# Patient Record
Sex: Male | Born: 1965 | Race: White | Hispanic: No | Marital: Single
Health system: Midwestern US, Community
[De-identification: ages and names within clinical notes are randomized; demographics above are authoritative.]

## PROBLEM LIST (undated history)

## (undated) DIAGNOSIS — G250 Essential tremor: Secondary | ICD-10-CM

## (undated) DIAGNOSIS — R5382 Chronic fatigue, unspecified: Secondary | ICD-10-CM

## (undated) DIAGNOSIS — C6290 Malignant neoplasm of unspecified testis, unspecified whether descended or undescended: Secondary | ICD-10-CM

## (undated) DIAGNOSIS — F419 Anxiety disorder, unspecified: Secondary | ICD-10-CM

## (undated) DIAGNOSIS — F32A Depression, unspecified: Secondary | ICD-10-CM

## (undated) DIAGNOSIS — K828 Other specified diseases of gallbladder: Secondary | ICD-10-CM

## (undated) DIAGNOSIS — F329 Major depressive disorder, single episode, unspecified: Secondary | ICD-10-CM

## (undated) DIAGNOSIS — K219 Gastro-esophageal reflux disease without esophagitis: Secondary | ICD-10-CM

## (undated) DIAGNOSIS — R42 Dizziness and giddiness: Secondary | ICD-10-CM

## (undated) DIAGNOSIS — G479 Sleep disorder, unspecified: Secondary | ICD-10-CM

## (undated) DIAGNOSIS — E038 Other specified hypothyroidism: Secondary | ICD-10-CM

## (undated) DIAGNOSIS — N186 End stage renal disease: Secondary | ICD-10-CM

## (undated) DIAGNOSIS — Z992 Dependence on renal dialysis: Secondary | ICD-10-CM

## (undated) DIAGNOSIS — J90 Pleural effusion, not elsewhere classified: Secondary | ICD-10-CM

## (undated) DIAGNOSIS — R748 Abnormal levels of other serum enzymes: Secondary | ICD-10-CM

## (undated) DIAGNOSIS — E034 Atrophy of thyroid (acquired): Secondary | ICD-10-CM

## (undated) DIAGNOSIS — G929 Unspecified toxic encephalopathy: Secondary | ICD-10-CM

## (undated) DIAGNOSIS — D631 Anemia in chronic kidney disease: Secondary | ICD-10-CM

## (undated) HISTORY — DX: Depression, unspecified: F32.A

## (undated) HISTORY — DX: Malignant neoplasm of unspecified testis, unspecified whether descended or undescended: C62.90

## (undated) HISTORY — DX: Major depressive disorder, single episode, unspecified: F32.9

## (undated) HISTORY — DX: Gastro-esophageal reflux disease without esophagitis: K21.9

## (undated) HISTORY — DX: Chronic fatigue, unspecified: R53.82

## (undated) HISTORY — DX: Anxiety disorder, unspecified: F41.9

## (undated) HISTORY — DX: Essential tremor: G25.0

---
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
## 2000-08-15 HISTORY — PX: ORCHIECTOMY: SHX2116

## 2000-09-05 ENCOUNTER — Encounter: Payer: Self-pay | Admitting: Urology

## 2000-09-07 ENCOUNTER — Ambulatory Visit (HOSPITAL_COMMUNITY): Admission: RE | Admit: 2000-09-07 | Discharge: 2000-09-07 | Payer: Self-pay | Admitting: Urology

## 2000-09-07 ENCOUNTER — Encounter (INDEPENDENT_AMBULATORY_CARE_PROVIDER_SITE_OTHER): Payer: Self-pay | Admitting: Specialist

## 2000-09-11 ENCOUNTER — Encounter: Admission: RE | Admit: 2000-09-11 | Discharge: 2000-09-11 | Payer: Self-pay | Admitting: Urology

## 2000-09-11 ENCOUNTER — Encounter: Payer: Self-pay | Admitting: Urology

## 2000-09-19 ENCOUNTER — Encounter: Payer: Self-pay | Admitting: Hematology & Oncology

## 2000-09-19 ENCOUNTER — Inpatient Hospital Stay (HOSPITAL_COMMUNITY): Admission: AD | Admit: 2000-09-19 | Discharge: 2000-09-28 | Payer: Self-pay | Admitting: Hematology & Oncology

## 2000-09-20 ENCOUNTER — Encounter: Payer: Self-pay | Admitting: Hematology & Oncology

## 2000-10-09 ENCOUNTER — Inpatient Hospital Stay (HOSPITAL_COMMUNITY): Admission: AD | Admit: 2000-10-09 | Discharge: 2000-10-14 | Payer: Self-pay | Admitting: Hematology & Oncology

## 2000-10-09 ENCOUNTER — Encounter: Payer: Self-pay | Admitting: Hematology & Oncology

## 2000-10-24 ENCOUNTER — Encounter: Payer: Self-pay | Admitting: Hematology & Oncology

## 2000-10-24 ENCOUNTER — Encounter: Admission: RE | Admit: 2000-10-24 | Discharge: 2000-10-24 | Payer: Self-pay | Admitting: Hematology & Oncology

## 2000-11-13 ENCOUNTER — Encounter: Admission: RE | Admit: 2000-11-13 | Discharge: 2000-11-13 | Payer: Self-pay | Admitting: Hematology & Oncology

## 2000-11-13 ENCOUNTER — Encounter: Payer: Self-pay | Admitting: Hematology & Oncology

## 2000-12-06 ENCOUNTER — Encounter: Admission: RE | Admit: 2000-12-06 | Discharge: 2000-12-06 | Payer: Self-pay | Admitting: Diagnostic Radiology

## 2000-12-14 ENCOUNTER — Encounter: Payer: Self-pay | Admitting: Hematology & Oncology

## 2000-12-14 ENCOUNTER — Encounter: Admission: RE | Admit: 2000-12-14 | Discharge: 2000-12-14 | Payer: Self-pay | Admitting: Hematology & Oncology

## 2001-01-17 ENCOUNTER — Ambulatory Visit (HOSPITAL_COMMUNITY): Admission: RE | Admit: 2001-01-17 | Discharge: 2001-01-17 | Payer: Self-pay | Admitting: Hematology & Oncology

## 2001-01-17 ENCOUNTER — Encounter: Payer: Self-pay | Admitting: Hematology & Oncology

## 2001-02-07 ENCOUNTER — Encounter: Admission: RE | Admit: 2001-02-07 | Discharge: 2001-02-07 | Payer: Self-pay | Admitting: Hematology & Oncology

## 2001-02-07 ENCOUNTER — Encounter: Payer: Self-pay | Admitting: Hematology & Oncology

## 2001-04-09 ENCOUNTER — Encounter: Admission: RE | Admit: 2001-04-09 | Discharge: 2001-04-09 | Payer: Self-pay | Admitting: Family Medicine

## 2001-04-09 ENCOUNTER — Encounter: Payer: Self-pay | Admitting: Hematology & Oncology

## 2001-06-19 ENCOUNTER — Encounter: Payer: Self-pay | Admitting: Hematology & Oncology

## 2001-06-19 ENCOUNTER — Encounter: Admission: RE | Admit: 2001-06-19 | Discharge: 2001-06-19 | Payer: Self-pay | Admitting: Hematology & Oncology

## 2001-09-26 ENCOUNTER — Encounter: Admission: RE | Admit: 2001-09-26 | Discharge: 2001-09-26 | Payer: Self-pay | Admitting: Hematology & Oncology

## 2001-09-26 ENCOUNTER — Encounter: Payer: Self-pay | Admitting: Hematology & Oncology

## 2001-11-14 ENCOUNTER — Encounter: Payer: Self-pay | Admitting: Hematology & Oncology

## 2001-11-14 ENCOUNTER — Encounter: Admission: RE | Admit: 2001-11-14 | Discharge: 2001-11-14 | Payer: Self-pay | Admitting: Hematology & Oncology

## 2002-02-12 ENCOUNTER — Encounter: Admission: RE | Admit: 2002-02-12 | Discharge: 2002-02-12 | Payer: Self-pay | Admitting: Hematology & Oncology

## 2002-02-12 ENCOUNTER — Encounter: Payer: Self-pay | Admitting: Hematology & Oncology

## 2002-05-13 ENCOUNTER — Encounter: Payer: Self-pay | Admitting: Hematology & Oncology

## 2002-05-13 ENCOUNTER — Encounter: Admission: RE | Admit: 2002-05-13 | Discharge: 2002-05-13 | Payer: Self-pay | Admitting: Hematology & Oncology

## 2002-09-24 ENCOUNTER — Encounter: Admission: RE | Admit: 2002-09-24 | Discharge: 2002-09-24 | Payer: Self-pay | Admitting: Hematology & Oncology

## 2002-09-24 ENCOUNTER — Encounter: Payer: Self-pay | Admitting: Hematology & Oncology

## 2003-01-21 ENCOUNTER — Encounter: Payer: Self-pay | Admitting: Hematology & Oncology

## 2003-01-21 ENCOUNTER — Encounter: Admission: RE | Admit: 2003-01-21 | Discharge: 2003-01-21 | Payer: Self-pay | Admitting: Hematology & Oncology

## 2003-07-29 ENCOUNTER — Encounter: Admission: RE | Admit: 2003-07-29 | Discharge: 2003-07-29 | Payer: Self-pay | Admitting: Hematology & Oncology

## 2003-08-09 ENCOUNTER — Emergency Department (HOSPITAL_COMMUNITY): Admission: EM | Admit: 2003-08-09 | Discharge: 2003-08-10 | Payer: Self-pay | Admitting: Emergency Medicine

## 2004-01-27 ENCOUNTER — Encounter: Admission: RE | Admit: 2004-01-27 | Discharge: 2004-01-27 | Payer: Self-pay | Admitting: Hematology & Oncology

## 2004-07-28 ENCOUNTER — Ambulatory Visit: Payer: Self-pay | Admitting: Hematology & Oncology

## 2004-07-29 ENCOUNTER — Ambulatory Visit (HOSPITAL_COMMUNITY): Admission: RE | Admit: 2004-07-29 | Discharge: 2004-07-29 | Payer: Self-pay | Admitting: Hematology & Oncology

## 2004-09-30 ENCOUNTER — Ambulatory Visit: Payer: Self-pay | Admitting: Hematology & Oncology

## 2004-10-05 ENCOUNTER — Ambulatory Visit (HOSPITAL_COMMUNITY): Admission: RE | Admit: 2004-10-05 | Discharge: 2004-10-05 | Payer: Self-pay | Admitting: Hematology & Oncology

## 2005-02-01 ENCOUNTER — Ambulatory Visit: Payer: Self-pay | Admitting: Hematology & Oncology

## 2005-02-02 ENCOUNTER — Ambulatory Visit (HOSPITAL_COMMUNITY): Admission: RE | Admit: 2005-02-02 | Discharge: 2005-02-02 | Payer: Self-pay | Admitting: Hematology & Oncology

## 2005-05-27 ENCOUNTER — Emergency Department (HOSPITAL_COMMUNITY): Admission: EM | Admit: 2005-05-27 | Discharge: 2005-05-27 | Payer: Self-pay | Admitting: Emergency Medicine

## 2005-08-02 ENCOUNTER — Ambulatory Visit: Payer: Self-pay | Admitting: Hematology & Oncology

## 2005-08-09 ENCOUNTER — Ambulatory Visit (HOSPITAL_COMMUNITY): Admission: RE | Admit: 2005-08-09 | Discharge: 2005-08-09 | Payer: Self-pay | Admitting: Hematology & Oncology

## 2005-11-15 ENCOUNTER — Ambulatory Visit: Payer: Self-pay | Admitting: Internal Medicine

## 2005-12-12 ENCOUNTER — Ambulatory Visit: Payer: Self-pay | Admitting: Internal Medicine

## 2005-12-15 ENCOUNTER — Ambulatory Visit: Payer: Self-pay | Admitting: Internal Medicine

## 2005-12-19 ENCOUNTER — Ambulatory Visit: Payer: Self-pay | Admitting: Internal Medicine

## 2005-12-22 ENCOUNTER — Ambulatory Visit: Payer: Self-pay | Admitting: Internal Medicine

## 2005-12-29 ENCOUNTER — Ambulatory Visit: Payer: Self-pay | Admitting: Internal Medicine

## 2006-02-08 ENCOUNTER — Ambulatory Visit: Payer: Self-pay | Admitting: Hematology & Oncology

## 2006-02-16 LAB — CBC WITH DIFFERENTIAL/PLATELET
BASO%: 0.6 % (ref 0.0–2.0)
EOS%: 1.2 % (ref 0.0–7.0)
HCT: 43.6 % (ref 38.7–49.9)
MCHC: 34.7 g/dL (ref 32.0–35.9)
MONO#: 0.4 10*3/uL (ref 0.1–0.9)
RBC: 4.67 10*6/uL (ref 4.20–5.71)
RDW: 14.2 % (ref 11.2–14.6)
WBC: 3.1 10*3/uL — ABNORMAL LOW (ref 4.0–10.0)
lymph#: 0.7 10*3/uL — ABNORMAL LOW (ref 0.9–3.3)

## 2006-02-18 LAB — COMPREHENSIVE METABOLIC PANEL
ALT: 24 U/L (ref 0–40)
AST: 19 U/L (ref 0–37)
CO2: 24 mEq/L (ref 19–32)
Calcium: 9.3 mg/dL (ref 8.4–10.5)
Chloride: 102 mEq/L (ref 96–112)
Potassium: 4.1 mEq/L (ref 3.5–5.3)
Sodium: 137 mEq/L (ref 135–145)
Total Protein: 7.1 g/dL (ref 6.0–8.3)

## 2006-02-18 LAB — LACTATE DEHYDROGENASE: LDH: 159 U/L (ref 94–250)

## 2006-02-20 ENCOUNTER — Ambulatory Visit (HOSPITAL_COMMUNITY): Admission: RE | Admit: 2006-02-20 | Discharge: 2006-02-20 | Payer: Self-pay | Admitting: Hematology & Oncology

## 2006-07-22 IMAGING — CT CT CHEST W/ CM
1 of 4 series · 14 of 30 positions shown, 18 images · IV contrast (omnipaque)
Comparison: 08/09/2005

CHEST CT WITH CONTRAST

CLINICAL DATA: Testicular cancer
TECHNIQUE: Multidetector CT imaging of the chest, abdomen, and pelvis was
performed following the standard protocol during bolus administration of
intravenous contrast.

Contrast:  125 cc Omnipaque 300

[Series 2: cap 5.0 b40f st · axial · 0.76mm/px · z∈[-634,-24]mm · 14 of 144 slices shown, 18 images]
[im 11/144  mediastinal]
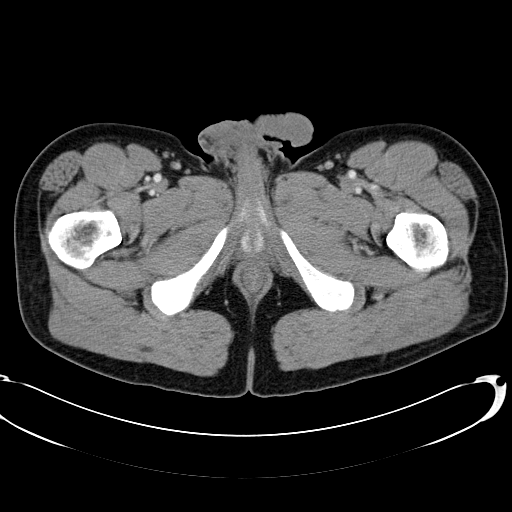
[im 11/144  lung]
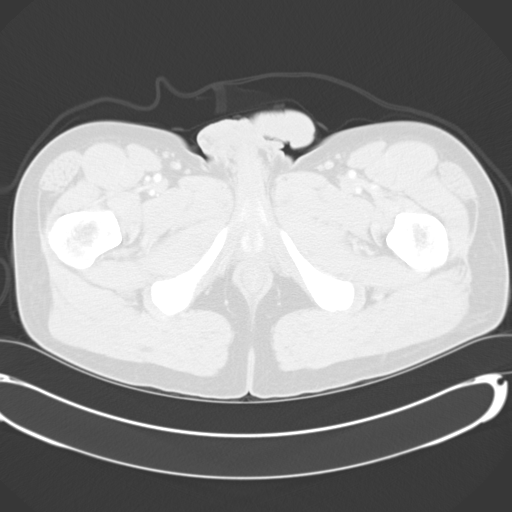
[im 21/144  lung]
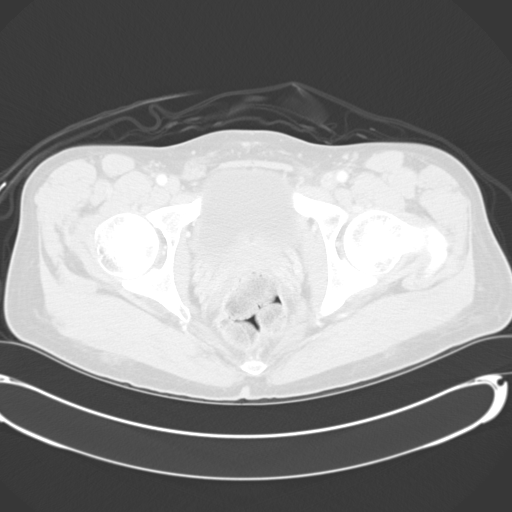
[im 31/144  lung]
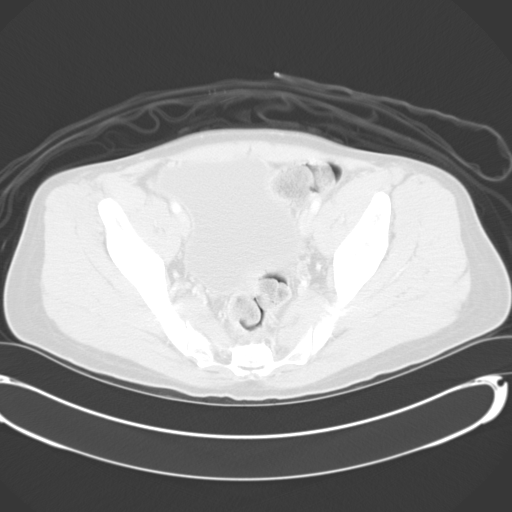
[im 41/144  lung]
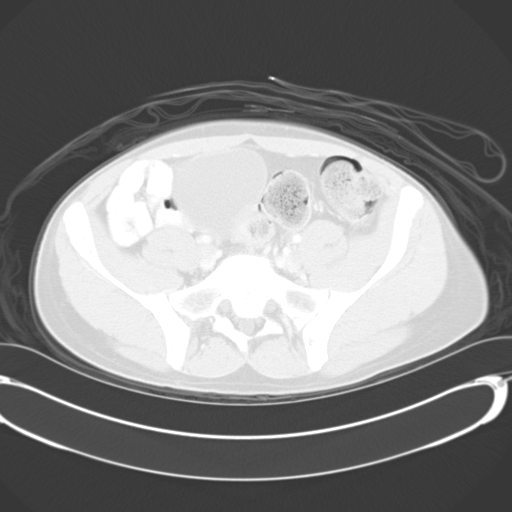
[im 52/144  mediastinal]
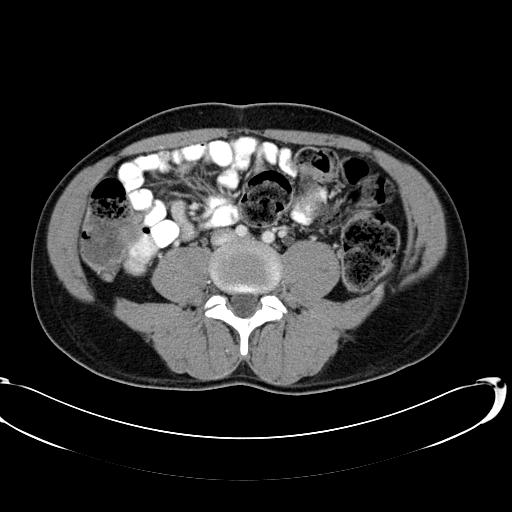
[im 52/144  lung]
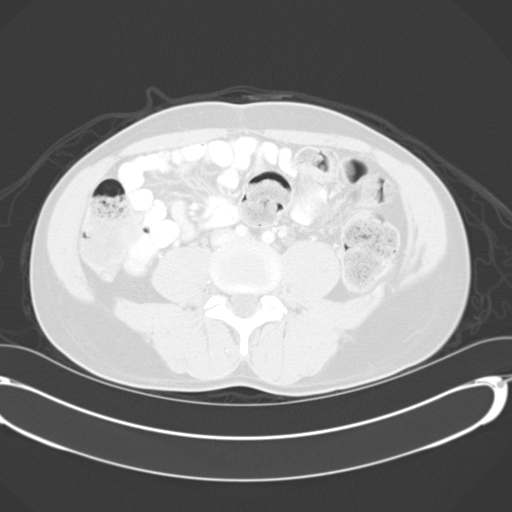
[im 62/144  lung]
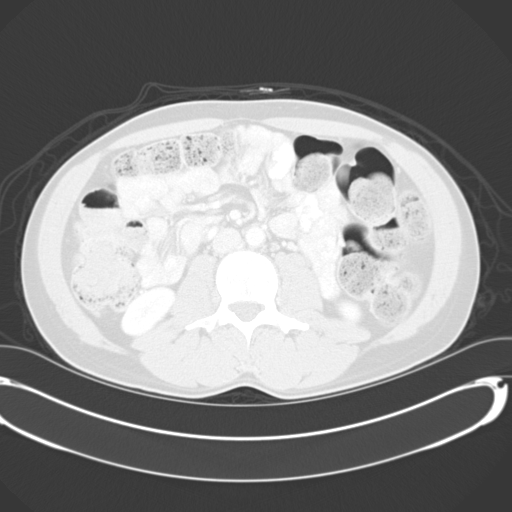
[im 71/144  lung]
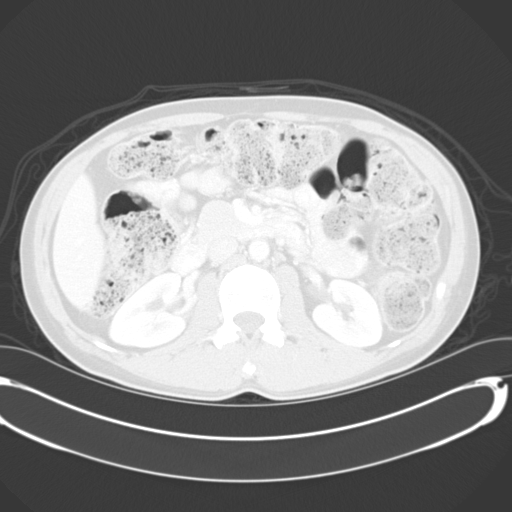
[im 72/144  lung]
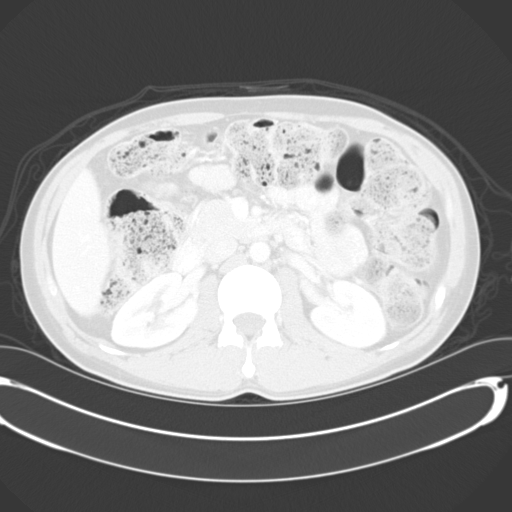
[im 82/144  mediastinal]
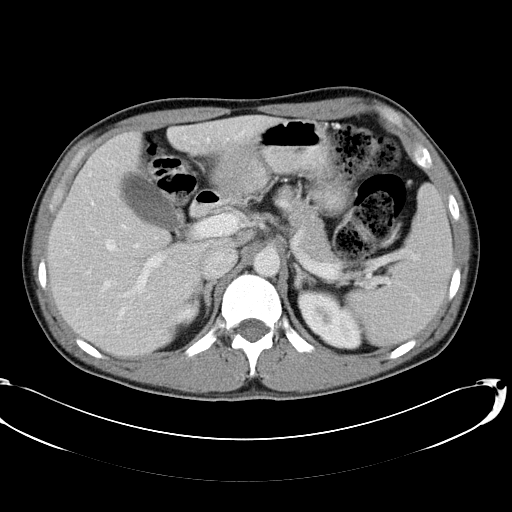
[im 82/144  lung]
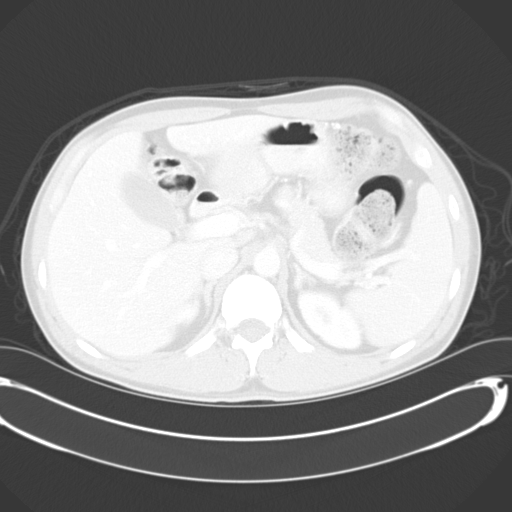
[im 92/144  lung]
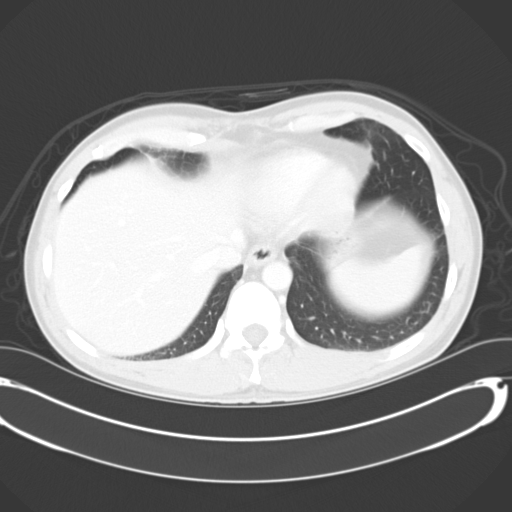
[im 103/144  lung]
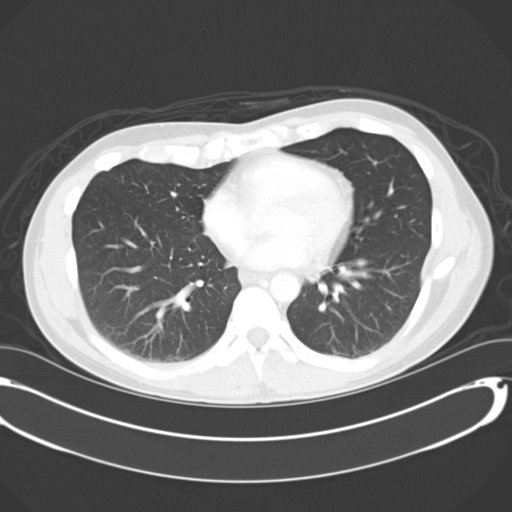
[im 113/144  lung]
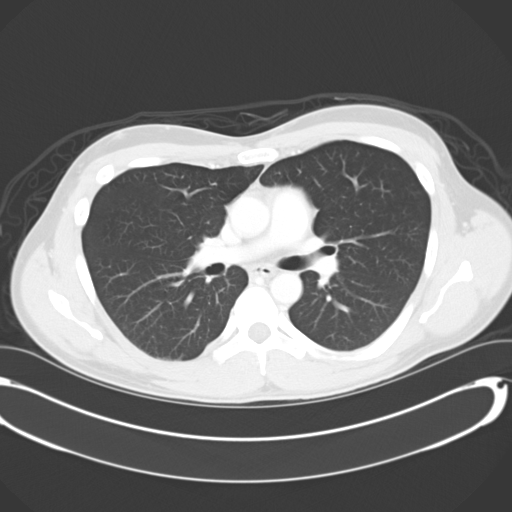
[im 123/144  mediastinal]
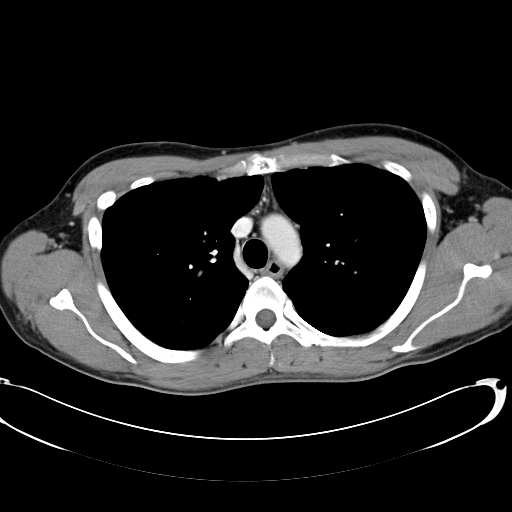
[im 123/144  lung]
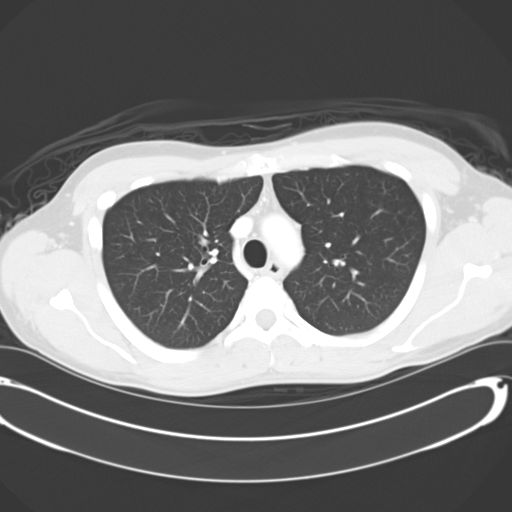
[im 133/144  lung]
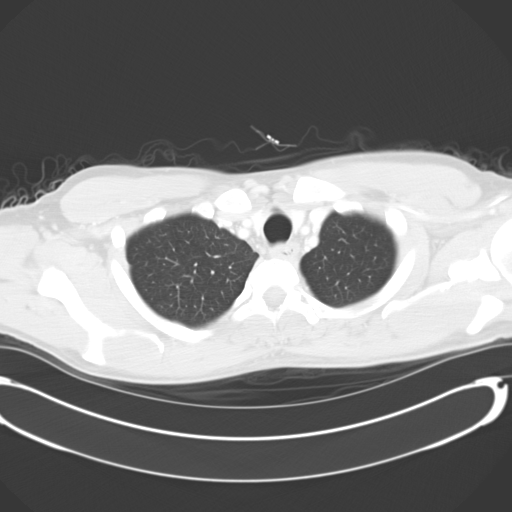

[14 of 30 positions shown; findings below may reference images not displayed]

FINDINGS: The lungs are clear. No suspicious pulmonary nodules or masses. No
effusions. No mediastinal, hilar, or axillary adenopathy. Heart and great
vessels are normal size and anatomic configuration. Visualized skeleton
unremarkable.

IMPRESSION

No acute findings. No evidence of metastatic disease.

ABDOMEN CT WITH CONTRAST
FINDINGS: Liver, spleen, pancreas, adrenals, kidneys, gallbladder are
unremarkable. Constipation noted. Bowel otherwise grossly unremarkable. No
adenopathy, free fluid, or free air. Visualized skeleton unremarkable.
Incidentally noted is a circumaortic left renal vein.

IMPRESSION

No acute findings or evidence of metastatic disease.

PELVIS CT WITH CONTRAST
FINDINGS: Constipation. Bladder mildly distended. No free fluid, free air, or
adenopathy. Bony structures unremarkable.

IMPRESSION

No acute findings or evidence of metastatic disease.

## 2006-08-21 ENCOUNTER — Ambulatory Visit: Payer: Self-pay | Admitting: Hematology & Oncology

## 2006-08-23 LAB — CBC WITH DIFFERENTIAL/PLATELET
Eosinophils Absolute: 0.1 10*3/uL (ref 0.0–0.5)
HCT: 43.1 % (ref 38.7–49.9)
LYMPH%: 27.8 % (ref 14.0–48.0)
MCHC: 34.8 g/dL (ref 32.0–35.9)
MCV: 91.9 fL (ref 81.6–98.0)
MONO#: 0.4 10*3/uL (ref 0.1–0.9)
MONO%: 11.4 % (ref 0.0–13.0)
NEUT#: 1.9 10*3/uL (ref 1.5–6.5)
NEUT%: 58 % (ref 40.0–75.0)
Platelets: 181 10*3/uL (ref 145–400)
RBC: 4.7 10*6/uL (ref 4.20–5.71)

## 2006-08-25 LAB — COMPREHENSIVE METABOLIC PANEL
ALT: 24 U/L (ref 0–53)
CO2: 26 mEq/L (ref 19–32)
Calcium: 9.4 mg/dL (ref 8.4–10.5)
Chloride: 104 mEq/L (ref 96–112)
Sodium: 141 mEq/L (ref 135–145)
Total Protein: 7 g/dL (ref 6.0–8.3)

## 2006-08-25 LAB — LACTATE DEHYDROGENASE: LDH: 164 U/L (ref 94–250)

## 2006-08-25 LAB — BETA HCG QUANT (REF LAB): Beta hCG, Tumor Marker: <0.5 m[IU]/mL (ref <5.0)

## 2006-08-25 LAB — AFP TUMOR MARKER: AFP-Tumor Marker: 6.4 ng/mL (ref 0.0–8.0)

## 2007-02-19 ENCOUNTER — Ambulatory Visit: Payer: Self-pay | Admitting: Hematology & Oncology

## 2007-02-21 LAB — CBC WITH DIFFERENTIAL/PLATELET
BASO%: 0.6 % (ref 0.0–2.0)
Basophils Absolute: 0 10*3/uL (ref 0.0–0.1)
EOS%: 2 % (ref 0.0–7.0)
MCH: 31.9 pg (ref 28.0–33.4)
MCHC: 35.1 g/dL (ref 32.0–35.9)
MCV: 90.7 fL (ref 81.6–98.0)
MONO%: 12.8 % (ref 0.0–13.0)
NEUT%: 58.2 % (ref 40.0–75.0)
RDW: 13 % (ref 11.2–14.6)
lymph#: 1 10*3/uL (ref 0.9–3.3)

## 2007-02-23 LAB — COMPREHENSIVE METABOLIC PANEL
ALT: 23 U/L (ref 0–53)
AST: 19 U/L (ref 0–37)
Alkaline Phosphatase: 90 U/L (ref 39–117)
BUN: 18 mg/dL (ref 6–23)
Calcium: 9.7 mg/dL (ref 8.4–10.5)
Chloride: 104 mEq/L (ref 96–112)
Creatinine, Ser: 1.12 mg/dL (ref 0.40–1.50)

## 2007-02-23 LAB — BETA HCG QUANT (REF LAB): Beta hCG, Tumor Marker: <0.5 m[IU]/mL (ref <5.0)

## 2007-02-26 LAB — VITAMIN D PNL(25-HYDRXY+1,25-DIHY)-BLD
Vit D, 1,25-Dihydroxy: 59 pg/mL (ref 6–62)
Vit D, 25-Hydroxy: 42 ng/mL (ref 20–57)

## 2007-06-18 ENCOUNTER — Ambulatory Visit: Payer: Self-pay | Admitting: Cardiology

## 2007-06-27 DIAGNOSIS — F429 Obsessive-compulsive disorder, unspecified: Secondary | ICD-10-CM | POA: Insufficient documentation

## 2007-06-27 DIAGNOSIS — F329 Major depressive disorder, single episode, unspecified: Secondary | ICD-10-CM | POA: Insufficient documentation

## 2007-06-27 DIAGNOSIS — R5382 Chronic fatigue, unspecified: Secondary | ICD-10-CM | POA: Insufficient documentation

## 2007-06-27 DIAGNOSIS — R519 Headache, unspecified: Secondary | ICD-10-CM | POA: Insufficient documentation

## 2007-06-27 DIAGNOSIS — R51 Headache: Secondary | ICD-10-CM | POA: Insufficient documentation

## 2007-08-21 ENCOUNTER — Ambulatory Visit: Payer: Self-pay | Admitting: Hematology & Oncology

## 2007-08-23 LAB — CBC WITH DIFFERENTIAL/PLATELET
BASO%: 0 % (ref 0.0–2.0)
Eosinophils Absolute: 0.1 10*3/uL (ref 0.0–0.5)
HCT: 43 % (ref 38.7–49.9)
LYMPH%: 35.6 % (ref 14.0–48.0)
MONO#: 0.4 10*3/uL (ref 0.1–0.9)
NEUT#: 1.6 10*3/uL (ref 1.5–6.5)
Platelets: 162 10*3/uL (ref 145–400)
RBC: 4.79 10*6/uL (ref 4.20–5.71)
WBC: 3.3 10*3/uL — ABNORMAL LOW (ref 4.0–10.0)
lymph#: 1.2 10*3/uL (ref 0.9–3.3)

## 2007-08-25 LAB — COMPREHENSIVE METABOLIC PANEL
ALT: 32 U/L (ref 0–53)
Albumin: 4.6 g/dL (ref 3.5–5.2)
CO2: 24 mEq/L (ref 19–32)
Calcium: 9.6 mg/dL (ref 8.4–10.5)
Chloride: 104 mEq/L (ref 96–112)
Glucose, Bld: 66 mg/dL — ABNORMAL LOW (ref 70–99)
Sodium: 139 mEq/L (ref 135–145)
Total Bilirubin: 0.4 mg/dL (ref 0.3–1.2)
Total Protein: 6.8 g/dL (ref 6.0–8.3)

## 2007-08-25 LAB — AFP TUMOR MARKER: AFP-Tumor Marker: 6.5 ng/mL (ref 0.0–8.0)

## 2007-08-25 LAB — LACTATE DEHYDROGENASE: LDH: 160 U/L (ref 94–250)

## 2007-08-28 LAB — VITAMIN D PNL(25-HYDRXY+1,25-DIHY)-BLD: Vit D, 25-Hydroxy: 52 ng/mL (ref 30–89)

## 2007-08-30 ENCOUNTER — Encounter: Payer: Self-pay | Admitting: Internal Medicine

## 2008-03-09 ENCOUNTER — Ambulatory Visit: Payer: Self-pay | Admitting: Hematology & Oncology

## 2008-03-13 LAB — CBC WITH DIFFERENTIAL/PLATELET
BASO%: 0.3 % (ref 0.0–2.0)
EOS%: 2.5 % (ref 0.0–7.0)
LYMPH%: 38.7 % (ref 14.0–48.0)
MCH: 31.8 pg (ref 28.0–33.4)
MCHC: 34.8 g/dL (ref 32.0–35.9)
MONO#: 0.5 10*3/uL (ref 0.1–0.9)
MONO%: 14.3 % — ABNORMAL HIGH (ref 0.0–13.0)
Platelets: 138 10*3/uL — ABNORMAL LOW (ref 145–400)
RBC: 4.44 10*6/uL (ref 4.20–5.71)
WBC: 3.6 10*3/uL — ABNORMAL LOW (ref 4.0–10.0)

## 2008-03-17 LAB — LACTATE DEHYDROGENASE: LDH: 156 U/L (ref 94–250)

## 2008-03-17 LAB — COMPREHENSIVE METABOLIC PANEL
ALT: 52 U/L (ref 0–53)
BUN: 15 mg/dL (ref 6–23)
CO2: 23 mEq/L (ref 19–32)
Calcium: 9.3 mg/dL (ref 8.4–10.5)
Chloride: 104 mEq/L (ref 96–112)
Creatinine, Ser: 1 mg/dL (ref 0.40–1.50)

## 2008-03-18 ENCOUNTER — Ambulatory Visit: Payer: Self-pay | Admitting: Hematology & Oncology

## 2008-03-20 ENCOUNTER — Encounter: Payer: Self-pay | Admitting: Internal Medicine

## 2008-09-08 ENCOUNTER — Ambulatory Visit: Payer: Self-pay | Admitting: Hematology & Oncology

## 2008-09-10 LAB — CBC WITH DIFFERENTIAL/PLATELET
BASO%: 0.4 % (ref 0.0–2.0)
Basophils Absolute: 0 10*3/uL (ref 0.0–0.1)
HCT: 41.1 % (ref 38.7–49.9)
HGB: 14.1 g/dL (ref 13.0–17.1)
MONO#: 0.4 10*3/uL (ref 0.1–0.9)
NEUT#: 1.5 10*3/uL (ref 1.5–6.5)
NEUT%: 53.6 % (ref 40.0–75.0)
WBC: 2.8 10*3/uL — ABNORMAL LOW (ref 4.0–10.0)
lymph#: 0.9 10*3/uL (ref 0.9–3.3)

## 2008-09-14 LAB — COMPREHENSIVE METABOLIC PANEL WITH GFR
ALT: 26 U/L (ref 0–53)
AST: 26 U/L (ref 0–37)
Albumin: 4.4 g/dL (ref 3.5–5.2)
Alkaline Phosphatase: 111 U/L (ref 39–117)
BUN: 18 mg/dL (ref 6–23)
CO2: 24 meq/L (ref 19–32)
Calcium: 9.4 mg/dL (ref 8.4–10.5)
Chloride: 104 meq/L (ref 96–112)
Creatinine, Ser: 1.07 mg/dL (ref 0.40–1.50)
Glucose, Bld: 70 mg/dL (ref 70–99)
Potassium: 4.2 meq/L (ref 3.5–5.3)
Sodium: 139 meq/L (ref 135–145)
Total Bilirubin: 0.3 mg/dL (ref 0.3–1.2)
Total Protein: 6.6 g/dL (ref 6.0–8.3)

## 2008-09-14 LAB — BETA HCG QUANT (REF LAB): Beta hCG, Tumor Marker: <0.5 m[IU]/mL (ref <5.0)

## 2008-09-14 LAB — AFP TUMOR MARKER: AFP-Tumor Marker: 6.4 ng/mL (ref 0.0–8.0)

## 2008-09-14 LAB — LACTATE DEHYDROGENASE: LDH: 155 U/L (ref 94–250)

## 2008-09-14 LAB — LIPID PANEL: Cholesterol: 161 mg/dL (ref 0–200)

## 2008-09-16 ENCOUNTER — Ambulatory Visit: Payer: Self-pay | Admitting: Hematology & Oncology

## 2008-09-17 ENCOUNTER — Encounter: Payer: Self-pay | Admitting: Internal Medicine

## 2009-01-23 ENCOUNTER — Ambulatory Visit: Admission: RE | Admit: 2009-01-23 | Discharge: 2009-01-23 | Payer: Self-pay | Admitting: Internal Medicine

## 2009-03-24 ENCOUNTER — Ambulatory Visit: Payer: Self-pay | Admitting: Hematology

## 2009-03-26 LAB — CBC WITH DIFFERENTIAL/PLATELET
Basophils Absolute: 0 10*3/uL (ref 0.0–0.1)
Eosinophils Absolute: 0 10*3/uL (ref 0.0–0.5)
HCT: 40.7 % (ref 38.4–49.9)
HGB: 14 g/dL (ref 13.0–17.1)
MCV: 90.8 fL (ref 79.3–98.0)
MONO%: 12.9 % (ref 0.0–14.0)
NEUT#: 1.9 10*3/uL (ref 1.5–6.5)
NEUT%: 55.1 % (ref 39.0–75.0)
RDW: 13.8 % (ref 11.0–14.6)

## 2009-03-28 LAB — PSA: PSA: 0.62 ng/mL (ref 0.10–4.00)

## 2009-03-28 LAB — COMPREHENSIVE METABOLIC PANEL
ALT: 27 U/L (ref 0–53)
BUN: 12 mg/dL (ref 6–23)
CO2: 24 mEq/L (ref 19–32)
Calcium: 8.7 mg/dL (ref 8.4–10.5)
Chloride: 104 mEq/L (ref 96–112)
Creatinine, Ser: 1.15 mg/dL (ref 0.40–1.50)
Glucose, Bld: 144 mg/dL — ABNORMAL HIGH (ref 70–99)

## 2009-03-28 LAB — AFP TUMOR MARKER: AFP-Tumor Marker: 5.9 ng/mL (ref 0.0–8.0)

## 2009-03-28 LAB — LIPID PANEL
Cholesterol: 173 mg/dL (ref 0–200)
HDL: 47 mg/dL (ref >39)
Triglycerides: 120 mg/dL (ref <150)

## 2009-04-01 ENCOUNTER — Ambulatory Visit: Payer: Self-pay | Admitting: Hematology & Oncology

## 2009-04-02 ENCOUNTER — Encounter: Payer: Self-pay | Admitting: Internal Medicine

## 2009-09-22 ENCOUNTER — Ambulatory Visit: Payer: Self-pay | Admitting: Hematology

## 2009-09-24 LAB — CBC WITH DIFFERENTIAL/PLATELET
EOS%: 1.7 % (ref 0.0–7.0)
MCH: 31.7 pg (ref 27.2–33.4)
MCV: 90.1 fL (ref 79.3–98.0)
MONO%: 12.4 % (ref 0.0–14.0)
RBC: 4.4 10*6/uL (ref 4.20–5.82)
RDW: 14 % (ref 11.0–14.6)

## 2009-09-26 LAB — COMPREHENSIVE METABOLIC PANEL
ALT: 29 U/L (ref 0–53)
BUN: 15 mg/dL (ref 6–23)
Calcium: 9.1 mg/dL (ref 8.4–10.5)
Potassium: 4.3 mEq/L (ref 3.5–5.3)
Sodium: 138 mEq/L (ref 135–145)
Total Bilirubin: 0.3 mg/dL (ref 0.3–1.2)

## 2009-09-26 LAB — LIPID PANEL
LDL Cholesterol: 99 mg/dL (ref 0–99)
Triglycerides: 90 mg/dL (ref <150)

## 2009-09-26 LAB — VITAMIN D 25 HYDROXY (VIT D DEFICIENCY, FRACTURES): Vit D, 25-Hydroxy: 27 ng/mL — ABNORMAL LOW (ref 30–89)

## 2009-09-26 LAB — BETA HCG QUANT (REF LAB): Beta hCG, Tumor Marker: <0.5 m[IU]/mL (ref <5.0)

## 2009-09-26 LAB — AFP TUMOR MARKER: AFP-Tumor Marker: 7 ng/mL (ref 0.0–8.0)

## 2009-09-26 LAB — LACTATE DEHYDROGENASE: LDH: 166 U/L (ref 94–250)

## 2009-09-29 ENCOUNTER — Ambulatory Visit: Payer: Self-pay | Admitting: Hematology & Oncology

## 2010-03-30 ENCOUNTER — Ambulatory Visit: Payer: Self-pay | Admitting: Hematology & Oncology

## 2010-04-01 LAB — CBC WITH DIFFERENTIAL/PLATELET
BASO%: 0.4 % (ref 0.0–2.0)
Basophils Absolute: 0 10*3/uL (ref 0.0–0.1)
EOS%: 2.1 % (ref 0.0–7.0)
Eosinophils Absolute: 0.1 10*3/uL (ref 0.0–0.5)
HCT: 40.8 % (ref 38.4–49.9)
HGB: 14.3 g/dL (ref 13.0–17.1)
LYMPH%: 32.1 % (ref 14.0–49.0)
MCH: 31.5 pg (ref 27.2–33.4)
MCHC: 35.1 g/dL (ref 32.0–36.0)
MCV: 89.7 fL (ref 79.3–98.0)
MONO#: 0.4 10*3/uL (ref 0.1–0.9)
MONO%: 13.7 % (ref 0.0–14.0)
NEUT#: 1.5 10*3/uL (ref 1.5–6.5)
NEUT%: 51.7 % (ref 39.0–75.0)
Platelets: 144 10*3/uL (ref 140–400)
RBC: 4.55 10*6/uL (ref 4.20–5.82)
RDW: 13.7 % (ref 11.0–14.6)
WBC: 3 10*3/uL — ABNORMAL LOW (ref 4.0–10.3)
lymph#: 1 10*3/uL (ref 0.9–3.3)

## 2010-04-04 LAB — LIPID PANEL
HDL: 50 mg/dL (ref >39)
LDL Cholesterol: 105 mg/dL — ABNORMAL HIGH (ref 0–99)
Total CHOL/HDL Ratio: 3.5 Ratio
VLDL: 18 mg/dL (ref 0–40)

## 2010-04-04 LAB — COMPREHENSIVE METABOLIC PANEL
ALT: 25 U/L (ref 0–53)
AST: 23 U/L (ref 0–37)
Chloride: 105 mEq/L (ref 96–112)
Creatinine, Ser: 1.17 mg/dL (ref 0.40–1.50)
Total Bilirubin: 0.3 mg/dL (ref 0.3–1.2)

## 2010-04-04 LAB — AFP TUMOR MARKER: AFP-Tumor Marker: 5.9 ng/mL (ref 0.0–8.0)

## 2010-04-15 ENCOUNTER — Ambulatory Visit: Payer: Self-pay | Admitting: Hematology & Oncology

## 2010-10-08 ENCOUNTER — Other Ambulatory Visit: Payer: Self-pay | Admitting: Hematology & Oncology

## 2010-10-08 ENCOUNTER — Encounter (HOSPITAL_BASED_OUTPATIENT_CLINIC_OR_DEPARTMENT_OTHER): Payer: BC Managed Care – PPO | Admitting: Oncology

## 2010-10-08 DIAGNOSIS — C6290 Malignant neoplasm of unspecified testis, unspecified whether descended or undescended: Secondary | ICD-10-CM

## 2010-10-08 LAB — CBC WITH DIFFERENTIAL/PLATELET
BASO%: 0.4 % (ref 0.0–2.0)
Basophils Absolute: 0 10*3/uL (ref 0.0–0.1)
Eosinophils Absolute: 0.1 10*3/uL (ref 0.0–0.5)
HCT: 39.9 % (ref 38.4–49.9)
HGB: 13.8 g/dL (ref 13.0–17.1)
LYMPH%: 33.9 % (ref 14.0–49.0)
MCHC: 34.5 g/dL (ref 32.0–36.0)
MONO#: 0.4 10*3/uL (ref 0.1–0.9)
NEUT%: 48.9 % (ref 39.0–75.0)
Platelets: 135 10*3/uL — ABNORMAL LOW (ref 140–400)
WBC: 2.6 10*3/uL — ABNORMAL LOW (ref 4.0–10.3)
lymph#: 0.9 10*3/uL (ref 0.9–3.3)

## 2010-10-13 LAB — AFP TUMOR MARKER: AFP-Tumor Marker: 6.7 ng/mL (ref 0.0–8.0)

## 2010-10-13 LAB — COMPREHENSIVE METABOLIC PANEL
ALT: 36 U/L (ref 0–53)
BUN: 10 mg/dL (ref 6–23)
CO2: 23 mEq/L (ref 19–32)
Calcium: 9.3 mg/dL (ref 8.4–10.5)
Chloride: 103 mEq/L (ref 96–112)
Creatinine, Ser: 1.09 mg/dL (ref 0.40–1.50)
Glucose, Bld: 69 mg/dL — ABNORMAL LOW (ref 70–99)
Total Bilirubin: 0.3 mg/dL (ref 0.3–1.2)

## 2010-10-13 LAB — BETA HCG QUANT (REF LAB): Beta hCG, Tumor Marker: <0.5 m[IU]/mL (ref <5.0)

## 2010-10-13 LAB — LACTATE DEHYDROGENASE: LDH: 172 U/L (ref 94–250)

## 2010-10-15 ENCOUNTER — Encounter (HOSPITAL_BASED_OUTPATIENT_CLINIC_OR_DEPARTMENT_OTHER): Payer: BC Managed Care – PPO | Admitting: Hematology & Oncology

## 2010-10-15 DIAGNOSIS — C6290 Malignant neoplasm of unspecified testis, unspecified whether descended or undescended: Secondary | ICD-10-CM

## 2010-11-22 LAB — BLOOD GAS, VENOUS
Acid-Base Excess: 2.4 mmol/L — ABNORMAL HIGH (ref 0.0–2.0)
Bicarbonate: 27.3 mEq/L — ABNORMAL HIGH (ref 20.0–24.0)
O2 Saturation: 39.2 %
Patient temperature: 98.6
TCO2: 24.5 mmol/L (ref 0–100)
pCO2, Ven: 45.6 mmHg (ref 45.0–50.0)
pH, Ven: 7.395 — ABNORMAL HIGH (ref 7.250–7.300)
pO2, Ven: 23.4 mmHg — CL (ref 30.0–45.0)

## 2010-12-28 NOTE — Assessment & Plan Note (Signed)
[facility]                            CARDIOLOGY OFFICE NOTE   Charles Lowe, Charles Lowe                   MRN:          [id number]  DATE:06/18/2007                            DOB:          Nov 22, 1965    PRIMARY CARE PHYSICIAN:  Dr. Oliver Barre   REASON FOR REFERRAL:  Evaluate the patient with abnormal echocardiogram.   HISTORY OF PRESENT ILLNESS:  The patient is a 45 year old gentleman with  a long history of chronic fatigue syndrome.  He dates this back to 22  years following a viral illness.  He has been followed by a physician in  [place] for years.  This particular physician follows echocardiograms.  He is in particular looking at the isovolumic relaxation.  The patient  explains this in great detail to me.  He uses this as a measure of  energy abnormalities.  The patient reports that the longer your  relaxation time, the more oxygen intolerance one has.  This physician  treats this abnormality with an ointment of bison heart, and slowly this  has improved this patient's isovolumic relaxation time.  However, he has  not had much improvement, if any, in his chronic fatigue.  He is not  sure whether there have been other abnormalities on his serial  echocardiograms which is how this problem is followed.  He wanted a  cardiology opinion on his echocardiograms to see if there were any other  abnormalities.   The patient has an interesting lifestyle.  He works 10 hour days.  He  drinks a gallon of caffeine every day to try to keep up with these days.  He then goes home and sleeps.  He sleeps all weekend long.  He is not  married and has no children.  He has no tolerance for exercise.  He says  he gets dyspneic if he tries to jog.  He can do a little walking.  He  does not report resting shortness of breath, does not have any PND or  orthopnea.  He does not have any palpitations, presyncope, or syncope.  He denies chest discomfort, neck or arm  discomfort.   PAST MEDICAL HISTORY:  1. Low blood sugar.  2. Benign essential tremor.  3. Chronic fatigue syndrome.  4. 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  5. Appendectomy.  6. Testicular torsion.  7. Pectus excavatum correction.   ALLERGIES:  None.   MEDICATIONS:  1. Anafranil 100 mg daily.  2. Klonopin 1.5 mg b.i.d.  3. Lyrica.   SOCIAL HISTORY:  The patient is an Dance movement psychotherapist.  He is single.  He has no  children.  He drinks alcohol socially.  He does not smoke cigarettes.   FAMILY HISTORY:  Noncontributory for early coronary artery disease.   REVIEW OF SYSTEMS:  As stated in the HPI and negative for other systems.   PHYSICAL EXAMINATION:  The patient is in no distress.  Blood pressure 158/88, heart rate 96 and regular.  HEENT:  Eyes unremarkable, pupils equal, round, and reactive to light,  fundi not visualized, oral mucosa unremarkable.  NECK:  No jugular venous distention at 45 degrees, carotid upstroke  brisk and symmetric, no bruits, no thyromegaly.  LYMPHATICS:  No cervical, axillary, or inguinal adenopathy.  LUNGS:  Clear to auscultation bilaterally.  BACK:  No costovertebral angle tenderness.  CHEST:  Unremarkable.  HEART:  PMI not displaced or sustained.  S1 and S2 within normal limits.  No S3, no S4.  No clicks, no rubs, no murmurs.  ABDOMEN:  Flat, positive bowel sounds normal in frequency and pitch.  No  bruits, no rebound, no guarding, no midline pulsatile mass, no  splenomegaly.  SKIN:  No rashes, no nodules.  EXTREMITIES:  2+ pulses throughout, no edema, no cyanosis, no clubbing.  NEURO:  Oriented to person, place, and time.  Cranial nerves 2-12  grossly intact.  Motor grossly intact throughout.   EKG:  Sinus rhythm, rate 96, axis within normal limits, intervals within  normal limits, no acute ST-T wave changes.   ASSESSMENT AND PLAN:  1. Abnormal echocardiogram.  The patient  has an abnormal isovolumic      relaxation time.  I explained to him this is not a parameter we      follow as an isolated abnormality.  I do not know any data      concerning this and chronic fatigue syndrome.  He otherwise has a      normal physical exam and normal EKG.  He has no other symptoms      consistent with a cardiac abnormality other than decreased exercise      tolerance.  He did bring the disks of his echocardiogram.  I will      take the liberty of reviewing these when I can get them up and      running.  2. Hypertension.  His blood pressure is elevated today, but he said      this is a very odd phenomenon.  He has not had this before.      Therefore, he will continue to watch this and let me know if it      stays elevated.  3. Followup will be based on the results of the review of his      echocardiogram.    Rollene Rotunda, MD, [facility]  Electronically Signed   JH/MedQ  DD: 06/18/2007  DT: 06/19/2007  Job #: [id number]   cc:   Corwin Levins, MD

## 2010-12-31 NOTE — Discharge Summary (Signed)
. [facility]  Patient:    Charles Lowe, Charles Lowe               MRN: [id number] Adm. Date:  [id number] Disc. Date: [id number] Attending:  Jim Desanctis                           Discharge Summary  DIAGNOSES UPON DISCHARGE: 1. 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 2. Cycle #2 of chemotherapy with cisplatin/VP-16/bleomycin.  CONDITION ON DISCHARGE:  Stable.  ACTIVITIES:  As tolerated.  DIET:  Without restrictions.  FOLLOW-UP: 1. The patient will follow up in the office on October 19, 2000, for bleomycin    and Procrit. 2. The patient will start following up in [place], [place], November 16, 2000, for Neupogen. 3. I will see the patient back in the office on October 25, 2000.  MEDICATIONS ON DISCHARGE: 1. Zofran 8 mg t.i.d. p.r.n. 2. Marinol 5 mg b.i.d. 3. Coumadin 1 mg q.d. 4. Phenergan 25 mg q.6h. p.r.n. 5. Anafranil 150 mg q.d. 6. Klonopin 0.5 mg q.a.m. and 1 mg q.p.m. 7. Phenergan 20 mg p.o. q.6h. p.r.n.  ADMISSION HISTORY AND PHYSICAL:  [name] was admitted for cycle #2 of chemotherapy.  He had his Port-A-Cath in place.  His body surface area was 1.9 sq m.  He received prehydration and post hydration.  He received antiemetics with Zofran/Phenergan/Ativan.  His chemotherapy was as follows:  Cisplatin 25 mg/sq m per day (45 mg per day on days #1-5), VP-16 (100 mg/sq m per day) 190 mg q.d. on days #1-5, and bleomycin 3 units on day #3 only.  His blood counts were adequate.  I am going to go ahead a put him on Coumadin to help with his Port-A-Cath.  His tumor markers were beta hCG less than 1 and alpha-fetoprotein of 6.  A chest x-ray done on admission did not show any evidence of metastatic disease in the chest.  There was no adenopathy.  There were no pleural effusions.  The EKG showed normal sinus rhythm.  On October 04, 2000, he did go to [facility] for a second opinion.  The second opinion basically agreed with our  approach.  This made [name] feel more comfortable.  HOSPITAL COURSE:  We did get some routine blood work on him on October 12, 2000.  His blood counts were adequate.  He had good renal function.  He had excellent renal output.  His vital signs were stable throughout the entire chemotherapy.  He completed his chemotherapy on the morning of October 14, 2000.  We started him on Neupogen at 480 mcg.  He will continue Neupogen as an outpatient in [place], [place]. DD:  10/13/00 TD:  10/14/00 Job: [id number]

## 2010-12-31 NOTE — Op Note (Signed)
[facility]  Patient:    Charles Lowe, Charles Lowe               MRN: [id number] Proc. Date: 09/07/00 Attending:  Excell Seltzer. Annabell Howells, M.D. CC:         Corwin Levins, M.D. [facility] R. Myna Hidalgo, M.D.   Operative Report  PREOPERATIVE DIAGNOSIS:  Left testicular mass.  POSTOPERATIVE DIAGNOSIS:  Left testicular mass.  OPERATION:  Left radical orchiectomy with insertion of testicular prosthesis.  SURGEON:  Excell Seltzer. Annabell Howells, M.D.  ANESTHESIA:  General.  SPECIMEN:  Left testicle and cord.  IMPLANT:  A 4.2 cm x 3 cm silicone testicular implant.  COMPLICATIONS:  None.  INDICATIONS:  Charles Lowe is a 45 year old white male who had the onset of testicular pain approximately one month ago.  He had had a history of a torsion of right appendix testis as a child requiring treatment at age 62.  His initial presentation was consistent with epididymitis, and he was given a 10-day course of Cipro.  His symptoms improved but then recurred.  He has since felt firmness of the left testicle.  On office evaluation, he had an obvious nodular testicle suspicious for testicular cancer.  Ultrasound confirmed abnormal internal echos also suspicious for tumor.  He is to undergo left radicle orchiectomy with placement of a silicone testicular prosthesis. His preoperative chest x-ray did reveal pulmonary nodules which leads to the suspicion of metastatic disease.  FINDINGS AND PROCEDURE:  The patient was given p.o. Tequin.  He was taken to the operating room where a general anesthetic was induced.  His left inguinal area was shaved.  He was scrubbed with Betadine scrub for five minutes and then prepped with Betadine solution.  He as then draped in the usual sterile fashion.  An incision was made in the left inguinal area approximately 5 cm in length along the skin lines just superolateral to the pubic tubercle.  Once the skin was incised, the subcutaneous tissue was opened with the Bovie.   The superficial vein in the lateral aspect of the incision was controlled with the Bovie.  Scarpas fascia was identified and opened.  The fat was then spread down to expose the external oblique fascia.  The external oblique fascia was then incised along its fibers from the external ring proximally.  The ilioinguinal nerve was identified and protected.  The cord was identified, dissected out, doubly encircled with a Penrose drain to provide vascular occlusion.  The testicle was then delivered from the scrotum.  There was evidence of a small varicocele.  Once the testicle was delivered and all the gubernacular attachments were taken down with blunt dissection with the Bovie, the cord was then dissected more proximally to the internal ring.  The vas was isolated and clamped with a hemostat.  The vascular pedicle was isolated and clamped with a hemostat.  The cord was then divided.  The specimen was passed off the field.  The vas was ligated with a 0 silk tie.  The vascular pedicle was double ligated with 0 silk.  The cut ends were left long to aid later identification if retroperitoneal node dissection is required.  The wound was then irrigated with antibiotic solution, and hemostasis was achieved.  A 3 cm in diameter 4.2 cm length silicone testicular prosthesis was then secured to the dependent aspect of the scrotum with a 2-0 Prolene suture and placed into the scrotum.  It appeared to have excellent position and appropriate sizing. The wound  was then irrigated with antibiotic solution.  The external oblique fascia was then closed using a running 3-0 Vicryl stitch.  Scarpas fascia was closed with interrupted 3-0 Vicryl stitches.  The wound was injected with 10 cc of 0.25% Marcaine.  The skin was then closed with a 4-0 Vicryl running intracuticular stitch.  Steri-Strips and tincture of Benzoin were applied to secure the wound.  A dressing of 4 x 4s and Hypafix was applied.  A fluff Kerlix and  scrotal support was then applied.  The patients anesthetic was reversed.  He was then moved to the recovery room in stable condition.  There were no complications during the procedure.  Of note, at the end of the procedure, I did an examination of his abdomen under anesthesia.  There did appear to be a probable retroperitoneal mass suggestive of metastatic disease to the retroperitoneum. DD:  09/07/00 TD:  09/07/00 Job: [id number]

## 2011-05-06 ENCOUNTER — Encounter (HOSPITAL_BASED_OUTPATIENT_CLINIC_OR_DEPARTMENT_OTHER): Payer: BC Managed Care – PPO | Admitting: Oncology

## 2011-05-06 ENCOUNTER — Other Ambulatory Visit: Payer: Self-pay | Admitting: Hematology & Oncology

## 2011-05-06 DIAGNOSIS — C6290 Malignant neoplasm of unspecified testis, unspecified whether descended or undescended: Secondary | ICD-10-CM

## 2011-05-06 LAB — CBC WITH DIFFERENTIAL/PLATELET
BASO%: 0.4 % (ref 0.0–2.0)
Basophils Absolute: 0 10*3/uL (ref 0.0–0.1)
EOS%: 1.6 % (ref 0.0–7.0)
HCT: 42.9 % (ref 38.4–49.9)
HGB: 15.1 g/dL (ref 13.0–17.1)
LYMPH%: 27.7 % (ref 14.0–49.0)
MCH: 32 pg (ref 27.2–33.4)
MCHC: 35.1 g/dL (ref 32.0–36.0)
MCV: 91.2 fL (ref 79.3–98.0)
NEUT%: 59.9 % (ref 39.0–75.0)
Platelets: 136 10*3/uL — ABNORMAL LOW (ref 140–400)
lymph#: 1.2 10*3/uL (ref 0.9–3.3)

## 2011-05-09 LAB — COMPREHENSIVE METABOLIC PANEL
AST: 26 U/L (ref 0–37)
Albumin: 4.5 g/dL (ref 3.5–5.2)
Alkaline Phosphatase: 114 U/L (ref 39–117)
BUN: 14 mg/dL (ref 6–23)
Calcium: 9.3 mg/dL (ref 8.4–10.5)
Chloride: 103 mEq/L (ref 96–112)
Glucose, Bld: 91 mg/dL (ref 70–99)
Potassium: 4.2 mEq/L (ref 3.5–5.3)
Sodium: 141 mEq/L (ref 135–145)
Total Protein: 6.9 g/dL (ref 6.0–8.3)

## 2011-05-09 LAB — AFP TUMOR MARKER: AFP-Tumor Marker: 7.1 ng/mL (ref 0.0–8.0)

## 2011-05-09 LAB — VITAMIN D 25 HYDROXY (VIT D DEFICIENCY, FRACTURES): Vit D, 25-Hydroxy: 40 ng/mL (ref 30–89)

## 2011-05-17 ENCOUNTER — Encounter (HOSPITAL_BASED_OUTPATIENT_CLINIC_OR_DEPARTMENT_OTHER): Payer: BC Managed Care – PPO | Admitting: Hematology & Oncology

## 2011-05-17 DIAGNOSIS — C6290 Malignant neoplasm of unspecified testis, unspecified whether descended or undescended: Secondary | ICD-10-CM

## 2011-09-19 MED ORDER — PREGABALIN 300 MG CAP
300 mg | ORAL_CAPSULE | Freq: Every day | ORAL | Status: DC
Start: 2011-09-19 — End: 2011-11-09

## 2011-09-19 MED ORDER — PROPRANOLOL 20 MG TAB
20 mg | ORAL_TABLET | Freq: Two times a day (BID) | ORAL | Status: DC
Start: 2011-09-19 — End: 2012-10-02

## 2011-09-19 MED ORDER — ANAFRANIL 50 MG CAPSULE
50 mg | ORAL_CAPSULE | Freq: Every evening | ORAL | Status: DC
Start: 2011-09-19 — End: 2011-11-15

## 2011-09-19 MED ORDER — ARMODAFINIL 150 MG TABLET
150 mg | ORAL_TABLET | Freq: Every evening | ORAL | Status: DC
Start: 2011-09-19 — End: 2012-01-02

## 2011-09-19 MED ORDER — KLONOPIN 1 MG TABLET
1 mg | ORAL_TABLET | ORAL | Status: DC
Start: 2011-09-19 — End: 2011-11-09

## 2011-09-19 NOTE — Progress Notes (Signed)
Establish care with DR. Amedeo Plenty.

## 2011-09-19 NOTE — Progress Notes (Signed)
HISTORY OF PRESENT ILLNESS  John Friedman is a 46 y.o. male.  HPI  He is a new patient and is here to establish care.  Recently moved from [place] (12/12).  Works at [employer].  Has multiple medical concerns.  He had been followed by multiple specialists previously.    Hospital Follow Up  He reports being seen in ED for chest pain about 3 wks ago.  Work up was negative.  He did see GI (Dr Margart Sickles).  He reports EGD- normal, bx- normal.  He saw cards (Dr [employer]).  He reports stress test- nl.  Only abnl was elevated TSH.  He reports having abd [place] scheduled for tomorrow.  He reports still having on/off chest pain.  CP is left sided, no radiation, at times it is pressure & other times it is sharp. Antacid & PPI are being taken regularly & are helping.  Nothing makes it worse.    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  Symptoms started: 20+ years ago.  He has been followed by Dr Carlyon Shadow previously.  He sees him yearly.  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  PCP has been writing his medications for him.        Current Outpatient Prescriptions   Medication Sig Dispense Refill   ??? cholecalciferol, vitamin D3, (VITAMIN D3) 2,000 unit Tab Take 1 Tab by mouth daily.         ??? multivitamin (ONE A DAY) tablet Take 1 Tab by mouth daily.         ??? omeprazole (PRILOSEC OTC) 20 mg tablet Take 40 mg by mouth daily.         ??? ANAFRANIL 50 mg capsule Take 3 Caps by mouth nightly.  270 Cap  0   ??? KLONOPIN 1 mg tablet 1 tab in am and 1/2 tab in evening  135 Tab  0   ??? pregabalin (LYRICA) 300 mg capsule Take 1 Cap by mouth daily.  90 Cap  0   ??? Armodafinil (NUVIGIL) 150 mg Tab Take 300 mg by mouth nightly.  180 Tab  0   ??? LEVOTHYROXINE SODIUM (LEVOTHYROXINE PO) Take 1 Tab by mouth Daily (before breakfast).         ??? propranolol (INDERAL) 20 mg tablet Take 1 Tab by mouth two (2) times a day.  180 Tab  0     No Known Allergies    Past Medical History    Diagnosis Date   ??? Sleep disturbances      acting out in his sleep   ??? Chronic fatigue January 1987   ??? Hypothyroid    ??? Essential tremor    ??? GERD (gastroesophageal reflux disease)    ??? Cancer      testicular cancer (Stage III), s/p surgery & chemo     Past Surgical History   Procedure Date   ??? Hx urological 2002     left orchiectomy   ??? Hx orthopaedic      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49     Family History   Problem Relation Age of Onset   ??? Parkinsonism Father    ??? Cancer Maternal Grandfather      prostate   ??? Cancer Paternal Grandfather      prostate     History   Substance Use Topics   ??? Smoking status: Never Smoker    ??? Smokeless  tobacco: Never Used   ??? Alcohol Use: Yes      rarely           Review of Systems   Constitutional: Negative for weight loss and malaise/fatigue.   Respiratory: Negative for cough and shortness of breath.    Cardiovascular: Positive for chest pain. Negative for palpitations.   Gastrointestinal: Positive for heartburn. Negative for nausea, vomiting and abdominal pain.   Musculoskeletal: Negative for myalgias, back pain and joint pain.   Neurological: Positive for tremors (stable, worse in hands but all over, using klonopin & Lyrica w/ good control). Negative for headaches.   Psychiatric/Behavioral: Negative for depression. The patient is not nervous/anxious and does not have insomnia.         Sleep disturbance- Rest less legs, acting out dreams, has been on meds > 10 yrs, working well, no side effects       Physical Exam   Constitutional: No distress.   HENT:   Mouth/Throat: Mucous membranes are normal.   Eyes: Conjunctivae are normal. No scleral icterus.   Neck: Neck supple.   Cardiovascular: Regular rhythm and normal heart sounds.    No murmur heard.  Pulmonary/Chest: Effort normal and breath sounds normal. He has no wheezes. He has no rales.   Abdominal: Soft. Bowel sounds are normal. He exhibits no mass. There is no hepatosplenomegaly. There is no tenderness.   Musculoskeletal: He  exhibits no edema.   Lymphadenopathy:     He has no cervical adenopathy.   Skin: No rash noted.   Psychiatric: He has a normal mood and affect. His behavior is normal.       BP 104/76   Pulse 89   Temp(Src) 98 ??F (36.7 ??C) (Oral)   Ht 5\' 11"  (1.803 m)   Wt 183 lb 6.4 oz (83.19 kg)   BMI 25.58 kg/m2   SpO2 98%      ASSESSMENT and PLAN  [name] was seen today for establish care.  Diagnoses and associated orders for this visit:    Chronic fatigue- new dx, meds refilled to mail order, signed release of records to get previous records, he reports EKG x 2 in the last month, will get this for review due to TC  - ANAFRANIL 50 mg capsule; Take 3 Caps by mouth nightly.  - Armodafinil (NUVIGIL) 150 mg Tab; Take 300 mg by mouth nightly.    Essential tremor- stable, will hold off on referral to neuro pending review of previous records, meds refilled x 3 months w/o relief.  - ANAFRANIL 50 mg capsule; Take 3 Caps by mouth nightly.  - KLONOPIN 1 mg tablet; 1 tab in am and 1/2 tab in evening  - pregabalin (LYRICA) 300 mg capsule; Take 1 Cap by mouth daily.  - propranolol (INDERAL) 20 mg tablet; Take 1 Tab by mouth two (2) times a day.    Sleep disturbances- stable per patient, meds refilled  - Armodafinil (NUVIGIL) 150 mg Tab; Take 300 mg by mouth nightly.    Chest pain- unclear, does not sound cardiac or GI related, but w/u is ongoing, will defer to specialists, will get their records for review.    Other Orders  - cholecalciferol, vitamin D3, (VITAMIN D3) 2,000 unit Tab; Take 1 Tab by mouth daily.    - multivitamin (ONE A DAY) tablet; Take 1 Tab by mouth daily.    - Discontinue: propranolol (INDERAL) 10 mg tablet; Take 20 mg by mouth two (2) times a day.    -  omeprazole (PRILOSEC OTC) 20 mg tablet; Take 40 mg by mouth daily.    - LEVOTHYROXINE SODIUM (LEVOTHYROXINE PO); Take 1 Tab by mouth Daily (before breakfast).          Follow-up Disposition:  Return in about 6 weeks (around 10/31/2011).   Advised him to call back or return to  office if symptoms worsen/change/persist.  Discussed expected course/resolution/complications of diagnosis in detail with patient.    Medication risks/benefits/costs/interactions/alternatives discussed with patient.  He was given an after visit summary which includes diagnoses, current medications, & vitals.  He expressed understanding with the diagnosis and plan.

## 2011-09-28 ENCOUNTER — Telehealth: Payer: Self-pay | Admitting: Hematology & Oncology

## 2011-09-28 NOTE — Telephone Encounter (Signed)
Faxed records to [facility] per request received by fax.

## 2011-10-12 NOTE — Telephone Encounter (Signed)
Message copied by Megan Mans on Wed Oct 12, 2011  5:32 PM  ------       Message from: Renaee Munda P       Created: Wed Oct 12, 2011  5:24 PM       Regarding: FW: cph  Rx question         Please call pt.  Notify mail order company is calling to change meds.  He may have requested brand name med, so just verify it is okay to change to generic.  I have no problem if pt is okay with this                            ----- Message -----          From: Pecola Lawless, LPN          Sent: [id number]   3:44 PM            To: Antony Haste, MD       Subject: [name]: cph  Rx question                                       Please advise       ----- Message -----          From: Ellery Plunk          Sent: 10/12/2011   3:19 PM            To: [employer] Team Two Pool       Subject: cph  Rx question                                           [facility] calling to seen if he can have generic for Klonopin which is clonazepam. It is cheaper.  [phone] ref [id number]

## 2011-10-12 NOTE — Telephone Encounter (Signed)
Patient was asked if it was okay for [facility] to substitute clonazepam for the branded Klonopin.  Patient stated "it is NOT okay" and that the generic did not work for him in the past.  I advised him we would let [employer] know he needs to have branded Klonopin.  Pt verbalized understanding.

## 2011-10-13 NOTE — Telephone Encounter (Signed)
Pt requested all brand names on medications.   Spoke with Katrina Stack at [facility], advised per pt's request. Will fill Klonopin as requested.

## 2011-10-13 NOTE — Telephone Encounter (Signed)
Message copied by Pecola Lawless on Thu Oct 13, 2011  9:21 AM  ------       Message from: Renaee Munda P       Created: Wed Oct 12, 2011  5:24 PM       Regarding: FW: cph  Rx question         Please call pt.  Notify mail order company is calling to change meds.  He may have requested brand name med, so just verify it is okay to change to generic.  I have no problem if pt is okay with this                            ----- Message -----          From: Pecola Lawless, LPN          Sent: [id number]   3:44 PM            To: Antony Haste, MD       Subject: [name]: cph  Rx question                                       Please advise       ----- Message -----          From: Ellery Plunk          Sent: 10/12/2011   3:19 PM            To: Weim Team Two Pool       Subject: cph  Rx question                                           [facility] calling to seen if he can have generic for Klonopin which is clonazepam. It is cheaper.  [phone] ref [id number]

## 2011-10-18 NOTE — Telephone Encounter (Signed)
Patient advised as per Dr. Amedeo Plenty of needing to get more information to be able to Rx his Nuvigil.  Pt verbalized understanding and stated that his psychiatrist was the person who was Rxing this medication.  He will call the psychiatrist and have them get in touch with [place] regarding coding.

## 2011-10-18 NOTE — Telephone Encounter (Signed)
Message copied by Megan Mans on Tue Oct 18, 2011  6:20 PM  ------       Message from: Antony Haste       Created: Tue Oct 18, 2011  7:16 AM         Please call pt.  Insurance is denying his Nuvigil.  He needs to contact them to see if he can get more info, convince them.  He should also call previous PCP and get records sent.  I have old records from psychiatrist, but no diagnosis, so I'm not sure how they were coding to get this medication approved.  I can't argue w/ insurance company b/c I don't have this info.  All I have is notes that say he is on it, but no diagnosis.

## 2011-10-19 ENCOUNTER — Encounter (INDEPENDENT_AMBULATORY_CARE_PROVIDER_SITE_OTHER): Payer: Self-pay | Admitting: General Surgery

## 2011-10-24 ENCOUNTER — Encounter (INDEPENDENT_AMBULATORY_CARE_PROVIDER_SITE_OTHER): Payer: Self-pay | Admitting: General Surgery

## 2011-10-24 ENCOUNTER — Ambulatory Visit (INDEPENDENT_AMBULATORY_CARE_PROVIDER_SITE_OTHER): Payer: Managed Care, Other (non HMO) | Admitting: General Surgery

## 2011-10-24 VITALS — BP 102/62 | HR 93 | Temp 98.0°F | Ht 71.0 in | Wt 177.8 lb

## 2011-10-24 DIAGNOSIS — K819 Cholecystitis, unspecified: Secondary | ICD-10-CM

## 2011-10-24 NOTE — Progress Notes (Signed)
Patient ID: Charles Lowe, male   DOB: 09-25-1965, 46 y.o.   MRN: [id number]  Chief Complaint  Patient presents with  . Pre-op Exam    eval gallbladder     HPI Charles Lowe is a 46 y.o. male.  He is referred to me by Dr. Theressa Millard for consideration of cholecystectomy.  This gentleman used to live in [place].  and used to work for [employer]. 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 He is single and lives alone. His mother lives in [place]. He has chronic fatigue syndrome and depression and obsessive  compulsive disorder. He is also been treated for stage III left testicular cancer and that is in remission at this time.  Over the past several months he has been having intermittent episodes of epigastric discomfort and mild nausea. He's had several episodes of more severe pain. He had one episode of diarrhea after eating a pizza. He was evaluated in an emergency department in [place] and was referred to a gastroenterologist there, Dr. Gretel Acre. He has had a trial of proton pump inhibitors which helps a little bit. He has had an upper endoscopy which is normal and I have that report. He has a history of a colonoscopy one year ago. Gallbladder ultrasound on September 20, 2011 looks unremarkable. Hepatobiliary scan on October 03, 2011 is abnormal showing ejection fraction of 2% and normal common bile duct patency. He was instructed that he needs to have a cholecystectomy. He chose to return to [place] where he knows the medical community and also his mother lives in [place] and can help him with this. HPI  Past Medical History  Diagnosis Date  . Anxiety   . Depression   . Chronic fatigue   . Essential tremor   . Testicular cancer   . GERD (gastroesophageal reflux disease)     Past Surgical History  Procedure Date  . Pectus excavatum correction 1968  . Orchiectomy 2002    left    Family  History  Problem Relation Age of Onset  . Cancer Maternal Grandfather     prostate  . Cancer Paternal Grandfather     prostate    Social History History  Substance Use Topics  . Smoking status: Never Smoker   . Smokeless tobacco: Not on file  . Alcohol Use: Yes     1 year    No Known Allergies  Current Outpatient Prescriptions  Medication Sig Dispense Refill  . clomiPRAMINE (ANAFRANIL) 50 MG capsule Take 150 mg by mouth at bedtime.      . clonazePAM (KLONOPIN) 0.5 MG tablet Take 0.5 mg by mouth 2 (two) times daily as needed.      . pregabalin (LYRICA) 300 MG capsule Take 300 mg by mouth 2 (two) times daily.        Review of Systems Review of Systems  Constitutional: Negative for fever, chills and unexpected weight change.  HENT: Negative for hearing loss, congestion, sore throat, trouble swallowing and voice change.   Eyes: Negative for visual disturbance.  Respiratory: Negative for cough and wheezing.   Cardiovascular: Negative for chest pain, palpitations and leg swelling.  Gastrointestinal: Positive for nausea and abdominal pain. Negative for vomiting, diarrhea, constipation, blood in stool, abdominal distention, anal bleeding and rectal pain.  Genitourinary: Negative for hematuria and difficulty urinating.  Musculoskeletal: Negative for arthralgias.  Skin: Negative for rash and wound.  Neurological: Positive for  headaches. Negative for seizures, syncope and weakness.  Hematological: Negative for adenopathy. Does not bruise/bleed easily.  Psychiatric/Behavioral: Negative for confusion. The patient is nervous/anxious.     Blood pressure 102/62, pulse 93, temperature 98 F (36.7 C), temperature source Temporal, height 5\' 11"  (1.803 m), weight 177 lb 12.8 oz (80.65 kg), SpO2 98.00%.  Physical Exam Physical Exam  Constitutional: He is oriented to person, place, and time. He appears well-developed and well-nourished. No distress.  HENT:  Head: Normocephalic.  Nose:  Nose normal.  Mouth/Throat: No oropharyngeal exudate.  Eyes: Conjunctivae and EOM are normal. Pupils are equal, round, and reactive to light. Right eye exhibits no discharge. Left eye exhibits no discharge. No scleral icterus.  Neck: Normal range of motion. Neck supple. No JVD present. No tracheal deviation present. No thyromegaly present.  Cardiovascular: Normal rate, regular rhythm, normal heart sounds and intact distal pulses.   No murmur heard. Pulmonary/Chest: Effort normal and breath sounds normal. No stridor. No respiratory distress. He has no wheezes. He has no rales. He exhibits no tenderness.       Right infraclavicular scar (old port)  Abdominal: Soft. Bowel sounds are normal. He exhibits no distension and no mass. There is no tenderness. There is no rebound and no guarding.  Genitourinary:       Left groin scar.  Musculoskeletal: Normal range of motion. He exhibits no edema and no tenderness.  Lymphadenopathy:    He has no cervical adenopathy.  Neurological: He is alert and oriented to person, place, and time. He has normal reflexes. Coordination normal.  Skin: Skin is warm and dry. No rash noted. He is not diaphoretic. No erythema. No pallor.  Psychiatric: He has a normal mood and affect. His behavior is normal. Judgment and thought content normal.       Pleasant, good insight, intelligent, anxious.    Data Reviewed I have reviewed the office notes from Dr. Gretel Acre in [place]    I have reviewed the endoscopy report, the ultrasound report, and a biliary scan report which were all done in [place]..  Assessment    Chronic acalculus cholecystitis.  Chronic fatigue syndrome.  Chronic headaches.  Obsessive compulsive disorder.  History stage III cancer left testicle, status post left orchiectomy and adjuvant chemotherapy Dr. Arlan Organ. In remission.    Plan    Scheduled for a laparoscopic cholecystectomy with cholangiogram.  I have discussed the  indications and details of surgery with him. Risks and complications have been outlined, including but not limited to bleeding, infection, conversion to open laparotomy, bile leak, injury to adjacent organs such as the intestine or main bile duct, cardiac, pulmonary and thromboembolic problems. He understands these issues. His questions were answered. He agrees with this plan.       Angelia Mould. Derrell Lolling, M.D., [facility] General and Minimally invasive Surgery Breast and Colorectal Surgery Office:   [phone] Pager:   [phone]  10/24/2011, 9:30 AM

## 2011-10-24 NOTE — Patient Instructions (Signed)
You have chronic cholecystitis. Your pain attacks will almost certainly continue until something is done. You will be scheduled for a laparoscopic cholecystectomy with cholangiogram. I recommend a low-fat diet.  Laparoscopic Cholecystectomy Laparoscopic cholecystectomy is surgery to remove the gallbladder. The gallbladder is located slightly to the right of center in the abdomen, behind the liver. It is a concentrating and storage sac for the bile produced in the liver. Bile aids in the digestion and absorption of fats. Gallbladder disease (cholecystitis) is an inflammation of your gallbladder. This condition is usually caused by a buildup of gallstones (cholelithiasis) in your gallbladder. Gallstones can block the flow of bile, resulting in inflammation and pain. In severe cases, emergency surgery may be required. When emergency surgery is not required, you will have time to prepare for the procedure. Laparoscopic surgery is an alternative to open surgery. Laparoscopic surgery usually has a shorter recovery time. Your common bile duct may also need to be examined and explored. Your caregiver will discuss this with you if he or she feels this should be done. If stones are found in the common bile duct, they may be removed. LET YOUR CAREGIVER KNOW ABOUT:  Allergies to food or medicine.   Medicines taken, including vitamins, herbs, eyedrops, over-the-counter medicines, and creams.   Use of steroids (by mouth or creams).   Previous problems with anesthetics or numbing medicines.   History of bleeding problems or blood clots.   Previous surgery.   Other health problems, including diabetes and kidney problems.   Possibility of pregnancy, if this applies.  RISKS AND COMPLICATIONS All surgery is associated with risks. Some problems that may occur following this procedure include:  Infection.   Damage to the common bile duct, nerves, arteries, veins, or other internal organs such as the stomach or  intestines.   Bleeding.   A stone may remain in the common bile duct.  BEFORE THE PROCEDURE  Do not take aspirin for 3 days prior to surgery or blood thinners for 1 week prior to surgery.   Do not eat or drink anything after midnight the night before surgery.   Let your caregiver know if you develop a cold or other infectious problem prior to surgery.   You should be present 60 minutes before the procedure or as directed.  PROCEDURE  You will be given medicine that makes you sleep (general anesthetic). When you are asleep, your surgeon will make several small cuts (incisions) in your abdomen. One of these incisions is used to insert a small, lighted scope (laparoscope) into the abdomen. The laparoscope helps the surgeon see into your abdomen. Carbon dioxide gas will be pumped into your abdomen. The gas allows more room for the surgeon to perform your surgery. Other operating instruments are inserted through the other incisions. Laparoscopic procedures may not be appropriate when:  There is major scarring from previous surgery.   The gallbladder is extremely inflamed.   There are bleeding disorders or unexpected cirrhosis of the liver.   A pregnancy is near term.   Other conditions make the laparoscopic procedure impossible.  If your surgeon feels it is not safe to continue with a laparoscopic procedure, he or she will perform an open abdominal procedure. In this case, the surgeon will make an incision to open the abdomen. This gives the surgeon a larger view and field to work within. This may allow the surgeon to perform procedures that sometimes cannot be performed with a laparoscope alone. Open surgery has a longer recovery  time. AFTER THE PROCEDURE  You will be taken to the recovery area where a nurse will watch and check your progress.   You may be allowed to go home the same day.   Do not resume physical activities until directed by your caregiver.   You may resume a normal  diet and activities as directed.  Document Released: 08/01/2005 Document Revised: 07/21/2011 Document Reviewed: 01/14/2011 [employer] Patient Information 2012 [place], [place].

## 2011-10-25 ENCOUNTER — Encounter (HOSPITAL_COMMUNITY): Payer: Self-pay | Admitting: *Deleted

## 2011-10-25 NOTE — Pre-Procedure Instructions (Signed)
Pt unable to come for pst due to lives in [place] [facility] instructions given by phone Pt instructed hibiclens shower hs and am of surgery

## 2011-10-27 ENCOUNTER — Ambulatory Visit (INDEPENDENT_AMBULATORY_CARE_PROVIDER_SITE_OTHER): Payer: Managed Care, Other (non HMO) | Admitting: General Surgery

## 2011-10-28 ENCOUNTER — Encounter (HOSPITAL_COMMUNITY): Payer: Self-pay | Admitting: Pharmacy Technician

## 2011-10-31 NOTE — Progress Notes (Signed)
Follow up for thyroid and chronic fatigue.

## 2011-10-31 NOTE — Progress Notes (Signed)
HISTORY OF PRESENT ILLNESS  John Friedman is a 46 y.o. male.  HPI  Since last visit records received from multiple specialists.    Infectious Disease Review  [name] here to talk about chronic fatigue.  Insurance denied nuvigil, he reports he has always had to fight with them for his medication.  He is not concerned at this time as he is taking it prn.  He will talk to his psychiatrist for dx codes he was using.  He is concerned about the price of anafranil.  He has always been on brand name and this is reported to be $1200 a year.  He does have generic at home but is scared to use it.  He reports this is the only medication that has helped his "brain fog".  Notes from psychiatrist reveal multiple medications that have been tried.    Endocrine Review  He is here to talk about possibly hypothyroid.  Records indicate this, but no labs.  Will get today.  He is not taking meds.      GI Review  Patient has GERD, only using meds prn, and no recent trouble.  He has been worked up by GI from his ED visit in 1/13.  Found to have low EF for GB.  He is due for surgery next week (3/26) in [place].  He reports currently no abd pain.  Surgeon- Fanny Skates, [place], [place]          Current Outpatient Prescriptions   Medication Sig Dispense Refill   ??? cholecalciferol, vitamin D3, (VITAMIN D3) 2,000 unit Tab Take 1 Tab by mouth daily.         ??? multivitamin (ONE A DAY) tablet Take 1 Tab by mouth daily.         ??? ANAFRANIL 50 mg capsule Take 3 Caps by mouth nightly.  270 Cap  0   ??? KLONOPIN 1 mg tablet 1 tab in am and 1/2 tab in evening  135 Tab  0   ??? pregabalin (LYRICA) 300 mg capsule Take 1 Cap by mouth daily.  90 Cap  0   ??? Armodafinil (NUVIGIL) 150 mg Tab Take 300 mg by mouth nightly.  180 Tab  0   ??? propranolol (INDERAL) 20 mg tablet Take 1 Tab by mouth two (2) times a day.  180 Tab  0   ??? omeprazole (PRILOSEC OTC) 20 mg tablet Take 40 mg by mouth daily.         ??? LEVOTHYROXINE SODIUM (LEVOTHYROXINE PO) Take 1 Tab by mouth  Daily (before breakfast).             No Known Allergies        Review of Systems   Constitutional: Negative for weight loss and malaise/fatigue.   Respiratory: Negative for cough and shortness of breath.    Cardiovascular: Negative for chest pain and palpitations.   Gastrointestinal: Negative for nausea, vomiting, abdominal pain, diarrhea and constipation.   Musculoskeletal: Negative for myalgias and joint pain.   Neurological: Positive for tremors.       Physical Exam   Constitutional: No distress.        thin   Cardiovascular: Regular rhythm and normal heart sounds.    No murmur heard.  Pulmonary/Chest: Effort normal and breath sounds normal. He has no wheezes. He has no rales.   Abdominal: Soft. Bowel sounds are normal. He exhibits no mass. There is no hepatosplenomegaly. There is no tenderness.   Neurological:  Tremor  notes   Psychiatric: He has a normal mood and affect. His behavior is normal.       BP 98/72   Pulse 95   Temp(Src) 98 ??F (36.7 ??C) (Oral)   Ht 5\' 11"  (1.803 m)   Wt 183 lb (83.008 kg)   BMI 25.52 kg/m2   SpO2 95%      ASSESSMENT and PLAN  John Friedman was seen today for thyroid problem.  Diagnoses and associated orders for this visit:    John Friedman (gastroesophageal reflux disease)- well controlled, stable, cont prn meds     Chronic fatigue- well controlled, continue current treatment, he will try generic Anafranil.  He will get me dx codes from psychiatrist (not listed in note)    Hypothyroid- unclear if this is a diagnosis or not, no labs available, but was listed as a problem in previous PCP notes.  Check labs today  - TSH, 3RD GENERATION    Abdominal pain- resolved, has had extensive GI w/u and only abnl was low GB EF.  We reviewed indications, pros/cons to cholecystectomy and will proceed at this time.  He has pre-op prior to surgery.  [name] low risk for cardiac complications.        Follow-up Disposition:  Return in about 3 months (around 01/31/2012).   Advised him to call back or return to  office if symptoms worsen/change/persist.  Discussed expected course/resolution/complications of diagnosis in detail with patient.    Medication risks/benefits/costs/interactions/alternatives discussed with patient.  He was given an after visit summary which includes diagnoses, current medications, & vitals.  He expressed understanding with the diagnosis and plan.

## 2011-11-01 LAB — TSH 3RD GENERATION: TSH: 5.11 u[IU]/mL — ABNORMAL HIGH (ref 0.450–4.500)

## 2011-11-01 MED ORDER — LEVOTHYROXINE 50 MCG TAB
50 mcg | ORAL_TABLET | Freq: Every day | ORAL | Status: DC
Start: 2011-11-01 — End: 2012-01-02

## 2011-11-01 NOTE — Progress Notes (Signed)
Quick Note:    Advised pt per Dr.Hayes' note. Sent Levothyroxine per Dr.Hayes' order to pt's local pharmacy.  ______

## 2011-11-01 NOTE — Progress Notes (Signed)
Quick Note:    Please call patient. TSH is slightly abnl. I think he would benefit from thyroid medication. Start w/ 66mcg daily, #90, RF 0. Will need labs in 3 months to verify correct dose. Any side effects should let me know  ______

## 2011-11-07 ENCOUNTER — Ambulatory Visit (INDEPENDENT_AMBULATORY_CARE_PROVIDER_SITE_OTHER): Payer: Managed Care, Other (non HMO) | Admitting: General Surgery

## 2011-11-09 ENCOUNTER — Encounter

## 2011-11-09 MED ORDER — KLONOPIN 1 MG TABLET
1 mg | ORAL_TABLET | ORAL | Status: DC
Start: 2011-11-09 — End: 2012-01-02

## 2011-11-09 NOTE — Telephone Encounter (Signed)
Advised pt Klonopin script ready for him to pick at the front desk.

## 2011-11-10 MED ORDER — PREGABALIN 300 MG CAP
300 mg | ORAL_CAPSULE | Freq: Every day | ORAL | Status: DC
Start: 2011-11-10 — End: 2012-03-01

## 2011-11-10 NOTE — Telephone Encounter (Signed)
Patient will pick up next week.

## 2011-11-10 NOTE — Telephone Encounter (Signed)
Call pt.  Can't send this in electronically, they need a hard script.  This is printed.

## 2011-11-15 ENCOUNTER — Encounter

## 2011-11-15 MED ORDER — CLOMIPRAMINE 50 MG CAP
50 mg | ORAL_CAPSULE | ORAL | Status: DC
Start: 2011-11-15 — End: 2011-11-18

## 2011-11-15 MED ORDER — CLOMIPRAMINE 50 MG CAP
50 mg | ORAL_CAPSULE | ORAL | Status: DC
Start: 2011-11-15 — End: 2011-11-15

## 2011-11-15 NOTE — Addendum Note (Signed)
Addended by: Antony Haste on: 11/15/2011 09:04 AM     Modules accepted: Orders

## 2011-11-18 MED ORDER — CLOMIPRAMINE 50 MG CAP
50 mg | ORAL_CAPSULE | Freq: Every day | ORAL | Status: DC
Start: 2011-11-18 — End: 2012-01-02

## 2011-11-18 NOTE — Addendum Note (Signed)
Addended by: Antony Haste on: 11/18/2011 07:20 AM     Modules accepted: Orders

## 2011-11-24 ENCOUNTER — Telehealth (INDEPENDENT_AMBULATORY_CARE_PROVIDER_SITE_OTHER): Payer: Self-pay

## 2011-11-24 NOTE — Telephone Encounter (Signed)
Pt calling in b/c having more symptoms with his gallbladder now. The pt is scheduled for surgery on 12-05-11 but the pt lives in [place]. The pt has been experincing diarrhea,RUQ pain,and chills. The pt wanted to know if he drove the 4hrs today would he be seen to have his surgery moved up earlier. I spoke to [place] and Dr Derrell Lolling who advised pt to go to his local ER to be evaluated to see it pt is acute. We can't promise if the pt drives all this way that he would have his surgery today. The pt understands.

## 2011-11-28 ENCOUNTER — Encounter (HOSPITAL_COMMUNITY): Payer: Self-pay | Admitting: *Deleted

## 2011-11-28 NOTE — Pre-Procedure Instructions (Signed)
PT INSTRUCTED BETASEPT SOAP SHOWER THE NIGHT BEFORE HIS SURGERY AND THE AM OF HIS SURGERY-CAUTIONED NOT TO USE ON HIS FACE, HEAD, PRIVATE AREAS--MAY USE HIS NORMAL SOAP THOSE AREAS. PT REQUESTED [facility] --STATES HE LIVES IN [place]--AND DOES NOT HAVE ANY TRIPS SCHEDULED TO [place] BETWEEN NOW AND DAY OF SURGERY.

## 2011-11-30 NOTE — H&P (Signed)
Suhaas Agena     MRN: [id number]   Description: 46 year old male  Provider: [name], MD  Department: [facility]     Diagnoses     Cholecystitis without calculus   - Primary    575.10       Vitals   BP Pulse Temp(Src) Ht Wt BMI    102/62  93  98 F (36.7 C) (Temporal)  5\' 11"  (1.803 m)  177 lb 12.8 oz (80.65 kg)  24.80 kg/m2                    History and Physical    [name], MD  Patient ID: Charles Lowe, male   DOB: [birth year], 46 y.o.   MRN: [id number]            HPI Charles Lowe is a 46 y.o. male.  He is referred to me by Dr. Theressa Millard for consideration of cholecystectomy.  This gentleman used to live in [place].  and used to work for [employer]. 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 His mother lives in [place]. He has chronic fatigue syndrome and depression and obsessive  compulsive disorder. He is also been treated for stage III left testicular cancer and that is in remission at this time.  Over the past several months he has been having intermittent episodes of epigastric discomfort and mild nausea. He's had several episodes of more severe pain. He had one episode of diarrhea after eating a pizza. He was evaluated in an emergency department in [place] and was referred to a gastroenterologist there, Dr. Gretel Acre. He has had a trial of proton pump inhibitors which helps a little bit. He has had an upper endoscopy which is normal and I have that report. He has a history of a colonoscopy one year ago. Gallbladder ultrasound on September 20, 2011 looks unremarkable. Hepatobiliary scan on October 03, 2011 is abnormal showing ejection fraction of 2% and normal common bile duct patency. He was instructed that he needs to have a cholecystectomy. He chose to return to [place] where he knows the medical community and also his  mother lives in [place] and can help him with this.     Past Medical History   Diagnosis  Date   .  Anxiety     .  Depression     .  Chronic fatigue     .  Essential tremor     .  Testicular cancer     .  GERD (gastroesophageal reflux disease)         Past Surgical History   Procedure  Date   .  Pectus excavatum correction  1968   .  Orchiectomy  2002       left      Family History   Problem  Relation  Age of Onset   .  Cancer  Maternal Grandfather         prostate   .  Cancer  Paternal Grandfather         prostate     Social History History   Substance Use Topics   .  Smoking status:  Never Smoker    .  Smokeless tobacco:  Not on file   .  Alcohol Use:  Yes         1 year  No Known Allergies    Current Outpatient Prescriptions   Medication  Sig  Dispense  Refill   .  clomiPRAMINE (ANAFRANIL) 50 MG capsule  Take 150 mg by mouth at bedtime.         .  clonazePAM (KLONOPIN) 0.5 MG tablet  Take 0.5 mg by mouth 2 (two) times daily as needed.         .  pregabalin (LYRICA) 300 MG capsule  Take 300 mg by mouth 2 (two) times daily.             Review of Systems   Constitutional: Negative for fever, chills and unexpected weight change.  HENT: Negative for hearing loss, congestion, sore throat, trouble swallowing and voice change.   Eyes: Negative for visual disturbance.  Respiratory: Negative for cough and wheezing.   Cardiovascular: Negative for chest pain, palpitations and leg swelling.  Gastrointestinal: Positive for nausea and abdominal pain. Negative for vomiting, diarrhea, constipation, blood in stool, abdominal distention, anal bleeding and rectal pain.  Genitourinary: Negative for hematuria and difficulty urinating.  Musculoskeletal: Negative for arthralgias.  Skin: Negative for rash and wound.  Neurological: Positive for headaches. Negative for seizures, syncope and weakness.  Hematological: Negative for adenopathy. Does not bruise/bleed easily.    Psychiatric/Behavioral: Negative for confusion. The patient is nervous/anxious.     Blood pressure 102/62, pulse 93, temperature 98 F (36.7 C), temperature source Temporal, height 5\' 11"  (1.803 m), weight 177 lb 12.8 oz (80.65 kg), SpO2 98.00%.  Physical Exam  Constitutional: He is oriented to person, place, and time. He appears well-developed and well-nourished. No distress.  HENT:   Head: Normocephalic.   Nose: Nose normal.   Mouth/Throat: No oropharyngeal exudate.  Eyes: Conjunctivae and EOM are normal. Pupils are equal, round, and reactive to light. Right eye exhibits no discharge. Left eye exhibits no discharge. No scleral icterus.  Neck: Normal range of motion. Neck supple. No JVD present. No tracheal deviation present. No thyromegaly present.  Cardiovascular: Normal rate, regular rhythm, normal heart sounds and intact distal pulses.    No murmur heard. Pulmonary/Chest: Effort normal and breath sounds normal. No stridor. No respiratory distress. He has no wheezes. He has no rales. He exhibits no tenderness.       Right infraclavicular scar (old port)  Abdominal: Soft. Bowel sounds are normal. He exhibits no distension and no mass. There is no tenderness. There is no rebound and no guarding.  Genitourinary:       Left groin scar.  Musculoskeletal: Normal range of motion. He exhibits no edema and no tenderness.  Lymphadenopathy:    He has no cervical adenopathy.  Neurological: He is alert and oriented to person, place, and time. He has normal reflexes. Coordination normal.  Skin: Skin is warm and dry. No rash noted. He is not diaphoretic. No erythema. No pallor.  Psychiatric: He has a normal mood and affect. His behavior is normal. Judgment and thought content normal.       Pleasant, good insight, intelligent, anxious.    Data Reviewed I have reviewed the office notes from Dr. Gretel Acre in [place]    I have reviewed the endoscopy report, the ultrasound report, and a  biliary scan report which were all done in [place]..   Assessment   Chronic acalculus cholecystitis.  Chronic fatigue syndrome.  Chronic headaches.  Obsessive compulsive disorder.  History stage III cancer left testicle, status post left orchiectomy and adjuvant chemotherapy Dr. Arlan Organ. In remission.   Plan  Scheduled for a laparoscopic cholecystectomy with cholangiogram.  I have discussed the indications and details of surgery with him. Risks and complications have been outlined, including but not limited to bleeding, infection, conversion to open laparotomy, bile leak, injury to adjacent organs such as the intestine or main bile duct, cardiac, pulmonary and thromboembolic problems. He understands these issues. His questions were answered. He agrees with this plan.     Angelia Mould. Derrell Lolling, M.D., [facility] General and Minimally invasive Surgery Breast and Colorectal Surgery Office:   [phone] Pager:   [phone]

## 2011-12-01 ENCOUNTER — Encounter (INDEPENDENT_AMBULATORY_CARE_PROVIDER_SITE_OTHER): Payer: Self-pay

## 2011-12-05 ENCOUNTER — Ambulatory Visit (HOSPITAL_COMMUNITY): Payer: Managed Care, Other (non HMO)

## 2011-12-05 ENCOUNTER — Encounter (HOSPITAL_COMMUNITY)
Admission: RE | Disposition: A | Discharge status: Home / Self Care | Payer: Self-pay | Source: Ambulatory Visit | Attending: General Surgery

## 2011-12-05 ENCOUNTER — Encounter (HOSPITAL_COMMUNITY): Payer: Self-pay | Admitting: Anesthesiology

## 2011-12-05 ENCOUNTER — Ambulatory Visit (HOSPITAL_COMMUNITY)
Admission: RE | Admit: 2011-12-05 | Discharge: 2011-12-05 | Disposition: A | Discharge status: Home / Self Care | Payer: Managed Care, Other (non HMO) | Source: Ambulatory Visit | Attending: General Surgery | Admitting: General Surgery

## 2011-12-05 ENCOUNTER — Ambulatory Visit (HOSPITAL_COMMUNITY): Payer: Managed Care, Other (non HMO) | Admitting: Anesthesiology

## 2011-12-05 ENCOUNTER — Encounter (HOSPITAL_COMMUNITY): Payer: Self-pay | Admitting: *Deleted

## 2011-12-05 DIAGNOSIS — Z9221 Personal history of antineoplastic chemotherapy: Secondary | ICD-10-CM | POA: Insufficient documentation

## 2011-12-05 DIAGNOSIS — G9332 Myalgic encephalomyelitis/chronic fatigue syndrome: Secondary | ICD-10-CM | POA: Insufficient documentation

## 2011-12-05 DIAGNOSIS — Z9079 Acquired absence of other genital organ(s): Secondary | ICD-10-CM | POA: Insufficient documentation

## 2011-12-05 DIAGNOSIS — C6290 Malignant neoplasm of unspecified testis, unspecified whether descended or undescended: Secondary | ICD-10-CM | POA: Insufficient documentation

## 2011-12-05 DIAGNOSIS — K811 Chronic cholecystitis: Secondary | ICD-10-CM

## 2011-12-05 DIAGNOSIS — F429 Obsessive-compulsive disorder, unspecified: Secondary | ICD-10-CM | POA: Insufficient documentation

## 2011-12-05 DIAGNOSIS — Z79899 Other long term (current) drug therapy: Secondary | ICD-10-CM | POA: Insufficient documentation

## 2011-12-05 DIAGNOSIS — F3289 Other specified depressive episodes: Secondary | ICD-10-CM | POA: Insufficient documentation

## 2011-12-05 DIAGNOSIS — R5382 Chronic fatigue, unspecified: Secondary | ICD-10-CM | POA: Insufficient documentation

## 2011-12-05 DIAGNOSIS — F329 Major depressive disorder, single episode, unspecified: Secondary | ICD-10-CM | POA: Insufficient documentation

## 2011-12-05 DIAGNOSIS — K219 Gastro-esophageal reflux disease without esophagitis: Secondary | ICD-10-CM | POA: Insufficient documentation

## 2011-12-05 DIAGNOSIS — R51 Headache: Secondary | ICD-10-CM | POA: Insufficient documentation

## 2011-12-05 HISTORY — PX: CHOLECYSTECTOMY: SHX55

## 2011-12-05 HISTORY — DX: Dizziness and giddiness: R42

## 2011-12-05 HISTORY — DX: Other specified diseases of gallbladder: K82.8

## 2011-12-05 LAB — CBC
HCT: 38.6 % — ABNORMAL LOW (ref 39.0–52.0)
Hemoglobin: 13.4 g/dL (ref 13.0–17.0)
MCHC: 34.7 g/dL (ref 30.0–36.0)
RBC: 4.32 MIL/uL (ref 4.22–5.81)
WBC: 3.4 10*3/uL — ABNORMAL LOW (ref 4.0–10.5)

## 2011-12-05 LAB — COMPREHENSIVE METABOLIC PANEL
ALT: 28 U/L (ref 0–53)
Alkaline Phosphatase: 114 U/L (ref 39–117)
CO2: 27 mEq/L (ref 19–32)
Chloride: 103 mEq/L (ref 96–112)
GFR calc Af Amer: 84 mL/min — ABNORMAL LOW (ref >90)
Glucose, Bld: 81 mg/dL (ref 70–99)
Potassium: 3.9 mEq/L (ref 3.5–5.1)
Sodium: 139 mEq/L (ref 135–145)
Total Bilirubin: 0.2 mg/dL — ABNORMAL LOW (ref 0.3–1.2)
Total Protein: 7.1 g/dL (ref 6.0–8.3)

## 2011-12-05 LAB — SURGICAL PCR SCREEN
MRSA, PCR: NEGATIVE
Staphylococcus aureus: POSITIVE — AB

## 2011-12-05 LAB — URINALYSIS, ROUTINE W REFLEX MICROSCOPIC
Bilirubin Urine: NEGATIVE
Ketones, ur: NEGATIVE mg/dL
Nitrite: NEGATIVE
Protein, ur: NEGATIVE mg/dL
pH: 7 (ref 5.0–8.0)

## 2011-12-05 SURGERY — LAPAROSCOPIC CHOLECYSTECTOMY
Anesthesia: General | Site: Abdomen | Wound class: Contaminated

## 2011-12-05 MED ORDER — CEFAZOLIN SODIUM-DEXTROSE 2-3 GM-% IV SOLR
2.0000 g | INTRAVENOUS | Status: AC
  Administered 2011-12-05: 2 g via INTRAVENOUS

## 2011-12-05 MED ORDER — SODIUM CHLORIDE 0.9 % IJ SOLN: 3.0000 mL | INTRAMUSCULAR | Status: DC | PRN

## 2011-12-05 MED ORDER — MORPHINE SULFATE 10 MG/ML IJ SOLN: 2.0000 mg | INTRAMUSCULAR | Status: DC | PRN

## 2011-12-05 MED ORDER — BUPIVACAINE-EPINEPHRINE 0.5% -1:200000 IJ SOLN
INTRAMUSCULAR | Status: DC | PRN
  Administered 2011-12-05: 20 mL

## 2011-12-05 MED ORDER — FENTANYL CITRATE 0.05 MG/ML IJ SOLN
INTRAMUSCULAR | Status: DC | PRN
  Administered 2011-12-05 (×3): 50 ug via INTRAVENOUS
  Administered 2011-12-05: 100 ug via INTRAVENOUS

## 2011-12-05 MED ORDER — ACETAMINOPHEN 325 MG PO TABS: 650.0000 mg | ORAL_TABLET | ORAL | Status: DC | PRN

## 2011-12-05 MED ORDER — SUCCINYLCHOLINE CHLORIDE 20 MG/ML IJ SOLN
INTRAMUSCULAR | Status: DC | PRN
  Administered 2011-12-05: 100 mg via INTRAVENOUS

## 2011-12-05 MED ORDER — MIDAZOLAM HCL 5 MG/5ML IJ SOLN
INTRAMUSCULAR | Status: DC | PRN
  Administered 2011-12-05: 2 mg via INTRAVENOUS

## 2011-12-05 MED ORDER — ACETAMINOPHEN 10 MG/ML IV SOLN
INTRAVENOUS | Status: AC
  Filled 2011-12-05: qty 100

## 2011-12-05 MED ORDER — EPHEDRINE SULFATE 50 MG/ML IJ SOLN
INTRAMUSCULAR | Status: DC | PRN
  Administered 2011-12-05: 10 mg via INTRAVENOUS

## 2011-12-05 MED ORDER — SODIUM CHLORIDE 0.9 % IV SOLN
INTRAVENOUS | Status: DC
  Administered 2011-12-05: 14:00:00 via INTRAVENOUS

## 2011-12-05 MED ORDER — OXYCODONE HCL 5 MG PO TABS
5.0000 mg | ORAL_TABLET | ORAL | Status: DC | PRN
  Administered 2011-12-05: 5 mg via ORAL

## 2011-12-05 MED ORDER — MUPIROCIN 2 % EX OINT
TOPICAL_OINTMENT | CUTANEOUS | Status: AC
  Filled 2011-12-05: qty 22

## 2011-12-05 MED ORDER — IOHEXOL 300 MG/ML  SOLN
INTRAMUSCULAR | Status: AC
  Filled 2011-12-05: qty 1

## 2011-12-05 MED ORDER — NEOSTIGMINE METHYLSULFATE 1 MG/ML IJ SOLN
INTRAMUSCULAR | Status: DC | PRN
  Administered 2011-12-05: 3 mg via INTRAVENOUS

## 2011-12-05 MED ORDER — ACETAMINOPHEN 650 MG RE SUPP
650.0000 mg | RECTAL | Status: DC | PRN
  Filled 2011-12-05: qty 1

## 2011-12-05 MED ORDER — HYDROMORPHONE HCL PF 1 MG/ML IJ SOLN: 0.2500 mg | INTRAMUSCULAR | Status: DC | PRN

## 2011-12-05 MED ORDER — ROCURONIUM BROMIDE 100 MG/10ML IV SOLN
INTRAVENOUS | Status: DC | PRN
  Administered 2011-12-05: 40 mg via INTRAVENOUS

## 2011-12-05 MED ORDER — LACTATED RINGERS IV SOLN
INTRAVENOUS | Status: DC
  Administered 2011-12-05: 13:00:00 via INTRAVENOUS
  Administered 2011-12-05: 1000 mL via INTRAVENOUS

## 2011-12-05 MED ORDER — GLYCOPYRROLATE 0.2 MG/ML IJ SOLN
INTRAMUSCULAR | Status: DC | PRN
  Administered 2011-12-05: .6 mg via INTRAVENOUS

## 2011-12-05 MED ORDER — PROPOFOL 10 MG/ML IV BOLUS
INTRAVENOUS | Status: DC | PRN
  Administered 2011-12-05: 300 mg via INTRAVENOUS

## 2011-12-05 MED ORDER — SODIUM CHLORIDE 0.9 % IV SOLN: 250.0000 mL | INTRAVENOUS | Status: DC | PRN

## 2011-12-05 MED ORDER — SODIUM CHLORIDE 0.9 % IJ SOLN: 3.0000 mL | Freq: Two times a day (BID) | INTRAMUSCULAR | Status: DC

## 2011-12-05 MED ORDER — CEFAZOLIN SODIUM-DEXTROSE 2-3 GM-% IV SOLR
INTRAVENOUS | Status: AC
  Filled 2011-12-05: qty 50

## 2011-12-05 MED ORDER — ACETAMINOPHEN 10 MG/ML IV SOLN
INTRAVENOUS | Status: DC | PRN
  Administered 2011-12-05: 1000 mg via INTRAVENOUS

## 2011-12-05 MED ORDER — HYDROCODONE-ACETAMINOPHEN 5-325 MG PO TABS: 1.0000 | ORAL_TABLET | ORAL | Status: AC | PRN

## 2011-12-05 MED ORDER — LIDOCAINE HCL (CARDIAC) 20 MG/ML IV SOLN
INTRAVENOUS | Status: DC | PRN
  Administered 2011-12-05: 100 mg via INTRAVENOUS

## 2011-12-05 MED ORDER — OXYCODONE HCL 5 MG PO TABS
ORAL_TABLET | ORAL | Status: AC
  Filled 2011-12-05: qty 1

## 2011-12-05 MED ORDER — ONDANSETRON HCL 4 MG/2ML IJ SOLN: 4.0000 mg | Freq: Four times a day (QID) | INTRAMUSCULAR | Status: DC | PRN

## 2011-12-05 MED ORDER — ONDANSETRON HCL 4 MG/2ML IJ SOLN
INTRAMUSCULAR | Status: DC | PRN
  Administered 2011-12-05: 4 mg via INTRAVENOUS

## 2011-12-05 MED ORDER — BUPIVACAINE-EPINEPHRINE (PF) 0.5% -1:200000 IJ SOLN
INTRAMUSCULAR | Status: AC
  Filled 2011-12-05: qty 10

## 2011-12-05 SURGICAL SUPPLY — 40 items
ADH SKN CLS APL DERMABOND .7 (GAUZE/BANDAGES/DRESSINGS) ×2
APL SKNCLS STERI-STRIP NONHPOA (GAUZE/BANDAGES/DRESSINGS) ×2
APPLIER CLIP ROT 10 11.4 M/L (STAPLE) ×3
APR CLP MED LRG 11.4X10 (STAPLE) ×2
BAG SPEC RTRVL LRG 6X4 10 (ENDOMECHANICALS) ×2
BENZOIN TINCTURE PRP APPL 2/3 (GAUZE/BANDAGES/DRESSINGS) ×3 IMPLANT
CANISTER SUCTION 2500CC (MISCELLANEOUS) ×3 IMPLANT
CLIP APPLIE ROT 10 11.4 M/L (STAPLE) ×2 IMPLANT
CLOTH BEACON ORANGE TIMEOUT ST (SAFETY) ×3 IMPLANT
COVER MAYO STAND STRL (DRAPES) ×3 IMPLANT
DECANTER SPIKE VIAL GLASS SM (MISCELLANEOUS) ×3 IMPLANT
DERMABOND ADVANCED (GAUZE/BANDAGES/DRESSINGS) ×1
DERMABOND ADVANCED .7 DNX12 (GAUZE/BANDAGES/DRESSINGS) ×2 IMPLANT
DRAPE C-ARM 42X72 X-RAY (DRAPES) ×3 IMPLANT
DRAPE LAPAROSCOPIC ABDOMINAL (DRAPES) ×3 IMPLANT
ELECT REM PT RETURN 9FT ADLT (ELECTROSURGICAL) ×3
ELECTRODE REM PT RTRN 9FT ADLT (ELECTROSURGICAL) ×2 IMPLANT
GLOVE BIOGEL PI IND STRL 7.0 (GLOVE) ×2 IMPLANT
GLOVE BIOGEL PI INDICATOR 7.0 (GLOVE) ×1
GLOVE EUDERMIC 7 POWDERFREE (GLOVE) ×3 IMPLANT
GOWN STRL NON-REIN LRG LVL3 (GOWN DISPOSABLE) ×3 IMPLANT
GOWN STRL REIN XL XLG (GOWN DISPOSABLE) ×6 IMPLANT
HEMOSTAT SNOW SURGICEL 2X4 (HEMOSTASIS) ×1 IMPLANT
IV LACTATED RINGER IRRG 3000ML (IV SOLUTION)
IV LR IRRIG 3000ML ARTHROMATIC (IV SOLUTION) ×1 IMPLANT
KIT BASIN OR (CUSTOM PROCEDURE TRAY) ×3 IMPLANT
NS IRRIG 1000ML POUR BTL (IV SOLUTION) ×3 IMPLANT
POUCH SPECIMEN RETRIEVAL 10MM (ENDOMECHANICALS) ×2 IMPLANT
SET CHOLANGIOGRAPH MIX (MISCELLANEOUS) ×3 IMPLANT
SET IRRIG TUBING LAPAROSCOPIC (IRRIGATION / IRRIGATOR) ×3 IMPLANT
SOLUTION ANTI FOG 6CC (MISCELLANEOUS) ×3 IMPLANT
STRIP CLOSURE SKIN 1/2X4 (GAUZE/BANDAGES/DRESSINGS) ×3 IMPLANT
SUT MNCRL AB 4-0 PS2 18 (SUTURE) ×3 IMPLANT
SYRINGE IRR TOOMEY STRL 70CC (SYRINGE) ×1 IMPLANT
TOWEL OR 17X26 10 PK STRL BLUE (TOWEL DISPOSABLE) ×9 IMPLANT
TRAY LAP CHOLE (CUSTOM PROCEDURE TRAY) ×3 IMPLANT
TROCAR BLADELESS OPT 5 75 (ENDOMECHANICALS) ×4 IMPLANT
TROCAR XCEL BLUNT TIP 100MML (ENDOMECHANICALS) ×3 IMPLANT
TROCAR XCEL NON-BLD 11X100MML (ENDOMECHANICALS) ×2 IMPLANT
TUBING INSUFFLATION 10FT LAP (TUBING) ×3 IMPLANT

## 2011-12-05 NOTE — Discharge Instructions (Signed)
[facility] ______[facility] °LAPAROSCOPIC SURGERY: POST OP INSTRUCTIONS °Always review your discharge instruction sheet given to you by the facility where your surgery was performed. °IF YOU HAVE DISABILITY OR FAMILY LEAVE FORMS, YOU MUST BRING THEM TO THE OFFICE FOR PROCESSING.   °DO NOT GIVE THEM TO YOUR DOCTOR. ° °1. A prescription for pain medication may be given to you upon discharge.  Take your pain medication as prescribed, if needed.  If narcotic pain medicine is not needed, then you may take acetaminophen (Tylenol) or ibuprofen (Advil) as needed. °2. Take your usually prescribed medications unless otherwise directed. °3. If you need a refill on your pain medication, please contact your pharmacy.  They will contact our office to request authorization. Prescriptions will not be filled after 5pm or on week-ends. °4. You should follow a light diet the first few days after arrival home, such as soup and crackers, etc.  Be sure to include lots of fluids daily. °5. Most patients will experience some swelling and bruising in the area of the incisions.  Ice packs will help.  Swelling and bruising can take several days to resolve.  °6. It is common to experience some constipation if taking pain medication after surgery.  Increasing fluid intake and taking a stool softener (such as Colace) will usually help or prevent this problem from occurring.  A mild laxative (Milk of Magnesia or Miralax) should be taken according to package instructions if there are no bowel movements after 48 hours. °7. Unless discharge instructions indicate otherwise, you may remove your bandages 24-48 hours after surgery, and you may shower at that time.  You may have steri-strips (small skin tapes) in place directly over the incision.  These strips should be left on the skin for 7-10 days.  If your surgeon used skin glue on the incision, you may shower in 24 hours.  The glue will flake off over the next 2-3 weeks.  Any sutures or  staples will be removed at the office during your follow-up visit. °8. ACTIVITIES:  You may resume regular (light) daily activities beginning the next day--such as daily self-care, walking, climbing stairs--gradually increasing activities as tolerated.  You may have sexual intercourse when it is comfortable.  Refrain from any heavy lifting or straining until approved by your doctor. °a. You may drive when you are no longer taking prescription pain medication, you can comfortably wear a seatbelt, and you can safely maneuver your car and apply brakes. °b. RETURN TO WORK:  __________________________________________________________ °9. You should see your doctor in the office for a follow-up appointment approximately 2-3 weeks after your surgery.  Make sure that you call for this appointment within a day or two after you arrive home to insure a convenient appointment time. °10. OTHER INSTRUCTIONS: __________________________________________________________________________________________________________________________ __________________________________________________________________________________________________________________________ °WHEN TO CALL YOUR DOCTOR: °1. Fever over 101.0 °2. Inability to urinate °3. Continued bleeding from incision. °4. Increased pain, redness, or drainage from the incision. °5. Increasing abdominal pain ° °The clinic staff is available to answer your questions during regular business hours.  Please don’t hesitate to call and ask to speak to one of the nurses for clinical concerns.  If you have a medical emergency, go to the nearest emergency room or call 911.  A surgeon from [facility] is always on call at the hospital. °[address], [address], [place], [place]  [address] ? [address] [address], [place], [place]   [address] °[phone] ? [phone] ? FAX [phone] °Web site:   [email or web address] °

## 2011-12-05 NOTE — Anesthesia Postprocedure Evaluation (Signed)
  Anesthesia Post-op Note  Patient: Charles Lowe  Procedure(s) Performed: Procedure(s) (LRB): LAPAROSCOPIC CHOLECYSTECTOMY (N/A)  Patient Location: PACU  Anesthesia Type: General  Level of Consciousness: oriented and sedated  Airway and Oxygen Therapy: Patient Spontanous Breathing  Post-op Pain: mild  Post-op Assessment: Post-op Vital signs reviewed, Patient's Cardiovascular Status Stable, Respiratory Function Stable and Patent Airway  Post-op Vital Signs: stable  Complications: No apparent anesthesia complications

## 2011-12-05 NOTE — Op Note (Signed)
Patient Name:           Charles Lowe   Date of Surgery:        12/05/2011  Pre op Diagnosis:      Acalculous cholecystitis, chronic  Post op Diagnosis:    Acalculous cholecystitis, chronic  Procedure:                 Laparoscopic cholecystectomy  Surgeon:                     Angelia Mould. Derrell Lolling, M.D., FACS  Assistant:                      Ovidio Kin, M.D., FACS  Operative Indications:   This gentleman used to live in [place]. and used to work for [employer]. 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 He is single and lives alone. His mother lives in [place]. He has chronic fatigue syndrome and depression and obsessive compulsive disorder. He is also been treated for stage III left testicular cancer and that is in remission at this time.   Over the past several months he has been having intermittent episodes of epigastric discomfort and mild nausea. He's had several episodes of more severe pain. He had one episode of diarrhea after eating a pizza. He was evaluated in an emergency department in [place] and was referred to a gastroenterologist there, Dr. Gretel Acre. He has had a trial of proton pump inhibitors which helps a little bit. He has had an upper endoscopy which is normal and I have that report. He has a history of a colonoscopy one year ago. Gallbladder ultrasound on September 20, 2011 looks unremarkable. Hepatobiliary scan on October 03, 2011 is abnormal showing ejection fraction of 2% and normal common bile duct patency. He was instructed that he needs to have a cholecystectomy. He chose to return to [place] where he knows the medical community and also his mother lives in [place] and can help him with this.I have evaluated him and counseled him as an outpatient. I feel that his history is consistent with biliary colic and his hepatobiliary scan was markedly abnormal. Cholecystectomy was offered and he  is brought to the operating room electively.   Operative Findings:       The gallbladder was chronically inflamed. The cystic duct was extremely tiny, essentially obliterated, it could not be cannulated. The anatomy of the gallbladder, cystic duct, common bile duct, and cystic artery were conventional. The liver was healthy. The stomach, duodenum, small bowel, and large bowel, and peritoneal surfaces looked normal. There was no other abnormality noted.  Procedure in Detail:          Following the induction of general endotracheal anesthesia, a surgical timeout was performed. The abdomen was prepped and draped in a sterile fashion. Intravenous antibiotics were given. 0.5% Marcaine with epinephrine was used as a local infiltration anesthetic. An infraumbilical incision was made and a Hassan cannula was inserted under direct vision  with an open technique under direct vision. Pneumoperitoneum was created. A camera was inserted. An 11 mm trocar was placed in the subxiphoid region and two  5 mm trocars in the right upper quadrant. The gallbladder fundus was identified and elevated. Some adhesions were taken down off of the infundibulum and the infundibulum retracted laterally. We dissected out the cystic duct. I created a large window behind the  cystic duct. We attempted to make an incision in the cystic duct and found that the wall of the cystic duct was somewhat thickened and the lumen was essentially obliterated. We could not cannulate the cystic duct lumen with a catheter and abandoned the cholangiogram. The cystic duct was secured with multiple clips and divided. The cystic artery was isolated separately with an anterior branch and a posterior branch. These  Branches were isolated right as they went onto the wall of the gallbladder, secured with hemoclips and divided. The gallbladder was then dissected from its bed with electrocautery placed in a specimen bag and removed. One hole was made in the gallbladder  and clear bile spilled out but no stones. After removal of the gallbladder we irrigated the operative field copiously with saline until the irrigation fluid was completely clear. At this point  there was no bleeding or bile leak anywhere. We were satisfied. The trocars were removed and red vision. There was no bleeding at the trocar sites. Pneumoperitoneum was released. The fascia at the umbilicus was closed with 0- Vicryl sutures and skin incisions were closed with subcuticular sutures of 4-0 Monocryl and Dermabond. The patient tolerated the procedure well and was taken to recovery in stable condition. There were no complications. EBL 10 cc. Counts correct.    Angelia Mould. Derrell Lolling, M.D., FACS General and Minimally Invasive Surgery Breast and Colorectal Surgery  12/05/2011 1:15 PM

## 2011-12-05 NOTE — Anesthesia Preprocedure Evaluation (Signed)
Anesthesia Evaluation  Patient identified by MRN, date of birth, ID band Patient awake    Reviewed: Allergy & Precautions, H&P , NPO status , Patient's Chart, lab work & pertinent test results, reviewed documented beta blocker date and time   Airway Mallampati: II TM Distance: >3 FB Neck ROM: Full    Dental  (+) Dental Advisory Given and Teeth Intact   Pulmonary neg pulmonary ROS,  breath sounds clear to auscultation        Cardiovascular negative cardio ROS  Rhythm:Regular Rate:Normal  Neg heart w/u Jan 2013   Neuro/Psych negative neurological ROS  negative psych ROS   GI/Hepatic Neg liver ROS, Cholecystitis   Endo/Other  negative endocrine ROS  Renal/GU negative Renal ROS  negative genitourinary   Musculoskeletal   Abdominal   Peds negative pediatric ROS (+)  Hematology negative hematology ROS (+)   Anesthesia Other Findings Lower retainer Veneers all across front  Reproductive/Obstetrics Hx testicular cancer                           Anesthesia Physical Anesthesia Plan  ASA: II  Anesthesia Plan: General   Post-op Pain Management:    Induction: Intravenous  Airway Management Planned: Oral ETT  Additional Equipment:   Intra-op Plan:   Post-operative Plan: Extubation in OR  Informed Consent: I have reviewed the patients History and Physical, chart, labs and discussed the procedure including the risks, benefits and alternatives for the proposed anesthesia with the patient or authorized representative who has indicated his/her understanding and acceptance.   Dental advisory given  Plan Discussed with: CRNA and Surgeon  Anesthesia Plan Comments:         Anesthesia Quick Evaluation

## 2011-12-05 NOTE — Transfer of Care (Signed)
Immediate Anesthesia Transfer of Care Note  Patient: Charles Lowe  Procedure(s) Performed: Procedure(s) (LRB): LAPAROSCOPIC CHOLECYSTECTOMY (N/A)  Patient Location: PACU  Anesthesia Type: General  Level of Consciousness: awake, alert  and oriented  Airway & Oxygen Therapy: Patient Spontanous Breathing and Patient connected to face mask  Post-op Assessment: Report given to PACU RN, Post -op Vital signs reviewed and stable and Patient moving all extremities  Post vital signs: Reviewed and stable  Complications: No apparent anesthesia complications

## 2011-12-05 NOTE — Interval H&P Note (Signed)
History and Physical Interval Note:  12/05/2011 11:31 AM  Charles Lowe  has presented today for surgery, with the diagnosis of gallstones  The goals of treatment and the various methods of treatment have been discussed with the patient and family. After consideration of risks, benefits and other options for treatment, the patient has consented to  Procedure(s) (LRB): LAPAROSCOPIC CHOLECYSTECTOMY WITH INTRAOPERATIVE CHOLANGIOGRAM (N/A) as a surgical intervention .  The patients' history has been reviewed and the patient examined today,  There is no change in status, stable for surgery.  I have reviewed the patients' chart and labs.  Questions were answered to the patient's satisfaction.     [name]

## 2011-12-05 NOTE — Preoperative (Signed)
Beta Blockers   Reason not to administer Beta Blockers:Not Applicable Pt took Propranolol 12-05-11 at [facility]

## 2011-12-08 ENCOUNTER — Ambulatory Visit (INDEPENDENT_AMBULATORY_CARE_PROVIDER_SITE_OTHER): Payer: Managed Care, Other (non HMO) | Admitting: General Surgery

## 2011-12-08 ENCOUNTER — Encounter (INDEPENDENT_AMBULATORY_CARE_PROVIDER_SITE_OTHER): Payer: Self-pay | Admitting: General Surgery

## 2011-12-08 VITALS — BP 120/84 | HR 88 | Temp 98.8°F | Ht 71.0 in | Wt 183.8 lb

## 2011-12-08 DIAGNOSIS — K811 Chronic cholecystitis: Secondary | ICD-10-CM

## 2011-12-08 NOTE — Patient Instructions (Signed)
You are recovering without any obvious complication following your recent cholecystectomy. You had been given a copy of the pathology report today.  You may drive a car. No sports or lifting more than 20 pounds for 2 weeks. Unrestricted activities thereafter.  Low-fat diet advised.  Return to see Dr. Derrell Lolling if any further problems arise.

## 2011-12-08 NOTE — Progress Notes (Signed)
Subjective:     Patient ID: Charles Lowe, male   DOB: 08-Jul-1966, 46 y.o.   MRN: [id number]  HPI This patient underwent laparoscopic cholecystectomy on April 22 for chronic cholecystitis. His cystic duct was essentially obliterated and so we could not cannulate it for a cholangiogram. I think this explains his symptoms and his abnormal hepatobiliary scan. Final pathology report shows chronic acalculus cholecystitis.  The patient is doing well. He has stopped taking pain medicine. He has resume normal diet and is able to have normal bowel movements. He came in early because he wants to [place] back to [place] because that's where his current jaundice.  Review of Systems     Objective:   Physical Exam Patient looks well. In no distress.  Abdomen soft and nontender. Wounds are healing normally. A little bit of bruising around the umbilicus. Benign exam.    Assessment:     Chronic acalculus cholecystitis, uneventful recovery and early postop period following laparoscopic cholecystectomy.    Plan:     Recommend high fiber low fat diet and hydration.  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 Unrestricted activities thereafter.  Return to see me if any further problems arise.Angelia Mould. Derrell Lolling, M.D., [facility] General and Minimally invasive Surgery Breast and Colorectal Surgery Office:   [phone] Pager:   [phone]

## 2011-12-12 ENCOUNTER — Encounter (HOSPITAL_COMMUNITY): Payer: Self-pay | Admitting: General Surgery

## 2012-01-02 MED ORDER — KLONOPIN 1 MG TABLET
1 mg | ORAL_TABLET | ORAL | Status: DC
Start: 2012-01-02 — End: 2012-03-01

## 2012-01-02 MED ORDER — CLOMIPRAMINE 50 MG CAP
50 mg | ORAL_CAPSULE | Freq: Every day | ORAL | Status: DC
Start: 2012-01-02 — End: 2012-06-25

## 2012-01-02 NOTE — Progress Notes (Signed)
Patient is here today for med refills.

## 2012-01-02 NOTE — Progress Notes (Signed)
HISTORY OF PRESENT ILLNESS  John Friedman is a 46 y.o. male.  HPI  Infectious Disease Review   [name] here to talk about chronic fatigue. Insurance denied nuvigil, he has been on Anafranil for 2 months.  Feels good on the change.  His energy is good.  No med side effects (he reports possibly making essential tremor slightly worse, but he is not sure).    Neurological Review  He is here today to talk about essential tremor.  Symptoms started: long standing.  Stable.  No med side effects & taking regularly.  He is only using Propranolol prn, has not used recently.      Endocrine Review  Patient is seen for followup of hypothyroidism.  He started medication.  He stopped after consultation with his chronic fatigue specialist.  No med side effects.    GI Review  Patient has had GB removed since last visit.  Surgery 12/05/11.  No problems post op.  Doing well.  No abd pain, n/v, or diarrhea.        Current Outpatient Prescriptions   Medication Sig Dispense Refill   ??? clomiPRAMINE (ANAFRANIL) 50 mg capsule Take 3 Caps by mouth daily.  180 Cap  0   ??? pregabalin (LYRICA) 300 mg capsule Take 1 Cap by mouth daily.  90 Cap  3   ??? KLONOPIN 1 mg tablet 1 tab in am and 1/2 tab in evening  135 Tab  2   ??? cholecalciferol, vitamin D3, (VITAMIN D3) 2,000 unit Tab Take 1 Tab by mouth daily.         ??? multivitamin (ONE A DAY) tablet Take 1 Tab by mouth daily.         ??? Armodafinil (NUVIGIL) 150 mg Tab Take 300 mg by mouth nightly.  180 Tab  0   ??? propranolol (INDERAL) 20 mg tablet Take 1 Tab by mouth two (2) times a day.  180 Tab  0       No Known Allergies        Review of Systems   Constitutional: Positive for malaise/fatigue.   Eyes: Negative for blurred vision.   Respiratory: Negative for cough and shortness of breath.    Cardiovascular: Negative for chest pain, palpitations and leg swelling.   Genitourinary: Negative for frequency.   Skin: Negative for rash.   Neurological: Positive for tremors. Negative for focal weakness  and headaches.   Endo/Heme/Allergies: Does not bruise/bleed easily.   Psychiatric/Behavioral: Negative for depression. The patient is not nervous/anxious and does not have insomnia.        Physical Exam   Constitutional: No distress.        thin   Cardiovascular: Regular rhythm and normal heart sounds.    No murmur heard.  Pulmonary/Chest: Effort normal and breath sounds normal. He has no wheezes. He has no rales.   Abdominal: Soft. Bowel sounds are normal. He exhibits no mass. There is no hepatosplenomegaly. There is no tenderness.        Well healed surgical scars   Neurological:        Tremor  notes   Psychiatric: He has a normal mood and affect. His behavior is normal.       BP 108/76   Pulse 72   Resp 20   Ht 5' 10.75" (1.797 m)   Wt 178 lb 3.2 oz (80.831 kg)   BMI 25.03 kg/m2      ASSESSMENT and PLAN  John Friedman was seen today for  medication refill.  Diagnoses and associated orders for this visit:    Sleep disturbances- stable on new meds, 6 months provided  - clomiPRAMINE (ANAFRANIL) 50 mg capsule; Take 3 Caps by mouth daily.    Essential tremor- stable, cont current meds & prn BB  - KLONOPIN 1 mg tablet; 1 tab in am and 1/2 tab in evening    Hypothyroid- TSH elevated, we reviewed reasons to treat and why not to.  Will not repeat labs at this time as pt is not interested in treatment at this time.  He will review this his CFS and let me know.  I reviewed with him I would normally treat unless good data to show otherwise.    Chronic fatigue- stable, cont above meds          Follow-up Disposition:  Return in about 6 months (around 07/04/2012).   Advised him to call back or return to office if symptoms worsen/change/persist.  Discussed expected course/resolution/complications of diagnosis in detail with patient.    Medication risks/benefits/costs/interactions/alternatives discussed with patient.  He was given an after visit summary which includes diagnoses, current medications, & vitals.  He expressed understanding  with the diagnosis and plan.

## 2012-01-13 ENCOUNTER — Encounter (INDEPENDENT_AMBULATORY_CARE_PROVIDER_SITE_OTHER): Payer: Managed Care, Other (non HMO) | Admitting: General Surgery

## 2012-03-01 ENCOUNTER — Encounter

## 2012-03-01 MED ORDER — PREGABALIN 300 MG CAP
300 mg | ORAL_CAPSULE | Freq: Every day | ORAL | Status: DC
Start: 2012-03-01 — End: 2012-05-17

## 2012-03-01 MED ORDER — KLONOPIN 1 MG TABLET
1 mg | ORAL_TABLET | ORAL | Status: DC
Start: 2012-03-01 — End: 2012-05-17

## 2012-03-01 NOTE — Telephone Encounter (Signed)
Call pt and let him know Dr Amedeo Plenty sent med intially as Klonopin and should have received branded and need to call mail order to find out why generic sent.

## 2012-03-01 NOTE — Telephone Encounter (Signed)
LMFPTB   Lyrica was called into patient pharmacy and talked to pharmacist, KIM

## 2012-03-01 NOTE — Telephone Encounter (Signed)
Patient states that his Klonopin was sent as Clonazepam by mail order.  Patient needs it resent to mail order to be filled as Klonopin only.  He says Clonazepam does not work for him.    Patient needs a one week script for Lyrica sent to local [facility] ([address]  He doesn't think his mail order will arrive before he runs out.

## 2012-03-01 NOTE — Telephone Encounter (Signed)
Verbal order by Dr.Hayes for refill.

## 2012-03-06 NOTE — Telephone Encounter (Signed)
LMFPTCB

## 2012-03-08 NOTE — Telephone Encounter (Signed)
Pt stated he was told by his mail order company, Dr.Hayes would need to resend rx for branded Klonopin. Explained to pt it was sent electronically as branded, and would need to be approved by Dr.Hayes to be resent. Explained to pt Dr.Hayes would be back in the office tomorrow. Once discussed with him, we would get back him. Patient verbalized understanding.

## 2012-03-08 NOTE — Telephone Encounter (Signed)
Med waiting for Dr.Hayes' approval to be resent to mail order company. Pt aware.

## 2012-03-08 NOTE — Telephone Encounter (Signed)
Message copied by Pecola Lawless on Thu Mar 08, 2012 10:24 AM  ------       Message from: Rowan Blase       Created: Thu Mar 08, 2012 10:19 AM       Regarding: cph  returning call         [phone]              Returning Dishon Kehoe's call.

## 2012-03-09 NOTE — Telephone Encounter (Signed)
Notified pt rx ready to pick up at front desk. Patient verbalized understanding.

## 2012-03-09 NOTE — Telephone Encounter (Signed)
Order signed.  Will need to monitor [place] closely prior to next refill

## 2012-04-27 NOTE — Progress Notes (Signed)
Patient seen at [facility] yesterday for low blood pressure.

## 2012-04-27 NOTE — Progress Notes (Signed)
HISTORY OF PRESENT ILLNESS  John Friedman is a 46 y.o. male.  HPI  Hospital Follow Up  John Friedman is seen for follow up from recent ED visit to [facility] yesterday.   We reviewed the lab results, vital signs.  Records have been requested.   He was seen for pre-syncope & low BP and diagnosed with orthostatic hypotension.  He reports symptoms are slightly improved.   He was sitting in a meeting and felt pre-syncopal (felt lightheaded, weak, and numbness/burning sensation in head & both arms).  He reports having been seen at [facility] and BP was 70/40.  He reports in ED received IVF and observed.  He reports they attributed everything to his orthostatic hypotension. Today he feels slightly better.  He reports strong discomfort in his left upper chest for the last 10 days, it is constant but fluctuating in intensity         Current Outpatient Prescriptions   Medication Sig Dispense Refill   ??? pregabalin (LYRICA) 300 mg capsule Take 1 Cap by mouth daily.  7 Cap  0   ??? KLONOPIN 1 mg tablet 1 tab in am and 1/2 tab in evening  135 Tab  2   ??? clomiPRAMINE (ANAFRANIL) 50 mg capsule Take 3 Caps by mouth daily.  270 Cap  1   ??? propranolol (INDERAL) 20 mg tablet Take 1 Tab by mouth two (2) times a day.  180 Tab  0       No Known Allergies        Review of Systems   Constitutional: Positive for malaise/fatigue. Negative for diaphoresis.   Eyes: Negative for blurred vision.   Respiratory: Negative for cough.    Cardiovascular: Negative for chest pain and palpitations.   Gastrointestinal: Negative for nausea, vomiting and abdominal pain.       Physical Exam   Constitutional: No distress.   Cardiovascular: Regular rhythm and normal heart sounds.    No murmur heard.  Pulmonary/Chest: Effort normal and breath sounds normal. He has no wheezes. He has no rales. He exhibits no tenderness.       BP 144/100   Pulse 60   Ht 5' 10.75" (1.797 m)   Wt 184 lb 9.6 oz (83.734 kg)   BMI 25.93 kg/m2    REPEAT BP: 120/80       ASSESSMENT and PLAN  John Friedman was seen today for blood pressure check.  Diagnoses and associated orders for this visit:    Pre-syncope- new dx, unclear etiology, tend to favor orthostatic hypotension vs arrhythmia.  He will f/u with cardiology as set up by ED.  Red flags were reviewed with the patient to RTC or notify me. Will get rest of records    Chest pain- have low suspicious for PE, so will check labs since pain has been present for longer then current sx.  If no change will need some chest imaging  - D DIMER        Follow-up Disposition:  Return if symptoms worsen or fail to improve.   Advised him to call back or return to office if symptoms worsen/change/persist.  Discussed expected course/resolution/complications of diagnosis in detail with patient.    Medication risks/benefits/costs/interactions/alternatives discussed with patient.  He was given an after visit summary which includes diagnoses, current medications, & vitals.  He expressed understanding with the diagnosis and plan.

## 2012-05-01 LAB — D DIMER: D-Dimer: <0.2 ug FEU/mL (ref 0.0–0.4)

## 2012-05-01 LAB — D-DIMER, QUANTITATIVE: D-Dimer, Quant: <0.2 ug FEU/mL (ref 0.0–0.4)

## 2012-05-01 NOTE — Progress Notes (Signed)
Quick Note:    Please call patient. D-dimer was negative. Not a PE. Likely muscular. If he has not had a CXR he should have this done but I think they did this in the ED (but only if pain continues)  ______

## 2012-05-01 NOTE — Progress Notes (Signed)
Quick Note:    Advised pt per Dr.Hayes' note, regarding D-Dimer. Patient verbalized understanding, stated he is feeling "alittle better" and will call back if pain fails to improve for CXR order.  ______

## 2012-05-02 ENCOUNTER — Telehealth: Payer: Self-pay | Admitting: Hematology & Oncology

## 2012-05-02 NOTE — Telephone Encounter (Signed)
Patient called and cx 05/04/12 appt and resch for 07/11/12

## 2012-05-04 ENCOUNTER — Ambulatory Visit: Payer: Self-pay | Admitting: Hematology & Oncology

## 2012-05-04 ENCOUNTER — Ambulatory Visit: Payer: BC Managed Care – PPO | Admitting: Hematology & Oncology

## 2012-05-04 ENCOUNTER — Other Ambulatory Visit: Payer: Managed Care, Other (non HMO) | Admitting: Lab

## 2012-05-10 NOTE — Telephone Encounter (Signed)
I would do 1.5 tabs on the weekend and only for 1-2 weeks.  This should not be a long term need and it is curious that it is only happening on the weekends.  Notify me after 1-2 wks with an update.

## 2012-05-10 NOTE — Telephone Encounter (Signed)
Message copied by Pecola Lawless on Thu May 10, 2012  8:46 AM  ------       Message from: Renaee Munda P       Created: Wed May 09, 2012  5:03 PM       Regarding: FW: cph          med increase         i was under the impress he was taking this daily.  How exactly is he taking it?  What else has changed, this medication should not just stop working or wear off?              ----- Message -----          From: Pecola Lawless, LPN          Sent: [id number]   3:31 PM            To: Antony Haste, MD       Subject: FW: cph          med increase                              Ok to increase?       ----- Message -----          From: Lacretia Nicks          Sent: 05/09/2012   3:13 PM            To: [employer] Pool       Subject: cph          med increase                                  [phone] pt says he takes lyrica to help him sleep and not sleep walk, the past two week ends he did sleep walk and fell one of those times.  He is requesting an increase in his lyrica to 300mg  a day every day including sat and sun.

## 2012-05-10 NOTE — Telephone Encounter (Signed)
Advised pt per Dr.Hayes' note, pt reported he is currently taking Lyrica 300mg  QHS. [name] requesting to increase the dose on sat and sun to 300mg  "during the day" and 300mg  QHS. Stated he would like to have a total of 600mg  on the weekends. Also will need a refill if Dr.Hayes approves.

## 2012-05-10 NOTE — Telephone Encounter (Signed)
LMTCB

## 2012-05-17 MED ORDER — PREGABALIN 75 MG CAP
75 mg | ORAL_CAPSULE | ORAL | Status: DC
Start: 2012-05-17 — End: 2012-06-25

## 2012-05-17 MED ORDER — KLONOPIN 1 MG TABLET
1 mg | ORAL_TABLET | ORAL | Status: AC
Start: 2012-05-17 — End: 2012-08-17

## 2012-05-17 NOTE — Progress Notes (Signed)
HISTORY OF PRESENT ILLNESS  John Friedman is a 46 y.o. male.  HPI  He wants to talk about sleep.  He reports sleep disturbances since the 90's.  He has had issues w/ sleep walking & "thrashing around" during his dreams.  He has been on Lyrica 300mg  daily with good control.   He reports 3 weeks ago he was sleep walking & fell, did not hit his head.  He reports increase in stress & sleep deprivation.  No fevers, h/a, cough, SOB, or changes in urinary sx.  He is concerned since he sleeps all day for the whole weekend.      Current Outpatient Prescriptions   Medication Sig Dispense Refill   ??? pregabalin (LYRICA) 300 mg capsule Take 1 Cap by mouth daily.  7 Cap  0   ??? KLONOPIN 1 mg tablet 1 tab in am and 1/2 tab in evening  135 Tab  2   ??? clomiPRAMINE (ANAFRANIL) 50 mg capsule Take 3 Caps by mouth daily.  270 Cap  1   ??? propranolol (INDERAL) 20 mg tablet Take 1 Tab by mouth two (2) times a day.  180 Tab  0       No Known Allergies        Review of Systems   Respiratory: Negative for cough.    Cardiovascular: Negative for chest pain and palpitations.   Neurological: Positive for tremors.   Psychiatric/Behavioral: Negative for depression. The patient is not nervous/anxious and does not have insomnia.        Physical Exam   Constitutional: No distress.   Cardiovascular: Regular rhythm and normal heart sounds.    No murmur heard.  Pulmonary/Chest: Effort normal and breath sounds normal. He has no wheezes. He has no rales.   Psychiatric: He has a normal mood and affect. His behavior is normal.       Pulse 108   Ht 5' 10.75" (1.797 m)   Wt 175 lb (79.379 kg)   BMI 24.58 kg/m2   SpO2 95%      ASSESSMENT and PLAN  John Friedman was seen today for medication evaluation.  Diagnoses and associated orders for this visit:    Sleep disturbances- worsening on the weekend, needs to work on stress relief, will increase lyrica to 450mg  on the weekends, samples provided, read flags reviewed, will have him see neurologist  - pregabalin  (LYRICA) 75 mg capsule; 2 tabs on Saturday & Sunday in conjunction with 300mg   - pregabalin (LYRICA) 300 mg capsule; Take 1 Cap by mouth daily.  - REFERRAL TO NEUROLOGY    Essential tremor- stable, he received generic clonazepam, script re-written for brand name, he turning in previous bottle  - KLONOPIN 1 mg tablet; 1 tab in am and 1/2 tab in evening  - REFERRAL TO NEUROLOGY    Blood pressure check- could not measure, pt is asymptomatic, he will have [place] [facility] check if any symptoms    Medication monitoring encounter- controlled substance agreement signed, UDS in ED last month was negative, so will repeat.  He recently had refills of klonopin, he exchanged these for a new prescription, his pills will be counted.  [id number]+OXYCODONE+CRT-UNBUND    Leukopenia- he reports baseline is 3,800 who ever no recent labs to compare so will check to verify stablility  - CBC WITH AUTOMATED DIFF          Follow-up Disposition: Not on File   Advised him to call back or return to  office if symptoms worsen/change/persist.  Discussed expected course/resolution/complications of diagnosis in detail with patient.    Medication risks/benefits/costs/interactions/alternatives discussed with patient.  He was given an after visit summary which includes diagnoses, current medications, & vitals.  He expressed understanding with the diagnosis and plan.

## 2012-05-17 NOTE — Progress Notes (Signed)
Patient is here to discuss medication issues.

## 2012-05-22 ENCOUNTER — Encounter

## 2012-05-22 NOTE — Telephone Encounter (Signed)
Patient is going out of town on the 20th of of this month and needs to have this by the 18th.  Please do this today.

## 2012-05-23 MED ORDER — PREGABALIN 300 MG CAP
300 mg | ORAL_CAPSULE | Freq: Every day | ORAL | Status: DC
Start: 2012-05-23 — End: 2012-05-25

## 2012-05-23 NOTE — Telephone Encounter (Signed)
Medication refilled to [place].  However, he was just given 3 month supply to local pharmacy earlier this month, so I'm not sure what the rush is

## 2012-05-24 ENCOUNTER — Encounter

## 2012-05-24 NOTE — Telephone Encounter (Signed)
Pt. Called and voiced his frustration over having to wait for his lyrica refill to be signed by Dr Amedeo Plenty. Informed Pt. This is a controlled drug and can only be refilled for a max qty 90 1 rf and he needs to be requesting the next refill when he is on the last 30d if he uses mail order because of the 14d delivery time and they will not ship early. Pt. May also choose to use a local pharmacy if his insurance allows it and he would not have to wait. Pt. Informed this refill needs to be printed and the hard copy sent wherever he fills it. I verified with [facility] he did fill it 11/10/11 and 02/28/12. Pt. States he will be here at 8:00am to pick up current refill. Pt. Also complains of a sore throat and requested an appt. For next week, scheduled 11:00am mon. With Dr Amedeo Plenty.

## 2012-05-25 MED ORDER — PREGABALIN 300 MG CAP
300 mg | ORAL_CAPSULE | Freq: Every day | ORAL | Status: DC
Start: 2012-05-25 — End: 2012-06-25

## 2012-05-25 NOTE — Addendum Note (Signed)
Addended by: Antony Haste on: 05/25/2012 07:30 AM     Modules accepted: Orders

## 2012-05-30 NOTE — Progress Notes (Signed)
Patient c/o pain with swallowing (on left side).

## 2012-05-31 LAB — AMB POC RAPID STREP A: Group A Strep Ag: NEGATIVE

## 2012-06-06 NOTE — Progress Notes (Signed)
Pt seen on 05/30/12.  The documentation for this period is being entered following the guidelines as defined in the [facility] downtime policy by Antony Haste, MD.  Dx with sore throat, refer to ENT.  See scanned document.

## 2012-06-18 NOTE — Telephone Encounter (Signed)
Gave pt Dr.Van @ [place] office contact number to schedule an appt. Requested records from [facility], [place] location.

## 2012-06-18 NOTE — Telephone Encounter (Signed)
Message copied by Pecola Lawless on Mon Jun 18, 2012  1:58 PM  ------       Message from: Renaee Munda P       Created: Mon Jun 18, 2012 12:26 PM       Regarding: FW: cph    recommendation         Ortho will likely due xrays and send him to PT unless there is something really wrong.  He has never talked to me about this.              If he wants to go to ortho that is fine.  Please get [facility] records.              [place]: Dr Lucianne Lei [phone])       Lizabeth LeydenBlase Mess [phone])               ----- Message -----          From: Pecola Lawless, LPN          Sent: [id number]   9:41 AM            To: Antony Haste, MD       Subject: [name]: cph    recommendation                                  Please advise       ----- Message -----          From: Renato Shin          Sent: 06/18/2012   8:37 AM            To: [employer] Team [employer] Pool       Subject: cph    recommendation                                      [phone]  Patient would like a recommendation of an orthopedic doctor for his back.  Says he is working very long hours right now and needs to "cut right to the chase" and not come here first, but wants to go straight to an ortho.  Went to urgent care over the weekend and was given painkillers.                ------

## 2012-06-25 MED ORDER — PREGABALIN 300 MG CAP
300 mg | ORAL_CAPSULE | Freq: Every day | ORAL | Status: DC
Start: 2012-06-25 — End: 2012-12-04

## 2012-06-25 MED ORDER — HYDROCODONE-ACETAMINOPHEN 5 MG-325 MG TAB
5-325 mg | ORAL_TABLET | Freq: Three times a day (TID) | ORAL | Status: DC | PRN
Start: 2012-06-25 — End: 2013-05-21

## 2012-06-25 MED ORDER — CLOMIPRAMINE 50 MG CAP
50 mg | ORAL_CAPSULE | Freq: Every day | ORAL | Status: DC
Start: 2012-06-25 — End: 2012-12-04

## 2012-06-25 NOTE — Patient Instructions (Signed)
Low Back Pain: Exercises  Your Care Instructions  Here are some examples of typical rehabilitation exercises for your condition. Start each exercise slowly. Ease off the exercise if you start to have pain.  Your doctor or physical therapist will tell you when you can start these exercises and which ones will work best for you.  How to do the exercises  Press-up    1. Lie on your stomach, supporting your body with your forearms.  2. Press your elbows down into the floor to raise your upper back. As you do this, relax your stomach muscles and allow your back to arch without using your back muscles. As your press up, do not let your hips or pelvis come off the floor.  3. Hold for 15 to 30 seconds, then relax.  4. Repeat 2 to 4 times.  Alternate arm and leg (bird dog) exercise    Note: Do this exercise slowly. Try to keep your body straight at all times, and do not let one hip drop lower than the other.  1. Start on the floor, on your hands and knees.  2. Tighten your belly muscles.  3. Raise one leg off the floor, and hold it straight out behind you. Be careful not to let your hip drop down, because that will twist your trunk.  4. Hold for about 6 seconds, then lower your leg and switch to the other leg.  5. Repeat 8 to 12 times on each leg.  6. Over time, work up to holding for 10 to 30 seconds each time.  7. If you feel stable and secure with your leg raised, try raising the opposite arm straight out in front of you at the same time.  Knee-to-chest exercise    1. Lie on your back with your knees bent and your feet flat on the floor.  2. Bring one knee to your chest, keeping the other foot flat on the floor (or keeping the other leg straight, whichever feels better on your lower back).  3. Keep your lower back pressed to the floor. Hold for at least 15 to 30 seconds.  4. Relax, and lower the knee to the starting position.  5. Repeat with the other leg. Repeat 2 to 4 times with each leg.  6. To get more stretch, put  your other leg flat on the floor while pulling your knee to your chest.  Curl-ups    1. Lie on the floor on your back with your knees bent at a 90-degree angle. Your feet should be flat on the floor, about 12 inches from your buttocks.  2. Cross your arms over your chest.  3. Slowly tighten your belly muscles and raise your shoulder blades off the floor.  4. Keep your head in line with your body, and do not press your chin to your chest.  5. Hold this position for 1 or 2 seconds, then slowly lower yourself back down to the floor.  6. Repeat 8 to 12 times.  Pelvic tilt exercise    1. Lie on your back with your knees bent.  2. "Brace" your stomach. This means to tighten your muscles by pulling in and imagining your belly button moving toward your spine. You should feel like your back is pressing to the floor and your hips and pelvis are rocking back.  3. Hold for about 6 seconds while you breathe smoothly.  4. Repeat 8 to 12 times.  Heel dig bridging    1.   Lie on your back with both knees bent and your ankles bent so that only your heels are digging into the floor. Your knees should be bent about 90 degrees.  2. Then push your heels into the floor, squeeze your buttocks, and lift your hips off the floor until your shoulders, hips, and knees are all in a straight line.  3. Hold for about 6 seconds as you continue to breathe normally, and then slowly lower your hips back down to the floor and rest for up to 10 seconds.  4. Do 8 to 12 repetitions.  Hamstring stretch in doorway    1. Lie on your back in a doorway, with one leg through the open door.  2. Slide your leg up the wall to straighten your knee. You should feel a gentle stretch down the back of your leg.  3. Hold the stretch for at least 15 to 30 seconds. Do not arch your back, point your toes, or bend either knee. Keep one heel touching the floor and the other heel touching the wall.  4. Repeat with your other leg.  5. Do 2 to 4 times for each leg.  Hip flexor  stretch    1. Kneel on the floor with one knee bent and one leg behind you. Place your forward knee over your foot. Keep your other knee touching the floor.  2. Slowly push your hips forward until you feel a stretch in the upper thigh of your rear leg.  3. Hold the stretch for at least 15 to 30 seconds. Repeat with your other leg.  4. Do 2 to 4 times on each side.  Wall sit    1. Stand with your back 10 to 12 inches away from a wall.  2. Lean into the wall until your back is flat against it.  3. Slowly slide down until your knees are slightly bent, pressing your lower back into the wall.  4. Hold for about 6 seconds, then slide back up the wall.  5. Repeat 8 to 12 times.  Follow-up care is a key part of your treatment and safety. Be sure to make and go to all appointments, and call your doctor if you are having problems. It's also a good idea to know your test results and keep a list of the medicines you take.   Where can you learn more?   Go to [employer].cy  Enter [phone] in the search box to learn more about "Low Back Pain: Exercises."   ?? 2006-2013 Healthwise, Incorporated. Care instructions adapted under license by [employer] (which disclaims liability or warranty for this information). This care instruction is for use with your licensed healthcare professional. If you have questions about a medical condition or this instruction, always ask your healthcare professional. [place] any warranty or liability for your use of this information.  Content Version: 9.8.193578; Last Revised: June 29, 2010

## 2012-06-25 NOTE — Progress Notes (Signed)
Follow up for back pain and medication refills.

## 2012-06-25 NOTE — Progress Notes (Signed)
HISTORY OF PRESENT ILLNESS  John Friedman is a 46 y.o. male.  HPI  Orthopedic Review  John Friedman is a 46 y.o. male who presents do to back pain. This is a new problem, symptoms started on 10/29th after lifting some heavy suitcases when traveling.  Since then symptoms have been changing, and have been present for 2 weeks.  He reports pain is the left lower back is described as "pain".  He is now having "a lot of pain" in the left groin & crease area.  He is having a hard time describing.  Symptoms are constant.  Alleviating factors identifiable by patient are medication: lortab helps slightly.  Exacerbating factors identifiable by patient are bending forward bothers the back but not the groin.  Treatments so far initiated by patient: he did go to [facility] on 11/3.  He reports they were unsure of the etiology.  Records reviewed, limited dictation.  Xray was personally reviewed w/ patient..  He has appt w/ Dr Lucianne Lei Margaretann Loveless) next week.          Current Outpatient Prescriptions   Medication Sig Dispense Refill   ??? HYDROcodone-acetaminophen (NORCO) 5-325 mg per tablet Take 1 Tab by mouth every eight (8) hours as needed for Pain.       ??? pregabalin (LYRICA) 300 mg capsule Take 1 Cap by mouth daily.  90 Cap  1   ??? KLONOPIN 1 mg tablet 1 tab in am and 1/2 tab in evening  135 Tab  0   ??? clomiPRAMINE (ANAFRANIL) 50 mg capsule Take 3 Caps by mouth daily.  270 Cap  1   ??? propranolol (INDERAL) 20 mg tablet Take 1 Tab by mouth two (2) times a day.  180 Tab  0       No Known Allergies        Review of Systems   Constitutional: Negative for fever, chills and weight loss.   Respiratory: Negative for cough and shortness of breath.    Cardiovascular: Negative for chest pain and palpitations.   Gastrointestinal: Negative for nausea, vomiting, abdominal pain, diarrhea and constipation.   Genitourinary: Negative for dysuria, urgency, frequency and hematuria.   Musculoskeletal: Positive for back pain.   Neurological: Negative for  tingling and focal weakness.       Physical Exam   Cardiovascular: Regular rhythm and normal heart sounds.    No murmur heard.  Pulmonary/Chest: Effort normal and breath sounds normal. He has no wheezes. He has no rales.   Abdominal: Soft. Bowel sounds are normal. He exhibits no mass. There is no hepatosplenomegaly. There is no tenderness. Hernia confirmed negative in the left inguinal area.   Slight swelling on left inguinal area w/ valsalva, no distinct mass/buldge    Genitourinary:   Left artificial testicle.   Musculoskeletal:        Lumbar back: He exhibits no tenderness, no bony tenderness, no deformity, no pain and no spasm.   Lymphadenopathy:        Left: No inguinal adenopathy present.   Neurological:   Strength is 5/5 in lower extremities. Intact to light touch in lower extremities.        BP 100/68   Pulse 100   Ht 5' 10.75" (1.797 m)   Wt 174 lb 9.6 oz (79.198 kg)   BMI 24.53 kg/m2      ASSESSMENT and PLAN  John Friedman was seen today for back pain.  Diagnoses and associated orders for this visit:    Back pain,  acute- new dx, in conjunction w/ groin pain is concerning for thoracic nerve impingement vs hernia vs kidney stone.  Pt's history is vague but occuring after lifting tend to favor muscular and/or radiculopathy.  However, will  get CT scan to r/o hernia or stone due to inconclusive history & exam.  meds refilled.  Will f/u with ortho if no obvious causes.  - CT ABD PELV WO CONT; Future  - HYDROcodone-acetaminophen (NORCO) 5-325 mg per tablet; Take 1 Tab by mouth every eight (8) hours as needed for Pain.    Left groin pain- new dx, in conjunction w/ back pain is is concerning for thoracic nerve impingement vs hernia vs kidney stone.  Work up as above  - CT ABD PELV WO CONT; Future    Testicular cancer- no evidence of lesions on spine, doubt related, but pt is very concerned    Sleep disturbances- meds refilled (pt reports taking Lyrica to local pharmacy & only given #30)  - pregabalin (LYRICA) 300 mg  capsule; Take 1 Cap by mouth daily.  - clomiPRAMINE (ANAFRANIL) 50 mg capsule; Take 3 Caps by mouth daily.        Follow-up Disposition:  Return if symptoms worsen or fail to improve.   Advised him to call back or return to office if symptoms worsen/change/persist.  Discussed expected course/resolution/complications of diagnosis in detail with patient.    Medication risks/benefits/costs/interactions/alternatives discussed with patient.  He was given an after visit summary which includes diagnoses, current medications, & vitals.  He expressed understanding with the diagnosis and plan.

## 2012-07-11 ENCOUNTER — Other Ambulatory Visit: Payer: Managed Care, Other (non HMO) | Admitting: Lab

## 2012-07-11 ENCOUNTER — Other Ambulatory Visit: Payer: Self-pay | Admitting: *Deleted

## 2012-07-11 ENCOUNTER — Other Ambulatory Visit (HOSPITAL_BASED_OUTPATIENT_CLINIC_OR_DEPARTMENT_OTHER): Payer: Managed Care, Other (non HMO)

## 2012-07-11 ENCOUNTER — Ambulatory Visit (HOSPITAL_BASED_OUTPATIENT_CLINIC_OR_DEPARTMENT_OTHER): Payer: Managed Care, Other (non HMO) | Admitting: Hematology & Oncology

## 2012-07-11 VITALS — BP 108/74 | HR 99 | Temp 98.6°F | Resp 18 | Ht 71.0 in | Wt 170.0 lb

## 2012-07-11 DIAGNOSIS — C6290 Malignant neoplasm of unspecified testis, unspecified whether descended or undescended: Secondary | ICD-10-CM

## 2012-07-11 DIAGNOSIS — Z8547 Personal history of malignant neoplasm of testis: Secondary | ICD-10-CM

## 2012-07-11 DIAGNOSIS — E291 Testicular hypofunction: Secondary | ICD-10-CM

## 2012-07-11 DIAGNOSIS — E785 Hyperlipidemia, unspecified: Secondary | ICD-10-CM

## 2012-07-11 LAB — CBC WITH DIFFERENTIAL (CANCER CENTER ONLY)
BASO%: 0.5 % (ref 0.0–2.0)
EOS%: 1.8 % (ref 0.0–7.0)
HCT: 38.9 % (ref 38.7–49.9)
LYMPH%: 33 % (ref 14.0–48.0)
MCHC: 34.7 g/dL (ref 32.0–35.9)
MCV: 91 fL (ref 82–98)
MONO#: 0.5 10*3/uL (ref 0.1–0.9)
MONO%: 12.4 % (ref 0.0–13.0)
NEUT%: 52.3 % (ref 40.0–80.0)
Platelets: 175 10*3/uL (ref 145–400)
RDW: 13.1 % (ref 11.1–15.7)
WBC: 4.4 10*3/uL (ref 4.0–10.0)

## 2012-07-11 NOTE — Progress Notes (Signed)
Lab orders entered after reviewing with Dr Myna Hidalgo.

## 2012-07-11 NOTE — Progress Notes (Signed)
This office note has been dictated.

## 2012-07-12 NOTE — Progress Notes (Signed)
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  CURRENT THERAPY:  Observation.  INTERVAL HISTORY:  Charles Lowe comes in for his yearly visit.  He now lives in [place].  He works for [employer].  He is enjoying this.  He did go to [facility], I think about a month so ago.  He had a good time out there.  He did have his gallbladder taken out.  This was in, I think, April.  He did not have gallstones, but he had problems with his cystic duct. Since his gallbladder has been taken out, he does feel better.  He still has tremors.  He has been looking into this.  He is thinking about Gamma Knife therapy.  I told him that [facility] has the best [facility] department in the country, so that would probably be my first recommendation for him.  Again, he is 10 years cancer free.  I do have to believe that he is cured.  His last tumor markers were normal.  Alpha fetoprotein was 7.1, beta hCG was less than 0.5.  His LDH was 158.  He has had no cough.  He has had occasional diarrhea.  This may be from having his gallbladder taken out.  PHYSICAL EXAMINATION:  General:  This is a thin, but well-nourished white gentleman in no obvious distress.  Vital signs:  98.6, pulse 80, respiratory rate 18, blood pressure 108/74.  Weight is 170.  Head and neck:  Normocephalic, atraumatic skull.  There are no ocular or oral lesions.  There are no palpable cervical or supraclavicular lymph nodes. Lungs:  Clear bilaterally.  Cardiac:  Regular rate and rhythm with a normal S1 and S2.  There are no murmurs, rubs, or bruits.  Abdomen: Soft with good bowel sounds.  There is no palpable abdominal mass. There is no fluid wave.  His laparoscopy scar is well-healed.  There is no palpable hepatosplenomegaly.  Extremities:  No clubbing, cyanosis or edema.  He has good range of motion of his joints.  Back:  No tenderness over the spine, ribs, or hips.  Neurologic:  Does  show the tremor.  LABORATORY STUDIES:  White cell count is 4.4, hemoglobin 13.5, hematocrit 38.9, platelet count 175.  IMPRESSION:  Charles Lowe is a 46 year old gentleman with a history of stage III non-seminomatous germ cell tumor of the left testicle.  He underwent orchiectomy.  He had, I think, 3 cycles of BEP.  Again, I see no evidence of recurrent disease.  I do not see that we need to do scans on him.  He is getting close to 48.  He will need his colonoscopy to be done.  We also have to make sure that his PSA will be checked.  I am just glad that he is working.  He loves what he does.  He is an Dance movement psychotherapist.  I have no clue what this is, but I know it has to do with something with figuring out how long people live.    ______________________________ Josph Macho, M.D. PRE/MEDQ  D:  07/11/2012  T:  07/12/2012  Job:  [id number]

## 2012-07-14 LAB — VITAMIN D 25 HYDROXY (VIT D DEFICIENCY, FRACTURES): Vit D, 25-Hydroxy: 14 ng/mL — ABNORMAL LOW (ref 30–89)

## 2012-07-14 LAB — AFP TUMOR MARKER: AFP-Tumor Marker: 4.7 ng/mL (ref 0.0–8.0)

## 2012-07-19 ENCOUNTER — Telehealth: Payer: Self-pay | Admitting: *Deleted

## 2012-07-19 NOTE — Telephone Encounter (Signed)
Called patient to let him know that his vitamin d level is low per dr. Myna Hidalgo.  Told patient to take 2000 mg daily.  Patient understands instructions

## 2012-07-19 NOTE — Telephone Encounter (Signed)
Message copied by Anselm Jungling on Thu Jul 19, 2012  9:50 AM ------      Message from: Josph Macho      Created: Fri Jul 13, 2012  3:41 PM       call - labs are ok but the Vit D is low!! He needs to take 2000 unit a day!!!!!  Cindee Lame

## 2012-08-10 NOTE — Progress Notes (Signed)
Patient did not schedule CT scan.  He went to ortho.  Received notes from pain specialist. CT cancelled.

## 2012-08-17 NOTE — Progress Notes (Signed)
Verified with the patient that he wants to continue using branded Klonopin, not switch to generic.  He is aware that generic is less expensive and prefers to pay out-of-pocket for the more expensive Klonopin.  (We had received a medication change authorization form from [facility] and I called the patient)

## 2012-10-02 MED ORDER — PROPRANOLOL 20 MG TAB
20 mg | ORAL_TABLET | Freq: Two times a day (BID) | ORAL | Status: DC
Start: 2012-10-02 — End: 2013-04-10

## 2012-10-02 MED ORDER — ARMODAFINIL 150 MG TABLET
150 mg | ORAL_TABLET | ORAL | Status: DC | PRN
Start: 2012-10-02 — End: 2012-12-04

## 2012-10-02 MED ORDER — KLONOPIN 1 MG TABLET
1 mg | ORAL_TABLET | ORAL | Status: DC
Start: 2012-10-02 — End: 2012-12-04

## 2012-10-02 NOTE — Progress Notes (Signed)
HISTORY OF PRESENT ILLNESS  John Friedman is a 47 y.o. male.  HPI  Infectious Disease Review   [name] here to talk about chronic fatigue.  Anafranil & Klonopin works for well, using daily (including weekend).  We did try to increase his Lyrica during the weekend but did not notice much of a change.  Feels good.  He is sleeping the majority of the weekend (stable x 18 yrs) and is sleeping 5 hrs a night during the week.     Neurological Review   He is here today to talk about essential tremor. Symptoms started: long standing. Stable. No med side effects & taking regularly. He is only using Propranolol during the week, but not so much on the weekends.  He is using more frequently, mostly once daily and occasionally BID.  He reports registering for a trial at [facility] for focused [place] to treat ET.     Endocrine Review   Patient is seen for followup of hypothyroidism. He reviewed with his CFS and decided not to take it.      Current Outpatient Prescriptions   Medication Sig Dispense Refill   ??? HYDROcodone-acetaminophen (NORCO) 5-325 mg per tablet Take 1 Tab by mouth every eight (8) hours as needed for Pain.  30 Tab  0   ??? pregabalin (LYRICA) 300 mg capsule Take 1 Cap by mouth daily.  90 Cap  1   ??? clomiPRAMINE (ANAFRANIL) 50 mg capsule Take 3 Caps by mouth daily.  270 Cap  1   ??? propranolol (INDERAL) 20 mg tablet Take 1 Tab by mouth two (2) times a day.  180 Tab  0       No Known Allergies        Review of Systems   Constitutional: Positive for malaise/fatigue. Negative for weight loss.   Respiratory: Negative for cough and shortness of breath.    Cardiovascular: Negative for chest pain and palpitations.   Gastrointestinal: Negative for nausea, vomiting, abdominal pain, diarrhea and constipation.   Musculoskeletal:        Back pain resolved after injection by Dr Laverta [place]       Physical Exam   Constitutional:   thin   Cardiovascular: Regular rhythm and normal heart sounds.    No murmur heard.  Pulmonary/Chest: Effort normal  and breath sounds normal. He has no wheezes. He has no rales.   Neurological: He displays no tremor.   Psychiatric: He has a normal mood and affect. His behavior is normal.       BP 124/80   Pulse 62   Ht 5' 10.75" (1.797 m)   Wt 175 lb 12.8 oz (79.742 kg)   BMI 24.69 kg/m2      ASSESSMENT and PLAN  [name] was seen today for fatigue.  Diagnoses and associated orders for this visit:    Chronic fatigue- stable, cont current meds, will give 30 tabs of Nuvigil to use when studying or needing extra concentration, he is aware this must last ~ 1 yr  - Armodafinil (NUVIGIL) 150 mg tab; Take 1 Tab by mouth as needed.    Essential tremor- stable, [place] schedule BB, cont benzo  - KLONOPIN 1 mg tablet; 1 tab in am and 1/2 tab in evening  - propranolol (INDERAL) 20 mg tablet; Take 1 Tab by mouth two (2) times a day.    Sleep disturbances- stable, decrease Lyrica back to regular dose    Lumbar radiculopathy- resolved  Follow-up Disposition:  Return in about 3 months (around 12/30/2012).   Advised him to call back or return to office if symptoms worsen/change/persist.  Discussed expected course/resolution/complications of diagnosis in detail with patient.    Medication risks/benefits/costs/interactions/alternatives discussed with patient.  He was given an after visit summary which includes diagnoses, current medications, & vitals.  He expressed understanding with the diagnosis and plan.

## 2012-10-02 NOTE — Progress Notes (Signed)
Follow up for chronic fatigue.

## 2012-12-04 MED ORDER — MODAFINIL 200 MG TAB
200 mg | ORAL_TABLET | Freq: Every day | ORAL | Status: DC
Start: 2012-12-04 — End: 2012-12-26

## 2012-12-04 MED ORDER — PREGABALIN 300 MG CAP
300 mg | ORAL_CAPSULE | Freq: Every day | ORAL | Status: DC
Start: 2012-12-04 — End: 2013-06-26

## 2012-12-04 MED ORDER — CLOMIPRAMINE 50 MG CAP
50 mg | ORAL_CAPSULE | Freq: Every day | ORAL | Status: DC
Start: 2012-12-04 — End: 2019-01-21

## 2012-12-04 MED ORDER — KLONOPIN 1 MG TABLET
1 mg | ORAL_TABLET | ORAL | Status: AC
Start: 2012-12-04 — End: 2013-03-06

## 2012-12-04 NOTE — Progress Notes (Signed)
HISTORY OF PRESENT ILLNESS  John Friedman is a 47 y.o. male.  HPI  Orthopedic Review  John Friedman is a 47 y.o. male who presents do to back pain. This is a acute on chronic problem, symptoms have unchanged, and have been present for 1 week.  He describes the pain as minimal.  He reports lifting a case of water out of his trunk.  He reports having some brief numbness in fingers/toes.  He denies any lumbar or cervical pain.  He has scheduled a f/u appt with ortho (Dr Lucianne Lei) next week.  Treatments so far initiated by patient: nothing.      Neurological Review  He is here today to talk about tremor, sleep walking/thrashing and CFS.  This is a chronic problem, symptoms have unchanged, and have been present for years.  Taking meds as directed & without side effects.          Prior to Admission medications    Medication Sig Start Date End Date Taking? Authorizing Provider   KLONOPIN 1 mg tablet 1 tab in am and 1/2 tab in evening 10/02/12 01/02/13 Yes Antony Haste, MD   propranolol (INDERAL) 20 mg tablet Take 1 Tab by mouth two (2) times a day. 10/02/12  Yes Antony Haste, MD   Armodafinil (NUVIGIL) 150 mg tab Take 1 Tab by mouth as needed. 10/02/12  Yes Antony Haste, MD   HYDROcodone-acetaminophen [facility]) 5-325 mg per tablet Take 1 Tab by mouth every eight (8) hours as needed for Pain. 06/25/12  Yes Antony Haste, MD   pregabalin (LYRICA) 300 mg capsule Take 1 Cap by mouth daily. 06/25/12  Yes Antony Haste, MD   clomiPRAMINE (ANAFRANIL) 50 mg capsule Take 3 Caps by mouth daily. 06/25/12  Yes Antony Haste, MD          No Known Allergies               Review of Systems   Constitutional: Positive for malaise/fatigue. Negative for weight loss.   Respiratory: Negative for cough and shortness of breath.    Cardiovascular: Negative for chest pain and palpitations.   Gastrointestinal: Negative for nausea, vomiting, abdominal pain, diarrhea and constipation.   Musculoskeletal: Negative for back  pain and joint pain.   Neurological: Positive for tingling and tremors.   Psychiatric/Behavioral: The patient does not have insomnia.        Physical Exam   Constitutional: No distress.   Cardiovascular: Regular rhythm and normal heart sounds.    No murmur heard.  Pulmonary/Chest: Effort normal and breath sounds normal. He has no wheezes. He has no rales.   Abdominal: Soft. Bowel sounds are normal. He exhibits no mass. There is no hepatosplenomegaly. There is no tenderness.   Musculoskeletal:        Lumbar back: He exhibits normal range of motion, no tenderness, no bony tenderness, no pain and no spasm.   Neurological:   Slight tremor: head, hands, feet         BP 90/62   Pulse 94   Ht 5' 10.75" (1.797 m)   Wt 177 lb 6.4 oz (80.468 kg)   BMI 24.92 kg/m2   SpO2 96%      ASSESSMENT and PLAN  John Friedman was seen today for medication refill.  Diagnoses and associated orders for this visit:    Essential tremor- stable, [place], meds ref[place]  - KLONOPIN 1 mg tablet; 1 tab in am and 1/2 tab in evening  Sleep disturbances- stable, cont current meds  - clomiPRAMINE (ANAFRANIL) 50 mg capsule; Take 3 Caps by mouth daily.  - pregabalin (LYRICA) 300 mg capsule; Take 1 Cap by mouth daily.    Chronic fatigue- stable, cont current meds, will try changing Nuvigil to Provigil to use sparingly   - KLONOPIN 1 mg tablet; 1 tab in am and 1/2 tab in evening  - modafinil (PROVIGIL) 200 mg tablet; Take 1 Tab by mouth daily.    Lumbar radiculopathy- recurrent, mild & improving, recommended heat & stretching, if improves can cancel f/u with ortho (reviewed likely muscular).  Red flags were reviewed with the patient to RTC or notify me.  Expected time course for resolution reviewed with patient.     Hypothyroid- subclinical, reviewed this w/ patient, will repeat to verify stability in labs  - TSH, 3RD GENERATION    Medication monitoring encounter  - CBC W/O DIFF  - METABOLIC PANEL, COMPREHENSIVE          Follow-up Disposition:  Return  in about 6 months (around 06/05/2013).   Advised him to call back or return to office if symptoms worsen/change/persist.  Discussed expected course/resolution/complications of diagnosis in detail with patient.    Medication risks/benefits/costs/interactions/alternatives discussed with patient.  He was given an after visit summary which includes diagnoses, current medications, & vitals.  He expressed understanding with the diagnosis and plan.

## 2012-12-04 NOTE — Patient Instructions (Signed)
Low Back Pain: Exercises  Your Care Instructions  Here are some examples of typical rehabilitation exercises for your condition. Start each exercise slowly. Ease off the exercise if you start to have pain.  Your doctor or physical therapist will tell you when you can start these exercises and which ones will work best for you.  How to do the exercises  Press-up    1. Lie on your stomach, supporting your body with your forearms.  2. Press your elbows down into the floor to raise your upper back. As you do this, relax your stomach muscles and allow your back to arch without using your back muscles. As your press up, do not let your hips or pelvis come off the floor.  3. Hold for 15 to 30 seconds, then relax.  4. Repeat 2 to 4 times.  Alternate arm and leg (bird dog) exercise    Note: Do this exercise slowly. Try to keep your body straight at all times, and do not let one hip drop lower than the other.  1. Start on the floor, on your hands and knees.  2. Tighten your belly muscles.  3. Raise one leg off the floor, and hold it straight out behind you. Be careful not to let your hip drop down, because that will twist your trunk.  4. Hold for about 6 seconds, then lower your leg and switch to the other leg.  5. Repeat 8 to 12 times on each leg.  6. Over time, work up to holding for 10 to 30 seconds each time.  7. If you feel stable and secure with your leg raised, try raising the opposite arm straight out in front of you at the same time.  Knee-to-chest exercise    1. Lie on your back with your knees bent and your feet flat on the floor.  2. Bring one knee to your chest, keeping the other foot flat on the floor (or keeping the other leg straight, whichever feels better on your lower back).  3. Keep your lower back pressed to the floor. Hold for at least 15 to 30 seconds.  4. Relax, and lower the knee to the starting position.  5. Repeat with the other leg. Repeat 2 to 4 times with each leg.  6. To get more stretch, put  your other leg flat on the floor while pulling your knee to your chest.  Curl-ups    1. Lie on the floor on your back with your knees bent at a 90-degree angle. Your feet should be flat on the floor, about 12 inches from your buttocks.  2. Cross your arms over your chest.  3. Slowly tighten your belly muscles and raise your shoulder blades off the floor.  4. Keep your head in line with your body, and do not press your chin to your chest.  5. Hold this position for 1 or 2 seconds, then slowly lower yourself back down to the floor.  6. Repeat 8 to 12 times.  Pelvic tilt exercise    1. Lie on your back with your knees bent.  2. "Brace" your stomach. This means to tighten your muscles by pulling in and imagining your belly button moving toward your spine. You should feel like your back is pressing to the floor and your hips and pelvis are rocking back.  3. Hold for about 6 seconds while you breathe smoothly.  4. Repeat 8 to 12 times.  Heel dig bridging    1.   Lie on your back with both knees bent and your ankles bent so that only your heels are digging into the floor. Your knees should be bent about 90 degrees.  2. Then push your heels into the floor, squeeze your buttocks, and lift your hips off the floor until your shoulders, hips, and knees are all in a straight line.  3. Hold for about 6 seconds as you continue to breathe normally, and then slowly lower your hips back down to the floor and rest for up to 10 seconds.  4. Do 8 to 12 repetitions.  Hamstring stretch in doorway    1. Lie on your back in a doorway, with one leg through the open door.  2. Slide your leg up the wall to straighten your knee. You should feel a gentle stretch down the back of your leg.  3. Hold the stretch for at least 15 to 30 seconds. Do not arch your back, point your toes, or bend either knee. Keep one heel touching the floor and the other heel touching the wall.  4. Repeat with your other leg.  5. Do 2 to 4 times for each leg.  Hip flexor  stretch    1. Kneel on the floor with one knee bent and one leg behind you. Place your forward knee over your foot. Keep your other knee touching the floor.  2. Slowly push your hips forward until you feel a stretch in the upper thigh of your rear leg.  3. Hold the stretch for at least 15 to 30 seconds. Repeat with your other leg.  4. Do 2 to 4 times on each side.  Wall sit    1. Stand with your back 10 to 12 inches away from a wall.  2. Lean into the wall until your back is flat against it.  3. Slowly slide down until your knees are slightly bent, pressing your lower back into the wall.  4. Hold for about 6 seconds, then slide back up the wall.  5. Repeat 8 to 12 times.  Follow-up care is a key part of your treatment and safety. Be sure to make and go to all appointments, and call your doctor if you are having problems. It's also a good idea to know your test results and keep a list of the medicines you take.   Where can you learn more?   Go to [employer]  Enter [phone] in the search box to learn more about "Low Back Pain: Exercises."   ?? 2006-2014 [employer]. Care instructions adapted under license by [employer] (which disclaims liability or warranty for this information). This care instruction is for use with your licensed healthcare professional. If you have questions about a medical condition or this instruction, always ask your healthcare professional. [place] any warranty or liability for your use of this information.  Content Version: 10.0.270728; Last Revised: June 29, 2012

## 2012-12-04 NOTE — Progress Notes (Signed)
Patient here for medication refills.

## 2012-12-26 MED ORDER — MODAFINIL 200 MG TAB
200 mg | ORAL_TABLET | Freq: Every day | ORAL | Status: DC
Start: 2012-12-26 — End: 2013-09-24

## 2012-12-26 NOTE — Telephone Encounter (Signed)
Pt aware insurance is not covering.  Will do self pay.  He reports previous script sent to [place].  Needs new script to go to local pharmacy.  Script provided. [place].

## 2012-12-27 NOTE — Telephone Encounter (Signed)
Patient notified Rx ready to be picked up at front desk.

## 2013-04-10 MED ORDER — PROPRANOLOL 20 MG TAB
20 mg | ORAL_TABLET | ORAL | Status: DC
Start: 2013-04-10 — End: 2013-05-21

## 2013-04-10 NOTE — Telephone Encounter (Signed)
Advised pt per Dr.Hayes' note regarding having labs done asap. Patient verbalized understanding.

## 2013-04-10 NOTE — Telephone Encounter (Signed)
Please call patient.  Never had labs done from 4/14.  Please get these done ASAP

## 2013-05-10 LAB — METABOLIC PANEL, COMPREHENSIVE
A-G Ratio: 2 (ref 1.1–2.5)
ALT (SGPT): 27 IU/L (ref 0–44)
AST (SGOT): 27 IU/L (ref 0–40)
Albumin: 4.7 g/dL (ref 3.5–5.5)
Alk. phosphatase: 133 IU/L — ABNORMAL HIGH (ref 39–117)
BUN/Creatinine ratio: 9 (ref 9–20)
BUN: 9 mg/dL (ref 6–24)
Bilirubin, total: 0.3 mg/dL (ref 0.0–1.2)
CO2: 20 mmol/L (ref 18–29)
Calcium: 8.8 mg/dL (ref 8.7–10.2)
Chloride: 91 mmol/L — ABNORMAL LOW (ref 97–108)
Creatinine: 0.99 mg/dL (ref 0.76–1.27)
GFR est AA: 104 mL/min/{1.73_m2} (ref >59)
GFR est non-AA: 90 mL/min/{1.73_m2} (ref >59)
GLOBULIN, TOTAL: 2.3 g/dL (ref 1.5–4.5)
Glucose: 85 mg/dL (ref 65–99)
Potassium: 4 mmol/L (ref 3.5–5.2)
Protein, total: 7 g/dL (ref 6.0–8.5)
Sodium: 129 mmol/L — ABNORMAL LOW (ref 134–144)

## 2013-05-10 LAB — CBC W/O DIFF
HCT: 41.2 % (ref 37.5–51.0)
HGB: 14.3 g/dL (ref 12.6–17.7)
MCH: 31.2 pg (ref 26.6–33.0)
MCHC: 34.7 g/dL (ref 31.5–35.7)
MCV: 90 fL (ref 79–97)
PLATELET: 208 10*3/uL (ref 150–379)
RBC: 4.59 x10E6/uL (ref 4.14–5.80)
RDW: 13.4 % (ref 12.3–15.4)
WBC: 3.8 10*3/uL (ref 3.4–10.8)

## 2013-05-10 LAB — TSH 3RD GENERATION: TSH: 7.89 u[IU]/mL — ABNORMAL HIGH (ref 0.450–4.500)

## 2013-05-10 NOTE — Progress Notes (Signed)
Quick Note:    Labs completed from 4/14 appt. Na low & alk phos is high. TSH is still high but < 10, no changes at this time  ______

## 2013-05-16 NOTE — Telephone Encounter (Signed)
Okay.  Please have them fax his note for my review prior to ordering the tests.  Also find out why he can't order these tests himself?

## 2013-05-16 NOTE — Telephone Encounter (Signed)
Dorian Pod from Dr.Ennever's office called requesting Dr.Hayes to order labs for pt. Reported pt has a hx of

## 2013-05-16 NOTE — Telephone Encounter (Signed)
Dorian Pod from Dr.Ennever's office called to report pt has a hx of testicular CA, and is concerned about swollen/painful lymph nodes in neck area. Dr.Ennever requested Dr.Hayes to order CBC w/diff, CMP,hepatic function,LDA, AFP time marker, and Beta HCG. Requested results to be faxed to them at [phone].

## 2013-05-16 NOTE — Telephone Encounter (Signed)
Message copied by Pecola Lawless on Thu May 16, 2013  3:15 PM  ------       Message from: Allegra Lai A       Created: Wed May 15, 2013  3:06 PM       Regarding: cph          labs         Dr Antonieta Pert office, cancer dr for this pt is calling and would like for you to call them [phone] ask for ellen, to discuss ordering some labs for this pt.  ------

## 2013-05-17 NOTE — Telephone Encounter (Signed)
Pt has not seen oncologist in > 1 yr.  Needs to come in for exam prior to ordering labs.  I need to justify checking labs therefore I need to examine him.

## 2013-05-17 NOTE — Telephone Encounter (Signed)
Unable to reach [facility] via phone, faxed request to her @ [phone], to receive last office note for Dr.Hayes to review prior to ordering labs.

## 2013-05-20 NOTE — Telephone Encounter (Signed)
Advised pt per Dr.Hayes' note regarding coming in to be examined prior to ordering lab work. Patient verbalized understanding, scheduled appt with Dr.Hayes for tomorrow morning 05/21/2013 @ 7:30am.

## 2013-05-21 NOTE — Progress Notes (Signed)
Faxed office note to [facility] @ [phone], Dr.Robert [facility] @ [phone], and Dr.Ennervers @ [phone], per Dr.Hayes' order.

## 2013-05-21 NOTE — Progress Notes (Signed)
Patient here to discuss lab results.

## 2013-05-21 NOTE — Progress Notes (Signed)
HISTORY OF PRESENT ILLNESS  John Friedman is a 47 y.o. male.  HPI  Since last did establish psychiatrist (Dr Delsa Bern) and neurologist (Dr Maretta Bees).  Notes reviewed.  They are taking over writing his medications.  [place]     Hematology & Oncology Review  Patient is here today to discuss soreness in the neck.  They are being followed by oncologist in [place].   Records received, last visit in '13, and he has f/u in 11/14.  He is reporting for the last 2-3 weeks soreness on both sides of the neck, it is on/off, it is worse w/ stretching/straining muscles in the neck, nothing really helps improve it.  No fevers, chills, sweats, weight loss.  He reports having some vague on/off, but rare discomfort in both arm pits & groins.  He ran this by his oncologist who recommend evaluation & labs.        Prior to Admission medications    Medication Sig Start Date End Date Taking? Authorizing Provider   clonazepam (KLONOPIN) 1 mg tablet Take 1 mg by mouth two (2) times a day. 1.5 mg in the morning; .5 mg in the evening   Yes Historical Provider   OTHER    Yes Historical Provider   modafinil (PROVIGIL) 200 mg tablet Take 1 Tab by mouth daily. 12/26/12  Yes Antony Haste, MD   clomiPRAMINE (ANAFRANIL) 50 mg capsule Take 3 Caps by mouth daily. 12/04/12  Yes Antony Haste, MD   pregabalin (LYRICA) 300 mg capsule Take 1 Cap by mouth daily. 12/04/12  Yes Antony Haste, MD          No Known Allergies          Review of Systems   Constitutional: Negative for fever, chills, weight loss and malaise/fatigue.   Respiratory: Negative for cough and shortness of breath.    Cardiovascular: Negative for chest pain and palpitations.   Gastrointestinal: Negative for nausea, vomiting, abdominal pain, diarrhea and constipation.   Neurological: Positive for tremors.   Psychiatric/Behavioral: Negative for depression. The patient has insomnia. The patient is not nervous/anxious.        Physical Exam   Cardiovascular: Regular  rhythm and normal heart sounds.    No murmur heard.  Pulmonary/Chest: Effort normal and breath sounds normal. He has no wheezes. He has no rales.   Abdominal: Soft. Bowel sounds are normal. He exhibits no mass. There is no hepatosplenomegaly. There is no tenderness.   Musculoskeletal:        Cervical back: He exhibits normal range of motion (minimal soreness w/ full ROM), no tenderness and no bony tenderness.   Lymphadenopathy:     He has no cervical adenopathy.     He has no axillary adenopathy.        Right: No inguinal adenopathy present.        Left: No inguinal adenopathy present.       BP 102/76   Pulse 102   Temp(Src) 97.7 ??F (36.5 ??C) (Oral)   Resp 20   Ht 5' 10.75" (1.797 m)   Wt 178 lb 9.6 oz (81.012 kg)   BMI 25.09 kg/m2   SpO2 99%      ASSESSMENT and PLAN  [name] was seen today for labs.  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  -  CBC WITH AUTOMATED DIFF  - METABOLIC PANEL, COMPREHENSIVE  - AFP, TUMOR MARKER  - HCG QL SERUM  - LD    Neck pain- new dx, unclear but favor muscular, reviewed in office stretching & use of heat.  We reviewed ergo dynamics at his desk    Essential tremor- stable, will defer to neurologist to write all meds from now on    Sleep disturbances- stable, will defer to psychiatrist to write all meds from now on        He refused flu shot today.  Will have him RTC in 1 yr (age 1) for FULL PHYSICAL        Follow-up Disposition:  Return in about 1 year (around 05/21/2014).   Advised him to call back or return to office if symptoms worsen/change/persist.  Discussed expected course/resolution/complications of diagnosis in detail with patient.    Medication risks/benefits/costs/interactions/alternatives discussed with patient.  He was given an after visit summary which includes diagnoses, current medications, & vitals.  He expressed  understanding with the diagnosis and plan.

## 2013-05-23 LAB — CBC WITH AUTOMATED DIFF
ABS. BASOPHILS: 0 10*3/uL (ref 0.0–0.2)
ABS. EOSINOPHILS: 0.1 10*3/uL (ref 0.0–0.4)
ABS. IMM. GRANS.: 0 10*3/uL (ref 0.0–0.1)
ABS. MONOCYTES: 0.5 10*3/uL (ref 0.1–0.9)
ABS. NEUTROPHILS: 2.2 10*3/uL (ref 1.4–7.0)
Abs Lymphocytes: 1 10*3/uL (ref 0.7–3.1)
BASOPHILS: 0 %
EOSINOPHILS: 2 %
HCT: 39.5 % (ref 37.5–51.0)
HGB: 13.7 g/dL (ref 12.6–17.7)
IMMATURE GRANULOCYTES: 0 %
Lymphocytes: 26 %
MCH: 30.5 pg (ref 26.6–33.0)
MCHC: 34.7 g/dL (ref 31.5–35.7)
MCV: 88 fL (ref 79–97)
MONOCYTES: 12 %
NEUTROPHILS: 60 %
PLATELET: 173 10*3/uL (ref 150–379)
RBC: 4.49 x10E6/uL (ref 4.14–5.80)
RDW: 13.3 % (ref 12.3–15.4)
WBC: 3.7 10*3/uL (ref 3.4–10.8)

## 2013-05-23 LAB — LD: LD: 190 IU/L (ref 0–225)

## 2013-05-23 LAB — METABOLIC PANEL, COMPREHENSIVE
A-G Ratio: 2.6 — ABNORMAL HIGH (ref 1.1–2.5)
ALT (SGPT): 25 IU/L (ref 0–44)
AST (SGOT): 25 IU/L (ref 0–40)
Albumin: 4.7 g/dL (ref 3.5–5.5)
Alk. phosphatase: 129 IU/L — ABNORMAL HIGH (ref 39–117)
BUN/Creatinine ratio: 12 (ref 9–20)
BUN: 13 mg/dL (ref 6–24)
Bilirubin, total: 0.3 mg/dL (ref 0.0–1.2)
CO2: 26 mmol/L (ref 18–29)
Calcium: 8.9 mg/dL (ref 8.7–10.2)
Chloride: 90 mmol/L — ABNORMAL LOW (ref 97–108)
Creatinine: 1.07 mg/dL (ref 0.76–1.27)
GFR est AA: 94 mL/min/{1.73_m2} (ref >59)
GFR est non-AA: 82 mL/min/{1.73_m2} (ref >59)
GLOBULIN, TOTAL: 1.8 g/dL (ref 1.5–4.5)
Glucose: 84 mg/dL (ref 65–99)
Potassium: 4.3 mmol/L (ref 3.5–5.2)
Protein, total: 6.5 g/dL (ref 6.0–8.5)
Sodium: 130 mmol/L — ABNORMAL LOW (ref 134–144)

## 2013-05-23 LAB — AFP, TUMOR MARKER
AFP, SERUM, TUMOR MARKER, 002266: 5.7 ng/mL (ref 0.0–8.3)
AFP, Serum, Tumor Marker: 5.7 ng/mL (ref 0.0–8.3)

## 2013-05-23 LAB — HCG QL SERUM: hCG,Beta Subunit,Ql.: NEGATIVE m[IU]/mL

## 2013-05-23 NOTE — Progress Notes (Signed)
Quick Note:    Please call patient. All labs are stable or at goal. Please fax to oncologist in [place].  ______

## 2013-05-24 NOTE — Progress Notes (Signed)
Quick Note:    Advised pt per Dr.Hayes' note regarding recent blood work. Patient verbalized understanding, faxed results to Dorian Pod @ [phone] per Dr.Hayes' order.  ______

## 2013-06-26 LAB — METABOLIC PANEL, COMPREHENSIVE
A-G Ratio: 1.4 (ref 1.1–2.2)
ALT (SGPT): 31 U/L (ref 12–78)
AST (SGOT): 22 U/L (ref 15–37)
Albumin: 4.3 g/dL (ref 3.5–5.0)
Alk. phosphatase: 137 U/L — ABNORMAL HIGH (ref 45–117)
Anion gap: 10 mmol/L (ref 5–15)
BUN/Creatinine ratio: 14 (ref 12–20)
BUN: 11 MG/DL (ref 6–20)
Bilirubin, total: 0.3 MG/DL (ref 0.2–1.0)
CO2: 25 mmol/L (ref 21–32)
Calcium: 9.3 MG/DL (ref 8.5–10.1)
Chloride: 94 mmol/L — ABNORMAL LOW (ref 97–108)
Creatinine: 0.8 MG/DL (ref 0.45–1.15)
GFR est AA: >60 mL/min/{1.73_m2} (ref >60)
GFR est non-AA: >60 mL/min/{1.73_m2} (ref >60)
Globulin: 3.1 g/dL (ref 2.0–4.0)
Glucose: 82 mg/dL (ref 65–100)
Potassium: 3.3 mmol/L — ABNORMAL LOW (ref 3.5–5.1)
Protein, total: 7.4 g/dL (ref 6.4–8.2)
Sodium: 129 mmol/L — ABNORMAL LOW (ref 136–145)

## 2013-06-26 LAB — EKG, 12 LEAD, INITIAL
Atrial Rate: 86 {beats}/min
Calculated P Axis: 50 degrees
Calculated R Axis: 14 degrees
Calculated T Axis: 79 degrees
Diagnosis: NORMAL
P-R Interval: 164 ms
Q-T Interval: 378 ms
QRS Duration: 88 ms
QTC Calculation (Bezet): 452 ms
Ventricular Rate: 86 {beats}/min

## 2013-06-26 LAB — CBC WITH AUTOMATED DIFF
ABS. BASOPHILS: 0 10*3/uL (ref 0.0–0.1)
ABS. EOSINOPHILS: 0.1 10*3/uL (ref 0.0–0.4)
ABS. LYMPHOCYTES: 1.2 10*3/uL (ref 0.8–3.5)
ABS. MONOCYTES: 0.5 10*3/uL (ref 0.0–1.0)
ABS. NEUTROPHILS: 1.7 10*3/uL — ABNORMAL LOW (ref 1.8–8.0)
BASOPHILS: 0 % (ref 0–1)
EOSINOPHILS: 2 % (ref 0–7)
HCT: 38.8 % (ref 36.6–50.3)
HGB: 13.5 g/dL (ref 12.1–17.0)
LYMPHOCYTES: 34 % (ref 12–49)
MCH: 30.7 PG (ref 26.0–34.0)
MCHC: 34.8 g/dL (ref 30.0–36.5)
MCV: 88.2 FL (ref 80.0–99.0)
MONOCYTES: 15 % — ABNORMAL HIGH (ref 5–13)
NEUTROPHILS: 49 % (ref 32–75)
PLATELET: 161 10*3/uL (ref 150–400)
RBC: 4.4 M/uL (ref 4.10–5.70)
RDW: 12.3 % (ref 11.5–14.5)
WBC: 3.5 10*3/uL — ABNORMAL LOW (ref 4.1–11.1)

## 2013-06-26 LAB — PROTHROMBIN TIME + INR
INR: 1 (ref 0.9–1.1)
Prothrombin time: 10.5 s (ref 9.4–11.7)

## 2013-06-26 LAB — TROPONIN I: Troponin-I, Qt.: <0.04 ng/mL (ref <0.05)

## 2013-06-26 LAB — CK W/ CKMB & INDEX
CK - MB: 2.3 NG/ML (ref 0.5–3.6)
CK-MB Index: 0.9 (ref 0–2.5)
CK: 251 U/L (ref 39–308)

## 2013-06-26 LAB — GLUCOSE, POC: Glucose (POC): 87 mg/dL (ref 75–110)

## 2013-06-26 MED ORDER — SODIUM CHLORIDE 0.9 % IJ SYRG
Freq: Once | INTRAMUSCULAR | Status: AC
Start: 2013-06-26 — End: 2013-06-26
  Administered 2013-06-26: 20:00:00 via INTRAVENOUS

## 2013-06-26 MED ORDER — LYRICA 300 MG CAPSULE
300 mg | ORAL_CAPSULE | ORAL | Status: DC
Start: 2013-06-26 — End: 2019-01-21

## 2013-06-26 MED ORDER — SODIUM CHLORIDE 0.9 % IV
INTRAVENOUS | Status: DC
Start: 2013-06-26 — End: 2013-06-26
  Administered 2013-06-26: 18:00:00 via INTRAVENOUS

## 2013-06-26 MED ORDER — SODIUM CHLORIDE 0.9 % IJ SYRG
INTRAMUSCULAR | Status: DC | PRN
Start: 2013-06-26 — End: 2013-06-26

## 2013-06-26 MED ORDER — POTASSIUM CHLORIDE SR 10 MEQ TAB
10 mEq | ORAL | Status: AC
Start: 2013-06-26 — End: 2013-06-26
  Administered 2013-06-26: 12:00:00 via ORAL

## 2013-06-26 MED ORDER — ASPIRIN 325 MG TAB
325 mg | ORAL | Status: AC
Start: 2013-06-26 — End: 2013-06-26
  Administered 2013-06-26: 09:00:00 via ORAL

## 2013-06-26 MED ORDER — GADOBUTROL 10 MMOL/10 ML (1 MMOL/ML) IV
10 mmol/ mL (1 mmol/mL) | Freq: Once | INTRAVENOUS | Status: AC
Start: 2013-06-26 — End: 2013-06-26
  Administered 2013-06-26: 20:00:00 via INTRAVENOUS

## 2013-06-26 MED ORDER — SODIUM CHLORIDE 0.9 % IJ SYRG
Freq: Three times a day (TID) | INTRAMUSCULAR | Status: DC
Start: 2013-06-26 — End: 2013-06-26
  Administered 2013-06-26 (×2): via INTRAVENOUS

## 2013-06-26 NOTE — Procedures (Signed)
[facility]  *** FINAL REPORT ***    Name: John, Friedman  MRN: [id number]  DOB: 1966/05/18  HIS Order #: [id number]  [place] Visit #: [id number]  Date: 26 Jun 2013    TYPE OF TEST: Cerebrovascular Duplex    REASON FOR TEST  Cerebrovascular accident    Right Carotid:-             Proximal               Mid                 Distal  cm/s  Systolic  Diastolic  Systolic  Diastolic  Systolic  Diastolic  CCA:     99991111      22.0                            69.0      23.0  Bulb:  ECA:     81.0      14.0  ICA:     66.0      23.0                            68.0      29.0  ICA/CCA:  1.0       1.0    ICA Stenosis:    Right Vertebral:-  Finding: Antegrade  Sys:       42.0  Dia:    Right Subclavian:    Left Carotid:-            Proximal                Mid                 Distal  cm/s  Systolic  Diastolic  Systolic  Diastolic  Systolic  Diastolic  CCA:     [id number]      24.0                            87.0      29.0  Bulb:  ECA:     84.0      14.0  ICA:     82.0      26.0                            84.0      35.0  ICA/CCA:  0.9       0.9    ICA Stenosis:    Left Vertebral:-  Finding: Antegrade  Sys:       48.0  Dia:    Left Subclavian:    INTERPRETATION/FINDINGS  PROCEDURE:  Color duplex ultrasound imaging of extracranial  cerebrovascular arteries.    FINDINGS:       Right:  Internal carotid velocity is within normal limits, there  is no observed narrowing of the flow channel on color Doppler imaging,   and no plaque is visualized on B-mode imaging.  The common and  external carotid arteries are patent and without evidence of  hemodynamically significant stenosis.       Left:  Internal carotid velocity is within normal limits, there  appears to be minimal narrowing of the flow channel on color Doppler  imaging, and mixed density plaque is visualized on B-mode imaging,  consistent with  less than 50 percent stenosis.  The common and  external carotid arteries are patent and without evidence of  hemodynamically significant  stenosis.    IMPRESSION:  Consistent with no stenosis of the right internal carotid   and minimal to no stenosis of the left internal carotid.  Vertebrals  are patent with antegrade flow.    ADDITIONAL COMMENTS    I have personally reviewed the data relevant to the interpretation of  this  study.    TECHNOLOGIST: Lilli Light, RVT  Signed: 06/26/2013 09:23 AM    PHYSICIAN: Vernard Gambles., MD  Signed: 06/27/2013 07:58 AM

## 2013-06-26 NOTE — Procedures (Signed)
[facility]  *** FINAL REPORT ***    Name: John Friedman, John Friedman  MRN: [id number]    Inpatient  DOB: March 02, 1966  HIS Order #: [id number]  [place] Visit #: [id number]  Date: 26 Jun 2013    TYPE OF TEST: Peripheral Venous Testing    REASON FOR TEST  Pain in limb    Left Leg:-  Deep venous thrombosis:           No  Superficial venous thrombosis:    No  Deep venous insufficiency:        Not examined  Superficial venous insufficiency: Not examined      INTERPRETATION/FINDINGS  PROCEDURE:  Color duplex ultrasound imaging of lower extremity veins.    FINDINGS:       Right: The common femoral vein is patent on color Doppler imaging   and phasic flow is observed.  This extremity was not otherwise  evaluated.       Left:   The common femoral, deep femoral, femoral, popliteal,  posterior tibial, peroneal, and great saphenous are visualized; each  is compressible and no narrowing of the flow channel on color Doppler  imaging is observed.  Phasic flow is observed in the common femoral  vein.    IMPRESSION:  No evidence of left lower extremity vein thrombosis.    ADDITIONAL COMMENTS    I have personally reviewed the data relevant to the interpretation of  this  study.    TECHNOLOGIST: Lilli Light, RVT  Signed: 06/26/2013 09:16 AM    PHYSICIAN: Vernard Gambles., MD  Signed: 06/27/2013 07:57 AM

## 2013-06-26 NOTE — Progress Notes (Signed)
Bedside shift change report given to Allie (Soil scientist) by Thayer Headings (offgoing nurse). Report included the following information SBAR, Kardex, Intake/Output, MAR, Recent Results and Med Rec Status.

## 2013-06-26 NOTE — Discharge Summary (Signed)
Agree with NP's discharge plan    Patient seen and examine separately    Patterson Hammersmith, MD

## 2013-06-26 NOTE — H&P (Signed)
Name:       John Friedman, John Friedman          Admitted:    06/26/2013    Account #:  [id number]                     DOB:         [birth year]  Physician:  Marigene Ehlers, MD              Age:         47                               HISTORY AND PHYSICAL      CHIEF COMPLAINT: Weakness and numbness.    HISTORY OF PRESENT ILLNESS: A 47 year old white male with past medical  history of chronic fatigue syndrome, sleep disturbance, hypothyroidism,  essential tremor, GERD, left testicular cancer, presented to the emergency  department via EMS complaining of left-sided weakness and numbness. He  noted onset of symptoms approximately 1:30 a.m. this morning. He notes that  he had sudden onset of weakness involving his left upper and lower  extremity as well as weakness. He complains of a burning "in my stomach, my  head, left arm and left leg". Symptoms notably remain constant without  specific known aggravating or alleviating factors, moderate severity.  Symptoms reportedly without specific exacerbating or alleviating factors.  He notably has a history of having an essential tremor, and also he reports  he has chronic fatigue syndrome. He does not report prior history of stroke  or other positive stated neurological disorders not mentioned. He does not  complain of any headache, visual disturbance, slurred speech, facial droop,  syncope, loss of consciousness, new-onset neck pain, chest pain, shortness  of breath, palpitations, cough, congestion, fever, chills, back pain,  abdominal pain, nausea, vomiting, diarrhea, melena, dysuria, hematuria,  bowel or bladder incontinence, calf swelling. However, he complains of  having left calf pain which has been ongoing for the past month. He  complains of having some lightheadedness. On arrival in the emergency  department, initial recorded vital signs: Blood pressure 135/88, heart rate  88, respiratory rate 18, saturation 99% on room air. He was afebrile. CT  columnar of the head  without contrast was negative for acute intracranial  abnormalities. 12-lead EKG showed normal sinus rhythm, nonspecific ST and  T-wave changes at 86 beats per minute. The patient was seen by Neurology  Telemedicine stroke specialist, who felt the patient needs admission for  workup. TPA was offered; patient declined. The following was as per the  MD's notes. On current evaluation, patient still complains of some residual  weakness and numbness of his left side. He was reportedly, given aspirin  325 mg p.o. x1 dose.    PAST MEDICAL HISTORY  1. Chronic fatigue syndrome.  2. Essential tremor.  3. GERD.  4. 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  5. Anxiety.    PAST SURGICAL HISTORY  1. Status post orchiectomy.  2. Pectus excavatum surgery at age 20.  3. EGD, reportedly normal.  4. Colonoscopy, findings hyperplastic polyp, 07/04/2010.  5. Laparoscopic cholecystectomy, 12/05/2011.  6. Left tonsil biopsy, reportedly benign, 06/2012.    MEDICATIONS AS NOTED IN MEDICAL RECORD, CONFIRMED WITH PATIENT  1. Anafranil 50 mg 3 capsules p.o. daily.  2. Temazepam 1.5 mg p.o. q.a.m. and 0.5 mg p.o.  q.p.m.  3. Provigil 200 mg 1 tablet p.o. daily.  4. Lyrica 300 mg 1 capsule p.o. daily.  5. Propranolol 20 mg p.o. daily.    ALLERGIES: NO KNOWN DRUG ALLERGIES.    SOCIAL HISTORY: Negative for smoking or illicit drugs. Positive rare  alcohol.    FAMILY HISTORY: He is adopted, but he knows his family's history.  Parkinsonism - his father, prostate cancer - his paternal and maternal  grandfathers. No reported family members with heart attacks or strokes.    REVIEW OF SYSTEMS  Twelve systems reviewed, pertinent positives HPI, otherwise negative.    PHYSICAL EXAMINATION  VITAL SIGNS: Temperature is 98.2 degrees Fahrenheit, blood pressure ,  heart rate 99, respiratory rate 17, O2 saturation 99% on room air, weight  is 180 pounds.  GENERAL: Patient in no acute respiratory distress.   PSYCHIATRIC: The patient is awake, alert x3; very anxious.  NEUROLOGIC: GCS of 15. Moves extremities x4 with essential tremor. No  slurred speech, no facial droop. No pronator drift. Sensation is grossly  decreased; mildly decreased in the left upper and lower extremities,  compared to the right.  HEENT: Normocephalic, atraumatic. PERRLA. EOMs intact. Sclerae anicteric,  conjunctivae clear. Nares are patent. Oropharynx is clear.  NECK: Supple without lymphadenopathy, no JVD or carotid bruits, no  thyromegaly.  LYMPH: Negative for cervical, supraclavicular adenopathy.  RESPIRATORY: Lungs are clear to auscultation bilaterally.  CARDIOVASCULAR: Heart is regular rate and rhythm, normal S1, S2, without  murmurs, rubs or gallops.  GI: Abdomen soft, nontender, nondistended. Normoactive bowel sounds. No  rebound, guarding or rigidity. No auscultated abdominal bruits. No  pulsatile mass.  BACK: No CVS tenderness. No step-off deformity.  MUSCULOSKELETAL: Full range of motion. He has tenderness to palpation of  the left calf, no tenderness to palpation of the right calf, full range of  motion. No acute palpable bony deformity.  VASCULAR: 2+ radial, DP pulses without cyanosis, clubbing, edema.  SKIN: Warm and dry.    LABORATORY DATA: Sodium 129, potassium 3.3, chloride 94, CO2 25, BUN of 11,  creatinine 0.80, glucose of 82, anion gap 10, calcium 9.3, GFR greater than  60. Total bilirubin 0.3, total protein 7.4, albumin is 4.3, ALT is 31, AST  22, alkaline phosphatase 137. CK total 251. CK-MB of 2.3, and troponin I  less than 0.04. WBC of 3.5, hemoglobin 13.5, hematocrit 30.8, and platelets  are 161, neutrophils are 49%. INR 1.0, PT of 10.5.  CT columnar of the head without contrast: No acute findings.  A 12-lead EKG: Results reviewed, normal sinus rhythm, nonspecific ST and  T-wave abnormalities, 86 beats a minute.    IMPRESSION AND PLAN: A 47 year old male with noted past medical history,  now admitted with left-sided  weakness and numbness - acute - rule out  transient ischemic attack versus cerebrovascular accident.    1. Acute left-sided weakness - rule out stroke/transient ischemic attack.  Admit patient to telemetry. Placed on neurovascular checks, fall  precautions. Consult with neurologist. Order MRI of the brain, carotid  Doppler, 2D echocardiogram. Continue aspirin therapy.  2. Acute left-sided numbness. Plan as noted above. Consult with physical  and occupational therapists. Order dysphagia screen.  3. Hyponatremia. Placed on 0.9% normal saline. Repeat sodium level in the  a.m. Monitor closely. Check urine sodium osmolality.  4. Hypokalemia. Replace potassium, repeat potassium levels  post-replacement.  5. Essential tremor - chronic.  6. Mild leukocytosis. Repeat CBC in a.m.  7. Anxiety. Resume patient back on his home medications.  8. Chronic fatigue syndrome - at baseline.  9. Left calf pain. Will order a duplex of the left lower extremity to rule  out any possible deep vein thrombosis, given history.  10. Venous thromboembolism prophylaxis. Will hold on SCDs pending results  of the venous duplex. If negative, then proceed with SCDs, bilateral lower  extremities.        Reviewed on 06/26/2013 7:36 AM                Marigene Ehlers, MD    cc:                       Marigene Ehlers, MD      MLP/wmx; D: 06/26/2013 05:29 A; T: 06/26/2013 07:16 A; DOC# [id number]; Job#  [id number]

## 2013-06-26 NOTE — Consults (Signed)
NEUROLOGY CONSULT NOTE    Patient ID:  John Friedman  [id number]  47 y.o.  [birth year]    Date of Consultation:  June 26, 2013    Referring Physician: Trilby Drummer L. Pegram, MD    Reason for Consultation:  Left sided weakness and numbness    History of Present Illness:     Patient Active Problem List    Diagnosis Date Noted   ??? Weakness of left upper extremity 06/26/2013   ??? Lumbar radiculopathy 08/10/2012   ??? Subclinical hypothyroidism    ??? Essential tremor    ??? GERD (gastroesophageal reflux disease)    ??? Testicular cancer    ??? Sleep disturbances    ??? Chronic fatigue 08/15/1985     Past Medical History   Diagnosis Date   ??? Sleep disturbances      acting out in his sleep   ??? Chronic fatigue January 1987   ??? Hypothyroid    ??? Essential tremor    ??? GERD (gastroesophageal reflux disease)    ??? Cancer      left testicle- stage III, nonseminomatous germ cell tumor, s/p orchiectomy & BEP x 3 cycles   ??? Normal cardiac stress test 09/05/11      Past Surgical History   Procedure Laterality Date   ??? Hx orchiectomy Left 2002   ??? Hx orthopaedic       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25   ??? Hx gi  09/09/11     EGD- normal   ??? Hx colonoscopy  07/12/10     hyperplastic polyp, repeat 10 yrs   ??? Hx cholecystectomy  12/05/11   ??? Hx heent Left 06/2012     tonsilar bx: benign tissue (Dr Buddy Duty)      Prior to Admission medications    Medication Sig Start Date End Date Taking? Authorizing Provider   clonazepam (KLONOPIN) 1 mg tablet Take 1 mg by mouth two (2) times a day. 1.5 mg in the morning; .5 mg in the evening    Historical Provider   OTHER     Historical Provider   modafinil (PROVIGIL) 200 mg tablet Take 1 Tab by mouth daily. 12/26/12   Antony Haste, MD   clomiPRAMINE (ANAFRANIL) 50 mg capsule Take 3 Caps by mouth daily. 12/04/12   Antony Haste, MD   pregabalin (LYRICA) 300 mg capsule Take 1 Cap by mouth daily. 12/04/12   Antony Haste, MD     No Known Allergies   History   Substance Use Topics   ??? Smoking status:  Never Smoker    ??? Smokeless tobacco: Never Used   ??? Alcohol Use: Yes      Comment: rarely      Family History   Problem Relation Age of Onset   ??? Parkinsonism Father    ??? Cancer Maternal Grandfather      prostate   ??? Cancer Paternal Grandfather      prostate        Subjective:      John Friedman is a 47 y.o. RHWM with history of chronic fatigue syndrome and tremors who was admitted from the ER early this morning for left sided paresthesias. Per patient he has had longstanding issues with fatigue from a viral illness years ago and has had tremors which have worsened the past several months. He saw Dr. Luetta Nutting (neurologist) about 6 weeks PTA and his Clonazepam was increased to see if it helped with his tremors. He was  then set up to see Dr. Uvaldo Rising (movement specialist) in January 2015. Per patient he has been having left hamstring and calf pain for several weeks. Then woke up early this morning with numbness and pain of the left arm. Not the whole arm but just portions of it. He did not really mention any weakness to me. But ER and hospitalist mention left sided weakness. In the ER, BP was 135/88. Labs revealed low WBC, hyponatremia (129) and hypokalemia (3.3). Head CT was negative. Observe to the ER to be walking around and was steady. Patient seen by tele-neurology and noted left sided sensory deficit. NIHSS was 1. Recommended TPA which the patient refused. Currently the sensory changes and symptoms on the left arm and leg persists.    Outside reports reviewed: ER records, radiology reports, historical medical records.    Review of Systems:    Pertinent items are noted in HPI.    Objective:     Patient Vitals for the past 8 hrs:   BP Temp Pulse Resp SpO2   06/26/13 1100 130/79 mmHg 98.4 ??F (36.9 ??C) 98 15 100 %   06/26/13 0700 112/71 mmHg 98.4 ??F (36.9 ??C) 93 19 98 %   06/26/13 0530 122/91 mmHg 98 ??F (36.7 ??C) 90 18 100 %   06/26/13 0430 100/57 mmHg 98.1 ??F (36.7 ??C) 92 18 98 %   06/26/13 0400 111/70 mmHg  - 90 20 100 %           NEUROLOGICAL EXAM:    Appearance:  The patient is well developed, well nourished, provides a coherent history and is in no acute distress.   Mental Status: Oriented to time, place and person. Fluent, no aphasia or dysarthria. Follows commands. Seems anxious and talks a lot.    Cranial Nerves:   Intact visual fields. Pupils 3 mm RTL, EOM's full, no nystagmus, no ptosis. Facial sensation is normal. Corneal reflexes are intact. Facial movement is symmetric. Hearing is normal bilaterally. Palate is midline with normal sternocleidomastoid and trapezius muscles are normal. Tongue is midline.   Motor:  5/5 strength in upper and lower proximal and distal muscles. Normal bulk and tone. No pronator drift.   Reflexes:   Deep tendon reflexes were symmetric. Toes downgoing.   Sensory:   Claims decrease cold, PP and vibration left upper and lower extremity.   Gait:  Normal gait.   Tremor:   Mainly postural and worse intentional tremors UE. No resting tremors.    Cerebellar:  No cerebellar signs present.     Imaging  CT Head: obtained and reviewed    Lab Review    Recent Results (from the past 24 hour(s))   CBC WITH AUTOMATED DIFF    Collection Time     06/26/13  2:45 AM       Result Value Range    WBC 3.5 (*) 4.1 - 11.1 K/uL    RBC 4.40  4.10 - 5.70 M/uL    HGB 13.5  12.1 - 17.0 g/dL    HCT 38.8  36.6 - 50.3 %    MCV 88.2  80.0 - 99.0 FL    MCH 30.7  26.0 - 34.0 PG    MCHC 34.8  30.0 - 36.5 g/dL    RDW 12.3  11.5 - 14.5 %    PLATELET 161  150 - 400 K/uL    NEUTROPHILS 49  32 - 75 %    LYMPHOCYTES 34  12 - 49 %    MONOCYTES  15 (*) 5 - 13 %    EOSINOPHILS 2  0 - 7 %    BASOPHILS 0  0 - 1 %    ABS. NEUTROPHILS 1.7 (*) 1.8 - 8.0 K/UL    ABS. LYMPHOCYTES 1.2  0.8 - 3.5 K/UL    ABS. MONOCYTES 0.5  0.0 - 1.0 K/UL    ABS. EOSINOPHILS 0.1  0.0 - 0.4 K/UL    ABS. BASOPHILS 0.0  0.0 - 0.1 K/UL   METABOLIC PANEL, COMPREHENSIVE    Collection Time     06/26/13  2:45 AM       Result Value Range    Sodium 129 (*) 136 -  145 mmol/L    Potassium 3.3 (*) 3.5 - 5.1 mmol/L    Chloride 94 (*) 97 - 108 mmol/L    CO2 25  21 - 32 mmol/L    Anion gap 10  5 - 15 mmol/L    Glucose 82  65 - 100 mg/dL    BUN 11  6 - 20 MG/DL    Creatinine 0.80  0.45 - 1.15 MG/DL    BUN/Creatinine ratio 14  12 - 20      GFR est AA >60  >60 ml/min/1.13m2    GFR est non-AA >60  >60 ml/min/1.57m2    Calcium 9.3  8.5 - 10.1 MG/DL    Bilirubin, total 0.3  0.2 - 1.0 MG/DL    ALT 31  12 - 78 U/L    AST 22  15 - 37 U/L    Alk. phosphatase 137 (*) 45 - 117 U/L    Protein, total 7.4  6.4 - 8.2 g/dL    Albumin 4.3  3.5 - 5.0 g/dL    Globulin 3.1  2.0 - 4.0 g/dL    A-G Ratio 1.4  1.1 - 2.2     CK W/ CKMB & INDEX    Collection Time     06/26/13  2:45 AM       Result Value Range    CK 251  39 - 308 U/L    CK - MB 2.3  0.5 - 3.6 NG/ML    CK-MB Index 0.9  0 - 2.5     TROPONIN I    Collection Time     06/26/13  2:45 AM       Result Value Range    Troponin-I, Qt. <0.04  <0.05 ng/mL   PROTHROMBIN TIME + INR    Collection Time     06/26/13  2:45 AM       Result Value Range    INR 1.0  0.9 - 1.1      Prothrombin time 10.5  9.4 - 11.7 sec   EKG, 12 LEAD, INITIAL    Collection Time     06/26/13  2:51 AM       Result Value Range    Ventricular Rate 86      Atrial Rate 86      P-R Interval 164      QRS Duration 88      Q-T Interval 378      QTC Calculation (Bezet) 452      Calculated P Axis 50      Calculated R Axis 14      Calculated T Axis 79      Diagnosis        Value: Normal sinus rhythm      Nonspecific ST and T wave abnormality  No previous ECGs available      Confirmed by Sheryle Hail, MD, Zubair [id number]) on 06/26/2013 10:32:24 AM   GLUCOSE, POC    Collection Time     06/26/13  2:54 AM       Result Value Range    Glucose (POC) 87  75 - 110 mg/dL    Performed by Francee Piccolo           Assessment:     Principal Problem:    Weakness of left upper extremity (06/26/2013)        Plan:   1. Neurological examination reveals mainly intentional>postural UE tremors and left hemisensory  deficit not loss. Possible right thalamic stroke vs peripheral nerve related. Also likely with essential tremors.  2. Agree with obtaining a brain MRI with DWI to assess for a stroke.  3. Carotid doppler was unremarkable. No DVT of LE doppler.  4. Awaiting echocardiogram.  5. Lipid panel pending.  6. Continue Aspirin for stroke prophylaxis.  7. Baseline evaluations by PT and OT.  Thank you for the consult.

## 2013-06-26 NOTE — Progress Notes (Signed)
Occupational Therapy 1505:    Attempted to see patient for OT evaluation however pt currently off the floor.  Spoke with PT and NSG who reported pt is IND with ambulation and ADLs however neurologist note states pt demonstrates intention tremors and sensory deficits.  Per report from NSG/PT pt is safe to discharge home however if pt in hospital tomorrow will follow up with screening to determine need for full OT evaluation.      Junious Silk OTR/L

## 2013-06-26 NOTE — Progress Notes (Signed)
Chart reviewed for medical necessity and discharge John Friedman

## 2013-06-26 NOTE — Progress Notes (Addendum)
Stroke Education provided to patient and the following topics were discussed    1. Patients personal risk factors for stroke are none    2. Warning signs of Stroke:        * Sudden numbness or weakness of the face, arm or leg, especially on one side of          The body            * Sudden confusion, trouble speaking or understanding        * Sudden trouble seeing in one or both eyes        * Sudden trouble walking, dizziness, loss of balance or coordination        * Sudden severe headache with no known cause      3. Importance of activation Emergency Medical Services ( 9-1-1 ) immediately if experience any warning signs of stroke.    4. Be sure and schedule a follow-up appointment with your primary care doctor or any specialists as instructed.     5. You must take medicine every day to treat your risk factors for stroke.  Be sure to take your medicines exactly as your doctor tells you: no more, no less.  Know what your medicines are for , what they do.  Anti-thrombotics /anticoagulants can help prevent strokes.  You are taking the following medicine(s)  None     6.  Smoking and second-hand smoke greatly increase your risk of stroke, cardiovascular disease and death. Smoking history never    7. Information provided was Stroke Handouts    8. Documentation of teaching completed in Patient Education Activity and on Care Plan with teaching response noted?  yes    I have reviewed discharge instructions and follow-up appointments with the patient.  The patient verbalized understanding. Pt stable, no s/s of distress. VSS. IV and teld d/c'd.

## 2013-06-26 NOTE — Telephone Encounter (Signed)
Mailed rx to pt per Dr.Hayes' order.

## 2013-06-26 NOTE — Progress Notes (Signed)
TTE Completed Results to follow

## 2013-06-26 NOTE — ED Notes (Signed)
Bedside and Verbal shift change report given to Robbie Lis (oncoming nurse) by Lucina Mellow (offgoing nurse). Report included the following information SBAR, ED Summary, Procedure Summary and MAR.

## 2013-06-26 NOTE — ED Provider Notes (Signed)
Patient is a 47 y.o. male presenting with weakness.   Extremity Weakness   The problem has not changed since onset.Pertinent negatives include no back pain.    47 year old male with essential tremor, testicular cancer in remission, chronic fatigue, gerd, hypothyroid who presents with 1 1/2 hours of left sided numbness and weakness. Denies headache or change in speech/vision.  He has had some intermittent muscle pain on the left side for about 1 month.  He has seen an neurologist for the tremors which are getting worse.  He reports he had a viral illness 20+ years ago and developed chronic fatigue from that.  Never had numbness like this before.     Past Medical History   Diagnosis Date   ??? Sleep disturbances      acting out in his sleep   ??? Chronic fatigue January 1987   ??? Hypothyroid    ??? Essential tremor    ??? GERD (gastroesophageal reflux disease)    ??? Cancer      left testicle- stage III, nonseminomatous germ cell tumor, s/p orchiectomy & BEP x 3 cycles   ??? Normal cardiac stress test 09/05/11        Past Surgical History   Procedure Laterality Date   ??? Hx orchiectomy Left 2002   ??? Hx orthopaedic       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54   ??? Hx gi  09/09/11     EGD- normal   ??? Hx colonoscopy  07/12/10     hyperplastic polyp, repeat 10 yrs   ??? Hx cholecystectomy  12/05/11   ??? Hx heent Left 06/2012     tonsilar bx: benign tissue (Dr Buddy Duty)         Family History   Problem Relation Age of Onset   ??? Parkinsonism Father    ??? Cancer Maternal Grandfather      prostate   ??? Cancer Paternal Grandfather      prostate        History     Social History   ??? Marital Status: SINGLE     Spouse Name: N/A     Number of Children: N/A   ??? Years of Education: N/A     Occupational History   ??? Not on file.     Social History Main Topics   ??? Smoking status: Never Smoker    ??? Smokeless tobacco: Never Used   ??? Alcohol Use: Yes      Comment: rarely   ??? Drug Use: No   ??? Sexually Active: No     Other Topics Concern   ??? Not on file     Social History  Narrative   ??? No narrative on file                  ALLERGIES: Review of patient's allergies indicates no known allergies.      Review of Systems   Constitutional: Negative for fever, activity change and appetite change.   HENT: Negative for congestion and rhinorrhea.    Eyes: Negative for visual disturbance.   Respiratory: Negative for cough and shortness of breath.    Cardiovascular: Negative for chest pain.   Gastrointestinal: Negative for nausea, vomiting and abdominal pain.   Endocrine: Negative for polyuria.   Genitourinary: Negative for dysuria.   Musculoskeletal: Negative for back pain.   Skin: Negative for rash.   Neurological: Positive for weakness. Negative for headaches.   Psychiatric/Behavioral: Negative for dysphoric mood.  There were no vitals filed for this visit.         Physical Exam   Constitutional: He is oriented to person, place, and time. He appears well-developed and well-nourished. No distress.   Appears anxious, talking rapidly and loudly   HENT:   Head: Normocephalic and atraumatic.   Mouth/Throat: Oropharynx is clear and moist. No oropharyngeal exudate.   Eyes: Conjunctivae and EOM are normal. Pupils are equal, round, and reactive to light. Right eye exhibits no discharge. Left eye exhibits no discharge. No scleral icterus.   Neck: Normal range of motion. Neck supple. No JVD present.   Cardiovascular: Normal rate, regular rhythm, normal heart sounds and intact distal pulses.  Exam reveals no gallop and no friction rub.    No murmur heard.  Pulmonary/Chest: Effort normal and breath sounds normal. No stridor. No respiratory distress. He has no wheezes. He has no rales. He exhibits no tenderness.   Abdominal: Soft. Bowel sounds are normal. He exhibits no distension and no mass. There is no tenderness. There is no rebound and no guarding.   Musculoskeletal: Normal range of motion. He exhibits no edema and no tenderness.   Neurological: He is alert and oriented to person, place, and time.  He has normal reflexes. No cranial nerve deficit. He exhibits normal muscle tone. Coordination normal.   Resting tremor bilaterally- worse on right     Skin: Skin is warm and dry. No rash noted. No erythema.   Psychiatric: He has a normal mood and affect. His behavior is normal. Judgment and thought content normal.        MDM    Procedures    ED EKG interpretation:  Rhythm: normal sinus rhythm; and regular . Rate (approx.): 86; Axis: normal; P wave: normal; QRS interval: normal ; ST/T wave: non-specific changes;This EKG was interpreted by Rexene Alberts, MD,ED Provider.    Patient seen by [facility] and felt patient needs admission for work up. Offered TPA and patient declined.

## 2013-06-26 NOTE — Progress Notes (Signed)
Chart reviewed and case discussed with nsg. Nsg reports no swallowing difficulties or speech concerns. No formal SLP evaluation clinically indicated. Please re-consult if further needs arise. Thanks.   Anne Shutter., SLP Student

## 2013-06-26 NOTE — ED Notes (Signed)
TeleNeurology on video

## 2013-06-26 NOTE — ED Notes (Addendum)
Patient ambulatory to restroom, steady gait noted

## 2013-06-26 NOTE — ED Notes (Signed)
CODE S called

## 2013-06-26 NOTE — Discharge Summary (Signed)
Discharge Summary       PATIENT ID: John Friedman  MRN: [id number]   DATE OF BIRTH: [birth year]    DATE OF ADMISSION: 06/26/2013  2:17 AM    DATE OF DISCHARGE: 06/26/2013   PRIMARY CARE PROVIDER: Antony Haste, MD       DISCHARGING PHYSICIAN: Woodroe Vogan A. Bobby Rumpf, NP    To contact this individual call [phone] and ask the operator to page.  If unavailable ask to be transferred the Adult Hospitalist Department.    CONSULTATIONS: IP CONSULT TO HOSPITALIST  IP CONSULT TO NEUROLOGY    PROCEDURES/SURGERIES: * No surgery found *    ADMITTING [place] COURSE:   John Friedman is a 47 year old male with history of chronic fatigue syndrome and tremors admitted for left sided numbness of left upper extremity. He has extensive history from chronic fatigue and sees Neurology, a Psychiatrist , and has an initial appointment with Dr. Uvaldo Rising (Movement Specialist) in January. He denied weakness on admission. Head CT negative. Admitted for possible CVA work up to include MRI (negative), carotid duplex (negative), and ECHO (done but not yet read). Lipid panel deferred to PCP as he would like to go home and follow up. He feels at his baseline and voiced he would like to continue his work up as an outpatient.     1. Left upper extremity numbness - Ongoing on and off for days. May be cervical radiculopathy given negative MRI of brain, negative carotid duplex. Recommend possible cervical spine MRI if indicated and continues. Of note he did lift heavy luggage ~ 2 weeks prior and felt neck and back strain at that time. ECHO done as part of initial work up but not yet read. Will call patient with result if needed.   2. Chronic hyponatremia (POA) - Na at his baseline between 129-130  3. Hypokalemia (POA) - Replaced.   4. Chronic fatigue syndrome - On Klonapin or Lyrica  5. Essential Tremors - He sees a Movement Disorder specialist in January for this and will continue home medications.   6. Left calf pain -  Doppler negative     PENDING TEST RESULTS:   At the time of discharge the following test results are still pending: None    FOLLOW UP APPOINTMENTS:    Follow-up Information    Follow up With Details Comments [facility], MD Schedule an appointment as soon as possible for a visit in 1 week           DIET: Regular Diet    ACTIVITY: Activity as tolerated    DISCHARGE MEDICATIONS:  Current Discharge Medication List      CONTINUE these medications which have NOT CHANGED    Details   LYRICA 300 mg capsule TAKE ONE (1) CAPSULE BY MOUTH ONCE A DAY  Qty: 90 capsule, Refills: 1      clonazepam (KLONOPIN) 1 mg tablet Take 1 mg by mouth two (2) times a day. 1.5 mg in the morning; .5 mg in the evening      OTHER       modafinil (PROVIGIL) 200 mg tablet Take 1 Tab by mouth daily.  Qty: 30 Tab, Refills: 0      clomiPRAMINE (ANAFRANIL) 50 mg capsule Take 3 Caps by mouth daily.  Qty: 270 Cap, Refills: 1    Associated Diagnoses: Sleep disturbances               NOTIFY YOUR PHYSICIAN  FOR ANY OF THE FOLLOWING:   Fever over 101 degrees for 24 hours.   Chest pain, shortness of breath, fever, chills, nausea, vomiting, diarrhea, change in mentation, falling, weakness, bleeding. Severe pain or pain not relieved by medications.  Or, any other signs or symptoms that you may have questions about.    DISPOSITION:    Home With:   OT  PT  HH  RN       Long term SNF/Inpatient Rehab    Independent/assisted living    Hospice    Other:       PATIENT CONDITION AT DISCHARGE:     Functional status    Poor     Deconditioned    x Independent      Cognition    x Lucid     Forgetful     Dementia      Catheters/lines (plus indication)    Foley     PICC     PEG    x None      Code status    x Full code     DNR      PHYSICAL EXAMINATION AT DISCHARGE:  Neuro: A&O x 4. Follows all commands. Speech clear. Significant essential tremor. 5/5 strength x 4 extremities. + numbness to thumb and 4th finger.  Lungs: CTAB  Heart: RRR  Abdomen: Soft,  NTND  Ext: No pedal edema. +2 distal pulses x 4      CHRONIC MEDICAL DIAGNOSES:  Problem List as of 06/26/2013 Date Reviewed: 06/26/2013        ICD-9-CM Class Noted - Resolved    Lumbar radiculopathy 724.4  08/10/2012 - Present    Overview    Signed 08/10/2012  7:48 AM by Antony Haste, MD      S/p steroid injection by Dr Laverta [place] 08/01/12          Subclinical hypothyroidism 244.8  Unknown - Present        Essential tremor 333.1  Unknown - Present        GERD (gastroesophageal reflux disease) 530.81  Unknown - Present    Overview    Signed 09/26/2011  7:16 AM by Antony Haste, MD      Normal EGD (09/09/11)          Testicular cancer 186.9  Unknown - Present    Overview    Signed 09/19/2011  8:54 AM by Antony Haste, MD      testicular cancer (Stage III)          Sleep disturbances 780.50  Unknown - Present        Chronic fatigue 780.79  08/15/1985 - Present        *RESOLVED: Weakness of left upper extremity 729.89  06/26/2013 - 06/26/2013              Greater than 35 minutes were spent with the patient on counseling and coordination of care    Signed:   Soyla Bainter A. Bobby Rumpf, NP  06/26/2013  4:46 PM

## 2013-06-26 NOTE — Progress Notes (Signed)
physical Therapy EVALUATION/DISCHARGE  Patient: John Friedman [id number] y.o. male)  Date: 06/26/2013  Primary Diagnosis: Weakness of left upper extremity        Precautions:      ASSESSMENT :  Based on the objective data described below, the patient presents with independent mobility for bed, transfers and gait.  He demonstrated safe gait pattern with no obvious deviations or tremors.  Managed step over step on ascending steps and no rail. Descended step over step with only occasional hold to rail.  Patient very focused on reported numbness in feet and arm at times.  Appears more anxious about whether or not he did the correct thing by calling 911 to come to hospital. Also anxious about having had a CT and worried about the effects of radiation to his brain.  Tried to assure patient that it was necessary to rule out neurologic events. Recommended to patient to pay attention to walking surfaces if his feet are numb and to check feet for cuts, bruising, etc.  Patient safe for discharge home from mobility aspect..    Skilled physical therapy is not indicated at this time.     PLAN :  Discharge Recommendations: None  Further Equipment Recommendations for Discharge: none     SUBJECTIVE:   Patient talking throughout the session about worries of numbness, whether he can walk with numbness,  Etc.    OBJECTIVE DATA SUMMARY:     Past Medical History   Diagnosis Date   ??? Sleep disturbances      acting out in his sleep   ??? Chronic fatigue January 1987   ??? Hypothyroid    ??? Essential tremor    ??? GERD (gastroesophageal reflux disease)    ??? Cancer      left testicle- stage III, nonseminomatous germ cell tumor, s/p orchiectomy & BEP x 3 cycles   ??? Normal cardiac stress test 09/05/11     Past Surgical History   Procedure Laterality Date   ??? Hx orchiectomy Left 2002   ??? Hx orthopaedic       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54   ??? Hx gi  09/09/11     EGD- normal   ??? Hx colonoscopy  07/12/10     hyperplastic polyp, repeat 10 yrs   ??? Hx  cholecystectomy  12/05/11   ??? Hx heent Left 06/2012     tonsilar bx: benign tissue (Dr Buddy Duty)     Prior Level of Function/Home Situation: independent   Home Situation  Home Environment: Apartment  # Steps to Enter: 15  One/Two Story Residence: One story  Living Alone: Yes   Support Systems: None  Patient Expects to be Discharged to:: Apartment  Current DME Used/Available at Home: None  Critical Behavior:  Neurologic State: Alert  Orientation Level: Oriented X4  Cognition: Follows commands     Strength:    Strength: Within functional limits            Tone & Sensation:   Tone: Normal         Sensation: Impaired (per patient numbness in feet and hands intermittantly)         Range Of Motion:  AROM: Within functional limits      PROM: Within functional limits      Coordination:  Coordination: Within functional limits    Functional Mobility:  Bed Mobility:  Rolling: Independent  Supine to Sit: Independent  Sit to Supine: Independent  Scooting: Independent  Transfers:  Sit to  Stand: Independent  Stand to Sit: Independent                       Balance:   Sitting: Intact  Standing: Intact  Ambulation/Gait Training:  Distance (ft): 350 Feet (ft)     Ambulation - Level of Assistance: Completely independent     Gait Description (WDL): Exceptions to WDL       Stairs:  Number of Stairs Trained: 15  Stairs - Level of Assistance: Completely independent   Rail Use:  (ocassional on descent)     Pain:  Pain Scale 1: Numeric (0 - 10)  Pain Intensity 1: 0     Activity Tolerance:   VSS  After treatment:   []    Patient left in no apparent distress sitting up in chair  [x]    Patient left in no apparent distress in bed  [x]    Call bell left within reach  [x]    Nursing notified  []    Caregiver present  []    Bed alarm activated    COMMUNICATION/EDUCATION:   Communication/Collaboration:  []    Fall prevention education was provided and the patient/caregiver indicated understanding.  [x]    Patient/family have participated as able and agree with  findings and recommendations.  []    Patient is unable to participate in plan of care at this time.  Findings and recommendations were discussed with: Occupational Therapist and Registered Nurse    Thank you for this referral.  Carlisha Wisler S Anaisabel Pederson, PT   Time Calculation: 25 mins

## 2013-06-26 NOTE — Other (Signed)
TRANSFER - OUT REPORT:    Verbal report given to [place] (name) on John Friedman  being transferred to [place] (unit) for routine progression of care       Report consisted of patient???s Situation, Background, Assessment and   Recommendations(SBAR).     Information from the following report(s) ED Summary was reviewed with the receiving nurse.    Opportunity for questions and clarification was provided.

## 2013-06-26 NOTE — H&P (Signed)
H&P dictated.  Job# [id number].

## 2013-06-26 NOTE — ED Notes (Signed)
Triage Note: Patient is coming in with complaints of left hand burning and weakness x 1 hour. Patient started with left leg weakness en route to the hospital. Patient sent straight to CT scan of head upon arrival to the ED. Dr. Luetta Nutting wanted to hold off on calling the Code Stroke.

## 2013-06-26 NOTE — ED Notes (Signed)
Triage Note: Pt brought in by EMS with complaints of weakness on left side. Pt stated that he started having weakness in his left arm and left leg about and hour and a half ago. Pt has a hx of chronic fatigue and tremor. Pt present very anxious and shaking.

## 2013-06-26 NOTE — Telephone Encounter (Signed)
Script printed.  Can mail to patient.  He is currently admitted to [facility]

## 2013-06-27 NOTE — Procedures (Signed)
[facility]  *** FINAL REPORT ***    Name: John Friedman, John Friedman  MRN: [id number]  DOB: Jun 30, 1966  HIS Order #: [id number]  [place] Visit #: [id number]  Date: 26 Jun 2013    TYPE OF TEST: Cerebrovascular Duplex    REASON FOR TEST  Cerebrovascular accident    Right Carotid:-             Proximal               Mid                 Distal  cm/s  Systolic  Diastolic  Systolic  Diastolic  Systolic  Diastolic  CCA:     99991111      22.0                            69.0      23.0  Bulb:  ECA:     81.0      14.0  ICA:     66.0      23.0                            68.0      29.0  ICA/CCA:  1.0       1.0    ICA Stenosis:    Right Vertebral:-  Finding: Antegrade  Sys:       42.0  Dia:    Right Subclavian:    Left Carotid:-            Proximal                Mid                 Distal  cm/s  Systolic  Diastolic  Systolic  Diastolic  Systolic  Diastolic  CCA:     [id number]      24.0                            87.0      29.0  Bulb:  ECA:     84.0      14.0  ICA:     82.0      26.0                            84.0      35.0  ICA/CCA:  0.9       0.9    ICA Stenosis:    Left Vertebral:-  Finding: Antegrade  Sys:       48.0  Dia:    Left Subclavian:    INTERPRETATION/FINDINGS  PROCEDURE:  Color duplex ultrasound imaging of extracranial  cerebrovascular arteries.    FINDINGS:       Right:  Internal carotid velocity is within normal limits, there  is no observed narrowing of the flow channel on color Doppler imaging,   and no plaque is visualized on B-mode imaging.  The common and  external carotid arteries are patent and without evidence of  hemodynamically significant stenosis.       Left:  Internal carotid velocity is within normal limits, there  appears to be minimal narrowing of the flow channel on color Doppler  imaging, and mixed density plaque is visualized on B-mode imaging,  consistent with  less than 50 percent stenosis.  The common and  external carotid arteries are patent and without evidence of  hemodynamically significant  stenosis.    IMPRESSION:  Consistent with no stenosis of the right internal carotid   and minimal to no stenosis of the left internal carotid.  Vertebrals  are patent with antegrade flow.    ADDITIONAL COMMENTS    I have personally reviewed the data relevant to the interpretation of  this  study.    TECHNOLOGIST: Lilli Light, RVT  Signed: 06/26/2013 09:23 AM    PHYSICIAN: Vernard Gambles., MD  Signed: 06/27/2013 07:58 AM

## 2013-06-27 NOTE — Progress Notes (Signed)
Hospital Follow Up for [facility] Admission on 06/26/13 for extremity numbness    Pt presented to ED c/o left hand burning and left sided weakness x 1 hour.  He also c/o burning "in my stomach, my head, left arm and left leg". Symptoms notably remain constant without specific known aggravating or alleviating factors, moderate severity.  CT Head was negative for acute intracranial abnormalities. 12-lead EKG showed normal sinus rhythm, nonspecific ST and T-wave changes at 86 beats per minute. The patient was seen by Neurology Telemedicine stroke specialist, who felt the patient needs admission for workup. TPA was offered; patient declined.  Labs show Na 129, K 3.3, otherwise negative.  Pt admitted to Hospitalist service.    Treatment Plan:  1. Acute left-sided weakness-- r/o stroke/transient ischemic attack. Neurovascular checks, fall precautions. Consult with neurologist. Order MRI of the brain (negative), carotid Doppler (negative), 2D echocardiogram (EF 65 %. There were no regional wall motion abnormalities) Continue aspirin therapy.   2. Hyponatremia-- IVF normal saline. Repeat sodium level in the a.m. Monitor closely. Check urine sodium osmolality.   4. Hypokalemia-- Replace potassium, repeat potassium levels post-replacement.   5. Essential tremor-- chronic   6. Mild leukocytosis-- Repeat CBC in a.m.   7. Anxiety-- Resume home medications.   8. Chronic fatigue syndrome--baseline.   9. Left calf pain-- Order a duplex of the left lower extremity to rule out any possible deep vein thrombosis, given history.   10. Venous thromboembolism prophylaxis.     Neuro Consult:  1. Neurological examination reveals mainly intentional>postural UE tremors and left hemisensory deficit not loss. Possible right thalamic stroke vs peripheral nerve related. Also likely with essential tremors.   2. Agree with obtaining a brain MRI with DWI to assess for a stroke.   3. Carotid doppler was unremarkable. No DVT of LE doppler.   4. Awaiting  echocardiogram.   5. Lipid panel pending.   6. Continue Aspirin for stroke prophylaxis.   7. Baseline evaluations by PT and OT.    Pt admitted around 2am and by 5pm, pt felt better and would prefer to continue treatment on an OP basis.  He was d/c home with no new prescriptions.  He is instructed to f/u with PCP.    Called pt--  Verified with 2 identifiers.  Says arms and legs continuing to bother him.  Stated a physician mentioned to him that it could be a pinched nerve.  So he would like to discuss getting further testing done.  Medications reviewed.  Scheduled a 77min f/u appt with Dr. Amedeo Plenty on 07/05/13 at 11:45am.

## 2013-06-27 NOTE — Procedures (Signed)
[facility]  *** FINAL REPORT ***    Name: CHEYENE, STUDE  MRN: [id number]    Inpatient  DOB: [birth year]  HIS Order #: [id number]  [place] Visit #: [id number]  Date: 26 Jun 2013    TYPE OF TEST: Peripheral Venous Testing    REASON FOR TEST  Pain in limb    Left Leg:-  Deep venous thrombosis:           No  Superficial venous thrombosis:    No  Deep venous insufficiency:        Not examined  Superficial venous insufficiency: Not examined      INTERPRETATION/FINDINGS  PROCEDURE:  Color duplex ultrasound imaging of lower extremity veins.    FINDINGS:       Right: The common femoral vein is patent on color Doppler imaging   and phasic flow is observed.  This extremity was not otherwise  evaluated.       Left:   The common femoral, deep femoral, femoral, popliteal,  posterior tibial, peroneal, and great saphenous are visualized; each  is compressible and no narrowing of the flow channel on color Doppler  imaging is observed.  Phasic flow is observed in the common femoral  vein.    IMPRESSION:  No evidence of left lower extremity vein thrombosis.    ADDITIONAL COMMENTS    I have personally reviewed the data relevant to the interpretation of  this  study.    TECHNOLOGIST: Lilli Light, RVT  Signed: 06/26/2013 09:16 AM    PHYSICIAN: Vernard Gambles., MD  Signed: 06/27/2013 07:57 AM

## 2013-07-05 NOTE — Progress Notes (Signed)
HISTORY OF PRESENT ILLNESS  John Friedman is a 47 y.o. male.  HPI  Hospital Follow Up  John Friedman is seen for follow up from recent admission to [facility] on 06/26/13.  We reviewed the notes from last encounter, lab results, imaging.  He presented with left arm & left leg numbness/burning.  He reports symptoms are improved, but have not resolved.  Work up in the hospital included a nl MRI brain, nl carotid artery [place], nl LE doppler, and nl TTE.  He reports he is having pain in his L thumb when he hyperextends it, but not at rest.  He is having numbness in both upper arms, but L is much worse then the R.  It is located over the deltoid (he grabs the outer aspect of his upper arm).  He reports L numbness is the whole posterior calf.    He reports no obvious trigger or obvious etiology of his symptoms.      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  Then when he travels or overdoes it causes flare of his pain.        Prior to Admission medications    Medication Sig Start Date End Date Taking? Authorizing Provider   propranolol (INDERAL) 20 mg tablet Take 20 mg by mouth as needed.   Yes Historical Provider   LYRICA 300 mg capsule TAKE ONE (1) CAPSULE BY MOUTH ONCE A DAY 06/26/13  Yes John Haste, MD   clonazepam [facility]) 1 mg tablet Take 1 mg by mouth two (2) times a day. 1.5 mg in the morning; .5 mg in the evening   Yes Historical Provider   OTHER    Yes Historical Provider   modafinil (PROVIGIL) 200 mg tablet Take 1 Tab by mouth daily. 12/26/12  Yes John Haste, MD   clomiPRAMINE (ANAFRANIL) 50 mg capsule Take 3 Caps by mouth daily. 12/04/12  Yes John Haste, MD          No Known Allergies         Review of Systems   Constitutional: Positive for malaise/fatigue. Negative for weight loss.   HENT: Negative for neck pain.    Musculoskeletal: Negative for joint pain.   Neurological: Positive for tremors, sensory change and headaches. Negative  for focal weakness.       Physical Exam   Constitutional: No distress.   Cardiovascular: Regular rhythm and normal heart sounds.    No murmur heard.  Pulmonary/Chest: Effort normal and breath sounds normal. He has no wheezes. He has no rales.   Musculoskeletal:        Left shoulder: He exhibits normal range of motion, no deformity and normal strength.        Cervical back: He exhibits normal range of motion, no tenderness and no bony tenderness.        Right lower leg: He exhibits no tenderness, no bony tenderness and no deformity.        Left lower leg: He exhibits no tenderness, no bony tenderness and no deformity.   Neurological:   John Friedman is 5/5 in upper extremity/lower extremity.  There was pain in L deltoid region w/ elbow flexion.   Tremor       BP 120/78   Pulse 74   Temp(Src) 97.7 ??F (36.5 ??C) (Oral)   Resp 20   Ht 5\' 10"  (1.778 m)   Wt 182 lb (82.555 kg)  BMI 26.11 kg/m2      ASSESSMENT and PLAN  John Friedman was seen today for hospital follow up.  Diagnoses and associated orders for this visit:    Left arm pain- new dx to me, has been chronic for the last few weeks, reviewed his numbness & pain correlate more with a muscle then with a nerve distribution.  We reviewed this.  Recommended heat, rest, and consider PT.  Reviewed I did not think an MRI of the neck would help.  Will run it by his neurologist.  Will also notify him of recent admission (unsure if he is aware)    Calf pain, left- new dx to me, has been chronic for the last month (was seen at [facility] previously- note reviewed).  Reassured no evidence of DVT and also no evidence of neuropathy.  Does sound more muscular, likely overuse.  Reviewed NSAIDs and rest at this time.        Declined flu shot again        Follow-up Disposition:  Return if symptoms worsen or fail to improve.   Advised him to call back or return to office if symptoms worsen/change/persist.  Discussed expected course/resolution/complications of diagnosis in detail with  patient.    Medication risks/benefits/costs/interactions/alternatives discussed with patient.  He was given an after visit summary which includes diagnoses, current medications, & vitals.  He expressed understanding with the diagnosis and plan.

## 2013-07-05 NOTE — Progress Notes (Signed)
Hospital follow up.

## 2013-07-08 NOTE — Telephone Encounter (Signed)
TC to patient. Advised notes have been faxed to Dr. Luetta Nutting for review.

## 2013-07-08 NOTE — Telephone Encounter (Signed)
LM for Dr Maretta Bees to return call.

## 2013-07-08 NOTE — Progress Notes (Signed)
Hospital H&P, Discharge Summary, MRI report and CT report faxed to Dr. Maretta Bees as per Dr. Amedeo Plenty.  Fax # [phone].

## 2013-07-08 NOTE — Progress Notes (Signed)
Faxed office note to [facility] @ [phone], per [facility]' order.

## 2013-07-08 NOTE — Telephone Encounter (Signed)
I would like to speak to Dr Maretta Bees please, call [facility]

## 2013-07-08 NOTE — Telephone Encounter (Signed)
Message copied by Stanford Scotland on Mon Jul 08, 2013 10:08 AM  ------       Message from: [employer] [place] A       Created: Mon Jul 08, 2013  7:50 AM       Regarding: cph          waiting to hear back regarding specialist                               Cosme, Christell Steinmiller D - Dr Hayes/telephone More Detail >>                        Dr  Jacqulyn Liner          Belva Bertin Cosme [phone])                        Sent: Fri July 05, 2013  5:21 PM          To: [place]          MRN: [id number] DOB: February 13, 1966                 Pt Home: [phone]                                                   Message          Pt advised he is waiting to hear from Dr in regards to Dr Luetta Nutting. Best contact [phone]                                               ------

## 2013-07-09 NOTE — Telephone Encounter (Signed)
Message copied by Pecola Lawless on Tue Jul 09, 2013  9:53 AM  ------       Message from: Renaee Munda P       Created: Mon Jul 08, 2013  5:43 PM         Call pt.  Notify him I spoke to Dr Maretta Bees on Monday.  Notes from [facility] & our appointment were faxed for his review.  It does not sound neurologist to him.  Numbness in the shoulder & legs sounds more muscular then anything.  Heat, rest are the treatments.  If no changes should notify me and will need to investigate further.  Dr Maretta Bees did not recommend MRI or any other testing at this time  ------

## 2013-07-09 NOTE — Telephone Encounter (Signed)
Advised pt per Dr.Hayes' note regarding numbness/pain, and discussion with Dr.Schulman. Patient verbalized understanding.

## 2013-07-10 ENCOUNTER — Ambulatory Visit (HOSPITAL_BASED_OUTPATIENT_CLINIC_OR_DEPARTMENT_OTHER): Payer: Managed Care, Other (non HMO) | Admitting: Hematology & Oncology

## 2013-07-10 ENCOUNTER — Other Ambulatory Visit (HOSPITAL_BASED_OUTPATIENT_CLINIC_OR_DEPARTMENT_OTHER): Payer: Managed Care, Other (non HMO) | Admitting: Lab

## 2013-07-10 VITALS — BP 109/69 | HR 81 | Temp 97.9°F | Resp 18 | Ht 71.0 in | Wt 176.0 lb

## 2013-07-10 DIAGNOSIS — C6290 Malignant neoplasm of unspecified testis, unspecified whether descended or undescended: Secondary | ICD-10-CM

## 2013-07-10 DIAGNOSIS — E785 Hyperlipidemia, unspecified: Secondary | ICD-10-CM

## 2013-07-10 DIAGNOSIS — D61818 Other pancytopenia: Secondary | ICD-10-CM

## 2013-07-10 DIAGNOSIS — Z8547 Personal history of malignant neoplasm of testis: Secondary | ICD-10-CM

## 2013-07-10 DIAGNOSIS — E291 Testicular hypofunction: Secondary | ICD-10-CM

## 2013-07-10 LAB — CBC WITH DIFFERENTIAL (CANCER CENTER ONLY)
BASO#: 0 10*3/uL (ref 0.0–0.2)
BASO%: 0.5 % (ref 0.0–2.0)
EOS%: 2.2 % (ref 0.0–7.0)
HCT: 37.5 % — ABNORMAL LOW (ref 38.7–49.9)
HGB: 12.6 g/dL — ABNORMAL LOW (ref 13.0–17.1)
LYMPH#: 0.9 10*3/uL (ref 0.9–3.3)
LYMPH%: 22.9 % (ref 14.0–48.0)
MCH: 31 pg (ref 28.0–33.4)
MCHC: 33.6 g/dL (ref 32.0–35.9)
MCV: 92 fL (ref 82–98)
MONO%: 14.3 % — ABNORMAL HIGH (ref 0.0–13.0)
NEUT%: 60.1 % (ref 40.0–80.0)
RDW: 12.7 % (ref 11.1–15.7)

## 2013-07-10 NOTE — Progress Notes (Signed)
This office note has been dictated.

## 2013-07-13 LAB — COMPREHENSIVE METABOLIC PANEL
ALT: 17 U/L (ref 0–53)
AST: 17 U/L (ref 0–37)
BUN: 10 mg/dL (ref 6–23)
Calcium: 8.9 mg/dL (ref 8.4–10.5)
Chloride: 98 mEq/L (ref 96–112)
Creatinine, Ser: 0.87 mg/dL (ref 0.50–1.35)
Total Bilirubin: 0.3 mg/dL (ref 0.3–1.2)

## 2013-07-13 LAB — AFP TUMOR MARKER: AFP-Tumor Marker: 5.7 ng/mL (ref 0.0–8.0)

## 2013-07-13 LAB — BETA HCG QUANT (REF LAB): Beta hCG, Tumor Marker: <2 m[IU]/mL (ref <5.0)

## 2013-07-13 LAB — LIPID PANEL
Cholesterol: 168 mg/dL (ref 0–200)
Total CHOL/HDL Ratio: 3.4 Ratio
Triglycerides: 80 mg/dL (ref <150)

## 2013-07-20 NOTE — Progress Notes (Signed)
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  CURRENT THERAPY:  Observation.  INTERIM HISTORY:  Charles Lowe comes in for followup.  We see him once a year.  His lives in [place] now.  He is doing okay in [place].  He is still doing work for [employer].  He has had no problems with nausea or vomiting.  He had his gallbladder taken out, I think, back in April of last year.  He has had no abdominal pain.  He has had no change in bowel or bladder habits.  He still has his tremors.  He is dealing with this.  There has been no change in his medications.  He has had no problems with rash.  He has had no fever.  He has had no visual issues.  When we followed up with his tumor markers, his alpha fetoprotein was 4.7 last year.  Beta hCG was 0.5.  PHYSICAL EXAMINATION:  General:  This is a fairly well-developed, well- nourished white gentleman, in no obvious distress.  Vital Signs: Temperature of 97.9, pulse 81, respiratory rate 18, blood pressure 109/69.  Weight is 176 pounds.  Head and Neck:  Normocephalic, atraumatic skull.  There are no ocular or oral lesions.  He has no palpable cervical or supraclavicular lymph nodes.  Lungs:  Clear bilaterally.  Cardiac:  Regular rate and rhythm, with normal S1 and S2. There are no murmurs, rubs, or bruits.  Abdomen:  Soft.  He has good bowel sounds.  There is no fluid wave.  There is no palpable abdominal mass.  There is no palpable hepatosplenomegaly.  He has a well-healed left inguinal orchiectomy scar.  Back:  No tenderness over the spine, ribs, or hips.  Extremities:  No clubbing, cyanosis, or edema. Neurological:  No focal neurological deficits.  LABORATORY STUDIES:  White cell count is 3.7, hemoglobin 12.6, hematocrit 37.5, platelet count 136; MCV is 92.  IMPRESSION:  Charles Lowe is a 47 year old gentleman.  He has a history of stage III non-seminomatous germ cell tumor of the left  testicle.  He underwent orchiectomy.  He had 3 cycles of BEP.  In my mind, I believe he is cured of his testicular cancer.  I have noted that his blood counts are little on the lower side.  I am not sure as to why this would be.  However, he is, I think, 11 years or so out from his treatments.  As such, there is a risk of him developing myelodysplasia from his chemotherapy.  Before I become "too aggressive," I want to repeat his labs in 1 month.  If I see that his blood counts continue to "weaken," then I think we are going to have to consider a bone-marrow test on him.  Again, he would be at risk for myelodysplasia secondary to his chemotherapy.  We will go ahead and plan to have him come back just for laboratory work in 1 month.  I will plan to get him back to see me in another 6 months, if all looks good in December.  I talked to Charles Lowe about this.  This is unexpected.  I told him that I wanted to be cautious and that I wanted to make sure that we were not overlooking any complications of his past therapy.  He understands this.    ______________________________ Josph Macho, M.D. PRE/MEDQ  D:  07/10/2013  T:  07/19/2013  Job:  [id number]

## 2013-07-24 ENCOUNTER — Telehealth: Payer: Self-pay | Admitting: Hematology & Oncology

## 2013-07-24 NOTE — Telephone Encounter (Signed)
Pt called back said what I told him about scheduling lab sooner doesn't make since. I transferred call to RN

## 2013-07-24 NOTE — Telephone Encounter (Signed)
Pt left message wanting to be seen sooner. Per MD ok for pt to get lab sooner. I left pt message to call and schedule appointment sooner if he wants.

## 2013-07-25 ENCOUNTER — Telehealth: Payer: Self-pay | Admitting: Hematology & Oncology

## 2013-07-25 NOTE — Telephone Encounter (Signed)
Pt made 12-19 lab appointment

## 2013-08-02 ENCOUNTER — Other Ambulatory Visit (HOSPITAL_BASED_OUTPATIENT_CLINIC_OR_DEPARTMENT_OTHER): Payer: Managed Care, Other (non HMO) | Admitting: Lab

## 2013-08-02 ENCOUNTER — Telehealth: Payer: Self-pay | Admitting: Hematology & Oncology

## 2013-08-02 DIAGNOSIS — D61818 Other pancytopenia: Secondary | ICD-10-CM

## 2013-08-02 DIAGNOSIS — Z8547 Personal history of malignant neoplasm of testis: Secondary | ICD-10-CM

## 2013-08-02 LAB — CBC WITH DIFFERENTIAL (CANCER CENTER ONLY)
BASO#: 0 10*3/uL (ref 0.0–0.2)
BASO%: 0.5 % (ref 0.0–2.0)
EOS%: 1.2 % (ref 0.0–7.0)
Eosinophils Absolute: 0.1 10*3/uL (ref 0.0–0.5)
HCT: 40.7 % (ref 38.7–49.9)
HGB: 13.9 g/dL (ref 13.0–17.1)
LYMPH%: 35.6 % (ref 14.0–48.0)
MCH: 31.3 pg (ref 28.0–33.4)
MCV: 92 fL (ref 82–98)
MONO#: 0.6 10*3/uL (ref 0.1–0.9)
MONO%: 14.7 % — ABNORMAL HIGH (ref 0.0–13.0)
NEUT#: 2.1 10*3/uL (ref 1.5–6.5)
NEUT%: 48 % (ref 40.0–80.0)
Platelets: 187 10*3/uL (ref 145–400)
RBC: 4.44 10*6/uL (ref 4.20–5.70)
RDW: 12.8 % (ref 11.1–15.7)
WBC: 4.3 10*3/uL (ref 4.0–10.0)

## 2013-08-02 LAB — CHCC SATELLITE - SMEAR

## 2013-08-02 NOTE — Telephone Encounter (Signed)
Patient was here for lab apt.  Patient spoke with MD.  Per Md to cx may apt and resch patient for march.  Patient scheduled apt for 11/01/13

## 2013-08-09 ENCOUNTER — Other Ambulatory Visit: Payer: Self-pay | Admitting: Nurse Practitioner

## 2013-08-09 ENCOUNTER — Other Ambulatory Visit: Payer: Managed Care, Other (non HMO) | Admitting: Lab

## 2013-08-09 DIAGNOSIS — Z8547 Personal history of malignant neoplasm of testis: Secondary | ICD-10-CM

## 2013-09-24 NOTE — Progress Notes (Signed)
Patient c/o nausea and diarrhea.
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
## 2013-10-02 NOTE — Progress Notes (Signed)
Follow up for post hospitalization episode dated 06/27/13 for weakness, numbness.  Pt presented for his f/u appt with Dr. Amedeo Plenty on 07/05/13.  This will close the episode.

## 2013-10-07 MED ORDER — FLUTICASONE 50 MCG/ACTUATION NASAL SPRAY, SUSP
50 mcg/actuation | Freq: Every day | NASAL | Status: DC
Start: 2013-10-07 — End: 2015-01-29

## 2013-10-07 NOTE — Telephone Encounter (Signed)
Please call patient.  Find out if he is getting this through me or the specialist now (I think it is the specialist)

## 2013-10-07 NOTE — Progress Notes (Signed)
Patient c/o head and chest congestion; cough.

## 2013-10-07 NOTE — Patient Instructions (Signed)
Sinusitis: After Your Visit  Your Care Instructions     Sinusitis is an infection of the lining of the sinus cavities in your head. Sinusitis often follows a cold. It causes pain and pressure in your head and face.  In most cases, sinusitis gets better on its own in 1 to 2 weeks. But some mild symptoms may last for several weeks. Sometimes antibiotics are needed.  Follow-up care is a key part of your treatment and safety. Be sure to make and go to all appointments, and call your doctor if you are having problems. It's also a good idea to know your test results and keep a list of the medicines you take.  How can you care for yourself at home?  ?? Take an over-the-counter pain medicine, such as acetaminophen (Tylenol), ibuprofen (Advil, Motrin), or naproxen (Aleve). Read and follow all instructions on the label.  ?? If the doctor prescribed antibiotics, take them as directed. Do not stop taking them just because you feel better. You need to take the full course of antibiotics.  ?? Be careful when taking over-the-counter cold or flu medicines and Tylenol at the same time. Many of these medicines have acetaminophen, which is Tylenol. Read the labels to make sure that you are not taking more than the recommended dose. Too much acetaminophen (Tylenol) can be harmful.  ?? Breathe warm, moist air from a steamy shower, a hot bath, or a sink filled with hot water. Avoid cold, dry air. Using a humidifier in your home may help. Follow the directions for cleaning the machine.  ?? Use saline (saltwater) nasal washes to help keep your nasal passages open and wash out mucus and bacteria. You can buy saline nose drops at a grocery store or drugstore. Or you can make your own at home by adding 1 teaspoon of salt and 1 teaspoon of baking soda to 2 cups of distilled water. If you make your own, fill a bulb syringe with the solution, insert the tip into your nostril, and squeeze gently. Blow your nose.  ?? Put a hot, wet towel or a warm gel  pack on your face 3 or 4 times a day for 5 to 10 minutes each time.  ?? Try a decongestant nasal spray like oxymetazoline (Afrin). Do not use it for more than 3 days in a row. Using it for more than 3 days can make your congestion worse.  When should you call for help?  Call your doctor now or seek immediate medical care if:  ?? You have new or worse swelling or redness in your face or around your eyes.  ?? You have a new or higher fever.  Watch closely for changes in your health, and be sure to contact your doctor if:  ?? You have new or worse facial pain.  ?? The mucus from your nose becomes thicker (like pus) or has new blood in it.  ?? You are not getting better as expected.   Where can you learn more?   Go to http://www.healthwise.net/[facility]  Enter I933 in the search box to learn more about "Sinusitis: After Your Visit."   ?? 2006-2014 Healthwise, Incorporated. Care instructions adapted under license by [employer] (which disclaims liability or warranty for this information). This care instruction is for use with your licensed healthcare professional. If you have questions about a medical condition or this instruction, always ask your healthcare professional. Healthwise, Incorporated disclaims any warranty or liability for your use of this information.    Content Version: 10.2.346038; Current as of: October 24, 2012        Saline Nasal Washes: After Your Visit  Your Care Instructions  Saline nasal washes help keep the nasal passages open by washing out thick or dried mucus. This simple remedy can help relieve symptoms of allergies, sinusitis, and colds. It also can make the nose feel more comfortable by keeping the mucous membranes moist. You may notice a little burning sensation in your nose the first few times you use the solution, but this usually gets better in a few days.  Follow-up care is a key part of your treatment and safety. Be sure to make and go to all appointments, and call your doctor if you are having  problems. It???s also a good idea to know your test results and keep a list of the medicines you take.  How can you care for yourself at home?  ?? You can buy premixed saline solution in a squeeze bottle or other sinus rinse products at a drugstore. Read and follow the instructions on the label.  ?? You also can make your own saline solution by adding 1 teaspoon of salt and 1 teaspoon of baking soda to 2 cups of distilled water.  ?? If you use a homemade solution, pour a small amount into a clean bowl. Using a rubber bulb syringe, squeeze the syringe and place the tip in the salt water. Pull a small amount of the salt water into the syringe by relaxing your hand.  ?? Sit down with your head tilted slightly back. Do not lie down. Put the tip of the bulb syringe or the squeeze bottle a little way into one of your nostrils. Gently squirt a small amount (about 1 teaspoon) into the nostril. Repeat with the other nostril. Some sneezing and gagging are normal at first.  ?? Gently blow your nose.  ?? Wipe the syringe or bottle tip clean after each use.  ?? Repeat this 2 or 3 times a day.  ?? Use nasal washes gently if you have nosebleeds often.  When should you call for help?  Watch closely for changes in your health, and be sure to contact your doctor if:  ?? You often get nosebleeds.  ?? You have problems doing the nasal washes.   Where can you learn more?   Go to [employer].cy  Enter B784 in the search box to learn more about "Saline Nasal Washes: After Your Visit."   ?? 2006-2014 Healthwise, Incorporated. Care instructions adapted under license by [employer] (which disclaims liability or warranty for this information). This care instruction is for use with your licensed healthcare professional. If you have questions about a medical condition or this instruction, always ask your healthcare professional. [place] any warranty or liability for your use of this information.  Content  Version: 10.2.346038; Current as of: October 24, 2012

## 2013-10-07 NOTE — Telephone Encounter (Signed)
Called pt to inquire if inderal is being rx'd by a specialist per Dr. Amedeo Plenty; Called # on file, unable to reach pt or leave message.

## 2013-10-07 NOTE — Progress Notes (Signed)
HISTORY OF PRESENT ILLNESS  John Friedman is a 48 y.o. male.  HPI  URI Review  [name] is a 48 y.o. male who is here to talk about: cough for 2 days ago, unchanged since that time.  He describes cough as: mostly dry, productive in the AM, seems to be constant throughout the day.  He also reports sore throat, bilateral ear fullness, fatigue, chest pain from coughing, chest congestion, and sinus congestion.  He denies a history of post nasal drip, fever, chills, wheezing and SOB/DOE.  Evaluation to date: none.  Treatment to date: none.  Relevant PMH: No pertinent additional PMH.  Patient reports sick contacts: no.          Prior to Admission medications    Medication Sig Start Date End Date Taking? Authorizing Provider   armodafinil (NUVIGIL) 200 mg tab Take 1 Tab by mouth daily as needed.   Yes Historical Provider   propranolol (INDERAL) 20 mg tablet Take 20 mg by mouth as needed.   Yes Historical Provider   LYRICA 300 mg capsule TAKE ONE (1) CAPSULE BY MOUTH ONCE A DAY 06/26/13  Yes Antony Haste, MD   clonazepam [facility]) 1 mg tablet Take 1 mg by mouth two (2) times a day.   Yes Historical Provider   OTHER    Yes Historical Provider   clomiPRAMINE (ANAFRANIL) 50 mg capsule Take 3 Caps by mouth daily. 12/04/12  Yes Antony Haste, MD   primidone (MYSOLINE) 50 mg tablet Take 50 mg by mouth. 08/22/13   Historical Provider          Allergies   Allergen Reactions   ??? Latex Other (comments)     redness          ROS : per HPI     Physical Exam   Constitutional: No distress.   HENT:   Right Ear: Tympanic membrane is injected. Tympanic membrane is not erythematous and not bulging. A middle ear effusion (minimal) is present.   Left Ear: Tympanic membrane is injected. Tympanic membrane is not erythematous and not bulging. A middle ear effusion (minimal) is present.   Nose: Rhinorrhea present. No mucosal edema. Right sinus exhibits no maxillary sinus tenderness and no frontal sinus tenderness. Left  sinus exhibits no maxillary sinus tenderness and no frontal sinus tenderness.   Mouth/Throat: Uvula is midline and mucous membranes are normal. No oropharyngeal exudate or posterior oropharyngeal erythema.   Eyes: Conjunctivae are normal. No scleral icterus.   Neck: Neck supple.   Cardiovascular: Regular rhythm and normal heart sounds.  Tachycardia present.    No murmur heard.  Pulmonary/Chest: Effort normal and breath sounds normal. He has no wheezes. He has no rales.   Lymphadenopathy:     He has no cervical adenopathy.       BP 108/74    Pulse 102    Temp(Src) 97.9 ??F (36.6 ??C) (Oral)    Resp 20    Ht 5\' 10"  (1.778 m)    Wt 177 lb 3.2 oz (80.377 kg)    BMI 25.43 kg/m2         ASSESSMENT and PLAN  Chael was seen today for uri.  Diagnoses and associated orders for this visit:    URI (upper respiratory infection)- discussed diagnosis & treatment options, sounds more sinus related & likely viral, reviewed the importance of avoiding unnecessary antibiotic therapy, red flags were reviewed with the patient to RTC or notify me.  Expected time course for  resolution reviewed with patient.  strat meds below & mucinex-DM OTC.    - fluticasone (FLONASE) 50 mcg/actuation nasal spray; 2 Sprays by Both Nostrils route daily.        Follow-up Disposition:  Return if symptoms worsen or fail to improve.   Advised him to call back or return to office if symptoms worsen/change/persist.  Discussed expected course/resolution/complications of diagnosis in detail with patient.    Medication risks/benefits/costs/interactions/alternatives discussed with patient.  He was given an after visit summary which includes diagnoses, current medications, & vitals.  He expressed understanding with the diagnosis and plan.

## 2013-10-11 NOTE — Telephone Encounter (Signed)
LVM for patient to call back about a medication refill for Inderal.

## 2013-10-14 NOTE — Telephone Encounter (Signed)
-----   Message from Phyllis Ginger sent at 10/14/2013  8:17 AM EST -----  Regarding: cph-  [phone]       Pt has some questions about his medication and is still not feeling well

## 2013-10-14 NOTE — Telephone Encounter (Signed)
Pt returned previous call; pt states Dr. Delsa Bern usually fill this rx and he will contact that provider to refill.

## 2013-10-14 NOTE — Telephone Encounter (Signed)
I saw him on 2/23, so he went to the ED that same day?  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  At this time it is likely viral and treatment is going to be time.  I would not do anything different until I can review his records

## 2013-10-14 NOTE — Telephone Encounter (Signed)
Called and spoke with pt who informing Dr. Amedeo Plenty that inderal is usually filled by Dr. Delsa Bern and that he will contact to have refilled by him.

## 2013-10-14 NOTE — Telephone Encounter (Signed)
Called and spoke with pt, who stated he went to [facility] in [place] the same evening of his last OV with Dr. Amedeo Plenty. The provider at [facility] then sent him to the ED. Pt advised that what he's currently exp is likely viral and no other tx recommendations but time per Dr. Amedeo Plenty. Awaiting records from urgent care and ED visit before further tx decision is made. Pt verbalized understanding.

## 2013-10-14 NOTE — Telephone Encounter (Signed)
Called and spoke with pt, who reports still exp chest congestion and productive cough with yellow phlegm that has been present for 10 days now; pt stated [facility], where he was prescribed Levaquin. Pt reports feeling better, but not completely well since taking last dose yesterday and is inquiring if he needs another round of abx or need OV for further eval.

## 2013-10-22 NOTE — Telephone Encounter (Signed)
Pt stated he is going to fax "high mold count results" to Dr.Hayes to review. Pt requested Dr.Hayes' recommendation/suggestion as to who he should see and determine the best treatment option.

## 2013-10-22 NOTE — Telephone Encounter (Signed)
Would recommend ID specialist is the most likely person to talk to.  Give him Dr Duwayne Heck information.  If not ID then ENT.

## 2013-10-22 NOTE — Telephone Encounter (Signed)
-----   Message from Elease Etienne sent at 10/22/2013 12:02 PM EDT -----  Regarding: [place]- mold  Contact: [phone]  Would like to speak to nurse about test results he just rec'd.- Also would like a recommendation for a mold expert and wants to discuss getting CBC ordered/checked.

## 2013-10-23 NOTE — Telephone Encounter (Signed)
Advised pt per Dr.Hayes' note regarding seeing ID, Dr.Gonzales or ENT. Patient verbalized understanding, gave pt Dr.Gonzales' office info to contact for appt.

## 2013-10-23 NOTE — Telephone Encounter (Signed)
-----   Message from Antony Haste, MD sent at 10/23/2013  3:13 PM EDT -----  Regarding: FW: [facility]PH - need recommendation to specialist   Since it is sinus related I would recommend ENT.  Harle Battiest or [place] [facility].    ----- Message -----     From: Megan Mans, LPN     Sent: [id number]   3:07 PM       To: Antony Haste, MD  Subject: [name]: [facility] - need recommendation to specialist      Please advise.  Thanks!   ----- Message -----     From: John Friedman     Sent: 10/23/2013   2:46 PM       To: [place] [facility] [employer] Pool  Subject: [name] - need recommendation to specialist          Dr Lucious Groves [phone]) office called to let us know that he does not handle Mold Exposure.  Suggested seeing Allergist or Dermatology which may be a better fit.  Please recommend some specialist to Pt.  Thank you.

## 2013-10-23 NOTE — Telephone Encounter (Signed)
Advised pt to contact ENT for appt, as Dr.Gonazels does not handle mold exposure. Patient verbalized understanding.

## 2013-10-29 ENCOUNTER — Telehealth: Payer: Self-pay | Admitting: Hematology & Oncology

## 2013-10-29 NOTE — Telephone Encounter (Signed)
Pt moved 3-20 to 5-21. I left RN voice mail

## 2013-11-01 ENCOUNTER — Other Ambulatory Visit: Payer: Managed Care, Other (non HMO) | Admitting: Lab

## 2013-11-01 ENCOUNTER — Ambulatory Visit: Payer: Managed Care, Other (non HMO) | Admitting: Hematology & Oncology

## 2013-12-10 NOTE — Telephone Encounter (Signed)
-----   Message from Phyllis Ginger sent at 12/10/2013 12:48 PM EDT -----  Regarding: cph-dr  [phone]      Pt would like a recommendation to a eye DR

## 2013-12-10 NOTE — Telephone Encounter (Signed)
Dr Richardean Canal is close ([address]) or [facility].

## 2013-12-10 NOTE — Telephone Encounter (Signed)
Advised pt per [facility]' recommendation for Dr.Mullens at [facility]. Patient verbalized understanding.

## 2013-12-10 NOTE — Telephone Encounter (Signed)
Pt stated he needs to new glasses and is requesting Dr.Hayes' recommendation for an eye doctor. Pt denies pain/injury, " just need an eye exam."

## 2014-01-02 ENCOUNTER — Encounter: Payer: Self-pay | Admitting: Hematology & Oncology

## 2014-01-02 ENCOUNTER — Ambulatory Visit (HOSPITAL_BASED_OUTPATIENT_CLINIC_OR_DEPARTMENT_OTHER): Payer: Managed Care, Other (non HMO) | Admitting: Hematology & Oncology

## 2014-01-02 ENCOUNTER — Other Ambulatory Visit (HOSPITAL_BASED_OUTPATIENT_CLINIC_OR_DEPARTMENT_OTHER): Payer: Managed Care, Other (non HMO) | Admitting: Lab

## 2014-01-02 VITALS — BP 94/63 | HR 82 | Temp 97.5°F | Resp 18 | Ht 69.0 in | Wt 174.0 lb

## 2014-01-02 DIAGNOSIS — Z8547 Personal history of malignant neoplasm of testis: Secondary | ICD-10-CM

## 2014-01-02 DIAGNOSIS — D61818 Other pancytopenia: Secondary | ICD-10-CM

## 2014-01-02 LAB — CBC WITH DIFFERENTIAL (CANCER CENTER ONLY)
BASO#: 0 10*3/uL (ref 0.0–0.2)
BASO%: 0.8 % (ref 0.0–2.0)
EOS ABS: 0 10*3/uL (ref 0.0–0.5)
EOS%: 1 % (ref 0.0–7.0)
HCT: 39 % (ref 38.7–49.9)
HGB: 13.5 g/dL (ref 13.0–17.1)
LYMPH#: 1.1 10*3/uL (ref 0.9–3.3)
LYMPH%: 29.7 % (ref 14.0–48.0)
MCH: 31.5 pg (ref 28.0–33.4)
MCHC: 34.6 g/dL (ref 32.0–35.9)
MCV: 91 fL (ref 82–98)
MONO#: 0.5 10*3/uL (ref 0.1–0.9)
MONO%: 13 % (ref 0.0–13.0)
NEUT%: 55.5 % (ref 40.0–80.0)
NEUTROS ABS: 2.1 10*3/uL (ref 1.5–6.5)
Platelets: 148 10*3/uL (ref 145–400)
RBC: 4.28 10*6/uL (ref 4.20–5.70)
RDW: 12.9 % (ref 11.1–15.7)
WBC: 3.8 10*3/uL — ABNORMAL LOW (ref 4.0–10.0)

## 2014-01-02 LAB — CHCC SATELLITE - SMEAR

## 2014-01-02 NOTE — Progress Notes (Signed)
Hematology and Oncology Follow Up Visit  Charles Lowe [id number] Jun 11, 1966 48 y.o. 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  Current Therapy:    Observation     Interim History:  [name] is back for followup. Last him back in November. I was a lowered his blood counts. His platelet count was going down. I thought that we might be having some issues with the past chemotherapy that he had.  Him for see no problems with mold exposure. He is being checked for this. Is having some test for this. He is being treated for this.  He still working. He works for a [employer].  His had little but of a cough. His chronic fatigue. He has chronic chills.  Medications: Current outpatient prescriptions:Amphotericin B POWD, Inhale into the lungs. PT NOT SURE OF STRENGTH, BUT  " IT IS A NASAL SPRAY ", Disp: , Rfl: ;  clomiPRAMINE (ANAFRANIL) 50 MG capsule, Take 150 mg by mouth at bedtime., Disp: , Rfl: ;  clonazePAM (KLONOPIN) 0.5 MG tablet, Take 0.5 mg by mouth 2 (two) times daily as needed. For anxiety, Disp: , Rfl: ;  fluconazole (DIFLUCAN) 200 MG tablet, Take 200 mg by mouth daily., Disp: , Rfl:  pregabalin (LYRICA) 300 MG capsule, Take 300 mg by mouth at bedtime. , Disp: , Rfl: ;  propranolol (INDERAL) 20 MG tablet, Take 20 mg by mouth as needed. For tremors, Disp: , Rfl:   Allergies:  Allergies  Allergen Reactions  . Latex     CAUSES SKIN REDNESS    Past Medical History, Surgical history, Social history, and Family History were reviewed and updated.  Review of Systems: As above  Physical Exam:  height is 5\' 9"  (1.753 m) and weight is 174 lb (78.926 kg). His oral temperature is 97.5 F (36.4 C). His blood pressure is 94/63 and his pulse is 82. His respiration is 18.   Pale white gentleman in no obvious distress. Lungs are clear. Cardiac exam regular rhythm. Abdomen soft. Has good bowel sounds. He  has well-healed left inguinal orchiectomy. Is no palpable liver or spleen. Exam no tenderness over the spine. Extremities no clubbing cyanosis or edema. Skin exam no rashes act was or petechia. Neurological exam is nonfocal.  Lab Results  Component Value Date   WBC 3.8* 01/02/2014   HGB 13.5 01/02/2014   HCT 39.0 01/02/2014   MCV 91 01/02/2014   PLT 148 01/02/2014     Chemistry      Component Value Date/Time   NA 138 01/02/2014 0843   K 4.1 01/02/2014 0843   CL 103 01/02/2014 0843   CO2 25 01/02/2014 0843   BUN 13 01/02/2014 0843   CREATININE 1.02 01/02/2014 0843      Component Value Date/Time   CALCIUM 8.8 01/02/2014 0843   ALKPHOS 123* 01/02/2014 0843   AST 20 01/02/2014 0843   ALT 21 01/02/2014 0843   BILITOT 0.4 01/02/2014 0843         Impression and Plan: Charles Lowe is 48 year old woman with a past history of stage III none seminomatous germ cell  tumor of the left testicle. He did receive systemic chemotherapy with BEP for 3 cycles. He completed this back in 2001.  Am glad that his blood gas doing better right now. His white cell count has always been a little on the low side.  We will plan to get him back in another  6 months. His mom lives in town. He probably will come to visit her in the fall around the holidays.   [name], MD 5/21/20156:46 PM

## 2014-01-07 LAB — COMPREHENSIVE METABOLIC PANEL
ALBUMIN: 4.1 g/dL (ref 3.5–5.2)
ALK PHOS: 123 U/L — AB (ref 39–117)
ALT: 21 U/L (ref 0–53)
AST: 20 U/L (ref 0–37)
BUN: 13 mg/dL (ref 6–23)
CO2: 25 mEq/L (ref 19–32)
Calcium: 8.8 mg/dL (ref 8.4–10.5)
Chloride: 103 mEq/L (ref 96–112)
Creatinine, Ser: 1.02 mg/dL (ref 0.50–1.35)
GLUCOSE: 84 mg/dL (ref 70–99)
POTASSIUM: 4.1 meq/L (ref 3.5–5.3)
SODIUM: 138 meq/L (ref 135–145)
TOTAL PROTEIN: 6.4 g/dL (ref 6.0–8.3)
Total Bilirubin: 0.4 mg/dL (ref 0.2–1.2)

## 2014-01-07 LAB — LACTATE DEHYDROGENASE: LDH: 140 U/L (ref 94–250)

## 2014-01-07 LAB — RETICULOCYTES (CHCC)
ABS RETIC: 39.5 10*3/uL (ref 19.0–186.0)
RBC.: 4.39 MIL/uL (ref 4.22–5.81)
Retic Ct Pct: 0.9 % (ref 0.4–2.3)

## 2014-01-07 LAB — AFP TUMOR MARKER: AFP-Tumor Marker: 6.6 ng/mL (ref 0.0–8.0)

## 2014-01-07 LAB — BETA HCG QUANT (REF LAB): Beta hCG, Tumor Marker: <2 m[IU]/mL (ref <5.0)

## 2014-01-09 ENCOUNTER — Telehealth: Payer: Self-pay | Admitting: *Deleted

## 2014-01-09 ENCOUNTER — Other Ambulatory Visit: Payer: Managed Care, Other (non HMO) | Admitting: Lab

## 2014-01-09 ENCOUNTER — Ambulatory Visit: Payer: Managed Care, Other (non HMO) | Admitting: Hematology & Oncology

## 2014-01-09 NOTE — Telephone Encounter (Addendum)
Message copied by Lenn Sink on Thu Jan 09, 2014  9:18 AM ------      Message from: Burney Gauze R      Created: Wed Jan 08, 2014  5:54 PM       Please call and let him know that the lab studies and tumor levels all are still normal. Thanks. Pete ------Informed pt that labs are okay and tumor levels normal.

## 2014-04-09 NOTE — Progress Notes (Signed)
John Friedman is a 48 y.o. male with hx of testicular cancer who was referred by Dr. Jake Michaelis. Patient reports daily episodes of diarrhea twice a day, is concerned about papilloma/sore throat for 3 months,and has been experiencing some nausea once or twice per week.

## 2014-04-09 NOTE — Progress Notes (Signed)
Hematology/Oncology Consult    REASON FOR CONSULT: Pancytopenia  REQUESTED BY: Dr. Buddy Duty    HISTORY OF PRESENT ILLNESS: John Friedman is a 48 y.o. male who presents for evaluation of pancytopenia.    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  Surveillance scans stopped in 2012.      When he saw his treating oncologist in [place] in November 2014 he had pancytopenia.  There was fear of MDS at that time.  Did not have a bone marrow biopsy.    He has had thrush for the past 3 months.      No fevers or chills. No night sweats. No weight loss.  No new aches or pains.  No new lumps or bumps.  No CP/SOB/DOE.  Has had diarrhea x 5 weeks.  No nausea, vomiting, or constipation.  No bleeding.    He has plans for biopsy of papilloma uvula planned by Dr. Buddy Duty    Past Medical History   Diagnosis Date   ??? Sleep disturbances      acting out in his sleep   ??? Chronic fatigue January 1987   ??? Hypothyroid    ??? Essential tremor    ??? GERD (gastroesophageal reflux disease)    ??? Cancer ([place])      left testicle- stage III, nonseminomatous germ cell tumor, s/p orchiectomy & BEP x 3 cycles   ??? Normal cardiac stress test 09/05/11       Past Surgical History   Procedure Laterality Date   ??? Hx orchiectomy Left 2002   ??? 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 Hx gi  09/09/11     EGD- normal   ??? Hx colonoscopy  07/12/10     hyperplastic polyp, repeat 10 yrs   ??? Hx cholecystectomy  12/05/11   ??? Hx heent Left 06/2012     tonsilar bx: benign tissue (Dr Buddy Duty)       Allergies   Allergen Reactions   ??? Latex Other (comments)     redness       Current Outpatient Prescriptions   Medication Sig Dispense Refill   ??? armodafinil (NUVIGIL) 200 mg tab Take 1 Tab by mouth daily as needed.     ??? propranolol (INDERAL) 20 mg tablet Take 20 mg by mouth as needed.     ??? LYRICA 300 mg capsule TAKE ONE (1) CAPSULE BY MOUTH ONCE A DAY 90 capsule 1   ??? clonazepam (KLONOPIN) 1 mg tablet Take 1 mg by mouth two (2) times a day.      ??? OTHER      ??? clomiPRAMINE (ANAFRANIL) 50 mg capsule Take 3 Caps by mouth daily. 270 Cap 1   ??? fluticasone (FLONASE) 50 mcg/actuation nasal spray 2 Sprays by Both Nostrils route daily. 1 Bottle 0   ??? primidone (MYSOLINE) 50 mg tablet Take 50 mg by mouth.         History     Social History   ??? Marital Status: SINGLE     Spouse Name: N/A     Number of Children: N/A   ??? Years of Education: N/A     Social History Main Topics   ??? Smoking status: Never Smoker    ??? Smokeless tobacco: Never Used   ??? Alcohol Use: Yes      Comment: rarely   ??? Drug Use: No   ??? Sexual Activity: No  Other Topics Concern   ??? Not on file     Social History Narrative       Family History   Problem Relation Age of Onset   ??? Parkinsonism Father    ??? Cancer Maternal Grandfather      prostate   ??? Cancer Paternal Grandfather      prostate       ROS  As per the HPI, otherwise a comprehensive ROS is negative.    Physical Examination:   BP 126/89 mmHg   Pulse 101   Temp(Src) 97.4 ??F (36.3 ??C) (Oral)   Resp 20   Ht '5\' 11"'  (1.803 m)   Wt 187 lb (84.823 kg)   BMI 26.09 kg/m2   SpO2 99%  General appearance - alert, well appearing, and in no distress  Mental status - oriented to person, place, and time  Mouth - mucous membranes moist, pharynx normal without lesions  Neck - supple, no significant adenopathy  Lymphatics - no palpable lymphadenopathy, no hepatosplenomegaly  Chest - clear to auscultation, no wheezes, rales or rhonchi, symmetric air entry  Heart - normal rate, regular rhythm, normal S1, S2, no murmurs, rubs, clicks or gallops  Abdomen - soft, nontender, nondistended, no masses or organomegaly, bowel sounds present  Back exam - full range of motion, no tenderness, palpable spasm or pain on motion  Neurological - normal speech, no focal findings or movement disorder noted  Musculoskeletal - no joint tenderness, deformity or swelling  Extremities - peripheral pulses normal, no pedal edema, no clubbing or cyanosis   Skin - normal coloration and turgor, no rashes, no suspicious skin lesions noted    LABS  Lab Results   Component Value Date/Time    WBC 3.5 06/26/2013 02:45 AM    HGB 13.5 06/26/2013 02:45 AM    HCT 38.8 06/26/2013 02:45 AM    PLATELET 161 06/26/2013 02:45 AM    MCV 88.2 06/26/2013 02:45 AM    ABS. NEUTROPHILS 1.7 06/26/2013 02:45 AM     Lab Results   Component Value Date/Time    SODIUM 129 06/26/2013 02:45 AM    POTASSIUM 3.3 06/26/2013 02:45 AM    CHLORIDE 94 06/26/2013 02:45 AM    CO2 25 06/26/2013 02:45 AM    GLUCOSE 82 06/26/2013 02:45 AM    BUN 11 06/26/2013 02:45 AM    CREATININE 0.80 06/26/2013 02:45 AM    GFR EST AA >60 06/26/2013 02:45 AM    GFR EST NON-AA >60 06/26/2013 02:45 AM    CALCIUM 9.3 06/26/2013 02:45 AM     Lab Results   Component Value Date/Time    AST 22 06/26/2013 02:45 AM    ALK. PHOSPHATASE 137 06/26/2013 02:45 AM    PROTEIN, TOTAL 7.4 06/26/2013 02:45 AM    ALBUMIN 4.3 06/26/2013 02:45 AM    GLOBULIN 3.1 06/26/2013 02:45 AM    A-G RATIO 1.4 06/26/2013 02:45 AM     Lab Results   Component Value Date/Time    LD 190 05/22/2013 02:33 PM    TSH 7.890 05/09/2013 11:10 AM    INR 1.0 06/26/2013 02:45 AM    D-DIMER <0.2 04/30/2012 07:06 AM     ASSESSMENT  John Friedman is a 48 y.o. male with history of testicular cancer who presents for evaluation of pancytopenia.      DISCUSSION/PLAN  1. Pancytopenia.  By history.  Let's recheck CBC, smear, ANA, ds-DNA, HIV test (given thrush and low counts).  If he has pancytopenia on my  eval and no clear cause, he should have bone marrow biopsy given his history of exposure to chemotherapy.     2. Recurrent infections.  Will check quantitative immunoglobulins in addition to above.    I'll see him back to review these tests in 1-2 weeks.    Wenda Low. Jacolby Risby, MD    John Friedman has a reminder for a "due or due soon" health maintenance. I have asked that he contact his primary care provider for follow-up on this health maintenance.

## 2014-04-20 LAB — PATHOLOGIST REVIEW SMEARS
Platelets: NORMAL
RBC: NORMAL

## 2014-04-20 LAB — CBC WITH AUTOMATED DIFF
ABS. BASOPHILS: 0 10*3/uL (ref 0.0–0.2)
ABS. EOSINOPHILS: 0.1 10*3/uL (ref 0.0–0.4)
ABS. IMM. GRANS.: 0 10*3/uL (ref 0.0–0.1)
ABS. MONOCYTES: 0.5 10*3/uL (ref 0.1–0.9)
ABS. NEUTROPHILS: 1.9 10*3/uL (ref 1.4–7.0)
Abs Lymphocytes: 1 10*3/uL (ref 0.7–3.1)
BASOPHILS: 1 %
EOSINOPHILS: 2 %
HCT: 41.5 % (ref 37.5–51.0)
HGB: 13.7 g/dL (ref 12.6–17.7)
IMMATURE GRANULOCYTES: 0 %
Lymphocytes: 30 %
MCH: 31.1 pg (ref 26.6–33.0)
MCHC: 33 g/dL (ref 31.5–35.7)
MCV: 94 fL (ref 79–97)
MONOCYTES: 13 %
NEUTROPHILS: 54 %
PLATELET: 183 10*3/uL (ref 150–379)
RBC: 4.4 x10E6/uL (ref 4.14–5.80)
RDW: 14 % (ref 12.3–15.4)
WBC: 3.4 10*3/uL (ref 3.4–10.8)

## 2014-04-20 LAB — METABOLIC PANEL, COMPREHENSIVE
A-G Ratio: 2.3 (ref 1.1–2.5)
ALT (SGPT): 27 IU/L (ref 0–44)
AST (SGOT): 22 IU/L (ref 0–40)
Albumin: 4.6 g/dL (ref 3.5–5.5)
Alk. phosphatase: 106 IU/L (ref 39–117)
BUN/Creatinine ratio: 6 — ABNORMAL LOW (ref 9–20)
BUN: 6 mg/dL (ref 6–24)
Bilirubin, total: 0.2 mg/dL (ref 0.0–1.2)
CO2: 21 mmol/L (ref 18–29)
Calcium: 8.9 mg/dL (ref 8.7–10.2)
Chloride: 95 mmol/L — ABNORMAL LOW (ref 97–108)
Creatinine: 0.97 mg/dL (ref 0.76–1.27)
GFR est AA: 106 mL/min/{1.73_m2} (ref >59)
GFR est non-AA: 91 mL/min/{1.73_m2} (ref >59)
GLOBULIN, TOTAL: 2 g/dL (ref 1.5–4.5)
Glucose: 85 mg/dL (ref 65–99)
Potassium: 3.9 mmol/L (ref 3.5–5.2)
Protein, total: 6.6 g/dL (ref 6.0–8.5)
Sodium: 135 mmol/L (ref 134–144)

## 2014-04-20 LAB — HIV 1/2 AB, BY IMMUNOBLOT
HIV 1/O/2 Abs, QL: NONREACTIVE
HIV 1/O/2 Abs, Qual: NONREACTIVE
HIV 1/O/2 Abs-Index Value: <1 (ref <1.00)
HIV 1/O/2 Abs: <1 (ref <1.00)

## 2014-04-20 LAB — DNA AB, DOUBLE STRANDED, IGG: Anti-DNA (DS) Ab, QT: <1 IU/mL (ref 0–9)

## 2014-04-20 LAB — ANA, DIRECT, W/REFLEX
ANA, DIRECT: NEGATIVE
Antinuclear Antibodies Direct: NEGATIVE

## 2014-04-20 LAB — IMMUNOGLOBULINS, G/A/M, QT.
Immunoglobulin A, Qt.: 95 mg/dL (ref 91–414)
Immunoglobulin G, Qt.: 665 mg/dL — ABNORMAL LOW (ref 700–1600)
Immunoglobulin M, Qt.: 76 mg/dL (ref 40–230)

## 2014-04-29 NOTE — Progress Notes (Signed)
Hematology/Oncology Progress Note    REASON FOR VISIT: Pancytopenia    HISTORY OF PRESENT ILLNESS: John Friedman is a 48 y.o. male who presents for follow-up of pancytopenia.    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  Surveillance scans stopped in 2012.      When he saw his treating oncologist in [place] in November 2014 he had pancytopenia.  There was fear of MDS at that time.  Did not have a bone marrow biopsy.    Presents today for follow-up.  No fevers or chills. No night sweats. No weight loss.    In general, though, still feels tired and has nausea and diarrhea.  No new aches or pains.  No new lumps or bumps.  No CP/SOB/DOE.  Has had diarrhea x 5 weeks.  No vomiting, or constipation.  No bleeding.    As before, he has had thrush for the past 3 months.      He has plans for biopsy of papilloma uvula planned by Dr. Buddy Duty    Past Medical History   Diagnosis Date   ??? Sleep disturbances      acting out in his sleep   ??? Chronic fatigue January 1987   ??? Hypothyroid    ??? Essential tremor    ??? GERD (gastroesophageal reflux disease)    ??? Cancer ([place])      left testicle- stage III, nonseminomatous germ cell tumor, s/p orchiectomy & BEP x 3 cycles   ??? Normal cardiac stress test 09/05/11       Past Surgical History   Procedure Laterality Date   ??? Hx orchiectomy Left 2002   ??? Hx orthopaedic       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23   ??? Hx gi  09/09/11     EGD- normal   ??? Hx colonoscopy  07/12/10     hyperplastic polyp, repeat 10 yrs   ??? Hx cholecystectomy  12/05/11   ??? Hx heent Left 06/2012     tonsilar bx: benign tissue (Dr Buddy Duty)       Allergies   Allergen Reactions   ??? Latex Other (comments)     redness       Current Outpatient Prescriptions   Medication Sig Dispense Refill   ??? armodafinil (NUVIGIL) 200 mg tab Take 1 Tab by mouth daily as needed.     ??? propranolol (INDERAL) 20 mg tablet Take 20 mg by mouth as needed.     ??? LYRICA 300 mg capsule TAKE ONE (1) CAPSULE BY MOUTH ONCE A DAY 90 capsule 1    ??? clonazepam (KLONOPIN) 1 mg tablet Take 1 mg by mouth two (2) times a day.     ??? OTHER      ??? clomiPRAMINE (ANAFRANIL) 50 mg capsule Take 3 Caps by mouth daily. 270 Cap 1   ??? fluticasone (FLONASE) 50 mcg/actuation nasal spray 2 Sprays by Both Nostrils route daily. 1 Bottle 0   ??? primidone (MYSOLINE) 50 mg tablet Take 50 mg by mouth.         History     Social History   ??? Marital Status: SINGLE     Spouse Name: N/A     Number of Children: N/A   ??? Years of Education: N/A     Social History Main Topics   ??? Smoking status: Never Smoker    ??? Smokeless tobacco: Never Used   ??? Alcohol Use: Yes      Comment:  rarely   ??? Drug Use: No   ??? Sexual Activity: No     Other Topics Concern   ??? Not on file     Social History Narrative       Family History   Problem Relation Age of Onset   ??? Parkinsonism Father    ??? Cancer Maternal Grandfather      prostate   ??? Cancer Paternal Grandfather      prostate       ROS  As per the HPI, otherwise a comprehensive ROS is negative.    Physical Examination:   BP 109/65 mmHg   Pulse 116   Temp(Src) 98 ??F (36.7 ??C) (Oral)   Resp 18   Ht '5\' 11"'  (1.803 m)   Wt 179 lb (81.194 kg)   BMI 24.98 kg/m2   SpO2 97%  General appearance - alert, well appearing, and in no distress  Mental status - oriented to person, place, and time  Mouth - mucous membranes moist, pharynx normal without lesions  Neck - supple, no significant adenopathy  Lymphatics - no palpable lymphadenopathy, no hepatosplenomegaly  Chest - clear to auscultation, no wheezes, rales or rhonchi, symmetric air entry  Heart - normal rate, regular rhythm, normal S1, S2, no murmurs, rubs, clicks or gallops  Abdomen - soft, nontender, nondistended, no masses or organomegaly, bowel sounds present  Back exam - full range of motion, no tenderness, palpable spasm or pain on motion  Neurological - normal speech, no focal findings or movement disorder noted  Musculoskeletal - no joint tenderness, deformity or swelling   Extremities - peripheral pulses normal, no pedal edema, no clubbing or cyanosis  Skin - normal coloration and turgor, no rashes, no suspicious skin lesions noted    LABS  Lab Results   Component Value Date/Time    WBC 3.4 04/14/2014 07:04 AM    HGB 13.7 04/14/2014 07:04 AM    HCT 41.5 04/14/2014 07:04 AM    PLATELET 183 04/14/2014 07:04 AM    MCV 94 04/14/2014 07:04 AM    ABS. NEUTROPHILS 1.9 04/14/2014 07:04 AM     Lab Results   Component Value Date/Time    SODIUM 135 04/14/2014 07:04 AM    POTASSIUM 3.9 04/14/2014 07:04 AM    CHLORIDE 95 04/14/2014 07:04 AM    CO2 21 04/14/2014 07:04 AM    GLUCOSE 85 04/14/2014 07:04 AM    BUN 6 04/14/2014 07:04 AM    CREATININE 0.97 04/14/2014 07:04 AM    GFR EST AA 106 04/14/2014 07:04 AM    GFR EST NON-AA 91 04/14/2014 07:04 AM    CALCIUM 8.9 04/14/2014 07:04 AM     Lab Results   Component Value Date/Time    AST 22 04/14/2014 07:04 AM    ALK. PHOSPHATASE 106 04/14/2014 07:04 AM    PROTEIN, TOTAL 6.6 04/14/2014 07:04 AM    ALBUMIN 4.6 04/14/2014 07:04 AM    GLOBULIN 3.1 06/26/2013 02:45 AM    A-G RATIO 2.3 04/14/2014 07:04 AM     Lab Results   Component Value Date/Time    LD 190 05/22/2013 02:33 PM    TSH 7.890 05/09/2013 11:10 AM    INR 1.0 06/26/2013 02:45 AM    D-DIMER <0.2 04/30/2012 07:06 AM       ASSESSMENT  John Friedman is a 48 y.o. male with history of testicular cancer who presents for evaluation of pancytopenia.      DISCUSSION/PLAN  1. Pancytopenia.  His CBC is normal  on my check.  His WBC is low-normal, but other cell lines and WBC subtypes are normal.  If counts get worse, will do bone marrow.  For now, it could be followed by her PCP.     CBC, smear, ANA, ds-DNA, HIV test all normal.      2. Recurrent infections.  Quantitative immunoglobulins with mildly low IgG, but this can be due to his acute issues.    He should have a CBC checked twice a year by his PCP.  I can see him as needed.    He is fine to go ahead with any planned procedure/biopsy under the care of  Dr. Buddy Duty.     Wenda Low. Marvalene Barrett, MD    John Friedman has a reminder for a "due or due soon" health maintenance. I have asked that he contact his primary care provider for follow-up on this health maintenance.

## 2014-04-29 NOTE — Progress Notes (Signed)
John Friedman is a 48 y.o. male presents for follow up of Leukopenia. Patient complains of nausea without vomiting, diarrhea, and fatigue.

## 2014-08-13 ENCOUNTER — Ambulatory Visit
Admit: 2014-08-13 | Discharge: 2014-08-13 | Payer: PRIVATE HEALTH INSURANCE | Attending: Internal Medicine | Primary: Internal Medicine

## 2014-08-13 DIAGNOSIS — R197 Diarrhea, unspecified: Secondary | ICD-10-CM

## 2014-08-13 NOTE — Patient Instructions (Signed)
Gluten-Free Diet: Care Instructions  Your Care Instructions  To help your symptoms, your doctor has recommended a gluten-free diet. This means not eating foods that have gluten in them. Gluten is a kind of protein. It's found in wheat, barley, and rye.  If you eat a gluten-free diet, you can help manage your symptoms and prevent long-term problems. You can also get all the nutrition you need.  Follow-up care is a key part of your treatment and safety. Be sure to make and go to all appointments, and call your doctor if you are having problems. It's also a good idea to know your test results and keep a list of the medicines you take.  How can you care for yourself at home?  ?? Don't eat any foods that have gluten in them. These include bagels, bread, crackers, and some cereals. They also include pasta and pizza.  ?? Carefully read food labels. Look for wheat or wheat products in ice cream and candy. You may also find them in salad dressing, canned and frozen soups and vegetables, and other processed foods.  ?? Avoid all beer products unless the label says they are gluten-free. Beers with and without alcohol have gluten unless the labels say they are gluten-free. This includes lagers, ales, and stouts.  ?? When you eat out, look for restaurants that serve gluten-free food. You can also ask if the chef is familiar with gluten-free cooking.  ?? Try to learn more about gluten-free options. Find grocery stores that sell gluten-free pizza and other foods. If you have access to the Internet, look online for gluten-free foods and recipes.  ?? On a gluten-free eating plan, it's okay to have:  ?? Eggs and dairy products. (But some dairy products may make your symptoms worse. Ask your doctor if you have questions about dairy products. Read ingredient labels carefully. Some processed cheeses contain gluten.)  ?? Flours and foods made with amaranth, arrowroot, beans, buckwheat, corn,  cornmeal, flax, millet, potatoes, pure uncontaminated nut and oat bran, quinoa, rice, sorghum, soybeans, tapioca, or teff.  ?? Fresh, frozen, or canned unprocessed meats. But avoid processed meats. Some examples of processed meats to avoid are hot dogs, salami, and deli meat. Read labels for additives that may contain gluten.  ?? Fresh, frozen, dried, or canned fruits and vegetables, if they do not have thickeners or other additives that contain gluten.  ?? Some alcohol drinks. These include wine, liqueurs, and ciders. They also include liquor like whiskey and brandy.  When should you call for help?  Watch closely for changes in your health, and be sure to contact your doctor if:  ?? You have unexplained weight loss.  ?? You have diarrhea that lasts longer than 1 to 2 weeks.  ?? You have unusual fatigue or mood changes, especially if these last more than a week and are not related to any other illness, such as the flu.  ?? Your symptoms come back again.  ?? Your stomach pain gets worse.   Where can you learn more?   Go to [employer]  Enter O549 in the search box to learn more about "Gluten-Free Diet: Care Instructions."   ?? 2006-2015 [employer]. Care instructions adapted under license by [employer] (which disclaims liability or warranty for this information). This care instruction is for use with your licensed healthcare professional. If you have questions about a medical condition or this instruction, always ask your healthcare professional. [place] any warranty or liability for your use  of this information.  Content Version: 10.7.482551; Current as of: October 22, 2013

## 2014-08-13 NOTE — Progress Notes (Signed)
HPI:  John Friedman is a 48 y.o. year old male who returns to clinic today for routine follow up appointment to discuss the issues below:    GI Review  Patient complains of diarrhea.  This has been present for 1 year ago, fluctuating course since that time.  He reports started 1 year ago but around May '15 became more regular.  He reports stool is foamy, malodorous, and light colored.  He reports is intermittent going 3-4 timer per day and then several days w/o a BM.  He feels it is due to mold exposure in his previous apartment.  He moved & had all his possessions cleaned.  Since last visit has seen allergist, ENT, and oncologist.  Notes & labs all reviewed.  He did also establish w/ a physician that he reports specializes in mold.  He reports having tried multiple supplements & several meds.  He is on a supplement called Restore & has since cut out wheat x 1 week and reports this has helped.   His symptoms include nausea.  He denies belching, heartburn, melena and brbpr.         Prior to Admission medications    Medication Sig Start Date End Date Taking? Authorizing Provider   cholestyramine (QUESTRAN) 4 gram packet  07/24/14  Yes Historical Provider   LYRICA 300 mg capsule TAKE ONE (1) CAPSULE BY MOUTH ONCE A DAY 06/26/13  Yes Antony Haste, MD   clonazepam (KLONOPIN) 1 mg tablet Take 1 mg by mouth two (2) times a day.   Yes Historical Provider   OTHER    Yes Historical Provider   clomiPRAMINE (ANAFRANIL) 50 mg capsule Take 3 Caps by mouth daily. 12/04/12  Yes Antony Haste, MD   fluticasone [facility]) 50 mcg/actuation nasal spray 2 Sprays by Both Nostrils route daily. 10/07/13   Antony Haste, MD   armodafinil (NUVIGIL) 200 mg tab Take 1 Tab by mouth daily as needed.    Historical Provider   propranolol (INDERAL) 20 mg tablet Take 20 mg by mouth as needed.    Historical Provider          Allergies   Allergen Reactions   ??? Latex Other (comments)     redness           Review of Systems    Constitutional: Positive for malaise/fatigue. Negative for fever and chills.   Respiratory: Negative for cough and shortness of breath.    Cardiovascular: Negative for chest pain.         Physical Exam   Constitutional: No distress.   Eyes: Conjunctivae are normal. No scleral icterus.   Cardiovascular: Regular rhythm and normal heart sounds.    No murmur heard.  Pulmonary/Chest: Effort normal and breath sounds normal. He has no wheezes. He has no rales.   Abdominal: Soft. Bowel sounds are normal. He exhibits no mass. There is no hepatosplenomegaly. There is no tenderness.   Musculoskeletal:   Tremor in the R hand   Psychiatric: He has a normal mood and affect. His behavior is normal.         BP 106/74 mmHg   Pulse 94   Temp(Src) 97.9 ??F (36.6 ??C) (Oral)   Resp 16   Ht 5\' 11"  (1.803 m)   Wt 179 lb 12.8 oz (81.557 kg)   BMI 25.09 kg/m2   SpO2 98%      Assessment & Plan:  Khareem was seen today for diarrhea.  Diagnoses and all orders for this  visit:    Diarrhea- chronic, favor diet related, he is concerned about Celiac but I would favor gluten sensitivity vs lactose intolerance.  Reassured I do not think this is infectious or medication related.  Will keep an eye on diet.  Reviewed expectations.  Orders:  -     CELIAC ANTIBODY PROFILE    Need for hepatitis C screening test- no records available, he reports having done multiple times, will turn off reminder.        Reviewed the need for the flu vaccine, he opted not to get the flu vaccine today        Follow-up Disposition:  Return if symptoms worsen or fail to improve.   Advised him to call back or return to office if symptoms worsen/change/persist.  Discussed expected course/resolution/complications of diagnosis in detail with patient.    Medication risks/benefits/costs/interactions/alternatives discussed with patient.  He was given an after visit summary which includes diagnoses, current medications, & vitals.   He expressed understanding with the diagnosis and plan.

## 2014-08-16 DIAGNOSIS — F411 Generalized anxiety disorder: Secondary | ICD-10-CM

## 2014-08-16 NOTE — ED Notes (Signed)
Patient presents to the emergency department tremmorous reporting nausea and diarrhea for a year.  Patient reports he believes his furniture is making him ill.  Patient states he feels better after leaving his apartment for days.  Patient was seen at [facility] because he felt particularly poor.  Patient was sent by [facility] due to elevated white count and low electrolytes.

## 2014-08-16 NOTE — ED Notes (Signed)
Dr. Jimmye Norman made aware of pts formed stool sample.

## 2014-08-16 NOTE — ED Notes (Signed)
Pt requesting water to drink. Pt given water.

## 2014-08-16 NOTE — ED Provider Notes (Signed)
HPI Comments: 49 y.o. male with past medical history significant for sleep disturbances, chronic fatigue, hypothyroidism, essential tumor, GERD, left testicle tumor, and normal cardiac stress test who presents to the ED with chief complaint of diarrhea. Patient reports intermittent nausea and diarrhea with onset "a couple of years ago," reports ~10 episodes of diarrhea "today." Patient also reports a loss of appetite, cough, "slight" headache, and sore throat with onset "recently." Patient denies being seen by a GI specialist for his symptoms; reports being seen by his internist and being told his symptoms may be caused by "food sensitivity." Patient denies any food sensitivity to gluten or lactose. Patient denies owning any pets, including birds. Patient denies taking any trips out of the country "recently." Patient reports "(his) old apartment had mold in it," reports he moved out of his old apartment but has the same furniture. Patient reports "(his) apartment is making (him) ill." Patient reports a history of chemotherapy for testicular cancer ~14 years ago. Patient reports being told by his oncologist "last year" that his red and white blood cell counts and platelets were "low," reports his baseline blood count is "3.8 after the chemotherapy." Patient reports a history of an "essential tremor." Patient reports a history of a colonoscopy ~4 years ago. Patient denies abdominal pain, dysuria, and wheezing. There are no other acute medical concerns at this time.    PCP: Antony Haste, MD    Note written by Rutherford Guys, Scribe, as dictated by Elvera Lennox, MD 10:45 PM     The history is provided by the patient and the nursing home.        Past Medical History:   Diagnosis Date   ??? Sleep disturbances      acting out in his sleep   ??? Chronic fatigue January 1987   ??? Hypothyroid    ??? Essential tremor    ??? GERD (gastroesophageal reflux disease)    ??? Cancer ([place])       left testicle- stage III, nonseminomatous germ cell tumor, s/p orchiectomy & BEP x 3 cycles   ??? Normal cardiac stress test 09/05/11       Past Surgical History:   Procedure Laterality Date   ??? Hx orchiectomy Left 2002   ??? Hx orthopaedic       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25   ??? Hx gi  09/09/11     EGD- normal   ??? Hx colonoscopy  07/12/10     hyperplastic polyp, repeat 10 yrs   ??? Hx cholecystectomy  12/05/11   ??? Hx heent Left 06/2012     tonsilar bx: benign tissue (Dr Buddy Duty)         Family History:   Problem Relation Age of Onset   ??? Parkinsonism Father    ??? Cancer Maternal Grandfather      prostate   ??? Cancer Paternal Grandfather      prostate       History     Social History   ??? Marital Status: SINGLE     Spouse Name: N/A     Number of Children: N/A   ??? Years of Education: N/A     Occupational History   ??? Not on file.     Social History Main Topics   ??? Smoking status: Never Smoker    ??? Smokeless tobacco: Never Used   ??? Alcohol Use: Yes      Comment: rarely   ??? Drug Use: No   ???  Sexual Activity: No     Other Topics Concern   ??? Not on file     Social History Narrative                ALLERGIES: Latex      Review of Systems   Constitutional: Positive for appetite change. Negative for fever and chills.   HENT: Positive for sore throat. Negative for congestion.    Eyes: Negative for visual disturbance.   Respiratory: Positive for cough. Negative for chest tightness, shortness of breath and wheezing.    Cardiovascular: Negative for chest pain.   Gastrointestinal: Positive for nausea and diarrhea. Negative for vomiting and abdominal pain.   Genitourinary: Negative for dysuria and frequency.   Musculoskeletal: Negative for joint swelling.   Skin: Negative for rash.   Neurological: Positive for headaches. Negative for speech difficulty.   All other systems reviewed and are negative.      Filed Vitals:    08/16/14 2021   BP: 154/99   Pulse: 104   Temp: 97.3 ??F (36.3 ??C)   Height: 5\' 11"  (1.803 m)   Weight: 81.647 kg (180 lb)    SpO2: 94%            Physical Exam   Constitutional: He appears well-developed and well-nourished. No distress.   HENT:   Head: Normocephalic and atraumatic.   Eyes: Pupils are equal, round, and reactive to light.   Neck: Normal range of motion. Neck supple. No JVD present. No tracheal deviation present. No thyromegaly present.   Cardiovascular: Normal rate, regular rhythm and normal heart sounds.    No murmur heard.  Pulmonary/Chest: Effort normal and breath sounds normal. No respiratory distress.   Abdominal: Soft. Bowel sounds are normal. He exhibits no mass. There is no tenderness.   Abdomen is benign.    Musculoskeletal: Normal range of motion. He exhibits no edema.   Neurological: He is alert.   Alert.   Skin: Skin is warm and dry.   Psychiatric:   Jittery, anxious-appearing; disturbed speech.   Nursing note and vitals reviewed.  Note written by Rutherford Guys, Scribe, as dictated by Elvera Lennox, MD 11:09 PM     MDM    Procedures    PROGRESS NOTE:  10:38 PM  Per nurse, patient gave a stool sample that was completely formed. He still insisting that he has diarrhea. He believes that his wood furniture has mold, although there is no excessive moisture in his home. He is requesting a toxicologist. I am referring him back to his primary with ativan or increased klonopin recommended for his anxiety

## 2014-08-16 NOTE — ED Notes (Signed)
Discharge instructions given to pt.  All questions answered and pt verbalized understanding.   V/S stable @ time of discharge.  Pt ambulatory out of unit.

## 2014-08-16 NOTE — ED Notes (Signed)
Per pt report of diarrhea, was going to collect sample. Upon looking at sample, the sample was formed. Sample not collected.

## 2014-08-17 ENCOUNTER — Inpatient Hospital Stay
Admit: 2014-08-17 | Discharge: 2014-08-17 | Disposition: A | Discharge status: Home / Self Care | Payer: PRIVATE HEALTH INSURANCE | Attending: Emergency Medicine

## 2014-08-17 LAB — METABOLIC PANEL, COMPREHENSIVE
A-G Ratio: 1.4 (ref 1.1–2.2)
ALT (SGPT): 41 U/L (ref 12–78)
AST (SGOT): 23 U/L (ref 15–37)
Albumin: 4.6 g/dL (ref 3.5–5.0)
Alk. phosphatase: 151 U/L — ABNORMAL HIGH (ref 45–117)
Anion gap: 9 mmol/L (ref 5–15)
BUN/Creatinine ratio: 11 — ABNORMAL LOW (ref 12–20)
BUN: 10 MG/DL (ref 6–20)
Bilirubin, total: 0.4 MG/DL (ref 0.2–1.0)
CO2: 23 mmol/L (ref 21–32)
Calcium: 8.7 MG/DL (ref 8.5–10.1)
Chloride: 95 mmol/L — ABNORMAL LOW (ref 97–108)
Creatinine: 0.91 MG/DL (ref 0.70–1.30)
GFR est AA: >60 mL/min/{1.73_m2} (ref >60)
GFR est non-AA: >60 mL/min/{1.73_m2} (ref >60)
Globulin: 3.4 g/dL (ref 2.0–4.0)
Glucose: 94 mg/dL (ref 65–100)
Potassium: 3.4 mmol/L — ABNORMAL LOW (ref 3.5–5.1)
Protein, total: 8 g/dL (ref 6.4–8.2)
Sodium: 127 mmol/L — ABNORMAL LOW (ref 136–145)

## 2014-08-17 LAB — URINALYSIS W/ REFLEX CULTURE
Bacteria: NEGATIVE /hpf
Bilirubin: NEGATIVE
Blood: NEGATIVE
Glucose: NEGATIVE mg/dL
Ketone: 15 mg/dL — AB
Leukocyte Esterase: NEGATIVE
Nitrites: NEGATIVE
Protein: NEGATIVE mg/dL
Specific gravity: 1.005 (ref 1.003–1.030)
Urobilinogen: 0.2 EU/dL (ref 0.2–1.0)
pH (UA): 7 (ref 5.0–8.0)

## 2014-08-17 LAB — CBC WITH AUTOMATED DIFF
ABS. BASOPHILS: 0 10*3/uL (ref 0.0–0.1)
ABS. EOSINOPHILS: 0 10*3/uL (ref 0.0–0.4)
ABS. LYMPHOCYTES: 0.9 10*3/uL (ref 0.8–3.5)
ABS. MONOCYTES: 0.8 10*3/uL (ref 0.0–1.0)
ABS. NEUTROPHILS: 6.5 10*3/uL (ref 1.8–8.0)
BASOPHILS: 0 % (ref 0–1)
EOSINOPHILS: 0 % (ref 0–7)
HCT: 42.9 % (ref 36.6–50.3)
HGB: 14.8 g/dL (ref 12.1–17.0)
LYMPHOCYTES: 11 % — ABNORMAL LOW (ref 12–49)
MCH: 31.4 PG (ref 26.0–34.0)
MCHC: 34.5 g/dL (ref 30.0–36.5)
MCV: 90.9 FL (ref 80.0–99.0)
MONOCYTES: 10 % (ref 5–13)
NEUTROPHILS: 79 % — ABNORMAL HIGH (ref 32–75)
PLATELET: 179 10*3/uL (ref 150–400)
RBC: 4.72 M/uL (ref 4.10–5.70)
RDW: 12.4 % (ref 11.5–14.5)
WBC: 8.3 10*3/uL (ref 4.1–11.1)

## 2014-08-17 MED ORDER — ONDANSETRON (PF) 4 MG/2 ML INJECTION
4 mg/2 mL | INTRAMUSCULAR | Status: AC
Start: 2014-08-17 — End: 2014-08-16
  Administered 2014-08-17: 04:00:00 via INTRAVENOUS

## 2014-08-17 MED ORDER — LORAZEPAM 0.5 MG TAB
0.5 mg | ORAL_TABLET | Freq: Three times a day (TID) | ORAL | Status: DC | PRN
Start: 2014-08-17 — End: 2015-01-29

## 2014-08-17 MED ORDER — LORAZEPAM 2 MG TAB
2 mg | ORAL | Status: AC
Start: 2014-08-17 — End: 2014-08-16
  Administered 2014-08-17: 05:00:00 via ORAL

## 2014-08-17 MED ORDER — SODIUM CHLORIDE 0.9% BOLUS IV
0.9 % | Freq: Once | INTRAVENOUS | Status: AC
Start: 2014-08-17 — End: 2014-08-16
  Administered 2014-08-17: 02:00:00 via INTRAVENOUS

## 2014-08-17 MED ORDER — LORAZEPAM 2 MG/ML IJ SOLN
2 mg/mL | INTRAMUSCULAR | Status: AC
Start: 2014-08-17 — End: 2014-08-16

## 2014-08-17 MED ORDER — ONDANSETRON (PF) 4 MG/2 ML INJECTION
4 mg/2 mL | INTRAMUSCULAR | Status: AC
Start: 2014-08-17 — End: 2014-08-16
  Administered 2014-08-17: 02:00:00 via INTRAVENOUS

## 2014-08-17 MED ORDER — SODIUM CHLORIDE 0.9% BOLUS IV
0.9 % | Freq: Once | INTRAVENOUS | Status: AC
Start: 2014-08-17 — End: 2014-08-16
  Administered 2014-08-17: 04:00:00 via INTRAVENOUS

## 2014-08-17 MED ORDER — ONDANSETRON HCL 8 MG TAB
8 mg | ORAL_TABLET | Freq: Three times a day (TID) | ORAL | Status: DC | PRN
Start: 2014-08-17 — End: 2015-01-29

## 2014-08-17 MED ORDER — LORAZEPAM 2 MG/ML IJ SOLN
2 mg/mL | INTRAMUSCULAR | Status: AC
Start: 2014-08-17 — End: 2014-08-16
  Administered 2014-08-17: 04:00:00 via INTRAVENOUS

## 2014-08-17 MED FILL — ONDANSETRON (PF) 4 MG/2 ML INJECTION: 4 mg/2 mL | INTRAMUSCULAR | Qty: 2

## 2014-08-17 MED FILL — SODIUM CHLORIDE 0.9 % IV: INTRAVENOUS | Qty: 1000

## 2014-08-17 MED FILL — LORAZEPAM 2 MG TAB: 2 mg | ORAL | Qty: 1

## 2014-08-17 MED FILL — LORAZEPAM 2 MG/ML IJ SOLN: 2 mg/mL | INTRAMUSCULAR | Qty: 1

## 2014-08-17 MED FILL — ONDANSETRON (PF) 4 MG/2 ML INJECTION: 4 mg/2 mL | INTRAMUSCULAR | Qty: 4

## 2014-08-18 NOTE — Telephone Encounter (Signed)
Called & spoke to pt.  Concerned about anxiety component as he reports sx all related to being in the same room as "moldy" furniture of several hrs and resolves after being away from furniture f or several hours.  Will see GI (Dr Margart Sickles).  Will cont w/ his mold specialist.  Still having formed stool.

## 2014-08-18 NOTE — Telephone Encounter (Signed)
Went to [facility]'s er on 08/16/13.

## 2014-08-18 NOTE — Telephone Encounter (Signed)
Pt seen by Dr. Amedeo Plenty on 08/13/14 for diarrhea, states was related to mold. Has since moved into new apartment and brought "moldy furniture with him that he had cleaned" and states that he started to feel "really sick" had diarrhea, nausea, increased HR and weakness. [facility] to [facility] and was sent by them to the ER. He states that he was given fluids and told there was nothing more that could be done as this was an ongoing chronic issue not an emergency. Pt wants another recommendation from Dr. Amedeo Plenty as to what else he can do seeing another specialist, etc. Please advise.

## 2014-08-18 NOTE — Telephone Encounter (Signed)
Pt says that he is not any better and needs to know what the next step to take.

## 2014-09-03 ENCOUNTER — Inpatient Hospital Stay: Admit: 2014-09-03 | Payer: PRIVATE HEALTH INSURANCE | Primary: Internal Medicine

## 2014-09-18 ENCOUNTER — Encounter

## 2014-09-29 ENCOUNTER — Ambulatory Visit: Payer: PRIVATE HEALTH INSURANCE | Primary: Internal Medicine

## 2014-10-17 ENCOUNTER — Telehealth: Payer: Self-pay

## 2014-10-17 NOTE — Telephone Encounter (Signed)
10/17/14 Disc Received unknown location not able to open to view. Filed on shelf / IH

## 2015-01-23 ENCOUNTER — Encounter: Attending: Internal Medicine | Primary: Internal Medicine

## 2015-01-29 ENCOUNTER — Ambulatory Visit
Admit: 2015-01-29 | Discharge: 2015-01-29 | Payer: PRIVATE HEALTH INSURANCE | Attending: Internal Medicine | Primary: Internal Medicine

## 2015-01-29 DIAGNOSIS — R200 Anesthesia of skin: Secondary | ICD-10-CM

## 2015-01-29 NOTE — Progress Notes (Signed)
HPI:  John Friedman is a 49 y.o. year old male who returns to clinic today for an acute visit to discuss the problem(s) below:    Neurological Review  He is here today to talk about burning in his feet.  This is a new problem problem, symptoms have remained unchanged, and have been present for 3 weeks.  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  He reports only on Monday and Tuesday, it is constant, starts mid Monday and by Wednesday morning has resolved.  He is worried about diabetes, neurology told him he has a fatty liver & he is aware that this could be due to DM.  He does sit crossed legged at work.  He does have a sedentary job but does move around a little.  He is working ~60 hr weeks recently.  Associated symptoms: nothing.  Tremor & diarrhe are stable, specialist notes reviewed.         Prior to Admission medications    Medication Sig Start Date End Date Taking? Authorizing Provider   dicyclomine (BENTYL) 10 mg capsule  01/28/15  Yes Historical Provider   colestipol (COLESTID) 1 gram tablet  01/05/15  Yes Historical Provider   cholestyramine (QUESTRAN) 4 gram packet  07/24/14  Yes Historical Provider   armodafinil (NUVIGIL) 200 mg tab Take 1 Tab by mouth daily as needed.   Yes Historical Provider   propranolol (INDERAL) 20 mg tablet Take 20 mg by mouth as needed.   Yes Historical Provider   LYRICA 300 mg capsule TAKE ONE (1) CAPSULE BY MOUTH ONCE A DAY 06/26/13  Yes Antony Haste, MD   clonazepam [facility]) 1 mg tablet Take 1 mg by mouth two (2) times a day.   Yes Historical Provider   OTHER    Yes Historical Provider   clomiPRAMINE (ANAFRANIL) 50 mg capsule Take 3 Caps by mouth daily. 12/04/12  Yes Antony Haste, MD   primidone (MYSOLINE) 50 mg tablet  11/17/14   Historical Provider          Allergies   Allergen Reactions   ??? Latex Other (comments)     redness           Review of Systems   Constitutional: Positive for malaise/fatigue. Negative for fever and chills.    Gastrointestinal: Positive for diarrhea. Negative for heartburn, nausea, vomiting and abdominal pain.   Neurological: Positive for tingling and tremors. Negative for dizziness and focal weakness.   Psychiatric/Behavioral: Negative for depression. The patient does not have insomnia.          Physical Exam   Cardiovascular: Regular rhythm and normal heart sounds.    No murmur heard.  Pulmonary/Chest: Effort normal and breath sounds normal. He has no wheezes. He has no rales.   Musculoskeletal: He exhibits no edema.   Neurological:   Monofilament intact bilaterally. Pulses R: 2+ and L: 2+. Intact to light touch in upper extremity/lower extremity.   Psychiatric: He has a normal mood and affect. His behavior is normal.         BP 122/70 mmHg   Pulse 92   Temp(Src) 97.9 ??F (36.6 ??C) (Oral)   Resp 16   Ht 5\' 11"  (1.803 m)   Wt 182 lb 6.4 oz (82.736 kg)   BMI 25.45 kg/m2   SpO2 98%      Assessment & Plan:  John Friedman was seen today for foot burn.  Diagnoses and all orders for this visit:    Numbness in feet-  new dx to me, chronic issue, reviewed w/ patient differential & reassured this does not sound like DM but will check initial labs below.  Reviewed due to only occurring 2 days out of the week I favor it is due to position he is in (crossing his legs vs leaning over).  Will try to adjust position.  Orders:  -     HEMOGLOBIN A1C WITH EAG  -     VITAMIN B12    Chronic diarrhea- unchanged, reviewed previous w/u, favor this is likely related to gall bladder surgery & not infectious.  Continue to f/u with GI.  Reviewed red flags.         Follow-up Disposition: Not on File   Advised him to call back or return to office if symptoms worsen/change/persist.  Discussed expected course/resolution/complications of diagnosis in detail with patient.    Medication risks/benefits/costs/interactions/alternatives discussed with patient.  He was given an after visit summary which includes diagnoses, current medications, & vitals.   He expressed understanding with the diagnosis and plan.

## 2015-02-03 LAB — HEMOGLOBIN A1C WITH EAG
Estimated average glucose: 117 mg/dL
Hemoglobin A1c: 5.7 % — ABNORMAL HIGH (ref 4.8–5.6)

## 2015-02-03 LAB — VITAMIN B12: Vitamin B12: 327 pg/mL (ref 211–946)

## 2015-02-03 NOTE — Progress Notes (Signed)
Quick Note:        Letter sent to patient. All labs are stable or at goal for him. A1c elevated, 1st time, FBS & random BS always normal, unlikely to be significant, will monitor. B12 normal but lower limits, recommended to start supplement due to fatigue & numbness.    ______

## 2015-02-17 NOTE — Telephone Encounter (Signed)
Pt calling for lab slip for his A1C to be redone.  Dr told him it was a little hit and needed it redone.  Advise - [phone]

## 2015-02-17 NOTE — Telephone Encounter (Signed)
A1c is a 3 month BS average.  Can not repeat until end of September or October.

## 2015-02-18 NOTE — Telephone Encounter (Signed)
Pt informed not due for A1c at this time, insurance will not cover to have it any sooner than end of September beginning of October. Pt verbalized understand.

## 2015-04-16 DIAGNOSIS — G250 Essential tremor: Secondary | ICD-10-CM | POA: Insufficient documentation

## 2015-05-12 ENCOUNTER — Ambulatory Visit
Admit: 2015-05-12 | Discharge: 2015-05-12 | Payer: PRIVATE HEALTH INSURANCE | Attending: Internal Medicine | Primary: Internal Medicine

## 2015-05-12 DIAGNOSIS — K580 Irritable bowel syndrome with diarrhea: Secondary | ICD-10-CM | POA: Insufficient documentation

## 2015-05-12 NOTE — Progress Notes (Signed)
HPI:  John Friedman is a 49 y.o. year old male who returns to clinic today for routine follow up appointment to discuss the issues below:      He is here today to talk about neuropahy.  This is a chronic problem, symptoms have remained the same.  It is still intermittent.  He is here today to f/u on blood work.  Medical treatment has included: nothing.        He had multiple questions about IBS dx by GI.  Notes reviewed.  He has not started Bentyl.  Reviewed some intermittent pain but mostly diarrhea.  He is always "cold".  He is eating a lot of ice at work.      He was in [place] in the end of August.  Asking about [place].  Currently no symptoms.  Someone at work has been diagnosed w/ active TB.  Several employees have been diagnosed w/ TBI.  He was tested negative but [facility] will be back in a few weeks to retest according to him.  He is worried about all the stuff he has been exposed to in the last few years (mold, possibly Zika, possibly TB).         Prior to Admission medications    Medication Sig Start Date End Date Taking? Authorizing Provider   colestipol (COLESTID) 1 gram tablet  01/05/15  Yes Historical Provider   cholestyramine (QUESTRAN) 4 gram packet  07/24/14  Yes Historical Provider   armodafinil (NUVIGIL) 200 mg tab Take 1 Tab by mouth daily as needed.   Yes Historical Provider   propranolol (INDERAL) 20 mg tablet Take 20 mg by mouth as needed.   Yes Historical Provider   LYRICA 300 mg capsule TAKE ONE (1) CAPSULE BY MOUTH ONCE A DAY 06/26/13  Yes Antony Haste, MD   clonazepam [facility]) 1 mg tablet Take 1 mg by mouth two (2) times a day.   Yes Historical Provider   OTHER    Yes Historical Provider   clomiPRAMINE (ANAFRANIL) 50 mg capsule Take 3 Caps by mouth daily. 12/04/12  Yes Antony Haste, MD   primidone (MYSOLINE) 50 mg tablet  11/17/14   Historical Provider   dicyclomine (BENTYL) 10 mg capsule  01/28/15   Historical Provider          Allergies   Allergen Reactions   ??? Latex Other (comments)      redness           Review of Systems   Constitutional: Negative for malaise/fatigue and weight loss.   Gastrointestinal: Positive for abdominal pain (intermittent) and diarrhea. Negative for blood in stool, constipation, heartburn, melena, nausea and vomiting.   Neurological: Positive for tingling.         Physical Exam   Constitutional: No distress.   pale   Cardiovascular: Regular rhythm and normal heart sounds.    No murmur heard.  Pulmonary/Chest: Effort normal and breath sounds normal. He has no wheezes. He has no rales.   Abdominal: Soft. Bowel sounds are normal. He exhibits no mass. There is no hepatosplenomegaly. There is no tenderness.         Visit Vitals   ??? BP 94/68 (BP 1 Location: Left arm, BP Patient Position: Sitting)   ??? Pulse 96   ??? Temp 97.9 ??F (36.6 ??C) (Oral)   ??? Resp 16   ??? Ht 5\' 11"  (1.803 m)   ??? Wt 184 lb 6.4 oz (83.6 kg)   ??? SpO2 98%   ???  BMI 25.72 kg/m2         Assessment & Plan:  [place] was seen today for irritable bowel syndrome.  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  Reviewed expectations.  Reviewed no other obvious etiology at this time.    Subclinical hypothyroidism- overdue for labs  -     TSH 3RD GENERATION    Hypotension, unspecified hypotension type- low today, has been fluctuating, due to possible pica will check labs below.  -     CBC W/O DIFF  -     IRON PROFILE  -     FERRITIN    Elevated hemoglobin A1c- due for repeat labs, do not think neuropathy is related  -     HEMOGLOBIN A1C WITH EAG       Declined flu shot.      Follow-up Disposition:  Return if symptoms worsen or fail to improve.   Advised him to call back or return to office if symptoms worsen/change/persist.  Discussed expected course/resolution/complications of diagnosis in detail with patient.    Medication risks/benefits/costs/interactions/alternatives discussed with patient.   He was given an after visit summary which includes diagnoses, current medications, & vitals.  He expressed understanding with the diagnosis and plan.

## 2015-05-12 NOTE — Progress Notes (Signed)
Pt. Is here for follow up IBS and wants labs.

## 2015-05-19 LAB — CBC W/O DIFF
HCT: 41.2 % (ref 37.5–51.0)
HGB: 14.1 g/dL (ref 12.6–17.7)
MCH: 30.9 pg (ref 26.6–33.0)
MCHC: 34.2 g/dL (ref 31.5–35.7)
MCV: 90 fL (ref 79–97)
NRBC: 0 % (ref 0–0)
PLATELET: 152 10*3/uL (ref 150–379)
RBC: 4.57 x10E6/uL (ref 4.14–5.80)
RDW: 14.2 % (ref 12.3–15.4)
WBC: 2.8 10*3/uL — ABNORMAL LOW (ref 3.4–10.8)

## 2015-05-19 LAB — TSH 3RD GENERATION: TSH: 6.67 u[IU]/mL — ABNORMAL HIGH (ref 0.450–4.500)

## 2015-05-19 LAB — HEMOGLOBIN A1C WITH EAG
Estimated average glucose: 120 mg/dL
Hemoglobin A1c: 5.8 % — ABNORMAL HIGH (ref 4.8–5.6)

## 2015-05-19 LAB — IRON PROFILE
Iron % saturation: 19 % (ref 15–55)
Iron: 76 ug/dL (ref 38–169)
TIBC: 390 ug/dL (ref 250–450)
UIBC: 314 ug/dL (ref 111–343)

## 2015-05-19 LAB — FERRITIN: Ferritin: 33 ng/mL (ref 30–400)

## 2015-05-19 NOTE — Progress Notes (Signed)
Please call patient.  Iron labs & CBC stable, no anemia or iron deficiency.  A1c stable.  FBS always normal.  Not sure if this is truly pre-DM or issues w/ RBC.  No changes, will get fructosamine at next lab draw.  TSH stable.  It is high.  He is having some symptoms, so would offer him 62mcg daily, #30, RF 2.  Needs to take separate from all other meds.

## 2015-05-20 NOTE — Progress Notes (Signed)
Call to patient. Advised of Dr. Amedeo Plenty' note regarding labs and recommendations. He states he is not having any symptoms of fatigue, dry skin/nails, feeling cold, constipation, depression, weight gain. He does not want to start any thyroid medication at this time. Reports he had bad experience of heart racing when he tried a thyroid medication in the past. Patient verbalized understanding regarding Dr. Amedeo Plenty' note.

## 2015-05-20 NOTE — Progress Notes (Signed)
Call to patient. Advised of Dr. Amedeo Plenty' note regarding labs and recommendations. He is not having any symptoms of fatigue, dry skin/nails, weight gain, feeling cold, constipation, depression. Does not want to start any thyroid medication at this time. States he has tried it in the past and had a bad experience of heart racing when he took it. Patient verbalized understanding regarding Dr. Amedeo Plenty note.

## 2015-07-24 ENCOUNTER — Ambulatory Visit
Admit: 2015-07-24 | Discharge: 2015-07-24 | Payer: PRIVATE HEALTH INSURANCE | Attending: Internal Medicine | Primary: Internal Medicine

## 2015-07-24 DIAGNOSIS — Z0000 Encounter for general adult medical examination without abnormal findings: Secondary | ICD-10-CM

## 2015-07-24 NOTE — Progress Notes (Signed)
John Friedman is a 49 y.o. male who was seen in clinic today (07/24/2015).    Assessment & Plan:   John Friedman was seen today for complete physical.  Diagnoses and all orders for this visit:    Routine general medical examination at a health care facility  -     AMB POC EKG ROUTINE W/ 12 LEADS, INTER & REP  -     METABOLIC PANEL, COMPREHENSIVE  -     PROSTATE SPECIFIC AG    Chronic fatigue- stable, continue current treatment, stable, continue current treatment     Groin discomfort, right- this is a new problem, differential dx reviewed with the patient, unclear etiology but do not think it is related to cancer at this time.  He has f/u with oncologist in 2 wks so will defer to him.  Will rest & monitor.  Red flags were reviewed with the patient to RTC or notify me.     H/O testicular cancer    Subclinical hypothyroidism- reviewed labs, declined tx, will cont to monitor.    Thumb pain, right- this is a new problem, differential dx reviewed with the patient, sounds tendonitis related.  Will treat with rest & OTC wrist brace.  Red flags were reviewed with the patient to RTC or notify me, expected time course for resolution reviewed if he avoid repeated trauma & overuse.          Follow-up Disposition:  Return in about 1 year (around 07/23/2016) for FULL PHYSICAL - 30 minutes.        ------------------------------------------------------------------------------------------    Subjective:   Health Maintenance  Immunizations:     Influenza: patient declines, reviewed pros/cons to vaccination & recommended it.  Tetanus: patient declines.  Shingles: he declined.  Pneumonia: n/a.    Cancer screening:    Prostate: reviewed guidelines, declined DRE will check PSA.  Colon: up to date.      Patient Care Team:  Antony Haste, MD as PCP - General (Internal Medicine)  Pat Patrick, MD (Gastroenterology)  Ronald Lobo, MD (Cardiology)  Danella Deis, MD (Psychiatry)  Otilio Connors, MD (Neurology)   Sherran Needs, MD (Otolaryngology)  Lorelee New, MD (Allergy)  Annie Paras, MD (Hematology and Oncology)  Burney Gauze (Hematology and Oncology)  Aggie Moats (Internal Medicine)  [place] (Infectious Diseases)       The following sections were reviewed & updated as appropriate: PMH, PSH, FH, and SH.      Patient Active Problem List   Diagnosis Code   ??? Subclinical hypothyroidism E03.9   ??? Essential tremor G25.0   ??? Chronic fatigue R53.82   ??? GERD (gastroesophageal reflux disease) K21.9   ??? H/O testicular cancer Z85.47   ??? Sleep disturbances G47.9   ??? Lumbar radiculopathy M54.16   ??? Irritable bowel syndrome with diarrhea K58.0          Prior to Admission medications    Medication Sig Start Date End Date Taking? Authorizing Provider   OTHER VIP Nasal spray twice a day, mold exposure (vasoactive intestinal peptide).   Yes Historical Provider   cholestyramine (QUESTRAN) 4 gram packet  07/24/14  Yes Historical Provider   armodafinil (NUVIGIL) 200 mg tab Take 1 Tab by mouth daily as needed.   Yes Historical Provider   propranolol (INDERAL) 20 mg tablet Take 20 mg by mouth as needed.   Yes Historical Provider   LYRICA 300 mg capsule TAKE ONE (1) CAPSULE BY MOUTH ONCE A  DAY 06/26/13  Yes Antony Haste, MD   clonazepam (KLONOPIN) 1 mg tablet Take 1 mg by mouth two (2) times a day.   Yes Historical Provider   OTHER    Yes Historical Provider   clomiPRAMINE (ANAFRANIL) 50 mg capsule Take 3 Caps by mouth daily. 12/04/12  Yes Antony Haste, MD   primidone (MYSOLINE) 50 mg tablet  11/17/14   Historical Provider          Allergies   Allergen Reactions   ??? Latex Other (comments)     redness            Review of Systems   Constitutional: Positive for malaise/fatigue. Negative for chills and fever.   Respiratory: Negative for cough and shortness of breath.    Cardiovascular: Negative for chest pain and palpitations.   Gastrointestinal: Positive for diarrhea. Negative for abdominal pain,  blood in stool, heartburn, melena, nausea and vomiting.   Genitourinary:        Reports R side groin pain, on/off, present for 3 months, no trauma.  He denies: nocturia, urinary hesitancy, urinary frequency, weak stream.   Musculoskeletal: Positive for joint pain. Negative for myalgias.        R wrist, he reports injured 3 months ago.  He reports lifting his backpack using his thumb & it was bent backwards.  He reports pain severe, needs to release his grip.  Only w/ activity.  He has not tried anything.  Wants to see orthopedic surgery.   Neurological: Positive for tingling and tremors. Negative for focal weakness and headaches.   Endo/Heme/Allergies: Does not bruise/bleed easily.   Psychiatric/Behavioral: Negative for depression. The patient is not nervous/anxious and does not have insomnia.          Objective:   Physical Exam   Constitutional: No distress.   HENT:   Right Ear: Tympanic membrane is not erythematous and not bulging. No middle ear effusion.   Left Ear: Tympanic membrane is not erythematous and not bulging.  No middle ear effusion.   Nose: No mucosal edema or rhinorrhea. Right sinus exhibits no maxillary sinus tenderness and no frontal sinus tenderness. Left sinus exhibits no maxillary sinus tenderness and no frontal sinus tenderness.   Mouth/Throat: Uvula is midline and mucous membranes are normal. No oropharyngeal exudate or posterior oropharyngeal erythema.   Eyes: Conjunctivae are normal. No scleral icterus.   Neck: Neck supple.   Cardiovascular: Regular rhythm and normal heart sounds.    No murmur heard.  Pulmonary/Chest: Effort normal and breath sounds normal. He has no wheezes. He has no rales.   Abdominal: Soft. Bowel sounds are normal. He exhibits no mass. There is no hepatosplenomegaly. There is no tenderness. Hernia confirmed negative in the right inguinal area and confirmed negative in the left inguinal area.   Genitourinary: Right testis shows no swelling and no tenderness. Left  testis shows no swelling and no tenderness. Circumcised.   Genitourinary Comments: Could not palpate R testicle.  L synthetic testicle palpated   Musculoskeletal:        Right wrist: He exhibits tenderness (lateral & ventral aspect). He exhibits normal range of motion, no bony tenderness and no deformity.   Lymphadenopathy:     He has no cervical adenopathy.        Right: No inguinal adenopathy present.        Left: No inguinal adenopathy present.   Neurological: He displays tremor.   Skin: No rash noted.  Visit Vitals   ??? BP 94/70   ??? Pulse 68   ??? Temp 97.6 ??F (36.4 ??C) (Oral)   ??? Resp 20   ??? Ht 5' 10.9" (1.801 m)   ??? Wt 182 lb 12.8 oz (82.9 kg)   ??? SpO2 95%   ??? BMI 25.57 kg/m2          Advised him to call back or return to office if symptoms worsen/change/persist.  Discussed expected course/resolution/complications of diagnosis in detail with patient.    Medication risks/benefits/costs/interactions/alternatives discussed with patient.  He was given an after visit summary which includes diagnoses, current medications, & vitals.  He expressed understanding with the diagnosis and plan.        Antony Haste, MD

## 2015-07-24 NOTE — Progress Notes (Signed)
CPE

## 2015-07-24 NOTE — Patient Instructions (Signed)
Well Visit, Men 50 to 65: Care Instructions  Your Care Instructions  Physical exams can help you stay healthy. Your doctor has checked your overall health and may have suggested ways to take good care of yourself. He or she also may have recommended tests. At home, you can help prevent illness with healthy eating, regular exercise, and other steps.  Follow-up care is a key part of your treatment and safety. Be sure to make and go to all appointments, and call your doctor if you are having problems. It's also a good idea to know your test results and keep a list of the medicines you take.  How can you care for yourself at home?  ?? Reach and stay at a healthy weight. This will lower your risk for many problems, such as obesity, diabetes, heart disease, and high blood pressure.  ?? Get at least 30 minutes of exercise on most days of the week. Walking is a good choice. You also may want to do other activities, such as running, swimming, cycling, or playing tennis or team sports.  ?? Do not smoke. Smoking can make health problems worse. If you need help quitting, talk to your doctor about stop-smoking programs and medicines. These can increase your chances of quitting for good.  ?? Always wear sunscreen on exposed skin. Make sure to use a broad-spectrum sunscreen that has a sun protection factor (SPF) of 30 or higher. Use it every day, even when it is cloudy.  ?? See a dentist one or two times a year for checkups and to have your teeth cleaned.  ?? Wear a seat belt in the car.  ?? Limit alcohol to 2 drinks a day. Too much alcohol can cause health problems.  Follow your doctor's advice about when to have certain tests. These tests can spot problems early.  ?? Cholesterol. Your doctor will tell you how often to have this done based on your overall health and other things that can increase your risk for heart attack and stroke.  ?? Blood pressure. Have your blood pressure checked during a routine doctor  visit. Your doctor will tell you how often to check your blood pressure based on your age, your blood pressure results, and other factors.  ?? Prostate exam. Talk to your doctor about whether you should have a blood test (called a PSA test) for prostate cancer. Experts disagree on whether men should have this test. Some experts recommend that you discuss the benefits and risks of the test with your doctor.  ?? Diabetes. Ask your doctor whether you should have tests for diabetes.  ?? Vision. Some experts recommend that you have yearly exams for glaucoma and other age-related eye problems starting at age 50.  ?? Hearing. Tell your doctor if you notice any change in your hearing. You can have tests to find out how well you hear.  ?? Colon cancer. You should begin tests for colon cancer at age 50. You may have one of several tests. Your doctor will tell you how often to have tests based on your age and risk. Risks include whether you already had a precancerous polyp removed from your colon or whether your parent, brother, sister, or child has had colon cancer.  ?? Heart attack and stroke risk. At least every 4 to 6 years, you should have your risk for heart attack and stroke assessed. Your doctor uses factors such as your age, blood pressure, cholesterol, and whether you smoke or have diabetes to show   what your risk for a heart attack or stroke is over the next 10 years.  ?? Abdominal aortic aneurysm. Ask your doctor whether you should have a test to check for an aneurysm. You may need a test if you ever smoked or if your parent, brother, sister, or child has had an aneurysm.  When should you call for help?  Watch closely for changes in your health, and be sure to contact your doctor if you have any problems or symptoms that concern you.  Where can you learn more?  Go to http://www.healthwise.net/[employer]  Enter K916 in the search box to learn more about "Well Visit, Men 50 to 65: Care Instructions."   ?? 2006-2016 [employer]. Care instructions adapted under license by [employer] (which disclaims liability or warranty for this information). This care instruction is for use with your licensed healthcare professional. If you have questions about a medical condition or this instruction, always ask your healthcare professional. [employer] Inc[employer] disclaims any warranty or liability for your use of this information.  Content Version: 11.0.578772; Current as of: December 10, 2014

## 2015-08-05 ENCOUNTER — Other Ambulatory Visit: Payer: Self-pay

## 2015-08-05 DIAGNOSIS — Z8547 Personal history of malignant neoplasm of testis: Secondary | ICD-10-CM

## 2015-08-06 ENCOUNTER — Ambulatory Visit (HOSPITAL_BASED_OUTPATIENT_CLINIC_OR_DEPARTMENT_OTHER): Payer: Managed Care, Other (non HMO) | Admitting: Hematology & Oncology

## 2015-08-06 ENCOUNTER — Other Ambulatory Visit (HOSPITAL_BASED_OUTPATIENT_CLINIC_OR_DEPARTMENT_OTHER): Payer: Managed Care, Other (non HMO)

## 2015-08-06 VITALS — BP 105/73 | HR 89 | Temp 97.3°F | Resp 18 | Wt 178.0 lb

## 2015-08-06 DIAGNOSIS — Z8547 Personal history of malignant neoplasm of testis: Secondary | ICD-10-CM

## 2015-08-06 LAB — CMP (CANCER CENTER ONLY)
ALBUMIN: 3.9 g/dL (ref 3.3–5.5)
ALK PHOS: 89 U/L — AB (ref 26–84)
ALT(SGPT): 35 U/L (ref 10–47)
AST: 37 U/L (ref 11–38)
BUN, Bld: 11 mg/dL (ref 7–22)
CALCIUM: 9.1 mg/dL (ref 8.0–10.3)
CO2: 25 meq/L (ref 18–33)
Chloride: 103 mEq/L (ref 98–108)
Creat: 1.1 mg/dl (ref 0.6–1.2)
Glucose, Bld: 81 mg/dL (ref 73–118)
POTASSIUM: 4.1 meq/L (ref 3.3–4.7)
Sodium: 141 mEq/L (ref 128–145)
Total Bilirubin: 0.7 mg/dl (ref 0.20–1.60)
Total Protein: 6.9 g/dL (ref 6.4–8.1)

## 2015-08-06 LAB — CBC WITH DIFFERENTIAL (CANCER CENTER ONLY)
BASO#: 0 10*3/uL (ref 0.0–0.2)
BASO%: 0.3 % (ref 0.0–2.0)
EOS ABS: 0 10*3/uL (ref 0.0–0.5)
EOS%: 1 % (ref 0.0–7.0)
HEMATOCRIT: 43.1 % (ref 38.7–49.9)
HEMOGLOBIN: 14.5 g/dL (ref 13.0–17.1)
LYMPH#: 1 10*3/uL (ref 0.9–3.3)
LYMPH%: 34 % (ref 14.0–48.0)
MCH: 30.3 pg (ref 28.0–33.4)
MCHC: 33.6 g/dL (ref 32.0–35.9)
MCV: 90 fL (ref 82–98)
MONO#: 0.5 10*3/uL (ref 0.1–0.9)
MONO%: 17 % — ABNORMAL HIGH (ref 0.0–13.0)
NEUT%: 47.7 % (ref 40.0–80.0)
NEUTROS ABS: 1.4 10*3/uL — AB (ref 1.5–6.5)
Platelets: 154 10*3/uL (ref 145–400)
RBC: 4.79 10*6/uL (ref 4.20–5.70)
RDW: 13.2 % (ref 11.1–15.7)
WBC: 2.9 10*3/uL — AB (ref 4.0–10.0)

## 2015-08-06 NOTE — Progress Notes (Signed)
Hematology and Oncology Follow Up Visit  Charles Lowe [id number] 03/25/1966 49 y.o. 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  Current Therapy:    Observation     Interim History:  [name] is back for followup. We last saw him back in May. He has had a good summer. 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   He is still bothered by the chronic fatigue syndrome. He is seen multiple specialists. He thinks part of the issue might be mold exposure. He is on medication to try to help this.  He has not had any problems with respect to his testicular cancer coming back. It now has been over 12 years that he's been treated.  His last tumor markers neck in May showed an alpha-fetoprotein of 6.6 and a beta hCG of less than 2. His LDH was normal.  He's had no problems with weight loss or weight gain. His appetite has been good. He has had no cough or shortness of breath.  There has been no rashes. He's had no fever. He's had no joint issues.  Overall, his performance status is ECOG 1.  Medications:  Current outpatient prescriptions:  .  clomiPRAMINE (ANAFRANIL) 50 MG capsule, Take 150 mg by mouth at bedtime., Disp: , Rfl:  .  clonazePAM (KLONOPIN) 0.5 MG tablet, Take 0.5 mg by mouth 2 (two) times daily as needed. For anxiety, Disp: , Rfl:  .  pregabalin (LYRICA) 300 MG capsule, Take 300 mg by mouth at bedtime. , Disp: , Rfl:  .  propranolol (INDERAL) 20 MG tablet, Take 20 mg by mouth as needed. For tremors, Disp: , Rfl:   Allergies:  Allergies  Allergen Reactions  . Latex     CAUSES SKIN REDNESS    Past Medical History, Surgical history, Social history, and Family History were reviewed and updated.  Review of Systems: As above  Physical Exam:  weight is 178 lb (80.74 kg). His oral temperature is 97.3 F  (36.3 C). His blood pressure is 105/73 and his pulse is 89. His respiration is 18.   Pale white gentleman in no obvious distress. Head and neck exam shows no ocular or oral lesions. He has no scleral icterus. He has no adenopathy in his neck. Thyroid is nonpalpable. Lungs are clear bilaterally. Cardiac exam shows a regular rate and rhythm with no murmurs, rubs or bruits. Abdomen is soft. He has good bowel sounds. He has well-healed left inguinal orchiectomy scar. There is no palpable liver or spleen. Back exam shows no tenderness over the spine ribs or hips. Extremities shows no clubbing cyanosis or edema. Skin exam shows no rashes ecchymoses or petechia. Neurological exam is nonfocal.  Lab Results  Component Value Date   WBC 2.9* 08/06/2015   HGB 14.5 08/06/2015   HCT 43.1 08/06/2015   MCV 90 08/06/2015   PLT 154 08/06/2015     Chemistry      Component Value Date/Time   NA 141 08/06/2015 1503   NA 138 01/02/2014 0843   K 4.1 08/06/2015 1503   K 4.1 01/02/2014 0843   CL 103 08/06/2015 1503   CL 103 01/02/2014 0843   CO2 25 08/06/2015 1503   CO2 25 01/02/2014 0843   BUN 11 08/06/2015 1503   BUN 13 01/02/2014 0843   CREATININE 1.1  08/06/2015 1503   CREATININE 1.02 01/02/2014 0843      Component Value Date/Time   CALCIUM 9.1 08/06/2015 1503   CALCIUM 8.8 01/02/2014 0843   ALKPHOS 89* 08/06/2015 1503   ALKPHOS 123* 01/02/2014 0843   AST 37 08/06/2015 1503   AST 20 01/02/2014 0843   ALT 35 08/06/2015 1503   ALT 21 01/02/2014 0843   BILITOT 0.70 08/06/2015 1503   BILITOT 0.4 01/02/2014 0843         Impression and Plan: Charles Lowe is 49 year old male with a past history of stage III non- seminomatous germ cell  tumor of the left testicle. He did receive systemic chemotherapy with BEP for 3 cycles. He completed this back in 2001.  I do not see any evidence of recurrence.  His biggest problem is the chronic fatigue which he is dealing with.  We will plan to get him back  in 6 more months. He lives out of town in [place]. He is down here for the holidays.  It sounds like he'll be working quite a bit as [employer] he works for will be overtaken by another firm.   [name], MD 12/22/20165:19 PM

## 2015-08-07 LAB — LACTATE DEHYDROGENASE: LDH: 185 U/L (ref 125–245)

## 2015-08-12 LAB — BETA HCG QUANT (REF LAB)

## 2015-08-12 LAB — AFP TUMOR MARKER: AFP-Tumor Marker: 6 ng/mL (ref <6.1)

## 2015-08-18 ENCOUNTER — Ambulatory Visit: Admit: 2015-08-18 | Payer: PRIVATE HEALTH INSURANCE | Attending: Internal Medicine | Primary: Internal Medicine

## 2015-08-18 DIAGNOSIS — S90424A Blister (nonthermal), right lesser toe(s), initial encounter: Secondary | ICD-10-CM

## 2015-08-18 NOTE — Progress Notes (Signed)
John Friedman is a 50 y.o. male who was seen in clinic today (08/18/2015).    Assessment & Plan:  John Friedman was seen today for other.  Diagnoses and all orders for this visit:    Blister of toe, right, initial encounter- new dx, reviewed differential, does not look infectious, likely from rubbing.  Will cont to monitor.  Will keep covered w/ bandage.  Red flags and expectations were reviewed & discussed with the him.  He verbalized understanding.           Follow-up Disposition:  Return if symptoms worsen or fail to improve.       ----------------------------------------------------------------------    Subjective:  Dermatology Review  He is here to talk about cyst.  He noticed it this morning, with no changes since that time.  Location: R 3rd toe, base of the nail.  Symptoms include blister.  He denies any pain or trauma.  Treatment to date has included nothing.       Prior to Admission medications    Medication Sig Start Date End Date Taking? Authorizing Provider   OTHER VIP Nasal spray twice a day, mold exposure (vasoactive intestinal peptide).   Yes Historical Provider   cholestyramine (QUESTRAN) 4 gram packet  07/24/14  Yes Historical Provider   armodafinil (NUVIGIL) 200 mg tab Take 1 Tab by mouth daily as needed.   Yes Historical Provider   propranolol (INDERAL) 20 mg tablet Take 20 mg by mouth as needed.   Yes Historical Provider   LYRICA 300 mg capsule TAKE ONE (1) CAPSULE BY MOUTH ONCE A DAY 06/26/13  Yes Antony Haste, MD   clonazepam [facility]) 1 mg tablet Take 1 mg by mouth two (2) times a day.   Yes Historical Provider   OTHER    Yes Historical Provider   clomiPRAMINE (ANAFRANIL) 50 mg capsule Take 3 Caps by mouth daily. 12/04/12  Yes Antony Haste, MD   primidone (MYSOLINE) 50 mg tablet  11/17/14   Historical Provider          Allergies   Allergen Reactions   ??? Latex Other (comments)     redness           ROS- no fevers or chills      Objective:   Physical Exam   Constitutional: No distress.    Skin: No rash noted. No erythema.   R 3rd toe, base of the nail there is a 49mm bullae, no surrounding erythema or other lesions.           Visit Vitals   ??? BP 116/84   ??? Pulse 88   ??? Temp 97.7 ??F (36.5 ??C) (Oral)   ??? Resp 12   ??? Ht 5' 10.9" (1.801 m)   ??? Wt 183 lb (83 kg)   ??? SpO2 96%   ??? BMI 25.6 kg/m2         Disclaimer:  Advised him to call back or return to office if symptoms worsen/change/persist.  Discussed expected course/resolution/complications of diagnosis in detail with patient.    Medication risks/benefits/costs/interactions/alternatives discussed with patient.  He was given an after visit summary which includes diagnoses, current medications, & vitals.  He expressed understanding with the diagnosis and plan.        Antony Haste, MD

## 2015-08-18 NOTE — Patient Instructions (Signed)
Bullae - blister - vesicle

## 2015-08-18 NOTE — Progress Notes (Signed)
Noticed a growth on his right, middle toe this morning. Recently seen by oncologist in [place], Dr. Burney Gauze.

## 2015-08-31 ENCOUNTER — Telehealth

## 2015-08-31 NOTE — Telephone Encounter (Signed)
Would recommend podiatrist.  Dr Raliegh Ip or Renne Crigler.

## 2015-08-31 NOTE — Telephone Encounter (Signed)
Called pt and gave name of podiatrist. Pt verbalized understanding.

## 2015-08-31 NOTE — Telephone Encounter (Signed)
Patient still has issues with rt foot blister, has not changed at all, doesn't hurt

## 2015-09-01 ENCOUNTER — Emergency Department: Admit: 2015-09-01 | Payer: PRIVATE HEALTH INSURANCE | Primary: Internal Medicine

## 2015-09-01 ENCOUNTER — Inpatient Hospital Stay
Admit: 2015-09-01 | Discharge: 2015-09-01 | Disposition: A | Discharge status: Home / Self Care | Payer: PRIVATE HEALTH INSURANCE | Attending: Student in an Organized Health Care Education/Training Program

## 2015-09-01 DIAGNOSIS — R079 Chest pain, unspecified: Secondary | ICD-10-CM

## 2015-09-01 LAB — CBC WITH AUTOMATED DIFF
ABS. BASOPHILS: 0 10*3/uL (ref 0.0–0.1)
ABS. EOSINOPHILS: 0.1 10*3/uL (ref 0.0–0.4)
ABS. LYMPHOCYTES: 1.6 10*3/uL (ref 0.8–3.5)
ABS. MONOCYTES: 0.4 10*3/uL (ref 0.0–1.0)
ABS. NEUTROPHILS: 2.7 10*3/uL (ref 1.8–8.0)
BASOPHILS: 0 % (ref 0–1)
EOSINOPHILS: 2 % (ref 0–7)
HCT: 42.3 % (ref 36.6–50.3)
HGB: 14.9 g/dL (ref 12.1–17.0)
LYMPHOCYTES: 33 % (ref 12–49)
MCH: 31.1 PG (ref 26.0–34.0)
MCHC: 35.2 g/dL (ref 30.0–36.5)
MCV: 88.3 FL (ref 80.0–99.0)
MONOCYTES: 9 % (ref 5–13)
NEUTROPHILS: 56 % (ref 32–75)
PLATELET: 155 10*3/uL (ref 150–400)
RBC: 4.79 M/uL (ref 4.10–5.70)
RDW: 13 % (ref 11.5–14.5)
WBC: 4.9 10*3/uL (ref 4.1–11.1)

## 2015-09-01 LAB — METABOLIC PANEL, COMPREHENSIVE
A-G Ratio: 2 (ref 1.1–2.5)
A-G Ratio: 1.4 (ref 1.1–2.2)
ALT (SGPT): 51 IU/L — ABNORMAL HIGH (ref 0–44)
ALT (SGPT): 53 U/L (ref 12–78)
AST (SGOT): 38 IU/L (ref 0–40)
AST (SGOT): 33 U/L (ref 15–37)
Albumin: 4.7 g/dL (ref 3.5–5.5)
Albumin: 4.5 g/dL (ref 3.5–5.0)
Alk. phosphatase: 134 IU/L — ABNORMAL HIGH (ref 39–117)
Alk. phosphatase: 144 U/L — ABNORMAL HIGH (ref 45–117)
Anion gap: 10 mmol/L (ref 5–15)
BUN/Creatinine ratio: 12 (ref 9–20)
BUN/Creatinine ratio: 18 (ref 12–20)
BUN: 12 mg/dL (ref 6–24)
BUN: 15 MG/DL (ref 6–20)
Bilirubin, total: 0.4 mg/dL (ref 0.0–1.2)
Bilirubin, total: 0.4 MG/DL (ref 0.2–1.0)
CO2: 24 mmol/L (ref 18–29)
CO2: 25 mmol/L (ref 21–32)
Calcium: 9.5 mg/dL (ref 8.7–10.2)
Calcium: 9.5 MG/DL (ref 8.5–10.1)
Chloride: 99 mmol/L (ref 96–106)
Chloride: 100 mmol/L (ref 97–108)
Creatinine: 0.97 mg/dL (ref 0.76–1.27)
Creatinine: 0.85 MG/DL (ref 0.70–1.30)
GFR est AA: 104 mL/min/{1.73_m2} (ref >59)
GFR est AA: >60 mL/min/{1.73_m2} (ref >60)
GFR est non-AA: 90 mL/min/{1.73_m2} (ref >59)
GFR est non-AA: >60 mL/min/{1.73_m2} (ref >60)
GLOBULIN, TOTAL: 2.3 g/dL (ref 1.5–4.5)
Globulin: 3.2 g/dL (ref 2.0–4.0)
Glucose: 88 mg/dL (ref 65–99)
Glucose: 81 mg/dL (ref 65–100)
Potassium: 4.6 mmol/L (ref 3.5–5.2)
Potassium: 4 mmol/L (ref 3.5–5.1)
Protein, total: 7 g/dL (ref 6.0–8.5)
Protein, total: 7.7 g/dL (ref 6.4–8.2)
Sodium: 140 mmol/L (ref 134–144)
Sodium: 135 mmol/L — ABNORMAL LOW (ref 136–145)

## 2015-09-01 LAB — TROPONIN I: Troponin-I, Qt.: <0.04 ng/mL (ref <0.05)

## 2015-09-01 LAB — CK W/ REFLX CKMB: CK: 183 U/L (ref 39–308)

## 2015-09-01 LAB — PSA, DIAGNOSTIC (PROSTATE SPECIFIC AG): Prostate Specific Ag: 0.7 ng/mL (ref 0.0–4.0)

## 2015-09-01 NOTE — ED Triage Notes (Addendum)
Pt stated he has had intermittent left sided chest pain radiating into left arm, last for hours, denies sob, feels weak, denies fever, denies n/v, denies diaphoresis, pt rambling in triage................Marland Kitchenpt talking about mold exposure, chronic inflammatory responses syndrome, pt now talking about his family history

## 2015-09-01 NOTE — Telephone Encounter (Signed)
Please call patient.  All labs are stable or at goal except for elevated alk phos & ALT (liver tests).  Likely med induced.  Would recommend getting abd [place] (limited to RUQ) to verify nothing concerning (dx: elevated LFTs).  PSA normal

## 2015-09-01 NOTE — ED Provider Notes (Signed)
HPI Comments: 50 y.o. male with past medical history significant for sleep disturbances, chronic fatigue, hypothyroid, essential tremor, GERD, IBS, testicular CA, and is s/p orchiectomy and cholecystectomy who presents from home via private vehicle with chief complaint of chest pain. Pt reports intermittent CP for the last 4 days that he describes as a "pressure". First onset was 4 days ago when he was resting and was attributed to sleeping position, mild in severity. Yesterday, pt had recurrence of the CP when resting after he had been cleaning his apartment that was on both sides of his chest and radiated into his left arm. This pain lasted a few hours and was present when he went to sleep but was gone when he woke up this morning. Today, the pt had onset of CP ~6 hours ago when sitting at his desk at work, lasting 2 - 3 hours before resolving on its own, 3/10 at the worst - pt is currently pain free. Pain was not pleuritic. Pt reports associated weakness, fatigue, and light headedness (which he also notes is somewhat chronic). This fatigue is different and more severe than his normal h/o chronic fatigue. Pt has no h/o DM, HTN, hypercholesterolemia, and is not a smoker. No family history of heart disease. Pt has no h/o DVT/PE and has not had any recent surgery or long seated travel. Pt notes that he had a normal stress test 4 years ago but does have a h/o diastolic dysfunction. He had blood work done by oncologist a few years ago (testicular CA 15 years ago) and was told that all of his blood levels were low. Pt additionally notes a "bad mold exposure" 2 years ago.  Pt denies all of the following: N/V/D, LOC, SOB, fever, cough, hemoptysis, abdominal pain, GERD sx, leg swelling.  There are no other acute medical concerns at this time.    Social hx:  Never smoker. Rare alcohol use.     PCP: Antony Haste, MD    Note written by Laqueta Jean, Scribe, as dictated by Bonney Leitz, PA 5:15 PM     The history is provided by the patient and medical records.        Past Medical History:   Diagnosis Date   ??? Cancer ([place])      left testicle- stage III, nonseminomatous germ cell tumor, s/p orchiectomy & BEP x 3 cycles   ??? Chronic fatigue January 1987   ??? Essential tremor    ??? GERD (gastroesophageal reflux disease)    ??? Hypothyroid    ??? 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 Normal cardiac stress test 09/05/11   ??? Sleep disturbances      acting out in his sleep       Past Surgical History:   Procedure Laterality Date   ??? Hx orchiectomy Left 2002   ??? Hx orthopaedic       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56   ??? Hx gi  09/09/11     EGD- normal   ??? Hx colonoscopy  07/12/10     hyperplastic polyp, repeat 10 yrs   ??? Hx cholecystectomy  12/05/11   ??? Hx heent Left 06/2012     tonsilar bx: benign tissue (Dr Buddy Duty)   ??? Hx colonoscopy  02/23/15     normal         Family History:   Problem Relation Age of Onset   ??? No Known Problems Mother    ??? Parkinsonism Father    ???  Arthritis-osteo Father      scoliosis   ??? No Known Problems Brother    ??? No Known Problems Brother    ??? No Known Problems Sister    ??? Cancer Maternal Grandfather      prostate   ??? Cancer Paternal Grandfather      prostate       Social History     Social History   ??? Marital status: SINGLE     Spouse name: N/A   ??? Number of children: N/A   ??? Years of education: N/A     Occupational History   ??? Not on file.     Social History Main Topics   ??? Smoking status: Never Smoker   ??? Smokeless tobacco: Never Used   ??? Alcohol use Yes      Comment: rarely   ??? Drug use: No   ??? Sexual activity: No     Other Topics Concern   ??? Not on file     Social History Narrative         ALLERGIES: Latex    Review of Systems   Constitutional: Positive for fatigue. Negative for fever.   HENT: Negative for congestion.    Eyes: Negative for visual disturbance.   Respiratory: Negative for cough and shortness of breath.    Cardiovascular: Positive for chest pain. Negative for leg swelling.    Gastrointestinal: Negative for abdominal pain, diarrhea, nausea and vomiting.   Musculoskeletal: Negative for neck stiffness.   Skin: Negative for wound.   Neurological: Positive for weakness and light-headedness. Negative for syncope.   All other systems reviewed and are negative.      Vitals:    09/01/15 1445   BP: 131/86   Pulse: 94   Resp: 16   Temp: 97.9 ??F (36.6 ??C)   SpO2: 100%   Weight: 85.5 kg (188 lb 8 oz)   Height: 5\' 11"  (1.803 m)            Physical Exam   Constitutional: He is oriented to person, place, and time. He appears well-developed and well-nourished. No distress.   Pleasant WM, talkative   HENT:   Head: Normocephalic and atraumatic.   Right Ear: External ear normal.   Left Ear: External ear normal.   Eyes: Conjunctivae are normal. No scleral icterus.   Neck: Neck supple. No tracheal deviation present.   Cardiovascular: Normal rate, regular rhythm and normal heart sounds.  Exam reveals no gallop and no friction rub.    No murmur heard.  Pulmonary/Chest: Effort normal and breath sounds normal. No stridor. No respiratory distress. He has no wheezes.   Abdominal: Soft. He exhibits no distension. There is no tenderness. There is no rebound.   Musculoskeletal: Normal range of motion. He exhibits no edema.   No calf tenderness   Neurological: He is alert and oriented to person, place, and time.   Skin: Skin is warm and dry.   Psychiatric: He has a normal mood and affect. His behavior is normal.   Nursing note and vitals reviewed.       MDM  Number of Diagnoses or Management Options  Atypical chest pain:   Prolonged QT interval:   Diagnosis management comments: 50 year old male presenting to the ED for a couple of days of intermittent, non-exertional CP.  No SOB.  Low risk.  Chronic fatigue and lightheadedness.  No VTE risk factors.  Nonischemic EKG with prolonged QT.  Negative troponin.  Pt had negative  stress test 4 years ago, encouraged f/u with PCP and cardiology, also discussed  avoidance of medications that could potentially further lengthen the QT interval.       Amount and/or Complexity of Data Reviewed  Clinical lab tests: ordered and reviewed  Tests in the radiology section of CPT??: ordered and reviewed  Discuss the patient with other providers: yes (Dr. Ouida Sills, ED attending)  Independent visualization of images, tracings, or specimens: yes (EKG)      ED Course       Procedures

## 2015-09-02 ENCOUNTER — Telehealth

## 2015-09-02 ENCOUNTER — Encounter: Attending: Internal Medicine | Primary: Internal Medicine

## 2015-09-02 LAB — EKG, 12 LEAD, INITIAL
Atrial Rate: 89 {beats}/min
Calculated P Axis: 62 degrees
Calculated R Axis: 39 degrees
Calculated T Axis: 71 degrees
Diagnosis: NORMAL
P-R Interval: 158 ms
Q-T Interval: 608 ms
QRS Duration: 84 ms
QTC Calculation (Bezet): 739 ms
Ventricular Rate: 89 {beats}/min

## 2015-09-02 NOTE — Telephone Encounter (Signed)
Spoke with patient regarding lab results ,patient has appointment in the morning will have slip waiting for him regarding [place] due to elevated lft.

## 2015-09-03 ENCOUNTER — Ambulatory Visit
Admit: 2015-09-03 | Discharge: 2015-09-03 | Payer: PRIVATE HEALTH INSURANCE | Attending: Internal Medicine | Primary: Internal Medicine

## 2015-09-03 DIAGNOSIS — R0789 Other chest pain: Secondary | ICD-10-CM

## 2015-09-03 NOTE — Progress Notes (Signed)
ED follow up.

## 2015-09-03 NOTE — Progress Notes (Signed)
John Friedman is a 50 y.o. male who was seen in clinic today (09/03/2015).    Assessment & Plan:  John Friedman was seen today for Friedman follow up.  Diagnoses and all orders for this visit:    Chest pain, atypical- differential dx reviewed with the patient, sounds muscular or possibly inflammation related.  Do not think pulm or cardiac or GI.  reviewed previous cardiac w/u in the past.  Will treat with rest & NSAIDs.  Red flags were reviewed with the patient to RTC or notify me, expected time course for resolution reviewed.     Abnormal EKG- previously elevated QRS interval has resolved, reviewed meds to monitor for  -     AMB POC EKG ROUTINE W/ 12 LEADS, INTER & REP    Elevated alkaline phosphatase level- chronic, has had US done by GI in the last year, will get this for review, will check labs  -     ALK PHOS ISOENZYMES    Overweight (BMI 25.0-29.9)         Follow-up Disposition:  Return if symptoms worsen or fail to improve.       ----------------------------------------------------------------------    Subjective:  John Friedman is seen for follow up from recent ED visit to John Friedman. John Friedman's on 08/31/14.  We reviewed the the records.  He presented with chest pain.  He reports on retrospect he did clean his apartment on the weekend and there is a some question about inflammation from mold.  He did d/w his mold specialist and reports this is plausible. He reports symptoms are improved, but have not resolved.  Still having 1-2 episodes, but less frequent & less intense.         Prior to Admission medications    Medication Sig Start Date End Date Taking? Authorizing Provider   OTHER VIP Nasal spray twice a day, mold exposure (vasoactive intestinal peptide).   Yes Historical Provider   cholestyramine (QUESTRAN) 4 gram packet  07/24/14  Yes Historical Provider   armodafinil (NUVIGIL) 200 mg tab Take 1 Tab by mouth daily as needed.   Yes Historical Provider    propranolol (INDERAL) 20 mg tablet Take 20 mg by mouth as needed.   Yes Historical Provider   LYRICA 300 mg capsule TAKE ONE (1) CAPSULE BY MOUTH ONCE A DAY 06/26/13  Yes John Haste, MD   clonazepam [facility]) 1 mg tablet Take 1 mg by mouth two (2) times a day.   Yes Historical Provider   OTHER    Yes Historical Provider   clomiPRAMINE (ANAFRANIL) 50 mg capsule Take 3 Caps by mouth daily. 12/04/12  Yes John Haste, MD   primidone (MYSOLINE) 50 mg tablet  11/17/14   Historical Provider          Allergies   Allergen Reactions   ??? Latex Other (comments)     redness           Review of Systems   Constitutional: Negative for chills, fever and weight loss.   Respiratory: Negative for cough and shortness of breath.    Cardiovascular: Positive for chest pain. Negative for palpitations.   Gastrointestinal: Negative for abdominal pain, constipation, diarrhea, nausea and vomiting.         Objective:   Physical Exam   Cardiovascular: Regular rhythm and normal heart sounds.    No murmur heard.  Pulmonary/Chest: Effort normal and breath sounds normal. He has no wheezes. He has no  rales. He exhibits no tenderness.   Abdominal: Soft. Bowel sounds are normal. He exhibits no mass. There is no hepatosplenomegaly. There is no tenderness.         Visit Vitals   ??? BP 98/72   ??? Pulse 92   ??? Temp 97.7 ??F (36.5 ??C) (Oral)   ??? Resp 16   ??? Ht '5\' 11"'  (1.803 m)   ??? Wt 183 lb (83 kg)   ??? SpO2 99%   ??? BMI 25.52 kg/m2         Disclaimer:  Advised him to call back or return to office if symptoms worsen/change/persist.  Discussed expected course/resolution/complications of diagnosis in detail with patient.    Medication risks/benefits/costs/interactions/alternatives discussed with patient.  He was given an after visit summary which includes diagnoses, current medications, & vitals.  He expressed understanding with the diagnosis and plan.        John Haste, MD

## 2015-12-31 NOTE — Telephone Encounter (Signed)
Patient is having surgery and they need most recent office notes and labs.  Would like to have them tomorrow if possible.  Patient's surgery is next Wednesday.  Fax # [phone]

## 2015-12-31 NOTE — Telephone Encounter (Signed)
Pt was called and states that he has a visit with Dr Amedeo Plenty tomorrow am and he will discuss this with him at that time.

## 2016-01-01 ENCOUNTER — Ambulatory Visit
Admit: 2016-01-01 | Discharge: 2016-01-01 | Payer: PRIVATE HEALTH INSURANCE | Attending: Internal Medicine | Primary: Internal Medicine

## 2016-01-01 DIAGNOSIS — M6740 Ganglion, unspecified site: Secondary | ICD-10-CM

## 2016-01-01 NOTE — Progress Notes (Signed)
Preoperative clearance.  Procedure  Excision of ganglioncyst right 3rd toe.  DOS:  01/06/16.  Surgeon:  Dr. Cory Munch.  [phone].

## 2016-01-01 NOTE — Progress Notes (Signed)
HPI:  John Friedman is 50 y.o. male ([birth year]) who presents for preoperative evaluation.      Procedure/Surgery: R 3rd toe ganglion cyst removal   Date of Procedure/Surgery: 01/06/16   Surgeon: Dr Cory Munch  Hospital/Surgical Facility: [facility]  Primary Physician: Antony Haste, MD       Reason for surgery: removal of skin lesion      Latex Allergy: YES  No recent use of aspirin (ASA), NSAIDS or steroids  Tetanus up to date: last tetanus booster 7-8 years ago (per pt)  Anesthesia Complications: None  History of abnormal bleeding : None  History of Blood Transfusions: no  Health Care Directive or Living Will: no      EKG: EKG FINDINGS - 09/03/15- normal EKG, normal sinus rhythm, unchanged from previous tracings  CXR: n/a  Labs: reviewed labs from 09/01/15      After reviewing the patient's history & exam he is low risk for cardiac complications.  No contraindications to planned surgery       ---------------------------------------     Prior to Admission medications    Medication Sig Start Date End Date Taking? Authorizing Provider   OTHER VIP Nasal spray twice a day, mold exposure (vasoactive intestinal peptide).   Yes Historical Provider   cholestyramine (QUESTRAN) 4 gram packet Take 1 Packet by mouth three (3) times daily (with meals). 07/24/14  Yes Historical Provider   armodafinil (NUVIGIL) 200 mg tab Take 1 Tab by mouth daily as needed.   Yes Historical Provider   propranolol (INDERAL) 20 mg tablet Take 20 mg by mouth as needed.   Yes Historical Provider   LYRICA 300 mg capsule TAKE ONE (1) CAPSULE BY MOUTH ONCE A DAY 06/26/13  Yes Antony Haste, MD   clonazepam [facility]) 1 mg tablet Take 1 mg by mouth two (2) times a day.   Yes Historical Provider   OTHER    Yes Historical Provider   clomiPRAMINE (ANAFRANIL) 50 mg capsule Take 3 Caps by mouth daily. 12/04/12  Yes Antony Haste, MD        Allergies   Allergen Reactions   ??? Latex Other (comments)     redness           Patient Active Problem List    Diagnosis Date Noted   ??? 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 Subclinical hypothyroidism    ??? Essential tremor    ??? GERD (gastroesophageal reflux disease)    ??? H/O testicular cancer    ??? Sleep disturbances    ??? Chronic fatigue 08/15/1985       Past Medical History:   Diagnosis Date   ??? Cancer ([place])     left testicle- stage III, nonseminomatous germ cell tumor, s/p orchiectomy & BEP x 3 cycles   ??? Chronic fatigue January 1987   ??? Essential tremor    ??? GERD (gastroesophageal reflux disease)    ??? Hypothyroid    ??? 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 Lumbar radiculopathy 08/10/2012    S/p steroid injection by Dr Laverta Carmi 08/01/12    ??? Normal cardiac stress test 09/05/11   ??? Sleep disturbances     acting out in his sleep       Past Surgical History:   Procedure Laterality Date   ??? HX CHOLECYSTECTOMY  12/05/2011    abnormal HIDA scan (EF < 2%)   ??? HX COLONOSCOPY  07/12/10    hyperplastic polyp,  repeat 10 yrs   ??? HX COLONOSCOPY  02/23/15    normal   ??? HX GI  09/09/11    EGD- normal   ??? HX HEENT Left 06/2012    tonsilar bx: benign tissue (Dr Buddy Duty)   ??? HX ORCHIECTOMY Left 2002   ??? HX ORTHOPAEDIC      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63       Social History   Substance Use Topics   ??? Smoking status: Never Smoker   ??? Smokeless tobacco: Never Used   ??? Alcohol use Yes      Comment: rarely         --------------------------------------     Review of Systems   Constitutional: Negative for malaise/fatigue and weight loss.   Respiratory: Negative for cough and shortness of breath.    Cardiovascular: Negative for chest pain (previously seen for CP in 1/17, attributed to mold exposure, has not returned), palpitations and leg swelling.   Gastrointestinal: Negative for abdominal pain, constipation, diarrhea, heartburn, nausea and vomiting.   Genitourinary: Negative for frequency.   Musculoskeletal: Negative for joint pain and myalgias.   Skin: Negative for rash.    Neurological: Positive for tingling (chronic, on/off in hands & feet, no changes) and tremors. Negative for sensory change, focal weakness and headaches.   Psychiatric/Behavioral: Negative for depression. The patient is not nervous/anxious and does not have insomnia.          Physical Exam   Constitutional: No distress.   HENT:   Mouth/Throat: Mucous membranes are normal.   Eyes: Conjunctivae are normal. No scleral icterus.   Neck: Neck supple.   Cardiovascular: Regular rhythm and normal heart sounds.    No murmur heard.  Pulmonary/Chest: Effort normal and breath sounds normal. He has no wheezes. He has no rales.   Abdominal: Soft. Bowel sounds are normal. He exhibits no mass. There is no hepatosplenomegaly. There is no tenderness.   Musculoskeletal: He exhibits no edema.   R 3rd toe, adjacent to nail there is a 1cm erythematous raised area w/ a central scab, no fluctuance    Lymphadenopathy:     He has no cervical adenopathy.   Neurological: He has normal strength. No cranial nerve deficit or sensory deficit.   Tremor, variable, bilateral UE   Skin: No rash noted.   Psychiatric: He has a normal mood and affect. His behavior is normal.          Visit Vitals   ??? BP 96/68   ??? Pulse 88   ??? Temp 98.2 ??F (36.8 ??C) (Oral)   ??? Resp 18   ??? Ht 5' 10.8" (1.798 m)   ??? Wt 181 lb 3.2 oz (82.2 kg)   ??? SpO2 99%   ??? BMI 25.42 kg/m2          Assessment & Plan:  [name] was seen today for pre-op exam.  Diagnoses and all orders for this visit:    Ganglion cyst    Pre-operative exam    Chronic fatigue- stable, defer to specialist    Essential tremor- stable, defer to specialist    Elevated alkaline phosphatase- stable, never had labs repeated, lab slip reprinted         Follow-up Disposition:  Return if symptoms worsen or fail to improve.         Advised him to call back or return to office if symptoms worsen/change/persist.  Discussed expected course/resolution/complications of diagnosis in detail with patient.     Medication  risks/benefits/costs/interactions/alternatives discussed with patient.  He was given an after visit summary which includes diagnoses, current medications, & vitals.  He expressed understanding with the diagnosis and plan.        Antony Haste, MD

## 2016-01-01 NOTE — Progress Notes (Signed)
Preoperative note faxed, confirmation received.

## 2016-01-04 NOTE — Telephone Encounter (Signed)
Faxed preop note and EKG to Dr Foster Simpson to fax # [phone]. Confirmation received.

## 2016-01-04 NOTE — Telephone Encounter (Signed)
Please fax notes EKG to [phone]  Dr.[email or web address]

## 2016-01-25 NOTE — Telephone Encounter (Signed)
No abx for a broken toe.  Most broken toes do not need any treatment (surgery).  Buddy taping is the treatment and anti-inflammatory medications.  I will keep an eye out for his records.

## 2016-01-25 NOTE — Telephone Encounter (Signed)
[phone] pt says that he broke his little toe last week and went to ortho on call, wants to know if there is anything else he needs to do.

## 2016-01-25 NOTE — Telephone Encounter (Signed)
Called pt and informed him ortho on call is correct no abx for broken toe or surgery usually, only buddy taping and anti inflammatory. Pt states he has records from his visit and will bring them to office.

## 2016-01-25 NOTE — Telephone Encounter (Signed)
Pt states he broke his little toe and went to orhto on call and they told him to wrap his little toe to the next toe and leave for 4 to 6 weeks. Should he do anything else for his broken toe like take an abx. He stated they did take xrays and did not tell him he needed an abx.

## 2016-02-02 LAB — ALK PHOS ISOENZYMES
Alk. phosphatase: 116 IU/L (ref 39–117)
Bone fraction: 49 % (ref 12–68)
Intestinal fraction: 8 % (ref 0–18)
Liver fraction: 43 % (ref 13–88)

## 2016-02-02 NOTE — Progress Notes (Signed)
Letter sent to patient.  Alk Phos & ratio normal, previous abnl resolved

## 2016-02-04 ENCOUNTER — Ambulatory Visit: Payer: Managed Care, Other (non HMO) | Admitting: Hematology & Oncology

## 2016-02-04 ENCOUNTER — Other Ambulatory Visit: Payer: Managed Care, Other (non HMO)

## 2016-07-28 ENCOUNTER — Ambulatory Visit
Admit: 2016-07-28 | Discharge: 2016-07-28 | Payer: PRIVATE HEALTH INSURANCE | Attending: Internal Medicine | Primary: Internal Medicine

## 2016-07-28 ENCOUNTER — Encounter: Attending: Internal Medicine | Primary: Internal Medicine

## 2016-07-28 DIAGNOSIS — Z0000 Encounter for general adult medical examination without abnormal findings: Secondary | ICD-10-CM

## 2016-07-28 MED ORDER — ELUXADOLINE 100 MG TABLET
100 mg | ORAL_TABLET | Freq: Two times a day (BID) | ORAL | 0 refills | Status: DC
Start: 2016-07-28 — End: 2016-10-31

## 2016-07-28 NOTE — Patient Instructions (Signed)
Well Visit, Men 50 to 65: Care Instructions  Your Care Instructions    Physical exams can help you stay healthy. Your doctor has checked your overall health and may have suggested ways to take good care of yourself. He or she also may have recommended tests. At home, you can help prevent illness with healthy eating, regular exercise, and other steps.  Follow-up care is a key part of your treatment and safety. Be sure to make and go to all appointments, and call your doctor if you are having problems. It's also a good idea to know your test results and keep a list of the medicines you take.  How can you care for yourself at home?  ?? Reach and stay at a healthy weight. This will lower your risk for many problems, such as obesity, diabetes, heart disease, and high blood pressure.  ?? Get at least 30 minutes of exercise on most days of the week. Walking is a good choice. You also may want to do other activities, such as running, swimming, cycling, or playing tennis or team sports.  ?? Do not smoke. Smoking can make health problems worse. If you need help quitting, talk to your doctor about stop-smoking programs and medicines. These can increase your chances of quitting for good.  ?? Protect your skin from too much sun. When you're outdoors from 10 a.m. to 4 p.m., stay in the shade or cover up with clothing and a hat with a wide brim. Wear sunglasses that block UV rays. Even when it's cloudy, put broad-spectrum sunscreen (SPF 30 or higher) on any exposed skin.  ?? See a dentist one or two times a year for checkups and to have your teeth cleaned.  ?? Wear a seat belt in the car.  ?? Limit alcohol to 2 drinks a day. Too much alcohol can cause health problems.  Follow your doctor's advice about when to have certain tests. These tests can spot problems early.  ?? Cholesterol. Your doctor will tell you how often to have this done based on your overall health and other things that can increase your risk for  heart attack and stroke.  ?? Blood pressure. Have your blood pressure checked during a routine doctor visit. Your doctor will tell you how often to check your blood pressure based on your age, your blood pressure results, and other factors.  ?? Prostate exam. Talk to your doctor about whether you should have a blood test (called a PSA test) for prostate cancer. Experts disagree on whether men should have this test. Some experts recommend that you discuss the benefits and risks of the test with your doctor.  ?? Diabetes. Ask your doctor whether you should have tests for diabetes.  ?? Vision. Some experts recommend that you have yearly exams for glaucoma and other age-related eye problems starting at age 52.  ?? Hearing. Tell your doctor if you notice any change in your hearing. You can have tests to find out how well you hear.  ?? Colon cancer. You should begin tests for colon cancer at age 52. You may have one of several tests. Your doctor will tell you how often to have tests based on your age and risk. Risks include whether you already had a precancerous polyp removed from your colon or whether your parent, brother, sister, or child has had colon cancer.  ?? Heart attack and stroke risk. At least every 4 to 6 years, you should have your risk for heart attack and   stroke assessed. Your doctor uses factors such as your age, blood pressure, cholesterol, and whether you smoke or have diabetes to show what your risk for a heart attack or stroke is over the next 10 years.  ?? Abdominal aortic aneurysm. Ask your doctor whether you should have a test to check for an aneurysm. You may need a test if you ever smoked or if your parent, brother, sister, or child has had an aneurysm.  When should you call for help?  Watch closely for changes in your health, and be sure to contact your doctor if you have any problems or symptoms that concern you.  Where can you learn more?  Go to http://www.healthwise.net/[employer].   Enter K916 in the search box to learn more about "Well Visit, Men 50 to 65: Care Instructions."  Current as of: Dec 25, 2015  Content Version: 11.4  ?? 2006-2017 [employer]wise[employer]. Care instructions adapted under license by [employer] (which disclaims liability or warranty for this information). If you have questions about a medical condition or this instruction, always ask your healthcare professional. Healthwise, Incorporated disclaims any warranty or liability for your use of this information.

## 2016-07-28 NOTE — Progress Notes (Signed)
John Friedman is a 50 y.o. male who was seen in clinic today (07/28/2016) for a full physical.      Assessment & Plan:   Diagnoses and all orders for this visit:    1. Routine physical examination  -     VZV AB, IGG  -     METABOLIC PANEL, COMPREHENSIVE  -     CBC W/O DIFF  -     LIPID PANEL  -     PROSTATE SPECIFIC AG  -     TSH 3RD GENERATION    2. H/O testicular cancer- defer to specialist in [place], no records available.    3. Subclinical hypothyroidism- asymptomatic, will repeat labs     4. Chronic fatigue- stable, continue current treatment and defer to specialist     5. Essential tremor- well controlled, continue current treatment and defer to specialist     6. Sleep disturbances- chronic, unchanged, he reports acting out his dreams at times, asking to talk to specialist about options  -     SLEEP MEDICINE REFERRAL    7. Irritable bowel syndrome with diarrhea- uncontrolled but stable, reviewed treatment options, will try meds below x 1 month  -     eluxadoline (VIBERZI) 100 mg tablet; Take 1 Tab by mouth two (2) times daily (with meals). Max Daily Amount: 200 mg.         Follow-up Disposition:  Return in about 1 year (around 07/28/2017) for FULL PHYSICAL - 30 minutes.        ------------------------------------------------------------------------------------------    Subjective:   John Friedman is here today for a full physical.      Health Maintenance  Immunizations:     Influenza: declined.  Tetanus: patient declines.  Shingles: not up to date - reviewed Shingrix, he does not recall ever having had chicken pox, interested in getting IgG tested.  Pneumonia: declined.    Cancer screening:     Prostate: reviewed guidelines, will do today.  Declined DRE  Colon: guidelines reviewed, UTD.      Patient Care Team:  Antony Haste, MD as PCP - General (Internal Medicine)  Pat Patrick, MD (Gastroenterology)  Ronald Lobo, MD (Cardiology)  Danella Deis, MD (Psychiatry)  Otilio Connors, MD (Neurology)   Sherran Needs, MD (Otolaryngology)  Lorelee New, MD (Allergy)  Annie Paras, MD (Hematology and Oncology)  Burney Gauze (Hematology and Oncology)  Aggie Moats (Internal Medicine)  Helix (Infectious Diseases)  Micheline Rough, DPM (Podiatry)       The following sections were reviewed & updated as appropriate: PMH, PSH, FH, and SH.      Patient Active Problem List   Diagnosis Code   ??? Subclinical hypothyroidism E03.9   ??? Essential tremor G25.0   ??? Chronic fatigue R53.82   ??? GERD (gastroesophageal reflux disease) K21.9   ??? H/O testicular cancer Z85.47   ??? Sleep disturbances G47.9   ??? Irritable bowel syndrome with diarrhea K58.0          Prior to Admission medications    Medication Sig Start Date End Date Taking? Authorizing Provider   OTHER VIP Nasal spray twice a day, mold exposure (vasoactive intestinal peptide).   Yes Historical Provider   cholestyramine (QUESTRAN) 4 gram packet Take 1 Packet by mouth three (3) times daily (with meals). As needed 07/24/14  Yes Historical Provider   armodafinil (NUVIGIL) 200 mg tab Take 1 Tab by mouth daily as needed.   Yes  Historical Provider   propranolol (INDERAL) 20 mg tablet Take 20 mg by mouth daily.   Yes Historical Provider   LYRICA 300 mg capsule TAKE ONE (1) CAPSULE BY MOUTH ONCE A DAY 06/26/13  Yes Antony Haste, MD   clonazepam [facility]) 1 mg tablet Take 1 mg by mouth two (2) times a day.   Yes Historical Provider   OTHER    Yes Historical Provider   clomiPRAMINE (ANAFRANIL) 50 mg capsule Take 3 Caps by mouth daily. 12/04/12  Yes Antony Haste, MD          Allergies   Allergen Reactions   ??? Latex Other (comments)     redness             Review of Systems   Constitutional: Negative for chills and fever.   Respiratory: Negative for cough and shortness of breath.    Cardiovascular: Negative for chest pain and palpitations.   Gastrointestinal: Positive for diarrhea. Negative for abdominal pain,  blood in stool, constipation, heartburn, nausea and vomiting.   Genitourinary:        He reports: nocturia x 0-1.  He denies: urinary hesitancy, urinary frequency, weak stream.   Musculoskeletal: Negative for joint pain and myalgias.   Neurological: Positive for tremors. Negative for tingling, focal weakness and headaches.   Endo/Heme/Allergies: Does not bruise/bleed easily.   Psychiatric/Behavioral: Negative for depression. The patient is not nervous/anxious and does not have insomnia.         He reports issue w/ sleep.  This is a long term issue. He reports at time acting out dreams.  Over Thanksgiving he reports bumping his head on a wall.         Objective:   Physical Exam   Constitutional: He appears well-developed. No distress.   HENT:   Right Ear: Tympanic membrane, external ear and ear canal normal.   Left Ear: Tympanic membrane, external ear and ear canal normal.   Nose: Nose normal.   Mouth/Throat: Uvula is midline, oropharynx is clear and moist and mucous membranes are normal. No posterior oropharyngeal erythema.   Eyes: Conjunctivae and lids are normal. No scleral icterus.   Neck: Neck supple. Carotid bruit is not present. No thyromegaly present.   Cardiovascular: Regular rhythm and normal heart sounds.  Tachycardia present.    No murmur heard.  Pulses:       Dorsalis pedis pulses are 2+ on the right side, and 2+ on the left side.        Posterior tibial pulses are 2+ on the right side, and 2+ on the left side.   Pulmonary/Chest: Effort normal and breath sounds normal. He has no wheezes. He has no rales.   Abdominal: Soft. Bowel sounds are normal. He exhibits no mass. There is no hepatosplenomegaly. There is no tenderness.   Musculoskeletal: He exhibits no edema.        Cervical back: Normal.        Thoracic back: He exhibits no bony tenderness.        Lumbar back: Normal.   Lymphadenopathy:     He has no cervical adenopathy.   Neurological: He has normal strength. No sensory deficit.    Skin: No rash noted.   Psychiatric: He has a normal mood and affect. His behavior is normal.          Visit Vitals   ??? BP 90/64   ??? Pulse (!) 108   ??? Temp 97.7 ??F (36.5 ??C) (Oral)   ???  Resp 18   ??? Ht 5' 10.65" (1.795 m)   ??? Wt 178 lb (80.7 kg)   ??? SpO2 99%   ??? BMI 25.07 kg/m2          Advised him to call back or return to office if symptoms worsen/change/persist.  Discussed expected course/resolution/complications of diagnosis in detail with patient.    Medication risks/benefits/costs/interactions/alternatives discussed with patient.  He was given an after visit summary which includes diagnoses, current medications, & vitals.  He expressed understanding with the diagnosis and plan.      Aspects of this note may have been generated using voice recognition software. Despite editing, there may be some syntax errors.       Antony Haste, MD

## 2016-07-28 NOTE — Progress Notes (Signed)
CPE

## 2016-08-02 LAB — METABOLIC PANEL, COMPREHENSIVE
A-G Ratio: 2 (ref 1.2–2.2)
ALT (SGPT): 34 IU/L (ref 0–44)
AST (SGOT): 28 IU/L (ref 0–40)
Albumin: 4.5 g/dL (ref 3.5–5.5)
Alk. phosphatase: 122 IU/L — ABNORMAL HIGH (ref 39–117)
BUN/Creatinine ratio: 15 (ref 9–20)
BUN: 15 mg/dL (ref 6–24)
Bilirubin, total: 0.2 mg/dL (ref 0.0–1.2)
CO2: 24 mmol/L (ref 18–29)
Calcium: 9.4 mg/dL (ref 8.7–10.2)
Chloride: 95 mmol/L — ABNORMAL LOW (ref 96–106)
Creatinine: 0.99 mg/dL (ref 0.76–1.27)
GFR est AA: 101 mL/min/{1.73_m2} (ref >59)
GFR est non-AA: 87 mL/min/{1.73_m2} (ref >59)
GLOBULIN, TOTAL: 2.3 g/dL (ref 1.5–4.5)
Glucose: 94 mg/dL (ref 65–99)
Potassium: 4.3 mmol/L (ref 3.5–5.2)
Protein, total: 6.8 g/dL (ref 6.0–8.5)
Sodium: 134 mmol/L (ref 134–144)

## 2016-08-02 LAB — CBC W/O DIFF
HCT: 44 % (ref 37.5–51.0)
HGB: 15.3 g/dL (ref 13.0–17.7)
MCH: 31.1 pg (ref 26.6–33.0)
MCHC: 34.8 g/dL (ref 31.5–35.7)
MCV: 89 fL (ref 79–97)
PLATELET: 174 10*3/uL (ref 150–379)
RBC: 4.92 x10E6/uL (ref 4.14–5.80)
RDW: 14 % (ref 12.3–15.4)
WBC: 4.2 10*3/uL (ref 3.4–10.8)

## 2016-08-02 LAB — LIPID PANEL
Cholesterol, total: 162 mg/dL (ref 100–199)
HDL Cholesterol: 45 mg/dL (ref >39)
LDL, calculated: 96 mg/dL (ref 0–99)
Triglyceride: 104 mg/dL (ref 0–149)
VLDL, calculated: 21 mg/dL (ref 5–40)

## 2016-08-02 LAB — PSA, DIAGNOSTIC (PROSTATE SPECIFIC AG): Prostate Specific Ag: 0.7 ng/mL (ref 0.0–4.0)

## 2016-08-02 LAB — TSH 3RD GENERATION: TSH: 8.49 u[IU]/mL — ABNORMAL HIGH (ref 0.450–4.500)

## 2016-08-02 LAB — VZV AB, IGG: VARICELLA ZOSTER IGG: <135 index — ABNORMAL LOW (ref >165)

## 2016-08-02 NOTE — Progress Notes (Signed)
Letter sent to patient.  All labs are stable or at goal except for no abs to Varicella and stable elevated TSH.  Would recommend trial of T4 replacement due to fatigue.  Will recommend shingles vaccine.  2yr CVD risk is 1.9%

## 2016-08-05 ENCOUNTER — Ambulatory Visit (HOSPITAL_BASED_OUTPATIENT_CLINIC_OR_DEPARTMENT_OTHER): Payer: Managed Care, Other (non HMO) | Admitting: Hematology & Oncology

## 2016-08-05 ENCOUNTER — Other Ambulatory Visit (HOSPITAL_BASED_OUTPATIENT_CLINIC_OR_DEPARTMENT_OTHER): Payer: Managed Care, Other (non HMO)

## 2016-08-05 VITALS — BP 96/74 | HR 95 | Temp 97.5°F | Resp 16 | Wt 177.0 lb

## 2016-08-05 DIAGNOSIS — G479 Sleep disorder, unspecified: Secondary | ICD-10-CM | POA: Insufficient documentation

## 2016-08-05 DIAGNOSIS — Z8547 Personal history of malignant neoplasm of testis: Secondary | ICD-10-CM

## 2016-08-05 DIAGNOSIS — Z9221 Personal history of antineoplastic chemotherapy: Secondary | ICD-10-CM

## 2016-08-05 DIAGNOSIS — E039 Hypothyroidism, unspecified: Secondary | ICD-10-CM | POA: Insufficient documentation

## 2016-08-05 DIAGNOSIS — E038 Other specified hypothyroidism: Secondary | ICD-10-CM | POA: Insufficient documentation

## 2016-08-05 DIAGNOSIS — K219 Gastro-esophageal reflux disease without esophagitis: Secondary | ICD-10-CM | POA: Insufficient documentation

## 2016-08-05 LAB — CBC WITH DIFFERENTIAL (CANCER CENTER ONLY)
BASO#: 0 10*3/uL (ref 0.0–0.2)
BASO%: 0.4 % (ref 0.0–2.0)
EOS%: 2.2 % (ref 0.0–7.0)
Eosinophils Absolute: 0.1 10*3/uL (ref 0.0–0.5)
HCT: 43.2 % (ref 38.7–49.9)
HEMOGLOBIN: 15.2 g/dL (ref 13.0–17.1)
LYMPH#: 1 10*3/uL (ref 0.9–3.3)
LYMPH%: 37.3 % (ref 14.0–48.0)
MCH: 31.2 pg (ref 28.0–33.4)
MCHC: 35.2 g/dL (ref 32.0–35.9)
MCV: 89 fL (ref 82–98)
MONO#: 0.5 10*3/uL (ref 0.1–0.9)
MONO%: 17.5 % — AB (ref 0.0–13.0)
NEUT%: 42.6 % (ref 40.0–80.0)
NEUTROS ABS: 1.1 10*3/uL — AB (ref 1.5–6.5)
Platelets: 146 10*3/uL (ref 145–400)
RBC: 4.87 10*6/uL (ref 4.20–5.70)
RDW: 12.9 % (ref 11.1–15.7)
WBC: 2.7 10*3/uL — AB (ref 4.0–10.0)

## 2016-08-05 LAB — COMPREHENSIVE METABOLIC PANEL
ALT: 31 U/L (ref 0–55)
AST: 26 U/L (ref 5–34)
Albumin: 4.3 g/dL (ref 3.5–5.0)
Alkaline Phosphatase: 115 U/L (ref 40–150)
Anion Gap: 7 mEq/L (ref 3–11)
BILIRUBIN TOTAL: 0.33 mg/dL (ref 0.20–1.20)
BUN: 13.3 mg/dL (ref 7.0–26.0)
CO2: 22 meq/L (ref 22–29)
Calcium: 9.3 mg/dL (ref 8.4–10.4)
Chloride: 106 mEq/L (ref 98–109)
Creatinine: 0.9 mg/dL (ref 0.7–1.3)
GLUCOSE: 98 mg/dL (ref 70–140)
Potassium: 4.1 mEq/L (ref 3.5–5.1)
SODIUM: 135 meq/L — AB (ref 136–145)
TOTAL PROTEIN: 7.1 g/dL (ref 6.4–8.3)

## 2016-08-05 LAB — LACTATE DEHYDROGENASE: LDH: 171 U/L (ref 125–245)

## 2016-08-05 NOTE — Progress Notes (Signed)
Hematology and Oncology Follow Up Visit  Charles Lowe [id number] 07-09-1966 50 y.o. 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  Current Therapy:    Observation     Interim History:  Charles Lowe is back for followup. We last saw him a year ago.Marland Kitchen He still lives in [place]. He is still working for an IT consultant.  He went to [place] this summer for a conference. He had a good time.  He does have chronic fatigue syndrome. He is being followed for this.  He has had no problem with infections. He's had no fever. He's had no rashes. He's had no nausea or vomiting.  He will be spending Christmas with his mother. She is doing well. I take care of her for breast cancer.  He has had no issues with weight loss or weight gain.  He is trying to exercise.   Overall, his performance status is ECOG 1.  Medications:  Current Outpatient Prescriptions:  .  Armodafinil 200 MG TABS, Take 200 tablets by mouth daily as needed., Disp: , Rfl:  .  cholestyramine (QUESTRAN) 4 g packet, Take 1 packet by mouth daily as needed., Disp: , Rfl:  .  clomiPRAMINE (ANAFRANIL) 50 MG capsule, Take 150 mg by mouth at bedtime., Disp: , Rfl:  .  clonazePAM (KLONOPIN) 0.5 MG tablet, Take 0.5 mg by mouth 2 (two) times daily as needed. For anxiety, Disp: , Rfl:  .  Eluxadoline 100 MG TABS, Take 100 mg by mouth., Disp: , Rfl:  .  pregabalin (LYRICA) 300 MG capsule, Take 300 mg by mouth at bedtime. , Disp: , Rfl:  .  propranolol (INDERAL) 20 MG tablet, Take 20 mg by mouth as needed. For tremors, Disp: , Rfl:  .  propranolol (INDERAL) 20 MG tablet, Take 20 mg by mouth once., Disp: , Rfl:   Allergies:  Allergies  Allergen Reactions  . Latex Dermatitis    redness CAUSES SKIN REDNESS    Past Medical History, Surgical history, Social history, and Family History were reviewed and updated.  Review of Systems: As above  Physical Exam:  weight is 177 lb (80.3 kg). His oral temperature is 97.5 F (36.4 C). His blood pressure is 96/74 and his pulse is 95. His respiration is 16.   Pale white gentleman in no obvious distress. Head and neck exam shows no ocular or oral lesions. He has no scleral icterus. He has no adenopathy in his neck. Thyroid is nonpalpable. Lungs are clear bilaterally. Cardiac exam shows a regular rate and rhythm with no murmurs, rubs or bruits. Abdomen is soft. He has good bowel sounds. He has well-healed left inguinal orchiectomy scar. There is no palpable liver or spleen. Back exam shows no tenderness over the spine ribs or hips. Extremities shows no clubbing cyanosis or edema. Skin exam shows no rashes ecchymoses or petechia. Neurological exam is nonfocal.  Lab Results  Component Value Date   WBC 2.7 (L) 08/05/2016   HGB 15.2 08/05/2016   HCT 43.2 08/05/2016   MCV 89 08/05/2016   PLT 146 08/05/2016     Chemistry      Component Value Date/Time   NA 135 (L) 08/05/2016 0903   K 4.1 08/05/2016 0903   CL 103 08/06/2015 1503   CO2 22 08/05/2016 0903   BUN 13.3 08/05/2016 0903   CREATININE 0.9 08/05/2016 0903      Component Value Date/Time   CALCIUM 9.3 08/05/2016  [id number] 08/05/2016 0903   AST 26 08/05/2016 0903   ALT 31 08/05/2016 0903   BILITOT 0.33 08/05/2016 0903         Impression and Plan: Charles Lowe is 50 year old male with a past history of stage III non- seminomatous germ cell  tumor of the left testicle. He did receive systemic chemotherapy with BEP for 3 cycles. He completed this back in 2001.  I do not see any evidence of recurrence.  I am worried about his white cell count going down. 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  I looked at his blood on the microscope. I do not see anything that looked like leukemia. He had maybe an immature myeloid cell. I saw no nucleated red  blood cells.   Since he lives in [place], I gave him a prescription to have a blood work done in 3 or 4 months.  I will see him back in 6 months. If his white cell count is no better, then I think we will half to consider doing a bone marrow test on him. I spoke to him about this. I talked to him for about 30 minutes. He understands the situation. He agrees with our recommendations.  [name], MD 12/22/201711:21 AM

## 2016-08-06 LAB — AFP TUMOR MARKER: AFP, SERUM, TUMOR MARKER: 7.3 ng/mL (ref 0.0–8.3)

## 2016-08-06 LAB — ALPHA FETO PROTEIN (PARALLEL TESTING): AFP-Tumor Marker: 7.2 ng/mL — ABNORMAL HIGH (ref <6.1)

## 2016-08-06 LAB — BETA HCG QUANT (REF LAB)

## 2016-09-26 NOTE — Telephone Encounter (Signed)
Would you please assist in having this encounter closed out.    Thank you

## 2016-10-31 ENCOUNTER — Ambulatory Visit
Admit: 2016-10-31 | Discharge: 2016-10-31 | Payer: PRIVATE HEALTH INSURANCE | Attending: Internal Medicine | Primary: Internal Medicine

## 2016-10-31 DIAGNOSIS — R079 Chest pain, unspecified: Secondary | ICD-10-CM

## 2016-10-31 NOTE — Progress Notes (Signed)
Patient walked in. Under a lot of stress at work, thinks he may have had a mild heart attack last Thursday. Experienced some chest pressure, no SOB, arm pain, jaw pain.  Was not evaluated anywhere.  No current symptoms. Scheduled for 10 am with Dr. Amedeo Plenty. Offered to have him wait and would get him back sooner if had an opening or with one of the partners. He declined and will go to work and return at 6.

## 2016-10-31 NOTE — Progress Notes (Signed)
John Friedman is a 51 y.o. male who was seen in clinic today (10/31/2016).    Patient was seen with Dr Anderson Malta Simpliciano (R2 at [facility]).       Assessment & Plan:   Diagnoses and all orders for this visit:    1. Chest pain, unspecified type- this is a new problem, symptoms are: unchanged x 4 days, differential dx reviewed with the patient and is not entirely sure.  Reassured this does not sound cardiac, but he wants to talk to a specialist, referral placed.  Do not think this is muscular, GI, or PE related.  Possibly stress related.  Will work on stress relief.  No medication changes.  See AVS, Red flags were reviewed with the patient to RTC or notify me, expected time course for resolution reviewed.   -     AMB POC EKG ROUTINE W/ 12 LEADS, INTER & REP  -     Giusti Card [place]    2. Stress- does not seem well controlled, multiple social/personal stressors, reviewed treatment options with him, reviewed life style changes to help improve mood, reviewed daily vs prn meds but will defer at this time.            Follow-up Disposition:  Return if symptoms worsen or fail to improve.      Subjective:   John Friedman was seen today for Chest Pain    Chest Pain  Patient complains of chest pain. Onset was last week,Thursday, 4 days ago.      The patient admits to chest discomfort that is constant, located in upper chest bilaterally, with radiation to no where, and is in intensity that is pressure/tightness in nature.  Associated symptoms are none. Aggravating factors are none.  Alleviating factors are: nothing. Patient's cardiac risk factors are nothing.  Patient's risk factors for DVT/PE: none. Previous cardiac testing includes: stress echo done in Jan '13 - normal.  TTE in Nov '14 showed nl EF and grade I diastolic dysfunction.    Stress is higher currently.  Increase stress at work.  Increase stress at home (not able to get good night sleep the last 2 weekends due to neighbor's dog)        Prior to Admission medications     Medication Sig Start Date End Date Taking? Authorizing Provider   OTHER VIP Nasal spray twice a day, mold exposure (vasoactive intestinal peptide).   Yes Historical Provider   armodafinil (NUVIGIL) 200 mg tab Take 1 Tab by mouth daily as needed.   Yes Historical Provider   propranolol (INDERAL) 20 mg tablet Take 20 mg by mouth daily.   Yes Historical Provider   LYRICA 300 mg capsule TAKE ONE (1) CAPSULE BY MOUTH ONCE A DAY 06/26/13  Yes Antony Haste, MD   clonazepam [facility]) 1 mg tablet Take 1 mg by mouth two (2) times a day.   Yes Historical Provider   OTHER    Yes Historical Provider   clomiPRAMINE (ANAFRANIL) 50 mg capsule Take 3 Caps by mouth daily. 12/04/12  Yes Antony Haste, MD   eluxadoline (VIBERZI) 100 mg tablet Take 1 Tab by mouth two (2) times daily (with meals). Max Daily Amount: 200 mg. 07/28/16   Antony Haste, MD   cholestyramine Lucrezia Starch) 4 gram packet Take 1 Packet by mouth three (3) times daily (with meals). As needed 07/24/14   Historical Provider          Allergies   Allergen Reactions   ???  Latex Other (comments)     redness           Review of Systems   Constitutional: Positive for malaise/fatigue (chronic). Negative for weight loss.   Respiratory: Negative for cough and shortness of breath.    Cardiovascular: Positive for chest pain. Negative for palpitations and leg swelling.   Gastrointestinal: Negative for abdominal pain, constipation, diarrhea, heartburn, nausea and vomiting.   Genitourinary: Negative for frequency.   Musculoskeletal: Negative for joint pain and myalgias.   Skin: Negative for rash.   Neurological: Negative for tingling, sensory change, focal weakness and headaches.   Psychiatric/Behavioral: Negative for depression. The patient is not nervous/anxious and does not have insomnia.             Objective:   Physical Exam   Eyes: Conjunctivae are normal. No scleral icterus.   Neck: Carotid bruit is not present.    Cardiovascular: Regular rhythm and normal heart sounds.    No murmur heard.  Pulmonary/Chest: Effort normal and breath sounds normal. He has no wheezes. He has no rales. He exhibits no tenderness.   Abdominal: Soft. Bowel sounds are normal. He exhibits no mass. There is no hepatosplenomegaly. There is no tenderness.   Musculoskeletal: He exhibits no edema.         Visit Vitals   ??? BP 112/86   ??? Pulse 76   ??? Temp 97.7 ??F (36.5 ??C) (Oral)   ??? Resp 20   ??? Ht 5' 10.65" (1.795 m)   ??? Wt 186 lb (84.4 kg)   ??? SpO2 99%   ??? BMI 26.2 kg/m2         Disclaimer:  Advised him to call back or return to office if symptoms worsen/change/persist.  Discussed expected course/resolution/complications of diagnosis in detail with patient.    Medication risks/benefits/costs/interactions/alternatives discussed with patient.  He was given an after visit summary which includes diagnoses, current medications, & vitals.  He expressed understanding with the diagnosis and plan.      Aspects of this note may have been generated using voice recognition software. Despite editing, there may be some syntax errors.       Antony Haste, MD

## 2016-10-31 NOTE — Patient Instructions (Signed)
Chest Pain: Care Instructions  Your Care Instructions    There are many things that can cause chest pain. Some are not serious and will get better on their own in a few days. But some kinds of chest pain need more testing and treatment. Your doctor may have recommended a follow-up visit in the next 8 to 12 hours. If you are not getting better, you may need more tests or treatment.  Even though your doctor has released you, you still need to watch for any problems. The doctor carefully checked you, but sometimes problems can develop later. If you have new symptoms or if your symptoms do not get better, get medical care right away.  If you have worse or different chest pain or pressure that lasts more than 5 minutes or you passed out (lost consciousness), call 911 or seek other emergency help right away.   A medical visit is only one step in your treatment. Even if you feel better, you still need to do what your doctor recommends, such as going to all suggested follow-up appointments and taking medicines exactly as directed. This will help you recover and help prevent future problems.  How can you care for yourself at home?  ?? Rest until you feel better.  ?? Take your medicine exactly as prescribed. Call your doctor if you think you are having a problem with your medicine.  ?? Do not drive after taking a prescription pain medicine.  When should you call for help?  Call 911 if:  ? ?? You passed out (lost consciousness).   ? ?? You have severe difficulty breathing.   ? ?? You have symptoms of a heart attack. These may include:  ?? Chest pain or pressure, or a strange feeling in your chest.  ?? Sweating.  ?? Shortness of breath.  ?? Nausea or vomiting.  ?? Pain, pressure, or a strange feeling in your back, neck, jaw, or upper belly or in one or both shoulders or arms.  ?? Lightheadedness or sudden weakness.  ?? A fast or irregular heartbeat.  After you call 911, the operator may tell you to chew 1 adult-strength or  2 to 4 low-dose aspirin. Wait for an ambulance. Do not try to drive yourself.   ?Call your doctor today if:  ? ?? You have any trouble breathing.   ? ?? Your chest pain gets worse.   ? ?? You are dizzy or lightheaded, or you feel like you may faint.   ? ?? You are not getting better as expected.   ? ?? You are having new or different chest pain.   Where can you learn more?  Go to http://www.healthwise.net/[employer].  Enter A120 in the search box to learn more about "Chest Pain: Care Instructions."  Current as of: November 02, 2015  Content Version: 11.4  ?? 2006-2017 [employer]. Care instructions adapted under license by [employer] (which disclaims liability or warranty for this information). If you have questions about a medical condition or this instruction, always ask your healthcare professional. Healthwise, Inc[employer] disclaims any warranty or liability for your use of this information.

## 2016-10-31 NOTE — Progress Notes (Signed)
Awoke on Thursday with chest pain and Friday, lots of stress at work.  No evaluated.  Some chest tightness today.

## 2016-11-23 ENCOUNTER — Ambulatory Visit
Admit: 2016-11-23 | Discharge: 2016-11-23 | Payer: PRIVATE HEALTH INSURANCE | Attending: Specialist | Primary: Internal Medicine

## 2016-11-23 DIAGNOSIS — R079 Chest pain, unspecified: Secondary | ICD-10-CM

## 2016-11-23 NOTE — Patient Instructions (Signed)
Stress Echo and see  Dr.Giusti on same day

## 2016-11-23 NOTE — Addendum Note (Signed)
Addended by: Herbie Drape on: 11/23/2016 09:53 AM      Modules accepted: Orders

## 2016-11-23 NOTE — Progress Notes (Signed)
HISTORY OF PRESENT ILLNESS  John Friedman is a 51 y.o. male. Patient with h/o chronic fatigue syndrome, essential tremors, gastroesophageal reflux disease, testicular cancer status post orchiectomy, and irritable bowel syndrome who presents today for evaluation of chest discomfort.  He also tells me that he has diastolic dysfunction which has been diagnosed by his doctor who takes care of his chronic fatigue syndrome in [place] apparently has echocardiogram every year.  Past Medical History:   Diagnosis Date   ??? Cancer ([place])     left testicle- stage III, nonseminomatous germ cell tumor, s/p orchiectomy & BEP x 3 cycles   ??? Chronic fatigue January 1987   ??? Essential tremor    ??? GERD (gastroesophageal reflux disease)    ??? Hypothyroid    ??? 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 Lumbar radiculopathy 08/10/2012    S/p steroid injection by Dr Laverta Macon 08/01/12    ??? Normal cardiac stress test 09/05/11   ??? Sleep disturbances     acting out in his sleep     Past Surgical History:   Procedure Laterality Date   ??? HX CHOLECYSTECTOMY  12/05/2011    abnormal HIDA scan (EF < 2%)   ??? HX COLONOSCOPY  07/12/10    hyperplastic polyp, repeat 10 yrs   ??? HX COLONOSCOPY  02/23/15    normal   ??? HX CYST REMOVAL Right 01/06/2016    3rd toe ganglion cyst removal - podiatry (Dr Raliegh Ip)   ??? HX GI  09/09/11    EGD- normal   ??? HX HEENT Left 06/2012    tonsilar bx: benign tissue (Dr Buddy Duty)   ??? HX ORCHIECTOMY Left 2002   ??? HX ORTHOPAEDIC      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19     Social History   Substance Use Topics   ??? Smoking status: Never Smoker   ??? Smokeless tobacco: Never Used   ??? Alcohol use Yes      Comment: rarely     Family History   Problem Relation Age of Onset   ??? No Known Problems Mother    ??? Parkinsonism Father    ??? Arthritis-osteo Father      scoliosis   ??? No Known Problems Brother    ??? No Known Problems Brother    ??? No Known Problems Sister    ??? Cancer Maternal Grandfather      prostate    ??? Cancer Paternal Grandfather      prostate       HPI  He tells me he developed chest discomfort while driving one day to work after having had a stressful day today before the chest discomfort was widespread to his chest.  It lasted a day and a half he tells me.  He tells me that he is having some minor chest pains off and on the last month or so.  No palpitation or shortness of breath reported.  Review of Systems   Cardiovascular: Positive for chest pain.       Physical Exam  Physical Exam   Blood pressure 102/80, pulse (!) 102, height 5\' 11"  (1.803 m), weight 81.2 kg (179 lb).  Constitutional: He is oriented to person, place, and time. He appears well-developed and well-nourished. No distress. HENT: Head: Normocephalic. Eyes: No scleral icterus. Neck: Normal range of motion. Neck supple. No JVD present. No tracheal deviation present.   Cardiovascular: Normal rate, regular rhythm, normal heart sounds and intact distal pulses.  Exam  reveals no gallop and no friction rub.  No murmur heard.  Pulmonary/Chest: Effort normal and breath sounds normal. No stridor. No respiratory distress, wheezes or  rales.   Abdominal: He exhibits no distension.   Musculoskeletal: He exhibits no edema.   Neurological: He is alert and oriented to person, place, and time. Coordination normal.   Skin: Skin is warm. No rash noted. Not diaphoretic. No erythema.   Psychiatric:  Normal mood and affect.  Behavior is normal.   Current Outpatient Prescriptions on File Prior to Visit   Medication Sig Dispense Refill   ??? OTHER VIP Nasal spray twice a day, mold exposure (vasoactive intestinal peptide).     ??? armodafinil (NUVIGIL) 200 mg tab Take 1 Tab by mouth daily as needed.     ??? propranolol (INDERAL) 20 mg tablet Take 20 mg by mouth daily.     ??? LYRICA 300 mg capsule TAKE ONE (1) CAPSULE BY MOUTH ONCE A DAY 90 capsule 1   ??? clonazepam (KLONOPIN) 1 mg tablet Take 1 mg by mouth two (2) times a day.     ??? OTHER Supplement drink      ??? clomiPRAMINE (ANAFRANIL) 50 mg capsule Take 3 Caps by mouth daily. 270 Cap 1     No current facility-administered medications on file prior to visit.      Lab Results   Component Value Date/Time    Cholesterol, total 162 08/01/2016 07:41 AM    HDL Cholesterol 45 08/01/2016 07:41 AM    LDL, calculated 96 08/01/2016 07:41 AM    VLDL, calculated 21 08/01/2016 07:41 AM    Triglyceride 104 08/01/2016 07:41 AM     Lab Results   Component Value Date/Time    WBC 4.2 08/01/2016 07:41 AM    HGB 15.3 08/01/2016 07:41 AM    HCT 44.0 08/01/2016 07:41 AM    PLATELET 174 08/01/2016 07:41 AM    MCV 89 08/01/2016 07:41 AM     Lab Results   Component Value Date/Time    Sodium 134 08/01/2016 07:41 AM    Potassium 4.3 08/01/2016 07:41 AM    Chloride 95 (L) 08/01/2016 07:41 AM    CO2 24 08/01/2016 07:41 AM    Anion gap 10 09/01/2015 04:37 PM    Glucose 94 08/01/2016 07:41 AM    BUN 15 08/01/2016 07:41 AM    Creatinine 0.99 08/01/2016 07:41 AM    BUN/Creatinine ratio 15 08/01/2016 07:41 AM    GFR est AA 101 08/01/2016 07:41 AM    GFR est non-AA 87 08/01/2016 07:41 AM    Calcium 9.4 08/01/2016 07:41 AM     Lab Results   Component Value Date/Time    WBC 4.2 08/01/2016 07:41 AM    HGB 15.3 08/01/2016 07:41 AM    HCT 44.0 08/01/2016 07:41 AM    PLATELET 174 08/01/2016 07:41 AM    MCV 89 08/01/2016 07:41 AM       ASSESSMENT and PLAN  Chest pain: This is a somewhat atypical.  His electrocardiogram on October 31, 2016 revealed normal sinus rhythm without significant ST-T wave abnormalities or Q waves.  Discussed options.  Proceed with a stress echocardiogram.    Chronic fatigue syndrome: Significant but high functioning.  He works 60 hours a week.    Significant hypertension hyperlipidemia.    See him back after the results of the stress echo.

## 2016-11-23 NOTE — Progress Notes (Signed)
Verified patient with two types of identifiers.   Verified medications with the patient.    Verified pharmacy with patient.

## 2016-12-21 ENCOUNTER — Institutional Professional Consult (permissible substitution): Primary: Internal Medicine

## 2016-12-21 ENCOUNTER — Ambulatory Visit
Admit: 2016-12-21 | Discharge: 2016-12-21 | Payer: PRIVATE HEALTH INSURANCE | Attending: Specialist | Primary: Internal Medicine

## 2016-12-21 ENCOUNTER — Institutional Professional Consult (permissible substitution): Admit: 2016-12-21 | Discharge: 2016-12-21 | Payer: PRIVATE HEALTH INSURANCE | Primary: Internal Medicine

## 2016-12-21 ENCOUNTER — Encounter: Admit: 2016-12-21 | Discharge: 2016-12-21 | Payer: PRIVATE HEALTH INSURANCE | Primary: Internal Medicine

## 2016-12-21 DIAGNOSIS — R079 Chest pain, unspecified: Secondary | ICD-10-CM

## 2016-12-21 NOTE — Progress Notes (Signed)
HISTORY OF PRESENT ILLNESS  John Friedman is a 51 y.o. male.Patient with h/o chronic fatigue syndrome, essential tremors, gastroesophageal reflux disease, testicular cancer status post orchiectomy, and irritable bowel syndrome who presents today for evaluation of chest discomfort.  He also tells me that he has diastolic dysfunction which has been diagnosed by his doctor who takes care of his chronic fatigue syndrome in [place] apparently has echocardiogram every year.  STRESS ECHO on 5/18 no ischemia or mi and ef 60% mild mr trace ai       Past Medical History:   Diagnosis Date   ??? Cancer ([place]) ??   ?? left testicle- stage III, nonseminomatous germ cell tumor, s/p orchiectomy & BEP x 3 cycles   ??? Chronic fatigue January 1987   ??? Essential tremor ??   ??? GERD (gastroesophageal reflux disease) ??   ??? Hypothyroid ??   ??? 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 Lumbar radiculopathy 08/10/2012   ?? S/p steroid injection by Dr Laverta Henrietta 08/01/12    ??? Normal cardiac stress test 09/05/11   ??? Sleep disturbances ??   ?? acting out in his sleep   ??        Past Surgical History:   Procedure Laterality Date   ??? HX CHOLECYSTECTOMY ?? 12/05/2011   ?? abnormal HIDA scan (EF < 2%)   ??? HX COLONOSCOPY ?? 07/12/10   ?? hyperplastic polyp, repeat 10 yrs   ??? HX COLONOSCOPY ?? 02/23/15   ?? normal   ??? HX CYST REMOVAL Right 01/06/2016   ?? 3rd toe ganglion cyst removal - podiatry (Dr Raliegh Ip)   ??? HX GI ?? 09/09/11   ?? EGD- normal   ??? HX HEENT Left 06/2012   ?? tonsilar bx: benign tissue (Dr Buddy Duty)   ??? HX ORCHIECTOMY Left 2002   ??? HX ORTHOPAEDIC ?? ??   ?? 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67   ??          Social History    Substance Use Topics    ??? Smoking status: Never Smoker    ??? Smokeless tobacco: Never Used    ??? Alcohol use Yes   ?? ?? ?? Comment: rarely    ??         Family History   Problem Relation Age of Onset   ??? No Known Problems Mother ??   ??? Parkinsonism Father ??   ??? Arthritis-osteo Father ??   ?? ?? scoliosis   ??? No Known Problems Brother ??    ??? No Known Problems Brother ??   ??? No Known Problems Sister ??   ??? Cancer Maternal Grandfather ??   ?? ?? prostate   ??? Cancer Paternal Grandfather ??   ?? ?? prostate       HPI  He has not had cp in 10 days  Review of Systems   Respiratory: Negative.    Cardiovascular: Negative.        Physical Exam  Visit Vitals   ??? BP 100/74 (BP 1 Location: Left arm, BP Patient Position: Sitting)   ??? Pulse 82   ??? Resp 16   ??? Ht 5\' 11"  (1.803 m)   ??? Wt 81.5 kg (179 lb 9.6 oz)   ??? SpO2 98%   ??? BMI 25.05 kg/m2       ASSESSMENT and PLAN  Chest pain: He has had no recurrence of chest pain the last 10 days.  His stress echocardiogram today failed  to reveal any particular ischemia and ejection fraction was normal 60%.  We have discussed the presence of mild mitral regurgitation but I do not feel there is any particular need to follow-up at this point may be repeat an echocardiogram in 2-3 years now.  I have asked him to follow-up with his primary care physician to see if GI evaluation will be a consideration given the fact that the chest pain does not appear to be cardiac related at this time.    ??  Chronic fatigue syndrome: Significant but high functioning.  He works 60 hours a week.  ??   hyperlipidemia: Closely followed by his primary care physician.    His blood pressure is well controlled we have discussed the need for routine exercise.  ??  Otherwise I will see him back on a as needed basis.

## 2017-01-03 ENCOUNTER — Ambulatory Visit: Admit: 2017-01-03 | Payer: PRIVATE HEALTH INSURANCE | Attending: Internal Medicine | Primary: Internal Medicine

## 2017-01-03 DIAGNOSIS — L989 Disorder of the skin and subcutaneous tissue, unspecified: Secondary | ICD-10-CM

## 2017-01-03 NOTE — Progress Notes (Signed)
Mole on right knee.

## 2017-01-03 NOTE — Progress Notes (Signed)
John Friedman is a 51 y.o. male who was seen in clinic today (01/03/2017).  Patient was seen with Dr Dorene Sorrow (R2 at [facility]).        Assessment & Plan:   Diagnoses and all orders for this visit:    1. Skin lesion of right leg- this is a chronic problem, symptoms are: changing per him, differential dx reviewed with the patient, looks like a scab that has been itched however due to concerns and pt's desire will have him see dermatology.  Reviewed to keep covered, stopping picking, and if no changes should f/u with specialist.   Red flags were reviewed with the patient to RTC or notify me.   -     REFERRAL TO DERMATOLOGY       Reviewed Shing[facility], he will talk to specialist.      Follow-up Disposition:  Return if symptoms worsen or fail to improve.      Subjective:   [name] was seen today for Mole    Dermatology Review  He is here to talk about mole.  He noticed it years ago, with slight since that time.  Location: right knee.  Symptoms include redness.  He was picked at it and is not sure if that is what caused the changes.  He denies any itching or pain.  Treatment to date has included none.  No fevers or chills.          Prior to Admission medications    Medication Sig Start Date End Date Taking? Authorizing Provider   OTHER VIP Nasal spray twice a day, mold exposure (vasoactive intestinal peptide).   Yes Historical Provider   armodafinil (NUVIGIL) 200 mg tab Take 1 Tab by mouth daily as needed.   Yes Historical Provider   propranolol (INDERAL) 20 mg tablet Take 20 mg by mouth daily.   Yes Historical Provider   LYRICA 300 mg capsule TAKE ONE (1) CAPSULE BY MOUTH ONCE A DAY 06/26/13  Yes Antony Haste, MD   clonazepam [facility]) 1 mg tablet Take 1 mg by mouth two (2) times a day.   Yes Historical Provider   OTHER Supplement drink   Yes Historical Provider   clomiPRAMINE (ANAFRANIL) 50 mg capsule Take 3 Caps by mouth daily. 12/04/12  Yes Antony Haste, MD          Allergies   Allergen Reactions    ??? Latex Other (comments)     redness           ROS - per HPI        Objective:   Physical Exam   Skin:   Outer right leg there is a 34mm, irregular shaped papular with raised aspect, appears to be a scab w/ unroofed section, no bleeding, no pustule, no bullae         Visit Vitals   ??? BP 100/68   ??? Pulse 90   ??? Temp 97.7 ??F (36.5 ??C) (Oral)   ??? Resp 16   ??? Ht 5\' 11"  (1.803 m)   ??? Wt 183 lb (83 kg)   ??? SpO2 99%   ??? BMI 25.52 kg/m2         Disclaimer:  Advised him to call back or return to office if symptoms worsen/change/persist.  Discussed expected course/resolution/complications of diagnosis in detail with patient.    Medication risks/benefits/costs/interactions/alternatives discussed with patient.  He was given an after visit summary which includes diagnoses, current medications, & vitals.  He expressed understanding  with the diagnosis and plan.      Aspects of this note may have been generated using voice recognition software. Despite editing, there may be some syntax errors.       Antony Haste, MD

## 2017-02-10 ENCOUNTER — Other Ambulatory Visit (HOSPITAL_BASED_OUTPATIENT_CLINIC_OR_DEPARTMENT_OTHER): Payer: Managed Care, Other (non HMO)

## 2017-02-10 ENCOUNTER — Ambulatory Visit (HOSPITAL_BASED_OUTPATIENT_CLINIC_OR_DEPARTMENT_OTHER): Payer: Managed Care, Other (non HMO) | Admitting: Hematology & Oncology

## 2017-02-10 VITALS — BP 93/69 | HR 101 | Temp 97.8°F | Resp 19 | Wt 175.0 lb

## 2017-02-10 DIAGNOSIS — R5382 Chronic fatigue, unspecified: Secondary | ICD-10-CM | POA: Diagnosis not present

## 2017-02-10 DIAGNOSIS — D701 Agranulocytosis secondary to cancer chemotherapy: Secondary | ICD-10-CM

## 2017-02-10 DIAGNOSIS — D72818 Other decreased white blood cell count: Secondary | ICD-10-CM

## 2017-02-10 DIAGNOSIS — Z8547 Personal history of malignant neoplasm of testis: Secondary | ICD-10-CM | POA: Diagnosis not present

## 2017-02-10 DIAGNOSIS — T451X5A Adverse effect of antineoplastic and immunosuppressive drugs, initial encounter: Secondary | ICD-10-CM

## 2017-02-10 LAB — CBC WITH DIFFERENTIAL (CANCER CENTER ONLY)
BASO#: 0 10*3/uL (ref 0.0–0.2)
BASO%: 0.7 % (ref 0.0–2.0)
EOS ABS: 0 10*3/uL (ref 0.0–0.5)
EOS%: 1.5 % (ref 0.0–7.0)
HCT: 43.4 % (ref 38.7–49.9)
HEMOGLOBIN: 15 g/dL (ref 13.0–17.1)
LYMPH#: 1 10*3/uL (ref 0.9–3.3)
LYMPH%: 36.1 % (ref 14.0–48.0)
MCH: 31.9 pg (ref 28.0–33.4)
MCHC: 34.6 g/dL (ref 32.0–35.9)
MCV: 92 fL (ref 82–98)
MONO#: 0.5 10*3/uL (ref 0.1–0.9)
MONO%: 17.2 % — ABNORMAL HIGH (ref 0.0–13.0)
NEUT%: 44.5 % (ref 40.0–80.0)
NEUTROS ABS: 1.2 10*3/uL — AB (ref 1.5–6.5)
Platelets: 153 10*3/uL (ref 145–400)
RBC: 4.7 10*6/uL (ref 4.20–5.70)
RDW: 13.2 % (ref 11.1–15.7)
WBC: 2.7 10*3/uL — ABNORMAL LOW (ref 4.0–10.0)

## 2017-02-10 LAB — CMP (CANCER CENTER ONLY)
ALBUMIN: 3.9 g/dL (ref 3.3–5.5)
ALT(SGPT): 31 U/L (ref 10–47)
AST: 26 U/L (ref 11–38)
Alkaline Phosphatase: 100 U/L — ABNORMAL HIGH (ref 26–84)
BUN, Bld: 10 mg/dL (ref 7–22)
CHLORIDE: 103 meq/L (ref 98–108)
CO2: 25 meq/L (ref 18–33)
CREATININE: 1 mg/dL (ref 0.6–1.2)
Calcium: 9.4 mg/dL (ref 8.0–10.3)
Glucose, Bld: 105 mg/dL (ref 73–118)
POTASSIUM: 4.2 meq/L (ref 3.3–4.7)
Sodium: 137 mEq/L (ref 128–145)
TOTAL PROTEIN: 6.7 g/dL (ref 6.4–8.1)
Total Bilirubin: 0.7 mg/dl (ref 0.20–1.60)

## 2017-02-10 NOTE — Progress Notes (Signed)
Hematology and Oncology Follow Up Visit  Charles Lowe [id number] 04/08/1966 51 y.o. 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 2. Leukopenia  Current Therapy:    Observation     Interim History:  [name] is back for followup. We last saw him a year ago.Marland Kitchen Charles Lowe still lives in [place]. 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  Charles Lowe is still dealing with chronic fatigue syndrome. Charles Lowe has had this ever since I have known him.  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 I spent this probably is from his chemotherapy that Charles Lowe took for his testicular cancer. That probably was 17 years ago. I suppose that Charles Lowe is at the time frame in which Charles Lowe could be developing the potential for myelo dysplasia.  Charles Lowe's had no infections. Charles Lowe's had no rashes. Charles Lowe's had no change in bowel or bladder habits. Charles Lowe's had no cough.  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 Charles Lowe does have tremors. Charles Lowe is thinking about going to an academic center for a ultrasound type therapy to his deep brain.   I take care of his mom who had breast cancer. Charles Lowe is down visiting with her. She is doing okay.   His appetite is doing all right.   Overall, his performance status is ECOG 1.  Medications:  Current Outpatient Prescriptions:  .  Armodafinil 200 MG TABS, Take 200 tablets by mouth daily as needed., Disp: , Rfl:  .  clomiPRAMINE (ANAFRANIL) 50 MG capsule, Take 150 mg by mouth at bedtime., Disp: , Rfl:  .  clonazePAM (KLONOPIN) 0.5 MG tablet, Take 0.5 mg by mouth 2 (two) times daily as needed. For anxiety, Disp: , Rfl:  .  Eluxadoline 100 MG TABS, Take 100 mg by mouth., Disp: , Rfl:  .  propranolol (INDERAL) 20 MG tablet, Take 20 mg by mouth as needed. For tremors, Disp: , Rfl:  .  propranolol (INDERAL) 20 MG tablet, Take 20 mg by mouth once., Disp: , Rfl:   Allergies:  Allergies  Allergen Reactions  . Latex Dermatitis    redness CAUSES SKIN REDNESS    Past  Medical History, Surgical history, Social history, and Family History were reviewed and updated.  Review of Systems: As above  Physical Exam:  weight is 175 lb (79.4 kg). His oral temperature is 97.8 F (36.6 C). His blood pressure is 93/69 and his pulse is 101 (abnormal). His respiration is 19 and oxygen saturation is 100%.   Pale white gentleman in no obvious distress. Head and neck exam shows no ocular or oral lesions. Charles Lowe has no scleral icterus. Charles Lowe has no adenopathy in his neck. Thyroid is nonpalpable. Lungs are clear bilaterally. Cardiac exam shows a regular rate and rhythm with no murmurs, rubs or bruits. Abdomen is soft. Charles Lowe has good bowel sounds. Charles Lowe has well-healed left inguinal orchiectomy scar. There is no palpable liver or spleen. Back exam shows no tenderness over the spine ribs or hips. Extremities shows no clubbing cyanosis or edema. Skin exam shows no rashes ecchymoses or petechia. Neurological exam is nonfocal.  Lab Results  Component Value Date   WBC 2.7 (L) 02/10/2017   HGB 15.0 02/10/2017   HCT 43.4 02/10/2017   MCV 92 02/10/2017   PLT 153 02/10/2017     Chemistry      Component Value Date/Time   NA 137 02/10/2017 1019   NA 135 (L) 08/05/2016 0903   K 4.2 02/10/2017 1019  K 4.1 08/05/2016 0903   CL 103 02/10/2017 1019   CO2 25 02/10/2017 1019   CO2 22 08/05/2016 0903   BUN 10 02/10/2017 1019   BUN 13.3 08/05/2016 0903   CREATININE 1.0 02/10/2017 1019   CREATININE 0.9 08/05/2016 0903      Component Value Date/Time   CALCIUM 9.4 02/10/2017 1019   CALCIUM 9.3 08/05/2016 0903   ALKPHOS 100 (H) 02/10/2017 1019   ALKPHOS 115 08/05/2016 0903   AST 26 02/10/2017 1019   AST 26 08/05/2016 0903   ALT 31 02/10/2017 1019   ALT 31 08/05/2016 0903   BILITOT 0.70 02/10/2017 1019   BILITOT 0.33 08/05/2016 0903         Impression and Plan: Charles Lowe is 51 year old male with a past history of stage III non- seminomatous germ cell  tumor of the left testicle. Charles Lowe  did receive systemic chemotherapy with BEP for 3 cycles. Charles Lowe completed this back in 2001.  I do not see any evidence of recurrence.  I still have to worry about his white cell count. 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 I looked at his blood on the microscope. I do not see anything that looked suspicious.  I do have to worry about the possibility of myelo dysplasia.  For right now, Charles Lowe will either be back to see [place] in September or November. Charles Lowe may be going to [place] for a conference in August.  As always, it is a lot of fun to talk with him.  [name], MD 6/29/20184:36 PM

## 2017-05-09 NOTE — Telephone Encounter (Signed)
Faxed.

## 2017-05-09 NOTE — Telephone Encounter (Signed)
Patient saw Dr. Garnette Czech and will be having some testing done.  Asked that his labs from Dec 2018 be faxed to him at [facility].

## 2017-05-10 LAB — AMB EXT PSA: PSA, External: 0.7

## 2017-05-24 ENCOUNTER — Ambulatory Visit
Admit: 2017-05-24 | Discharge: 2017-05-24 | Payer: PRIVATE HEALTH INSURANCE | Attending: Internal Medicine | Primary: Internal Medicine

## 2017-05-24 DIAGNOSIS — E038 Other specified hypothyroidism: Secondary | ICD-10-CM

## 2017-05-24 MED ORDER — LEVOTHYROXINE 25 MCG TAB
25 mcg | ORAL_TABLET | Freq: Every day | ORAL | 1 refills | Status: DC
Start: 2017-05-24 — End: 2018-04-03

## 2017-05-24 NOTE — Patient Instructions (Signed)
Flank Pain: Care Instructions  Your Care Instructions  Flank pain is pain on the side of the back just below the rib cage and above the waist. It can be on one or both sides. Flank pain has many possible causes, including a kidney stone, a urinary tract infection, or back strain.  Flank pain may get better on its own. But don't ignore new symptoms, such as fever, nausea and vomiting, urination problems, pain that gets worse, and dizziness. These may be signs of a more serious problem. You may have to have tests or other treatment.  Follow-up care is a key part of your treatment and safety. Be sure to make and go to all appointments, and call your doctor if you are having problems. It's also a good idea to know your test results and keep a list of the medicines you take.  How can you care for yourself at home?  ?? Rest until you feel better.  ?? Take pain medicines exactly as directed.  ?? If the doctor gave you a prescription medicine for pain, take it as prescribed.  ?? If you are not taking a prescription pain medicine, ask your doctor if you can take an over-the-counter pain medicine, such as acetaminophen (Tylenol), ibuprofen (Advil, Motrin), or naproxen (Aleve). Read and follow all instructions on the label.  ?? If your doctor prescribed antibiotics, take them as directed. Do not stop taking them just because you feel better. You need to take the full course of antibiotics.  ?? To apply heat, put a warm water bottle, a heating pad set on low, or a warm cloth on the painful area. Do not go to sleep with a heating pad on your skin.  ?? To prevent dehydration, drink plenty of fluids, enough so that your urine is light yellow or clear like water. Choose water and other caffeine-free clear liquids until you feel better. If you have kidney, heart, or liver disease and have to limit fluids, talk with your doctor before you increase the amount of fluids you drink.  When should you call for help?   Call your doctor now or seek immediate medical care if:  ?? ?? Your flank pain gets worse.   ?? ?? You have new symptoms, such as fever, nausea, or vomiting.   ?? ?? You have symptoms of a urinary problem. For example:  ?? You have blood or pus in your urine.  ?? You have chills or body aches.  ?? It hurts to urinate.  ?? You have groin or belly pain.   ??Watch closely for changes in your health, and be sure to contact your doctor if you do not get better as expected.  Where can you learn more?  Go to [email or web address].  Enter S191 in the search box to learn more about "Flank Pain: Care Instructions."  Current as of: July 04, 2016  Content Version: 11.8  ?? 2006-2018 [employer]. Care instructions adapted under license by [employer] (which disclaims liability or warranty for this information). If you have questions about a medical condition or this instruction, always ask your healthcare professional. [place] any warranty or liability for your use of this information.

## 2017-05-24 NOTE — Progress Notes (Signed)
John Friedman is a 51 y.o. male who was seen in clinic today (05/24/2017).        Assessment & Plan:   Diagnoses and all orders for this visit:    1. Subclinical hypothyroidism- chronic issue, not sure how much this is impacting his fatigue, he reports not tolerating brand name meds but also not sure what dose he was tried on. Will start on low dose med below.  Reviewed expectations.  He will update me in a few weeks and will plan on repeating labs prior to refills.  -     levothyroxine (SYNTHROID) 25 mcg tablet; Take 1 Tab by mouth Daily (before breakfast).    2. Right flank pain- this is a new problem, symptoms are: intermittent & stable, differential dx reviewed with the patient, favor muscular.  Will check LFTs if not done by urology.  Reviewed tylenol and rest and to look for triggers.  Red flags were reviewed with the patient to RTC or notify me.     -     HEPATIC FUNCTION PANEL        Will defer to Urology regarding T-repletion. Did review the pros/cons and have requested labs.         Follow-up Disposition:  Return if symptoms worsen or fail to improve.      Subjective:   [name] was seen today for Labs and Fatigue    Since last visit did go to see urology about concerns about recurrent testicular cancer.  Work up was negative.  He has f/u with specialist tomorrow.      Patient is seen for followup of subclinical hypothyroidism.  Since last visit: no changes.  He reports in the past having tried Armour thyroid and he reports he could not tolerate it- felt awful (heart pounding).  Lab review: labs reviewed and discussed with patient.  He is asking about additional testing or medication options.  He has read a book about preventing Alzheimer and is worried about optimizing his hormone levels.      [name] is here to talk about abdominal pain.  This is a new problem and symptoms began 1 month ago and have intermittent.  Pain is on  the whole right flank to the groin in location and described as aching (feels like he has been punched).  He also reports the following symptoms: nothing.  He symptoms are aggravated by none.  He has tried nothing.   Previous work up includes: previous lab reviewed.  Abd [place] from '16 reviewed- s/p cholecystectomy and fatty liver.          Brief Labs:     Lab Results   Component Value Date/Time    Sodium 134 08/01/2016 07:41 AM    Potassium 4.3 08/01/2016 07:41 AM    Creatinine 0.99 08/01/2016 07:41 AM    Cholesterol, total 162 08/01/2016 07:41 AM    HDL Cholesterol 45 08/01/2016 07:41 AM    LDL, calculated 96 08/01/2016 07:41 AM    Triglyceride 104 08/01/2016 07:41 AM    TSH 8.490 08/01/2016 07:41 AM          Prior to Admission medications    Medication Sig Start Date End Date Taking? Authorizing Provider   OTHER VIP Nasal spray twice a day, mold exposure (vasoactive intestinal peptide).   Yes Historical Provider   propranolol (INDERAL) 20 mg tablet Take 20 mg by mouth daily.   Yes Historical Provider   LYRICA 300 mg capsule TAKE ONE (1)  CAPSULE BY MOUTH ONCE A DAY 06/26/13  Yes Antony Haste, MD   clonazepam [facility]) 1 mg tablet Take 1 mg by mouth two (2) times a day.   Yes Historical Provider   OTHER Supplement drink   Yes Historical Provider   clomiPRAMINE (ANAFRANIL) 50 mg capsule Take 3 Caps by mouth daily. 12/04/12  Yes Antony Haste, MD   armodafinil (NUVIGIL) 200 mg tab Take 1 Tab by mouth daily as needed.    Historical Provider          Allergies   Allergen Reactions   ??? Latex Other (comments)     redness           Review of Systems   Respiratory: Negative for cough and shortness of breath.    Cardiovascular: Negative for chest pain and palpitations.   Gastrointestinal: Positive for abdominal pain. Negative for blood in stool, constipation, diarrhea, melena, nausea and vomiting.           Objective:   Physical Exam   Constitutional: No distress.    Eyes: Conjunctivae are normal. No scleral icterus.   Neck: No thyromegaly present.   Cardiovascular: Regular rhythm and normal heart sounds.    No murmur heard.  Pulmonary/Chest: Effort normal and breath sounds normal. He has no wheezes. He has no rales.   Abdominal: Soft. Bowel sounds are normal. He exhibits no mass. There is no hepatosplenomegaly. There is generalized tenderness (mild). There is no rigidity and no guarding.   Lymphadenopathy:     He has no cervical adenopathy.         Visit Vitals   ??? BP (!) 86/60   ??? Pulse 90   ??? Temp 98.1 ??F (36.7 ??C) (Oral)   ??? Resp 16   ??? Ht 5\' 11"  (1.803 m)   ??? Wt 176 lb (79.8 kg)   ??? SpO2 99%   ??? BMI 24.55 kg/m2         Disclaimer:  Advised him to call back or return to office if symptoms worsen/change/persist.  Discussed expected course/resolution/complications of diagnosis in detail with patient.    Medication risks/benefits/costs/interactions/alternatives discussed with patient.  He was given an after visit summary which includes diagnoses, current medications, & vitals.  He expressed understanding with the diagnosis and plan.      Aspects of this note may have been generated using voice recognition software. Despite editing, there may be some syntax errors.       Antony Haste, MD

## 2017-05-24 NOTE — Progress Notes (Signed)
Wants to discuss thyroid. Having some soreness right side.

## 2017-07-05 NOTE — Telephone Encounter (Signed)
This encounter has been pending for over 1 year, please have it closed out.

## 2017-07-06 NOTE — Telephone Encounter (Signed)
Note closed for administrative purposes

## 2017-11-14 LAB — AMB EXT CREATININE: Creatinine, External: 0.87

## 2018-04-03 ENCOUNTER — Ambulatory Visit: Attending: Internal Medicine | Primary: Internal Medicine

## 2018-04-03 ENCOUNTER — Ambulatory Visit
Admit: 2018-04-03 | Discharge: 2018-04-03 | Payer: PRIVATE HEALTH INSURANCE | Attending: Internal Medicine | Primary: Internal Medicine

## 2018-04-03 DIAGNOSIS — Z0000 Encounter for general adult medical examination without abnormal findings: Secondary | ICD-10-CM

## 2018-04-03 NOTE — Progress Notes (Signed)
Letter sent to patient.  All labs are stable or at goal for him.  10 yr CVD risk is 2.3%.  TSH is high but stable, will defer medication

## 2018-04-03 NOTE — Progress Notes (Signed)
CPE.  No AMD.

## 2018-04-03 NOTE — ACP (Advance Care Planning) (Signed)
Advance Care Planning (ACP) Provider Note - Comprehensive     Date of ACP Conversation: 04/03/18  Persons included in Conversation:  patient  Length of ACP Conversation in minutes: <16 minutes (Non-Billable)    Authorized Media planner (if patient is incapable of making informed decisions):   Other Conservation officer, nature (e.g. Next of Kin)      He has NO advanced directive  - add't info provided.  Reviewed DNR/DNI and patient is not interested.             General ACP for ALL Patients with Decision Making Capacity:  Importance of advance care planning, including choosing a healthcare agent to communicate patient's healthcare decisions if patient lost the ability to make decisions, such as after a sudden illness or accident  Understanding of the healthcare agent role was assessed and information provided  Opportunity offered to explore how cultural, religious, spiritual, or personal beliefs would affect decisions for future care  Exploration of values, goals, and preferences if recovery is not expected, even with continued medical treatment in the event of: Imminent death or severe, permanent brain injury    For Serious or Chronic Illness:  Understanding of CPR, goals and expected outcomes, benefits and burdens discussed.  Understanding of medical condition  Information on CPR success rates provided (e.g. for CPR in hospital, survival to d/c at two weeks is 22%, for chronically ill or elderly/frail survival is less than 3%)    Interventions Provided:  Recommended communicating the plan and making copies for the healthcare agent, personal physician, and others as appropriate (e.g., health system)  Recommended review of completed ACP document annually or upon change in health status       ====Honoring Choices Invitation====    Patient was invited to [facility] on this date and given the information folder for review.    Recommended appointment with Honoring Choices facilitator for ACP  conversation regarding advance directives.    []  Yes  [x]  No  Referral sent to [facility] Choices team member or Coordinator for follow-up    []  Yes  [x]  No  Patient scheduled an appointment.        Site of Referral (type it here):  [facility]    ==============================

## 2018-04-03 NOTE — Progress Notes (Signed)
John Friedman is a 52 y.o. male who was seen in clinic today (04/03/2018) for a full physical.          Assessment & Plan:   Diagnoses and all orders for this visit:    1. Routine physical examination  -     METABOLIC PANEL, COMPREHENSIVE  -     CBC W/O DIFF  -     LIPID PANEL  -     PSA, DIAGNOSTIC (PROSTATE SPECIFIC AG)  -     TSH 3RD GENERATION    2. Advanced care planning/counseling discussion  -     FULL CODE    3. 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 H/O testicular cancer- stable, defer to specialist    5. Chronic fatigue- stable, defer to specialist    6. Essential tremor- stable, defer to specialist    7. Sleep disturbances- stable, no changes    8. 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       Follow-up and Dispositions    ?? Return in about 1 year (around 04/04/2019) for FULL PHYSICAL - 30 minutes.            ------------------------------------------------------------------------------------------    Subjective:   John Friedman is here today for a full physical.      End of Life Planning  This was discussed with him today and he has NO advanced directive  - add't info provided.  Reviewed DNR/DNI and patient is not interested.    Health Maintenance  Immunizations:    Influenza: declined   Tetanus: declined   Shingles: declined   Pneumonia: n/a   Cancer screening:     Prostate: guidelines reviewed, UTD- monitored by urologist   Colon: guidelines reviewed, up to date        Patient Care Team:  Antony Haste, MD as PCP - General (Internal Medicine)  Pat Patrick, MD (Gastroenterology)  Ronald Lobo, MD (Cardiology)  Danella Deis, MD (Psychiatry)  Otilio Connors, MD (Neurology)  Sherran Needs,  MD (Otolaryngology)  Lorelee New, MD (Allergy)  Burney Gauze (Hematology and Oncology)  Aggie Moats (Internal Medicine)  Sona Rapport (Infectious Diseases)  Micheline Rough, DPM (Podiatry)  Dina Rich, MD as Physician (Urology)       The following sections were reviewed & updated as appropriate: PMH, PSH, FH, and SH.      Patient Active Problem List   Diagnosis Code   ??? Subclinical hypothyroidism E03.9   ??? Essential tremor G25.0   ??? Chronic fatigue R53.82   ??? GERD (gastroesophageal reflux disease) K21.9   ??? H/O testicular cancer Z85.47   ??? Sleep disturbances G47.9   ??? Irritable bowel syndrome with diarrhea K58.0          Prior to Admission medications    Medication Sig Start Date End Date Taking? Authorizing Provider   propranolol (INDERAL) 20 mg tablet Take 20 mg by mouth daily.   Yes Provider, Historical   LYRICA 300 mg capsule TAKE ONE (1) CAPSULE BY MOUTH ONCE A DAY 06/26/13  Yes Antony Haste, MD   clonazepam (KLONOPIN) 1 mg tablet Take 1 mg by mouth two (2) times a day.   Yes Provider, Historical  OTHER Supplement drink   Yes Provider, Historical   clomiPRAMINE (ANAFRANIL) 50 mg capsule Take 3 Caps by mouth daily. 12/04/12  Yes Antony Haste, MD   OTHER VIP Nasal spray twice a day, mold exposure (vasoactive intestinal peptide).    Provider, Historical   armodafinil (NUVIGIL) 200 mg tab Take 1 Tab by mouth daily as needed.    Provider, Historical          Allergies   Allergen Reactions   ??? Latex Other (comments)     redness             Review of Systems   Constitutional: Positive for malaise/fatigue. Negative for chills and fever.   Respiratory: Negative for cough and shortness of breath.    Cardiovascular: Negative for chest pain and palpitations.   Gastrointestinal: Positive for diarrhea. Negative for abdominal pain, blood in stool, constipation, heartburn, nausea and vomiting.   Genitourinary:        He reports: nocturia x 1-2.  He denies: urinary hesitancy, urinary frequency,  weak stream.       Denies trouble getting or maintaining an erection.  Denies trouble with AM erections.   Musculoskeletal: Negative for joint pain and myalgias.   Neurological: Positive for tremors. Negative for tingling, focal weakness and headaches.   Endo/Heme/Allergies: Does not bruise/bleed easily.   Psychiatric/Behavioral: Negative for depression. The patient is not nervous/anxious and does not have insomnia.          Objective:   Physical Exam   Constitutional: He appears well-developed. No distress.   HENT:   Right Ear: Tympanic membrane, external ear and ear canal normal.   Left Ear: Tympanic membrane, external ear and ear canal normal.   Nose: Nose normal.   Mouth/Throat: Uvula is midline, oropharynx is clear and moist and mucous membranes are normal. No posterior oropharyngeal erythema.   Eyes: Conjunctivae and lids are normal. No scleral icterus.   Neck: Neck supple. No thyromegaly present.   Cardiovascular: Regular rhythm and normal heart sounds.   No murmur heard.  Pulses:       Dorsalis pedis pulses are 2+ on the right side, and 2+ on the left side.        Posterior tibial pulses are 2+ on the right side, and 2+ on the left side.   Pulmonary/Chest: Effort normal and breath sounds normal. He has no wheezes. He has no rales.   Abdominal: Soft. Bowel sounds are normal. He exhibits no mass. There is no hepatosplenomegaly. There is no tenderness.   Musculoskeletal: He exhibits no edema.        Cervical back: Normal.        Thoracic back: He exhibits no bony tenderness.        Lumbar back: Normal.   Lymphadenopathy:     He has no cervical adenopathy.   Neurological: He has normal strength. No sensory deficit.   Skin: No rash noted.   Psychiatric: He has a normal mood and affect. His behavior is normal.          Visit Vitals  BP 100/76   Pulse 86   Temp 97.4 ??F (36.3 ??C) (Oral)   Resp 18   Ht 5' 10.25" (1.784 m)   Wt 180 lb 6.4 oz (81.8 kg)   SpO2 99%   BMI 25.70 kg/m??          Advised him to call back or  return to office if symptoms worsen/change/persist.  Discussed expected course/resolution/complications of diagnosis  in detail with patient.    Medication risks/benefits/costs/interactions/alternatives discussed with patient.  He was given an after visit summary which includes diagnoses, current medications, & vitals.  He expressed understanding with the diagnosis and plan.      Aspects of this note may have been generated using voice recognition software. Despite editing, there may be some syntax errors.       Antony Haste, MD

## 2018-04-03 NOTE — Patient Instructions (Signed)
Well Visit, Men 50 to 65: Care Instructions  Your Care Instructions    Physical exams can help you stay healthy. Your doctor has checked your overall health and may have suggested ways to take good care of yourself. He or she also may have recommended tests. At home, you can help prevent illness with healthy eating, regular exercise, and other steps.  Follow-up care is a key part of your treatment and safety. Be sure to make and go to all appointments, and call your doctor if you are having problems. It's also a good idea to know your test results and keep a list of the medicines you take.  How can you care for yourself at home?  ?? Reach and stay at a healthy weight. This will lower your risk for many problems, such as obesity, diabetes, heart disease, and high blood pressure.  ?? Get at least 30 minutes of exercise on most days of the week. Walking is a good choice. You also may want to do other activities, such as running, swimming, cycling, or playing tennis or team sports.  ?? Do not smoke. Smoking can make health problems worse. If you need help quitting, talk to your doctor about stop-smoking programs and medicines. These can increase your chances of quitting for good.  ?? Protect your skin from too much sun. When you're outdoors from 10 a.m. to 4 p.m., stay in the shade or cover up with clothing and a hat with a wide brim. Wear sunglasses that block UV rays. Even when it's cloudy, put broad-spectrum sunscreen (SPF 30 or higher) on any exposed skin.  ?? See a dentist one or two times a year for checkups and to have your teeth cleaned.  ?? Wear a seat belt in the car.  Follow your doctor's advice about when to have certain tests. These tests can spot problems early.  ?? Cholesterol. Your doctor will tell you how often to have this done based on your overall health and other things that can increase your risk for heart attack and stroke.  ?? Blood pressure. Have your blood pressure checked during a routine doctor  visit. Your doctor will tell you how often to check your blood pressure based on your age, your blood pressure results, and other factors.  ?? Prostate exam. Talk to your doctor about whether you should have a blood test (called a PSA test) for prostate cancer. Experts recommend that you discuss the benefits and risks of the test with your doctor before you decide whether to have this test.  ?? Diabetes. Ask your doctor whether you should have tests for diabetes.  ?? Vision. Some experts recommend that you have yearly exams for glaucoma and other age-related eye problems starting at age 50.  ?? Hearing. Tell your doctor if you notice any change in your hearing. You can have tests to find out how well you hear.  ?? Colorectal cancer. Your risk for colorectal cancer gets higher as you get older. Some experts say that adults should start regular screening at age 50 and stop at age 75. Others say to start before age 50 or continue after age 75. Talk with your doctor about your risk and when to start and stop screening.  ?? Heart attack and stroke risk. At least every 4 to 6 years, you should have your risk for heart attack and stroke assessed. Your doctor uses factors such as your age, blood pressure, cholesterol, and whether you smoke or have diabetes to show   what your risk for a heart attack or stroke is over the next 10 years.  ?? Abdominal aortic aneurysm. Ask your doctor whether you should have a test to check for an aneurysm. You may need a test if you ever smoked or if your parent, brother, sister, or child has had an aneurysm.  When should you call for help?  Watch closely for changes in your health, and be sure to contact your doctor if you have any problems or symptoms that concern you.  Where can you learn more?  Go to http://www.healthwise.net/[employer].  Enter K916 in the search box to learn more about "Well Visit, Men 50 to 65: Care Instructions."  Current as of: July 27, 2017   Content Version: 12.1  ?? 2006-2019 [employer]. Care instructions adapted under license by [employer] (which disclaims liability or warranty for this information). If you have questions about a medical condition or this instruction, always ask your healthcare professional. Healthwise, Inc[employer] disclaims any warranty or liability for your use of this information.

## 2018-04-03 NOTE — Progress Notes (Signed)
John Friedman is a 52 y.o. male who was seen in clinic today (04/03/2018) for a full physical.          Assessment & Plan:   Diagnoses and all orders for this visit:    1. Routine physical examination  -     METABOLIC PANEL, COMPREHENSIVE  -     CBC W/O DIFF  -     LIPID PANEL  -     PSA, DIAGNOSTIC (PROSTATE SPECIFIC AG)  -     TSH 3RD GENERATION    2. Advanced care planning/counseling discussion  -     FULL CODE    3. 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 H/O testicular cancer- stable, defer to specialist    5. Chronic fatigue- stable, defer to specialist    6. Essential tremor- stable, defer to specialist    7. Sleep disturbances- stable, no changes    8. 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       Follow-up and Dispositions    ?? Return in about 1 year (around 04/04/2019) for FULL PHYSICAL - 30 minutes.            ------------------------------------------------------------------------------------------    Subjective:   John Friedman is here today for a full physical.      End of Life Planning  This was discussed with him today and he has NO advanced directive  - add't info provided.  Reviewed DNR/DNI and patient is not interested.    Health Maintenance  Immunizations:    Influenza: declined   Tetanus: declined   Shingles: declined   Pneumonia: n/a   Cancer screening:     Prostate: guidelines reviewed, UTD- monitored by urologist   Colon: guidelines reviewed, up to date        Patient Care Team:  Antony Haste, MD as PCP - General (Internal Medicine)  Pat Patrick, MD (Gastroenterology)  Ronald Lobo, MD (Cardiology)  Danella Deis, MD (Psychiatry)  Otilio Connors, MD (Neurology)   Sherran Needs, MD (Otolaryngology)  Lorelee New, MD (Allergy)  Burney Gauze (Hematology and Oncology)  Aggie Moats (Internal Medicine)  Sona Rapport (Infectious Diseases)  Micheline Rough, DPM (Podiatry)  Dina Rich, MD as Physician (Urology)       The following sections were reviewed & updated as appropriate: PMH, PSH, FH, and SH.      Patient Active Problem List   Diagnosis Code   ??? Subclinical hypothyroidism E03.9   ??? Essential tremor G25.0   ??? Chronic fatigue R53.82   ??? GERD (gastroesophageal reflux disease) K21.9   ??? H/O testicular cancer Z85.47   ??? Sleep disturbances G47.9   ??? Irritable bowel syndrome with diarrhea K58.0          Prior to Admission medications    Medication Sig Start Date End Date Taking? Authorizing Provider   propranolol (INDERAL) 20 mg tablet Take 20 mg by mouth daily.   Yes Provider, Historical   LYRICA 300 mg capsule TAKE ONE (1) CAPSULE BY MOUTH ONCE A DAY 06/26/13  Yes Antony Haste, MD   clonazepam (KLONOPIN) 1 mg tablet Take 1 mg by mouth two (2) times a day.   Yes Provider, Historical  OTHER Supplement drink   Yes Provider, Historical   clomiPRAMINE (ANAFRANIL) 50 mg capsule Take 3 Caps by mouth daily. 12/04/12  Yes Antony Haste, MD   OTHER VIP Nasal spray twice a day, mold exposure (vasoactive intestinal peptide).    Provider, Historical   armodafinil (NUVIGIL) 200 mg tab Take 1 Tab by mouth daily as needed.    Provider, Historical          Allergies   Allergen Reactions   ??? Latex Other (comments)     redness             Review of Systems   Constitutional: Positive for malaise/fatigue. Negative for chills and fever.   Respiratory: Negative for cough and shortness of breath.    Cardiovascular: Negative for chest pain and palpitations.   Gastrointestinal: Positive for diarrhea. Negative for abdominal pain, blood in stool, constipation, heartburn, nausea and vomiting.   Genitourinary:         He reports: nocturia x 1-2.  He denies: urinary hesitancy, urinary frequency, weak stream.       Denies trouble getting or maintaining an erection.  Denies trouble with AM erections.   Musculoskeletal: Negative for joint pain and myalgias.   Neurological: Positive for tremors. Negative for tingling, focal weakness and headaches.   Endo/Heme/Allergies: Does not bruise/bleed easily.   Psychiatric/Behavioral: Negative for depression. The patient is not nervous/anxious and does not have insomnia.          Objective:   Physical Exam   Constitutional: He appears well-developed. No distress.   HENT:   Right Ear: Tympanic membrane, external ear and ear canal normal.   Left Ear: Tympanic membrane, external ear and ear canal normal.   Nose: Nose normal.   Mouth/Throat: Uvula is midline, oropharynx is clear and moist and mucous membranes are normal. No posterior oropharyngeal erythema.   Eyes: Conjunctivae and lids are normal. No scleral icterus.   Neck: Neck supple. No thyromegaly present.   Cardiovascular: Regular rhythm and normal heart sounds.   No murmur heard.  Pulses:       Dorsalis pedis pulses are 2+ on the right side, and 2+ on the left side.        Posterior tibial pulses are 2+ on the right side, and 2+ on the left side.   Pulmonary/Chest: Effort normal and breath sounds normal. He has no wheezes. He has no rales.   Abdominal: Soft. Bowel sounds are normal. He exhibits no mass. There is no hepatosplenomegaly. There is no tenderness.   Musculoskeletal: He exhibits no edema.        Cervical back: Normal.        Thoracic back: He exhibits no bony tenderness.        Lumbar back: Normal.   Lymphadenopathy:     He has no cervical adenopathy.   Neurological: He has normal strength. No sensory deficit.   Skin: No rash noted.   Psychiatric: He has a normal mood and affect. His behavior is normal.          Visit Vitals  BP 100/76   Pulse 86   Temp 97.4 ??F (36.3 ??C) (Oral)   Resp 18   Ht 5' 10.25" (1.784 m)    Wt 180 lb 6.4 oz (81.8 kg)   SpO2 99%   BMI 25.70 kg/m??          Advised him to call back or return to office if symptoms worsen/change/persist.  Discussed expected course/resolution/complications of diagnosis  in detail with patient.    Medication risks/benefits/costs/interactions/alternatives discussed with patient.  He was given an after visit summary which includes diagnoses, current medications, & vitals.  He expressed understanding with the diagnosis and plan.      Aspects of this note may have been generated using voice recognition software. Despite editing, there may be some syntax errors.       Antony Haste, MD

## 2018-04-03 NOTE — ACP (Advance Care Planning) (Signed)
Advance Care Planning (ACP) Provider Note - Comprehensive     Date of ACP Conversation: 04/03/18  Persons included in Conversation:  patient  Length of ACP Conversation in minutes: <16 minutes (Non-Billable)    Authorized Media planner (if patient is incapable of making informed decisions):   Other Conservation officer, nature (e.g. Next of Kin)      He has NO advanced directive  - add't info provided.  Reviewed DNR/DNI and patient is not interested.             General ACP for ALL Patients with Decision Making Capacity:  Importance of advance care planning, including choosing a healthcare agent to communicate patient's healthcare decisions if patient lost the ability to make decisions, such as after a sudden illness or accident  Understanding of the healthcare agent role was assessed and information provided  Opportunity offered to explore how cultural, religious, spiritual, or personal beliefs would affect decisions for future care  Exploration of values, goals, and preferences if recovery is not expected, even with continued medical treatment in the event of: Imminent death or severe, permanent brain injury    For Serious or Chronic Illness:  Understanding of CPR, goals and expected outcomes, benefits and burdens discussed.  Understanding of medical condition  Information on CPR success rates provided (e.g. for CPR in hospital, survival to d/c at two weeks is 22%, for chronically ill or elderly/frail survival is less than 3%)    Interventions Provided:  Recommended communicating the plan and making copies for the healthcare agent, personal physician, and others as appropriate (e.g., health system)  Recommended review of completed ACP document annually or upon change in health status       ====Honoring Choices Invitation====    Patient was invited to [facility] on this date and given the information folder for review.    Recommended appointment with Honoring Choices facilitator for ACP  conversation regarding advance directives.    []  Yes  [x]  No  Referral sent to [facility] Choices team member or Coordinator for follow-up    []  Yes  [x]  No  Patient scheduled an appointment.        Site of Referral (type it here):  [facility]    ==============================

## 2018-04-10 LAB — CBC W/O DIFF
HCT: 41.7 % (ref 37.5–51.0)
HGB: 14.2 g/dL (ref 13.0–17.7)
MCH: 31 pg (ref 26.6–33.0)
MCHC: 34.1 g/dL (ref 31.5–35.7)
MCV: 91 fL (ref 79–97)
PLATELET: 168 10*3/uL (ref 150–450)
RBC: 4.58 x10E6/uL (ref 4.14–5.80)
RDW: 13 % (ref 12.3–15.4)
WBC: 4 10*3/uL (ref 3.4–10.8)

## 2018-04-10 LAB — METABOLIC PANEL, COMPREHENSIVE
A-G Ratio: 2 (ref 1.2–2.2)
ALT (SGPT): 33 IU/L (ref 0–44)
AST (SGOT): 26 IU/L (ref 0–40)
Albumin: 4.3 g/dL (ref 3.5–5.5)
Alk. phosphatase: 109 IU/L (ref 39–117)
BUN/Creatinine ratio: 13 (ref 9–20)
BUN: 13 mg/dL (ref 6–24)
Bilirubin, total: 0.3 mg/dL (ref 0.0–1.2)
CO2: 23 mmol/L (ref 20–29)
Calcium: 9.3 mg/dL (ref 8.7–10.2)
Chloride: 103 mmol/L (ref 96–106)
Creatinine: 1.04 mg/dL (ref 0.76–1.27)
GFR est AA: 94 mL/min/{1.73_m2} (ref >59)
GFR est non-AA: 82 mL/min/{1.73_m2} (ref >59)
GLOBULIN, TOTAL: 2.1 g/dL (ref 1.5–4.5)
Glucose: 97 mg/dL (ref 65–99)
Potassium: 4.6 mmol/L (ref 3.5–5.2)
Protein, total: 6.4 g/dL (ref 6.0–8.5)
Sodium: 140 mmol/L (ref 134–144)

## 2018-04-10 LAB — TSH 3RD GENERATION
TSH: 6.79 u[IU]/mL — ABNORMAL HIGH (ref 0.450–4.500)
TSH: 6.79 u[IU]/mL — ABNORMAL HIGH (ref 0.450–4.500)

## 2018-04-10 LAB — LIPID PANEL
Cholesterol, Total: 184 mg/dL (ref 100–199)
Cholesterol, total: 184 mg/dL (ref 100–199)
HDL Cholesterol: 52 mg/dL (ref >39)
HDL: 52 mg/dL (ref >39)
LDL Calculated: 120 mg/dL — ABNORMAL HIGH (ref 0–99)
LDL, calculated: 120 mg/dL — ABNORMAL HIGH (ref 0–99)
Triglyceride: 59 mg/dL (ref 0–149)
Triglycerides: 59 mg/dL (ref 0–149)
VLDL Cholesterol Calculated: 12 mg/dL (ref 5–40)
VLDL, calculated: 12 mg/dL (ref 5–40)

## 2018-04-10 LAB — PSA, DIAGNOSTIC (PROSTATE SPECIFIC AG): Prostate Specific Ag: 0.7 ng/mL (ref 0.0–4.0)

## 2018-04-10 LAB — COMPREHENSIVE METABOLIC PANEL
ALT: 33 IU/L (ref 0–44)
AST: 26 IU/L (ref 0–40)
Albumin/Globulin Ratio: 2 NA (ref 1.2–2.2)
Albumin: 4.3 g/dL (ref 3.5–5.5)
Alkaline Phosphatase: 109 IU/L (ref 39–117)
BUN: 13 mg/dL (ref 6–24)
Bun/Cre Ratio: 13 NA (ref 9–20)
CO2: 23 mmol/L (ref 20–29)
Calcium: 9.3 mg/dL (ref 8.7–10.2)
Chloride: 103 mmol/L (ref 96–106)
Creatinine: 1.04 mg/dL (ref 0.76–1.27)
EGFR IF NonAfrican American: 82 mL/min/{1.73_m2} (ref >59)
GFR African American: 94 mL/min/{1.73_m2} (ref >59)
Globulin, Total: 2.1 g/dL (ref 1.5–4.5)
Glucose: 97 mg/dL (ref 65–99)
Potassium: 4.6 mmol/L (ref 3.5–5.2)
Sodium: 140 mmol/L (ref 134–144)
Total Bilirubin: 0.3 mg/dL (ref 0.0–1.2)
Total Protein: 6.4 g/dL (ref 6.0–8.5)

## 2018-04-10 LAB — CBC
Hematocrit: 41.7 % (ref 37.5–51.0)
Hemoglobin: 14.2 g/dL (ref 13.0–17.7)
MCH: 31 pg (ref 26.6–33.0)
MCHC: 34.1 g/dL (ref 31.5–35.7)
MCV: 91 fL (ref 79–97)
Platelets: 168 10*3/uL (ref 150–450)
RBC: 4.58 x10E6/uL (ref 4.14–5.80)
RDW: 13 % (ref 12.3–15.4)
WBC: 4 10*3/uL (ref 3.4–10.8)

## 2018-04-10 LAB — PSA PROSTATIC SPECIFIC ANTIGEN: PSA: 0.7 ng/mL (ref 0.0–4.0)

## 2018-08-18 ENCOUNTER — Telehealth: Payer: Self-pay | Admitting: Hematology & Oncology

## 2018-08-18 NOTE — Telephone Encounter (Signed)
MEDICAL RECORDS REQUEST   REQUEST FAXED TO:  [facility] [address], [place] [address]  PHONE # [phone] FAX # [phone]  REQUEST INDEXED

## 2018-10-09 ENCOUNTER — Ambulatory Visit: Attending: Family | Primary: Internal Medicine

## 2018-10-09 ENCOUNTER — Ambulatory Visit
Admit: 2018-10-09 | Discharge: 2018-10-09 | Payer: PRIVATE HEALTH INSURANCE | Attending: Family | Primary: Internal Medicine

## 2018-10-09 DIAGNOSIS — R42 Dizziness and giddiness: Secondary | ICD-10-CM

## 2018-10-09 NOTE — Progress Notes (Signed)
Chief Complaint   Patient presents with   . Head Pain     hit head 3 x burning sensation in head, noise sensitivity, and slight headache w/ nausea.  Hx of essential tremor and balance issues per pt - leaned down to pick up something and hit head on table - washing machine lid fell on head, and dropped something on floor and hit head on table again.

## 2018-10-09 NOTE — Progress Notes (Signed)
HISTORY OF PRESENT ILLNESS  John Friedman is a 53 y.o. male. Patient presents for ongoing fatigue, tingling in top of his head and brief intermittent episodes of feeling dizzy and off-balance since hitting his head 3 times about 3 weeks ago. He leaned forward and hit his forehead on a desk twice and the washing machine lid fell into his forehead once. All injuries were in the frontal region. No bruising or swelling. No LOC. No vomiting. No headache, but states it feels like "tingling". He has not taken anything for symptoms. Patient also reports mild congestion over the past week. Dizzy spells seem to be with position changes.  Visit Vitals  BP 113/82   Pulse (!) 57   Temp 97.8 ??F (36.6 ??C) (Oral)   Resp 16   Ht 5' 10.25" (1.784 m)   Wt 183 lb (83 kg)   SpO2 99%   BMI 26.07 kg/m??       HPI    Review of Systems   Neurological: Positive for dizziness and tingling (in head).       Physical Exam  Constitutional:       Appearance: Normal appearance.   HENT:      Head: Normocephalic.      Right Ear: Ear canal and external ear normal.      Left Ear: Ear canal and external ear normal.      Ears:      Comments: Mild clear fluid noted behind both TMs     Nose:      Comments: Pale and boggy turbinates with mild clear congestion     Mouth/Throat:      Mouth: Mucous membranes are moist.      Pharynx: Oropharynx is clear.   Eyes:      Extraocular Movements: Extraocular movements intact.      Pupils: Pupils are equal, round, and reactive to light.   Skin:     General: Skin is warm and dry.   Neurological:      General: No focal deficit present.      Mental Status: He is alert and oriented to person, place, and time.      Cranial Nerves: No cranial nerve deficit.      Motor: No weakness.      Comments: Mild tremors noted-baseline for patient   Psychiatric:         Mood and Affect: Mood normal.         Behavior: Behavior normal.       No bruising or swelling of forehead  ASSESSMENT and PLAN    ICD-10-CM ICD-9-CM    1. Dizziness  due to old head trauma R42 780.4     S09.90XS 908.9      No orders of the defined types were placed in this encounter.  follow up if no improvement over the next few weeks  Consider neuro referral if no improvement  rest

## 2018-10-09 NOTE — Progress Notes (Signed)
HISTORY OF PRESENT ILLNESS  John Friedman is a 53 y.o. male. Patient presents for ongoing fatigue, tingling in top of his head and brief intermittent episodes of feeling dizzy and off-balance since hitting his head 3 times about 3 weeks ago. He leaned forward and hit his forehead on a desk twice and the washing machine lid fell into his forehead once. All injuries were in the frontal region. No bruising or swelling. No LOC. No vomiting. No headache, but states it feels like "tingling". He has not taken anything for symptoms. Patient also reports mild congestion over the past week. Dizzy spells seem to be with position changes.  Visit Vitals  BP 113/82   Pulse (!) 57   Temp 97.8 ??F (36.6 ??C) (Oral)   Resp 16   Ht 5' 10.25" (1.784 m)   Wt 183 lb (83 kg)   SpO2 99%   BMI 26.07 kg/m??       HPI    Review of Systems   Neurological: Positive for dizziness and tingling (in head).       Physical Exam  Constitutional:       Appearance: Normal appearance.   HENT:      Head: Normocephalic.      Right Ear: Ear canal and external ear normal.      Left Ear: Ear canal and external ear normal.      Ears:      Comments: Mild clear fluid noted behind both TMs     Nose:      Comments: Pale and boggy turbinates with mild clear congestion     Mouth/Throat:      Mouth: Mucous membranes are moist.      Pharynx: Oropharynx is clear.   Eyes:      Extraocular Movements: Extraocular movements intact.      Pupils: Pupils are equal, round, and reactive to light.   Skin:     General: Skin is warm and dry.   Neurological:      General: No focal deficit present.      Mental Status: He is alert and oriented to person, place, and time.      Cranial Nerves: No cranial nerve deficit.      Motor: No weakness.      Comments: Mild tremors noted-baseline for patient   Psychiatric:         Mood and Affect: Mood normal.         Behavior: Behavior normal.       No bruising or swelling of forehead  ASSESSMENT and PLAN    ICD-10-CM ICD-9-CM     1. Dizziness due to old head trauma R42 780.4     S09.90XS 908.9      No orders of the defined types were placed in this encounter.  follow up if no improvement over the next few weeks  Consider neuro referral if no improvement  rest

## 2018-10-09 NOTE — Progress Notes (Signed)
Chief Complaint   Patient presents with   ??? Head Pain     hit head 3 x burning sensation in head, noise sensitivity, and slight headache w/ nausea.  Hx of essential tremor and balance issues per pt - leaned down to pick up something and hit head on table - washing machine lid fell on head, and dropped something on floor and hit head on table again.

## 2019-01-10 NOTE — Telephone Encounter (Signed)
Patient called and said that he has been feeling dizzy and nauseous the whole week. It has gotten worse today. He doesn't know if he has a fever but says that he feels warm. I told him that we can do a TM visit today but if he has a fever he may need to go to a urgent care facility. Patient states that he will rest this morning and go this afternoon if he is still feeling bad.

## 2019-01-12 LAB — AMB EXT URINE MICROALBUMIN: Urine Microalbumin, External: 79

## 2019-01-12 LAB — AMB EXT URINE ALBUMIN: MICROALBUMIN, URINE, EXTERNAL: 79 NA

## 2019-01-15 LAB — AMB EXT PSA
PSA, External: 0.5
PSA, External: 0.5 NA

## 2019-01-20 NOTE — Telephone Encounter (Signed)
ON CALL NOTE:  Pt reports discharged from HD 8 days ago for ARF due to urinary retention.  Is on HD.  Started w/ dry cough, minimal, the other day.  At night, when laying in certain positions willl have some SOB.  No PND or LE edema.  No SOB/DOE during the day.  Getting HD on M/W/F.  He is concerned about COVID, reports not being tested in ER.    Instructed to monitor.  Reassess after HD tomorrow.  If still having issues reviewed being evaluated at [facility].      He will call to schedule TCM in 1-2 wks.  Will get D/C summary for review.  He was asking about a 2nd opinion regarding nephrologist.    Red flags were reviewed & discussed with the patient to notify me or go to the ED.  He verbalized understanding.

## 2019-01-21 ENCOUNTER — Emergency Department: Admit: 2019-01-21 | Payer: PRIVATE HEALTH INSURANCE | Primary: Internal Medicine

## 2019-01-21 ENCOUNTER — Inpatient Hospital Stay
Admit: 2019-01-21 | Discharge: 2019-01-22 | Disposition: A | Discharge status: Home / Self Care | Payer: PRIVATE HEALTH INSURANCE | Attending: Internal Medicine | Admitting: Internal Medicine

## 2019-01-21 DIAGNOSIS — J90 Pleural effusion, not elsewhere classified: Principal | ICD-10-CM

## 2019-01-21 LAB — METABOLIC PANEL, COMPREHENSIVE
A-G Ratio: 1.2 (ref 1.1–2.2)
ALT (SGPT): 25 U/L (ref 12–78)
AST (SGOT): 20 U/L (ref 15–37)
Albumin: 3.3 g/dL — ABNORMAL LOW (ref 3.5–5.0)
Alk. phosphatase: 86 U/L (ref 45–117)
Anion gap: 13 mmol/L (ref 5–15)
BUN/Creatinine ratio: 5 — ABNORMAL LOW (ref 12–20)
BUN: 48 MG/DL — ABNORMAL HIGH (ref 6–20)
Bilirubin, total: 0.4 MG/DL (ref 0.2–1.0)
CO2: 25 mmol/L (ref 21–32)
Calcium: 7.5 MG/DL — ABNORMAL LOW (ref 8.5–10.1)
Chloride: 93 mmol/L — ABNORMAL LOW (ref 97–108)
Creatinine: 9.46 MG/DL — ABNORMAL HIGH (ref 0.70–1.30)
GFR est AA: 7 mL/min/{1.73_m2} — ABNORMAL LOW (ref >60)
GFR est non-AA: 6 mL/min/{1.73_m2} — ABNORMAL LOW (ref >60)
Globulin: 2.8 g/dL (ref 2.0–4.0)
Glucose: 84 mg/dL (ref 65–100)
Potassium: 3.9 mmol/L (ref 3.5–5.1)
Protein, total: 6.1 g/dL — ABNORMAL LOW (ref 6.4–8.2)
Sodium: 131 mmol/L — ABNORMAL LOW (ref 136–145)

## 2019-01-21 LAB — LACTIC ACID
Lactic Acid: 0.6 MMOL/L (ref 0.4–2.0)
Lactic acid: 0.6 MMOL/L (ref 0.4–2.0)

## 2019-01-21 LAB — EKG, 12 LEAD, INITIAL
Atrial Rate: 79 {beats}/min
Calculated P Axis: 47 degrees
Calculated R Axis: 33 degrees
Calculated T Axis: 55 degrees
Diagnosis: NORMAL
P-R Interval: 176 ms
Q-T Interval: 436 ms
QRS Duration: 86 ms
QTC Calculation (Bezet): 499 ms
Ventricular Rate: 79 {beats}/min

## 2019-01-21 LAB — CBC WITH AUTOMATED DIFF
ABS. BASOPHILS: 0 10*3/uL (ref 0.0–0.1)
ABS. EOSINOPHILS: 0.2 10*3/uL (ref 0.0–0.4)
ABS. IMM. GRANS.: 0 10*3/uL (ref 0.00–0.04)
ABS. LYMPHOCYTES: 0.5 10*3/uL — ABNORMAL LOW (ref 0.8–3.5)
ABS. MONOCYTES: 0.5 10*3/uL (ref 0.0–1.0)
ABS. NEUTROPHILS: 3.6 10*3/uL (ref 1.8–8.0)
ABSOLUTE NRBC: 0 10*3/uL (ref 0.00–0.01)
BASOPHILS: 1 % (ref 0–1)
EOSINOPHILS: 4 % (ref 0–7)
HCT: 23.6 % — ABNORMAL LOW (ref 36.6–50.3)
HGB: 7.6 g/dL — ABNORMAL LOW (ref 12.1–17.0)
IMMATURE GRANULOCYTES: 0 % (ref 0.0–0.5)
LYMPHOCYTES: 10 % — ABNORMAL LOW (ref 12–49)
MCH: 30.2 PG (ref 26.0–34.0)
MCHC: 32.2 g/dL (ref 30.0–36.5)
MCV: 93.7 FL (ref 80.0–99.0)
MONOCYTES: 11 % (ref 5–13)
MPV: 11.1 FL (ref 8.9–12.9)
NEUTROPHILS: 74 % (ref 32–75)
NRBC: 0 PER 100 WBC
PLATELET: 129 10*3/uL — ABNORMAL LOW (ref 150–400)
RBC: 2.52 M/uL — ABNORMAL LOW (ref 4.10–5.70)
RDW: 12.4 % (ref 11.5–14.5)
WBC: 4.8 10*3/uL (ref 4.1–11.1)

## 2019-01-21 LAB — D DIMER: D-dimer: 2.25 mg/L FEU — ABNORMAL HIGH (ref 0.00–0.65)

## 2019-01-21 LAB — PTT: aPTT: 31.8 s (ref 22.1–32.0)

## 2019-01-21 LAB — FIBRINOGEN
Fibrinogen: 394 mg/dL (ref 200–475)
Fibrinogen: 394 mg/dL (ref 200–475)

## 2019-01-21 LAB — PROCALCITONIN
Procalcitonin: 0.07 ng/mL
Procalcitonin: 0.07 ng/mL

## 2019-01-21 LAB — PROTHROMBIN TIME + INR
INR: 1.1 (ref 0.9–1.1)
INR: 1.1 (ref 0.9–1.1)
Prothrombin time: 10.9 s (ref 9.0–11.1)
Prothrombin time: 10.9 s (ref 9.0–11.1)

## 2019-01-21 LAB — TROPONIN I: Troponin-I, Qt.: <0.05 ng/mL (ref <0.05)

## 2019-01-21 LAB — TSH 3RD GENERATION
TSH: 20.7 u[IU]/mL — ABNORMAL HIGH (ref 0.36–3.74)
TSH: 20.7 u[IU]/mL — ABNORMAL HIGH (ref 0.36–3.74)

## 2019-01-21 LAB — T4, FREE
T4 Free: 0.9 NG/DL (ref 0.8–1.5)
T4, Free: 0.9 NG/DL (ref 0.8–1.5)

## 2019-01-21 LAB — FERRITIN
Ferritin: 215 NG/ML (ref 26–388)
Ferritin: 215 NG/ML (ref 26–388)

## 2019-01-21 LAB — LD: LD: 303 U/L — ABNORMAL HIGH (ref 85–241)

## 2019-01-21 LAB — SAMPLES BEING HELD: SAMPLES BEING HELD: 1

## 2019-01-21 LAB — COMPREHENSIVE METABOLIC PANEL
ALT: 25 U/L (ref 12–78)
AST: 20 U/L (ref 15–37)
Albumin/Globulin Ratio: 1.2 (ref 1.1–2.2)
Albumin: 3.3 g/dL — ABNORMAL LOW (ref 3.5–5.0)
Alkaline Phosphatase: 86 U/L (ref 45–117)
Anion Gap: 13 mmol/L (ref 5–15)
BUN: 48 MG/DL — ABNORMAL HIGH (ref 6–20)
Bun/Cre Ratio: 5 — ABNORMAL LOW (ref 12–20)
CO2: 25 mmol/L (ref 21–32)
Calcium: 7.5 MG/DL — ABNORMAL LOW (ref 8.5–10.1)
Chloride: 93 mmol/L — ABNORMAL LOW (ref 97–108)
Creatinine: 9.46 MG/DL — ABNORMAL HIGH (ref 0.70–1.30)
EGFR IF NonAfrican American: 6 mL/min/{1.73_m2} — ABNORMAL LOW (ref >60)
GFR African American: 7 mL/min/{1.73_m2} — ABNORMAL LOW (ref >60)
Globulin: 2.8 g/dL (ref 2.0–4.0)
Glucose: 84 mg/dL (ref 65–100)
Potassium: 3.9 mmol/L (ref 3.5–5.1)
Sodium: 131 mmol/L — ABNORMAL LOW (ref 136–145)
Total Bilirubin: 0.4 MG/DL (ref 0.2–1.0)
Total Protein: 6.1 g/dL — ABNORMAL LOW (ref 6.4–8.2)

## 2019-01-21 LAB — EKG 12-LEAD
Atrial Rate: 79 {beats}/min
Diagnosis: NORMAL
P Axis: 47 degrees
P-R Interval: 176 ms
Q-T Interval: 436 ms
QRS Duration: 86 ms
QTc Calculation (Bazett): 499 ms
R Axis: 33 degrees
T Axis: 55 degrees
Ventricular Rate: 79 {beats}/min

## 2019-01-21 LAB — CBC WITH AUTO DIFFERENTIAL
Basophils %: 1 % (ref 0–1)
Basophils Absolute: 0 10*3/uL (ref 0.0–0.1)
Eosinophils %: 4 % (ref 0–7)
Eosinophils Absolute: 0.2 10*3/uL (ref 0.0–0.4)
Granulocyte Absolute Count: 0 10*3/uL (ref 0.00–0.04)
Hematocrit: 23.6 % — ABNORMAL LOW (ref 36.6–50.3)
Hemoglobin: 7.6 g/dL — ABNORMAL LOW (ref 12.1–17.0)
Immature Granulocytes: 0 % (ref 0.0–0.5)
Lymphocytes %: 10 % — ABNORMAL LOW (ref 12–49)
Lymphocytes Absolute: 0.5 10*3/uL — ABNORMAL LOW (ref 0.8–3.5)
MCH: 30.2 PG (ref 26.0–34.0)
MCHC: 32.2 g/dL (ref 30.0–36.5)
MCV: 93.7 FL (ref 80.0–99.0)
MPV: 11.1 FL (ref 8.9–12.9)
Monocytes %: 11 % (ref 5–13)
Monocytes Absolute: 0.5 10*3/uL (ref 0.0–1.0)
NRBC Absolute: 0 10*3/uL (ref 0.00–0.01)
Neutrophils %: 74 % (ref 32–75)
Neutrophils Absolute: 3.6 10*3/uL (ref 1.8–8.0)
Nucleated RBCs: 0 PER 100 WBC
Platelets: 129 10*3/uL — ABNORMAL LOW (ref 150–400)
RBC: 2.52 M/uL — ABNORMAL LOW (ref 4.10–5.70)
RDW: 12.4 % (ref 11.5–14.5)
WBC: 4.8 10*3/uL (ref 4.1–11.1)

## 2019-01-21 LAB — PROTIME-INR
INR: 1.1 (ref 0.9–1.1)
INR: 1.1 (ref 0.9–1.1)
Protime: 10.9 s (ref 9.0–11.1)
Protime: 10.9 s (ref 9.0–11.1)

## 2019-01-21 LAB — TROPONIN: Troponin I: <0.05 ng/mL (ref <0.05)

## 2019-01-21 LAB — LACTATE DEHYDROGENASE: LD: 303 U/L — ABNORMAL HIGH (ref 85–241)

## 2019-01-21 LAB — APTT: aPTT: 31.8 s (ref 22.1–32.0)

## 2019-01-21 LAB — D-DIMER, QUANTITATIVE: D-Dimer, Quant: 2.25 mg/L FEU — ABNORMAL HIGH (ref 0.00–0.65)

## 2019-01-21 MED ORDER — SODIUM CHLORIDE 0.9 % IV
500 mg | INTRAVENOUS | Status: DC
Start: 2019-01-21 — End: 2019-01-21

## 2019-01-21 MED ORDER — HEPARIN (PORCINE) 1,000 UNIT/ML IJ SOLN
1000 unit/mL | INTRAMUSCULAR | Status: DC | PRN
Start: 2019-01-21 — End: 2019-01-22
  Administered 2019-01-22: 02:00:00 via ARTERIOVENOUS_FISTULA

## 2019-01-21 MED ORDER — SODIUM CHLORIDE 0.9 % IJ SYRG
Freq: Once | INTRAMUSCULAR | Status: AC
Start: 2019-01-21 — End: 2019-01-21
  Administered 2019-01-21: 14:00:00 via INTRAVENOUS

## 2019-01-21 MED ORDER — ADV ADDAPTOR
500 mg | Status: DC
Start: 2019-01-21 — End: 2019-01-22
  Administered 2019-01-21 – 2019-01-22 (×2): via INTRAVENOUS

## 2019-01-21 MED ORDER — ACETAMINOPHEN 325 MG TABLET
325 mg | ORAL | Status: DC | PRN
Start: 2019-01-21 — End: 2019-01-22

## 2019-01-21 MED ORDER — IOPAMIDOL 76 % IV SOLN
370 mg iodine /mL (76 %) | Freq: Once | INTRAVENOUS | Status: AC
Start: 2019-01-21 — End: 2019-01-21
  Administered 2019-01-21: 14:00:00 via INTRAVENOUS

## 2019-01-21 MED ORDER — SODIUM CHLORIDE 0.9 % IJ SYRG
Freq: Three times a day (TID) | INTRAMUSCULAR | Status: DC
Start: 2019-01-21 — End: 2019-01-22
  Administered 2019-01-21 – 2019-01-22 (×4): via INTRAVENOUS

## 2019-01-21 MED ORDER — ADV ADDAPTOR
1 gram | Status: DC
Start: 2019-01-21 — End: 2019-01-22
  Administered 2019-01-21: 19:00:00 via INTRAVENOUS

## 2019-01-21 MED ORDER — LEVOFLOXACIN IN D5W 750 MG/150 ML IV PIGGY BACK
750 mg/150 mL | INTRAVENOUS | Status: AC
Start: 2019-01-21 — End: 2019-01-21
  Administered 2019-01-21: 16:00:00 via INTRAVENOUS

## 2019-01-21 MED ORDER — SODIUM CHLORIDE 0.9 % IJ SYRG
INTRAMUSCULAR | Status: DC | PRN
Start: 2019-01-21 — End: 2019-01-22

## 2019-01-21 MED ORDER — LEVOFLOXACIN 500 MG TAB
500 mg | ORAL_TABLET | Freq: Every day | ORAL | 0 refills | Status: DC
Start: 2019-01-21 — End: 2019-01-21

## 2019-01-21 MED ORDER — EPOETIN ALFA-EPBX 10,000 UNIT/ML INJECTION SOLUTION
10000 unit/mL | INTRAMUSCULAR | Status: DC
Start: 2019-01-21 — End: 2019-01-22
  Administered 2019-01-22: 02:00:00 via SUBCUTANEOUS

## 2019-01-21 MED ORDER — SODIUM CHLORIDE 0.9% BOLUS IV
0.9 % | Freq: Once | INTRAVENOUS | Status: AC
Start: 2019-01-21 — End: 2019-01-21
  Administered 2019-01-21: 14:00:00 via INTRAVENOUS

## 2019-01-21 MED FILL — NORMAL SALINE FLUSH 0.9 % INJECTION SYRINGE: INTRAMUSCULAR | Qty: 10

## 2019-01-21 MED FILL — AZITHROMYCIN 500 MG IV SOLUTION: 500 mg | INTRAVENOUS | Qty: 5

## 2019-01-21 MED FILL — MONOJECT PREFILL ADVANCED 0.9 % SODIUM CHLORIDE INJECTION SYRINGE: INTRAMUSCULAR | Qty: 40

## 2019-01-21 MED FILL — CEFTRIAXONE 1 GRAM SOLUTION FOR INJECTION: 1 gram | INTRAMUSCULAR | Qty: 1

## 2019-01-21 MED FILL — LEVOFLOXACIN IN D5W 750 MG/150 ML IV PIGGY BACK: 750 mg/150 mL | INTRAVENOUS | Qty: 150

## 2019-01-21 MED FILL — NORMAL SALINE FLUSH 0.9 % INJECTION SYRINGE: INTRAMUSCULAR | Qty: 30

## 2019-01-21 MED FILL — ISOVUE-370  76 % INTRAVENOUS SOLUTION: 370 mg iodine /mL (76 %) | INTRAVENOUS | Qty: 100

## 2019-01-21 MED FILL — RETACRIT 10,000 UNIT/ML INJECTION SOLUTION: 10000 unit/mL | INTRAMUSCULAR | Qty: 2

## 2019-01-21 NOTE — Progress Notes (Signed)
Orders received, chart reviewed and patient is currently under investigation for COVID-19.  In attempts to have only essential personnel enter the room and conserve PPE, we will confer with nursing and/or the referring provider to determine the most appropriate timing of our therapy intervention.  Until this time, we will follow the patient peripherally to support nursing staff on an appropriate care plan.     Thank you for your assistance.

## 2019-01-21 NOTE — Progress Notes (Signed)
Problem: Activity Intolerance  Goal: *Oxygen saturation during activity within specified parameters  Outcome: Progressing Towards Goal     Pt desat to 86% on RA. Pt now on 3 L NC. Dyspnea at exertion.

## 2019-01-21 NOTE — ED Notes (Signed)
Pt to xray.

## 2019-01-21 NOTE — ED Provider Notes (Addendum)
ED Provider Notes by Lisette Grinder, MD at 01/21/19 5852                Author: Lisette Grinder, MD  Service: EMERGENCY  Author Type: Physician       Filed: 01/23/19 1150  Date of Service: 01/21/19 0827  Status: Signed          Editor: Lisette Grinder, MD (Physician)               HPI       [name] a 53 y.o. M with PMH of GERD, tremor, CA, IBS, and AKI here with c/o shortness of breath for 4 days.  He says he was sent to ED today to obtain a CXR and r/o PNA and PE.  Pt was recently admitted to [facility] 2/2 to AKI that was due to enlarged prostate  causing post renal failure.  He now has a foley in place until he can see urology and has dialysis M, W, F.   He is scheduled for dialysis today around 3:30pm.  In addition to dyspnea he c/o orthopnea.  No other complaints at this time.  He denies fever.       Nephrologist: Dr. Leontine Locket           Past Medical History:        Diagnosis  Date         ?  Cancer ([place])            left testicle- stage III, nonseminomatous germ cell tumor, s/p orchiectomy & BEP x 3 cycles         ?  Chronic fatigue  January 1987     ?  Essential tremor       ?  GERD (gastroesophageal reflux disease)       ?  Hypothyroid       ?  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  Lumbar radiculopathy  08/10/2012          S/p steroid injection by Dr Laverta Aubrey 08/01/12          ?  Normal cardiac stress test  09/05/11     ?  Sleep disturbances            acting out in his sleep             Past Surgical History:         Procedure  Laterality  Date          ?  HX CHOLECYSTECTOMY    12/05/2011          abnormal HIDA scan (EF < 2%)          ?  HX COLONOSCOPY    07/12/10          hyperplastic polyp, repeat 10 yrs          ?  HX COLONOSCOPY    02/23/15          normal          ?  HX CYST REMOVAL  Right  01/06/2016          3rd toe ganglion cyst removal - podiatry (Dr Raliegh Ip)          ?  HX GI    09/09/11          EGD- normal          ?  HX HEENT  Left  06/2012  tonsilar bx: benign tissue (Dr Buddy Duty)           ?  HX ORCHIECTOMY  Left  2002     ?  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  HX SHOULDER ARTHROSCOPY  Right  08/13/2018               Family History:         Problem  Relation  Age of Onset          ?  No Known Problems  Mother       ?  Parkinsonism  Father       ?  Arthritis-osteo  Father                scoliosis          ?  No Known Problems  Brother       ?  No Known Problems  Brother       ?  No Known Problems  Sister       ?  Cancer  Maternal Grandfather                prostate          ?  Cancer  Paternal Grandfather                prostate             Social History          Socioeconomic History         ?  Marital status:  SINGLE              Spouse name:  Not on file         ?  Number of children:  Not on file     ?  Years of education:  Not on file     ?  Highest education level:  Not on file       Occupational History        ?  Not on file       Social Needs         ?  Financial resource strain:  Not on file        ?  Food insecurity              Worry:  Not on file         Inability:  Not on file        ?  Transportation needs              Medical:  Not on file         Non-medical:  Not on file       Tobacco Use         ?  Smoking status:  Never Smoker     ?  Smokeless tobacco:  Never Used       Substance and Sexual Activity         ?  Alcohol use:  Yes             Comment: rarely         ?  Drug use:  No     ?  Sexual activity:  Never              Partners:  Female  Lifestyle        ?  Physical activity              Days per week:  Not on file         Minutes per session:  Not on file         ?  Stress:  Not on file       Relationships        ?  Social Health visitor on phone:  Not on file         Gets together:  Not on file         Attends religious service:  Not on file         Active member of club or organization:  Not on file         Attends meetings of clubs or organizations:  Not on file         Relationship status:  Not on file        ?   Intimate partner violence              Fear of current or ex partner:  Not on file         Emotionally abused:  Not on file         Physically abused:  Not on file         Forced sexual activity:  Not on file        Other Topics  Concern        ?  Not on file       Social History Narrative        ?  Not on file              ALLERGIES: Latex      Review of Systems    Constitutional: Negative for chills, diaphoresis and fever.    HENT: Negative for congestion and trouble swallowing.     Eyes: Negative for photophobia and visual disturbance.    Respiratory: Positive for shortness of breath. Negative for cough and chest tightness.     Cardiovascular: Negative for chest pain, palpitations and leg swelling.    Gastrointestinal: Negative for abdominal pain, diarrhea, nausea and vomiting.    Genitourinary: Negative for difficulty urinating, dysuria, flank pain and frequency.    Musculoskeletal: Negative for back pain and myalgias.    Skin: Negative for rash and wound.    Neurological: Negative for dizziness, weakness, light-headedness and headaches.    Hematological: Negative for adenopathy. Does not bruise/bleed easily.    Psychiatric/Behavioral: Negative for agitation and confusion.    All other systems reviewed and are negative.           Vitals:          01/21/19 0823        BP:  127/77     Pulse:  80     Resp:  22     Temp:  98 ??F (36.7 ??C)        SpO2:  95%                Physical Exam   Vitals signs and nursing note reviewed.   Constitutional:        General: He is not in acute distress.     Appearance: He is well-developed. He is not diaphoretic.    HENT:       Head:  Normocephalic.   Eyes :       Conjunctiva/sclera: Conjunctivae normal.      Pupils: Pupils are equal, round, and reactive to light.    Neck:       Musculoskeletal: Normal range of motion and neck supple.      Vascular: No JVD.    Cardiovascular:       Rate and Rhythm: Normal rate and regular rhythm.      Heart sounds: Normal heart sounds.    Pulmonary:        Effort: Pulmonary effort is normal.      Breath sounds: Normal breath sounds.   Chest:          Abdominal :      General: Bowel sounds are normal. There is no distension.      Palpations: Abdomen is soft.      Tenderness: There is no abdominal tenderness.     Musculoskeletal: Normal range of motion.          General: No tenderness or deformity.     Lymphadenopathy:       Cervical: No cervical adenopathy.   Skin :      General: Skin is warm and dry.      Capillary Refill: Capillary refill takes less than 2 seconds.      Findings: No erythema or rash.   Neurological:       Mental Status: He is alert and oriented to person, place, and time.      Cranial Nerves: No cranial nerve deficit.      Sensory: No sensory deficit.             MDM          Procedures      ED EKG interpretation:   Rhythm: normal sinus rhythm; and regular . Rate (approx.): 79; Axis: normal; P wave: normal; QRS interval: normal ; ST/T wave: normal;  EKG documented by Lisette Grinder, MD,  as interpreted by Lisette Grinder, MD , ED MD.      9:08 AM   Pt says he is here for r/o PNA and PE.  However, he is refusing IV at this time.  I have advised pt I cannot rule out PE with out imaging and he will need IV for this.  He says he will consider it.  He is also stating he does not want to be in ED long  and does not think he wants to be admitted.  He wants to make his dialysis at 3:30 pm.  However, he will allow a work up for now.      CONSULT NOTE:   10:50 AM Lisette Grinder, MD spoke with Dr. Roanna Banning, Consult for Nephrology.  Discussed available diagnostic tests and clinical findings.  Dr. Roanna Banning  recommends arranging dialysis.  He agrees with admission.       10:50 AM   Pt does not want to be admitted.  He will accept antibiotics in ED.  I have advised him, he may have a hospital associated pneumonia since he was recently hospitalized and should obtain IV antibiotics until dialysis and repeat imaging can be obtained  to see if true PNA present.  However,  pt still does not want to stay.        12:18 PM   Pt hypoxic with ambulation and agrees to stay now.      Hospitalist Perfect Serve for Admission   12:18 PM      ED Room Number: ER13/13  Patient Name and age:  John Friedman 53 y.o.   male   Working Diagnosis:       1.  SOB (shortness of breath)      2.  Pleural effusion         3.  Hypervolemia, unspecified hypervolemia type            COVID-19 Suspicion:  no      Code Status:  Full Code   Readmission: no   Isolation Requirements:  no   Recommended Level of Care:  telemetry   Department:[facility] ED - [phone]   Other:  Pt with effusions and interstitial edema with recent dx of ESRD on dialysis after AKI from prostate causing obstruction.  He has foley in place with good output.  He had dialysis  this afternoon but came to day bc increase shortness of breath since Friday and orthopnea.  CTA no PE but cannot r/o PNA as well.  Antibiotics given.  Dr. Roanna Banning with nephrology to arrange dialysis.  Pt initially wanted to leave AMA and refused IV but  now has decided to stay.  He is hypoxic with ambulation also.       Lisette Grinder, MD

## 2019-01-21 NOTE — Progress Notes (Signed)
 Admission Medication Reconciliation:    Information obtained from:  Patient via telephone interview,  RX query    Comments/Recommendations: Reviewed PTA medications and patient's allergies.  Patient is an excellent historian. Expresses concern with propranolol being given too close to HD due to risk of lowering blood pressure.     Medication changes (since last review):  Added  - Cytomel - patient has not yet started taking this new medication because he wants to wait until he feels better  -Flomax    Adjusted  - clomipramine 150mg  to 50mg     Removed  - Nasal spray       [employer] pharmacy benefit data reflects medications filled and processed through the patient's insurance, however   this data does NOT capture whether the medication was picked up or is currently being taken by the patient.    Allergies:  Latex    Significant PMH/Disease States:   Past Medical History:   Diagnosis Date    Anemia 01/21/2019    ARF (acute renal failure) ([facility])     on HD    BPH (benign prostatic hyperplasia)     Cancer ([facility])     left testicle- stage III, nonseminomatous germ cell tumor, s/p orchiectomy & BEP x 3 cycles    Chronic fatigue January 1987    Essential tremor     GERD (gastroesophageal reflux disease)     H/O prolonged Q-T interval on ECG 01/21/2019    Chronic    Hyponatremia 01/21/2019    Hypothyroid     Irritable bowel syndrome with diarrhea 05/12/2015    Lumbar radiculopathy 08/10/2012    S/p steroid injection by Dr Darra 08/01/12     Normal cardiac stress test 09/05/11    Pleural effusion, bilateral 01/21/2019    Pulmonary edema 01/21/2019    Sleep disturbances     acting out in his sleep    Thrombocytopenia ([facility]) 01/21/2019     Chief Complaint for this Admission:    Chief Complaint   Patient presents with    Shortness of Breath     Prior to Admission Medications:   Prior to Admission Medications   Prescriptions Last Dose Informant Patient Reported? Taking?   OTHER 01/21/2019  Yes Yes   Sig: Supplemental drink - phosphatidylcholine with  omega 3 and 6 and dextrose - to help with tremors.   armodafinil (NUVIGIL) 200 mg tab   Yes Yes   Sig: Take 1 Tab by mouth daily as needed.   clomiPRAMINE (ANAFRANIL) 50 mg capsule 01/20/2019 at Unknown time  Yes Yes   Sig: Take 50 mg by mouth nightly.   clonazepam (KLONOPIN) 1 mg tablet 01/21/2019  Yes Yes   Sig: Take 1 mg by mouth two (2) times a day.   liothyronine (CYTOMEL) 5 mcg tablet Not Taking at Unknown time  Yes No   Sig: Take 5 mcg by mouth daily.   pregabalin (Lyrica) 300 mg capsule 01/20/2019 at Unknown time  Yes Yes   Sig: Take 300 mg by mouth nightly. For sleep disturbances - pt has had injuries from falling out of bed during sleep   propranolol (INDERAL) 20 mg tablet 01/21/2019  Yes Yes   Sig: Take 20 mg by mouth daily. For tremors   tamsulosin (Flomax) 0.4 mg capsule 01/21/2019  Yes Yes   Sig: Take 0.4 mg by mouth daily.      Facility-Administered Medications: None

## 2019-01-21 NOTE — H&P (Signed)
H&P by Orlinda Blalock, NP at 01/21/19 1337                Author: Orlinda Blalock, NP  Service: Hospitalist  Author Type: Nurse Practitioner       Filed: 01/21/19 1441  Date of Service: 01/21/19 1337  Status: Signed           Editor: Orlinda Blalock, NP (Nurse Practitioner)  Cosigner: Toy Baker, MD at 02/01/19 [facility]   History and Physical      Primary Care Provider: Antony Haste, MD   Date of Service:  01/21/2019        Subjective:        John Friedman is a 53 y.o.  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  Of note, patient reports that he was hospitalized at [facility] for approximately 8  days and got out some point last week.  He was hospitalized due to his prostate enlargement causing occlusion and subsequent acute renal failure requiring him to now be on hemodialysis.  He has a chronic Foley catheter with leg bag since that time.      Work-up in ED was remarkable for anemia with hemoglobin of 7.6, thrombocytopenia with platelets of 129, lymphocytopenia with lymphocytes of 10, hyponatremia with sodium of 131, acute renal failure with creatinine of 9.46, lactic acid was normal, CT scan  showed no evidence of PE but moderately large bilateral pleural effusions.  Also moderately severe pulmonary edema pattern and bilateral nodular airspace disease and bilateral consolidation that may indicate superimposed pneumonia.  Patient reports that  he does feel as though he has pneumonia and he has had a cough but is not "bringing anything up."  He denies fever, myalgias, dizziness or chest pain, abdominal pain, nausea vomiting or diarrhea, paresthesias, loss of  taste or sense of smell.  He was  due to have dialysis today around 3 PM.      Patient was initially given 1 dose of 750 mg of Levaquin IV in the ED and patient was going to leave [place].  He tells me that he is just frustrated because he spent 8 days in the hospital and he feels that is the reason that he feels  somewhat weak.  He does not use a walker or anything else to walk.  He is frustrated because he is on hemodialysis and does not know if his kidneys will recover.  Of note, the patient is an Marine scientist by trade Counselling psychologist) and relies heavily on details  and facts.  He was also adopted and his adopted father was an OB/GYN and he tells me that he was used to "the personal touch" and having things explained to him.  I spent a great deal of time discussing his significant comorbidities and and new need for  HD but also his frustration and desire to "just be home and eat what I want."  See ACP.  Discussed consulting palliative care and their role in decision making and goals of care.  He reports that his mother is 52 years old and she is only  family he children.   He reports that he can tell us his philosophy on what he would like to have done as far as CODE STATUS, he states he wants to "fight like hell."  He is agreeable to palliative care consult.      Discussed inpatient admission with the patient who wanted to know if he could leave after dialysis.  Explained to him in great detail that he has quite a bit of fluid around his lungs as well as pneumonia in both his lungs and pleural effusions which  are pressing on his lungs and causing him to be hypoxic.  Explained need for adequate oxygenation to all organs especially kidneys, given that he is receiving dialysis and wishes for his kidneys to return to normal function.  Explained that if he did  decide to leave [place], he would be at great risk for sudden death related to his fluid overload status as well as his pneumonia  causing hypoxia and the stress of this on his heart as well.  He reported understanding and states that this  point he will stay.  Discussed with Dr. Roanna Banning who will see the patient for nephrology. Discussed also with Dr. Army Melia.      Review of Systems:      A comprehensive review of systems was negative except for that written in the History of Present Illness.         Past Medical History:        Diagnosis  Date         ?  Anemia  01/21/2019     ?  ARF (acute renal failure) (HCC)            on HD         ?  BPH (benign prostatic hyperplasia)       ?  Cancer ([place])            left testicle- stage III, nonseminomatous germ cell tumor, s/p orchiectomy & BEP x 3 cycles         ?  Chronic fatigue  January 1987     ?  Essential tremor       ?  GERD (gastroesophageal reflux disease)       ?  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  Hyponatremia  01/21/2019     ?  Hypothyroid       ?  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  Lumbar radiculopathy  08/10/2012          S/p steroid injection by Dr Laverta Blue 08/01/12          ?  Normal cardiac stress test  09/05/11     ?  Pleural effusion, bilateral  01/21/2019     ?  Pulmonary edema  01/21/2019     ?  Sleep disturbances            acting out in his sleep         ?  Thrombocytopenia ([place])  01/21/2019           Past Surgical History:         Procedure  Laterality  Date          ?  HX CHOLECYSTECTOMY    12/05/2011          abnormal HIDA scan (  EF < 2%)          ?  HX COLONOSCOPY    07/12/10          hyperplastic polyp, repeat 10 yrs          ?  HX COLONOSCOPY    02/23/15          normal          ?  HX CYST REMOVAL  Right  01/06/2016          3rd toe ganglion cyst removal - podiatry (Dr Raliegh Ip)          ?  HX GI    09/09/11          EGD- normal          ?  HX HEENT  Left  06/2012          tonsilar bx: benign tissue (Dr Buddy Duty)          ?  HX ORCHIECTOMY  Left  2002     ?  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  HX SHOULDER  ARTHROSCOPY  Right  08/13/2018          Prior to Admission medications             Medication  Sig  Start Date  End Date  Taking?  Authorizing Provider            OTHER  VIP Nasal spray twice a day, mold exposure (vasoactive intestinal peptide).        Provider, Historical            armodafinil (NUVIGIL) 200 mg tab  Take 1 Tab by mouth daily as needed.        Provider, Historical     propranolol (INDERAL) 20 mg tablet  Take 20 mg by mouth daily.        Provider, Historical     LYRICA 300 mg capsule  TAKE ONE (1) CAPSULE BY MOUTH ONCE A DAY  06/26/13      Antony Haste, MD     clonazepam (KLONOPIN) 1 mg tablet  Take 1 mg by mouth two (2) times a day.        Provider, Historical     OTHER  Supplement drink        Provider, Historical            clomiPRAMINE (ANAFRANIL) 50 mg capsule  Take 3 Caps by mouth daily.  12/04/12      Antony Haste, MD          Allergies        Allergen  Reactions         ?  Latex  Other (comments)             redness           Family History         Problem  Relation  Age of Onset          ?  No Known Problems  Mother       ?  Parkinsonism  Father       ?  Arthritis-osteo  Father                scoliosis          ?  No  Known Problems  Brother       ?  No Known Problems  Brother       ?  No Known Problems  Sister       ?  Cancer  Maternal Grandfather                prostate          ?  Cancer  Paternal Grandfather                prostate            SOCIAL HISTORY:   Patient resides at home alone   Patient ambulates with walking   Smoking history: Never   Alcohol history: Denies           Objective:           Physical Exam:    Visit Vitals      BP  153/88     Pulse  75     Temp  97.8 ??F (36.6 ??C)     Resp  18        SpO2  94%           General:   Alert, cooperative, no distress, appears stated age     Head:   Normocephalic, without obvious abnormality, atraumatic, wears glasses     Eyes:   Conjunctivae/corneas clear. Pupils equal, round, reactive to light. Extraocular movements  intact.        Lungs:    Diminished throughout, L>R, some scattered rhonchi; no wheezing        Heart:   Regular rate and rhythm, S1, S2 normal, no murmur, click, rub, or gallop.        Abdomen:    Soft, non-tender. Bowel sounds normal. No masses. No organomegaly; chronic foley w/ leg bag     Extremities:  Extremities normal, atraumatic, 1+ pitting edema to bilateral ankles     Pulses:  2+ and symmetric all extremities.     Skin:  Skin color, texture, turgor normal. No rashes or lesions.     Neurologic:  CNII-XII intact. Normal strength throughout. A&Ox4.           ECG: Normal sinus rhythm   Prolonged QT   When compared with ECG of 01-Sep-2015 15:20,   QT has shortened       Data Review:      Recent Results (from the past 24 hour(s))     EKG, 12 LEAD, INITIAL          Collection Time: 01/21/19  8:31 AM         Result  Value  Ref Range            Ventricular Rate  79  BPM       Atrial Rate  79  BPM       P-R Interval  176  ms       QRS Duration  86  ms       Q-T Interval  436  ms       QTC Calculation (Bezet)  499  ms       Calculated P Axis  47  degrees       Calculated R Axis  33  degrees       Calculated T Axis  55  degrees       Diagnosis                 Normal sinus rhythm  Prolonged QT   When compared with ECG of 01-Sep-2015 15:20,   QT has shortened          CBC WITH AUTOMATED DIFF          Collection Time: 01/21/19  8:34 AM         Result  Value  Ref Range            WBC  4.8  4.1 - 11.1 K/uL       RBC  2.52 (L)  4.10 - 5.70 M/uL       HGB  7.6 (L)  12.1 - 17.0 g/dL       HCT  23.6 (L)  36.6 - 50.3 %       MCV  93.7  80.0 - 99.0 FL       MCH  30.2  26.0 - 34.0 PG       MCHC  32.2  30.0 - 36.5 g/dL       RDW  12.4  11.5 - 14.5 %       PLATELET  129 (L)  150 - 400 K/uL       MPV  11.1  8.9 - 12.9 FL       NRBC  0.0  0 PER 100 WBC       ABSOLUTE NRBC  0.00  0.00 - 0.01 K/uL       NEUTROPHILS  74  32 - 75 %       LYMPHOCYTES  10 (L)  12 - 49 %       MONOCYTES  11  5 - 13 %       EOSINOPHILS  4  0 - 7 %        BASOPHILS  1  0 - 1 %       IMMATURE GRANULOCYTES  0  0.0 - 0.5 %       ABS. NEUTROPHILS  3.6  1.8 - 8.0 K/UL       ABS. LYMPHOCYTES  0.5 (L)  0.8 - 3.5 K/UL       ABS. MONOCYTES  0.5  0.0 - 1.0 K/UL       ABS. EOSINOPHILS  0.2  0.0 - 0.4 K/UL       ABS. BASOPHILS  0.0  0.0 - 0.1 K/UL       ABS. IMM. GRANS.  0.0  0.00 - 0.04 K/UL       DF  SMEAR SCANNED          RBC COMMENTS  NORMOCYTIC, NORMOCHROMIC          METABOLIC PANEL, COMPREHENSIVE          Collection Time: 01/21/19  8:34 AM         Result  Value  Ref Range            Sodium  131 (L)  136 - 145 mmol/L       Potassium  3.9  3.5 - 5.1 mmol/L       Chloride  93 (L)  97 - 108 mmol/L       CO2  25  21 - 32 mmol/L       Anion gap  13  5 - 15 mmol/L       Glucose  84  65 - 100 mg/dL       BUN  48 (H)  6 - 20 MG/DL       Creatinine  9.46 (H)  0.70 -  1.30 MG/DL       BUN/Creatinine ratio  5 (L)  12 - 20         GFR est AA  7 (L)  >60 ml/min/1.77m       GFR est non-AA  6 (L)  >60 ml/min/1.770m      Calcium  7.5 (L)  8.5 - 10.1 MG/DL       Bilirubin, total  0.4  0.2 - 1.0 MG/DL       ALT (SGPT)  25  12 - 78 U/L       AST (SGOT)  20  15 - 37 U/L       Alk. phosphatase  86  45 - 117 U/L       Protein, total  6.1 (L)  6.4 - 8.2 g/dL       Albumin  3.3 (L)  3.5 - 5.0 g/dL       Globulin  2.8  2.0 - 4.0 g/dL       A-G Ratio  1.2  1.1 - 2.2         SAMPLES BEING HELD          Collection Time: 01/21/19  8:34 AM         Result  Value  Ref Range            SAMPLES BEING HELD   1 RED, 1 BLUE         COMMENT                  Add-on orders for these samples will be processed based on acceptable specimen integrity and analyte stability, which may vary by analyte.       LACTIC ACID          Collection Time: 01/21/19 10:53 AM         Result  Value  Ref Range            Lactic acid  0.6  0.4 - 2.0 MMOL/L              Chest x-ray:      CXR Results   (Last 48 hours)                                    01/21/19 0908    XR CHEST PA LAT  Final result            Impression:    IMPRESSION:  Extensive pulmonary vascular congestive changes, as described above.                              Narrative:    Indication:  SOB. Orthopnea.             Exam: PA and lateral views of the chest.             Direct comparison is made to prior CXR dated January 2017.             Findings: Cardiomediastinal silhouette is partially obscured by patchy bibasilar      airspace disease and small bilateral pleural effusions. There is also pulmonary      vascular prominence and bilateral interstitial edema. Central venous catheter      extends to the right atrium. There is no pneumothorax.  CT Results   (Last 48 hours)                                    01/21/19 1004    CTA CHEST W OR W WO CONT  Final result            Impression:    IMPRESSION:      No evidence of acute pulmonary embolus. Moderately large bilateral pleural      effusions. Moderately severe pulmonary edema pattern. Bilateral nodular airspace      disease and bilateral consolidation may indicate superimposed pneumonia;      radiographic follow-up is recommended to assure resolution.                       Narrative:    INDICATION: Shortness of breath and orthopnea. Recent hospitalization. End-stage      renal disease.             COMPARISON:PA and lateral chest radiograph from earlier today             TECHNIQUE:        Routine noncontrast imaging the chest was performed for localization purposes.      Then, following the uneventful intravenous administration of 75 cc [id number],      thin helical axial images were obtained through the chest. 3D image      postprocessing was performed. CT dose reduction was achieved through use of a      standardized protocol tailored for this examination and automatic exposure      control for dose modulation. Adaptive statistical iterative reconstruction      (ASIR) was utilized.             FINDINGS:             CHEST WALL: No chest wall mass or axillary lymphadenopathy.      THYROID: No nodule.       MEDIASTINUM: No mass or lymphadenopathy. Right internal jugular tunneled      dialysis catheter terminates at the cavoatrial junction.      HILA: No mass or lymphadenopathy.      THORACIC AORTA: No dissection or aneurysm.      PULMONARY ARTERIES: Main pulmonary artery is normal in caliber. No evidence of      acute pulmonary emboli.      TRACHEA/BRONCHI: Patent.      ESOPHAGUS: No wall thickening or dilatation.      HEART: Normal in size.      PLEURA: Moderately large bilateral pleural effusions.      LUNGS: Moderately severe pulmonary interstitial edema pattern. Consolidation in      the right middle lobe and bilateral lower lobes. Nodular airspace disease in the      upper lobes bilaterally.      INCIDENTALLY IMAGED UPPER ABDOMEN: No focal abnormality.      BONES: No destructive bone lesion.      ADDITIONAL COMMENTS: N/A                                       Assessment:        Principal Problem:     Hospital acquired PNA (01/21/2019)      Active Problems:     Subclinical hypothyroidism ()  Chronic fatigue (08/15/1985)        GERD (gastroesophageal reflux disease) ()       Overview: Normal EGD (09/09/11)        H/O testicular cancer ()       Overview: testicular cancer (Stage III)        Acute renal failure ([place]) (01/20/2019)       Overview: May '20 - due to urinary retention, seen at [facility].  HD on M/W/F.        Hyponatremia (01/21/2019)        Thrombocytopenia ([place]) (01/21/2019)        Anemia (01/21/2019)        Pleural effusion, bilateral (01/21/2019)        Pulmonary edema (01/21/2019)        History of diastolic dysfunction (12/16/6642)       Overview: Chronic              Plan:        SOB   -multifactoral: pulm edema, bilateral effusions, PNA   -O2 to keep sats greater than 92%   -HD today; Dr. Roanna Banning consulted   -COVID workup      PNA-bilateral   -recent hospitalization   -O2 to keep sats greater than 92%   -rocephin, azithromax   -sputum culture   -lactic neg; afebrile and no leukocytosis   -COVID workup       Pulmonary Edema/Pleural effusions   -unclear source   -for HD today   -Echo (prev echo in 2014 w/ grade 1 diastolic dysfunction)   -Further mgt as above      ARF on HD: d/t obstructing prostate   -due for HD today   -renal diet   -avoid nephrotoxins   -appreciate nephro input      Anemia   -likely d/t above   -epoetin w/ HD per nephro   -Monitor labs      Hyponatremia   -Suspect d/t ARF   -Monitor labs   -Appreciate nephro input      Thrombocytopenia   -Unclear source   -No s/sx bleeding   -Monitor labs      Hx subclinical hypothyroidism   -Several elevated TSHs but no hx of T3 total or T4 free   -Thyroid studies   -Hold off on starting levothyroxine for now      Chronic fatigue syndrome   -Restart home med once med rec clarified by pharm      Hx GERD   -TUMS prn for now      DVTppx: SCDs   Gippx: TUMS prn   Code Status: Full code   Diet: Renal   Activity: OOB to chair TID and PRN/ PT consult   Discharge: TBD         Signed By:  Orlinda Blalock, NP           January 21, 2019

## 2019-01-21 NOTE — ED Notes (Signed)
Report given to Michelle RN.

## 2019-01-21 NOTE — ACP (Advance Care Planning) (Signed)
ACP (Advance Care Planning) by Orlinda Blalock, NP at 01/21/19 1400                Author: Orlinda Blalock, NP  Service: Hospitalist  Author Type: Nurse Practitioner       Filed: 01/21/19 1445  Date of Service: 01/21/19 1400  Status: Signed          Editor: Orlinda Blalock, NP (Nurse Practitioner)                                                                              Advance Care Planning Note      Name: John Friedman   Date of birth: [birth year]   MRN: [id number]   Admission Date: 01/21/2019  8:27 AM      Date of discussion: 01/21/2019      Active Diagnoses:         Hospital Problems   Date Reviewed:  01/21/2019                         Codes  Class  Noted  POA              * (Principal) Hospital acquired PNA  ICD-10-CM: J18.9, Y95   ICD-9-CM: 322    01/21/2019  Yes                        Hyponatremia  ICD-10-CM: E87.1   ICD-9-CM: 276.1    01/21/2019  Yes                        Thrombocytopenia ([place])  ICD-10-CM: D69.6   ICD-9-CM: 287.5    01/21/2019  Yes                        Anemia  ICD-10-CM: D64.9   ICD-9-CM: 285.9    01/21/2019  Yes                        Pleural effusion, bilateral  ICD-10-CM: J90   ICD-9-CM: 511.9    01/21/2019  Yes                        Pulmonary edema  ICD-10-CM: J81.1   ICD-9-CM: 025    01/21/2019  Yes                        History of diastolic dysfunction  KYH-06-CB: Z86.79   ICD-9-CM: V12.59    01/21/2019  Yes          Overview Signed 01/21/2019  1:54 PM by Orlinda Blalock, NP            Chronic                                    Acute renal failure ([place])  ICD-10-CM: N17.9   ICD-9-CM: 584.9    01/20/2019  Yes          Overview  Signed 01/20/2019  8:16 PM by Antony Haste, MD            May '20 - due to urinary retention, seen at [facility].  HD on M/W/F.                                    Subclinical hypothyroidism  ICD-10-CM: E03.9   ICD-9-CM: 244.8    Unknown  Yes                        GERD (gastroesophageal reflux disease)  ICD-10-CM: K21.9   ICD-9-CM: 530.81    Unknown  Yes           Overview Signed 09/26/2011  7:16 AM by Antony Haste, MD            Normal EGD (09/09/11)                                    H/O testicular cancer  ICD-10-CM: Z85.47   ICD-9-CM: V10.47    Unknown  Yes          Overview Signed 09/19/2011  8:54 AM by Antony Haste, MD            testicular cancer (Stage III)                                    Chronic fatigue  ICD-10-CM: R53.82   ICD-9-CM: 780.79    08/15/1985  Yes                           These active diagnoses are of sufficient risk that focused discussion on advance care planning is indicated in order to allow the patient to thoughtfully consider personal goals of care, and if situations arise that prevent the ability to personally  give input, to ensure appropriate representation of their personal desires for different levels and aggressiveness of care.       Discussion:       Persons present and participating in discussion: John Friedman, Orlinda Blalock, NP      Discussion: Patient with significant medical history including testicular cancer status post chemo, subclinical hypothyroidism, chronic fatigue syndrome, grade 1 diastolic heart dysfunction and most recently acute renal failure requiring HD Monday Wednesday  and Fridays due to obstructing prostate.  Patient reports being in the hospital recently for 8 days and was discharged this past week.  He almost left AMA in the ED due to not wanting to be admitted to the hospital again.  When discussing CODE STATUS  and quality of life he stated his philosophy is "fight like hell".  At the same time he states that he is sick of a renal diet and being in the hospital and wants to eat "water I want like pizza" and be at home.  His wishes are in conflict with each other.   He reports that he has no family was never married did not have kids and that his mother is 25 and lives in [place].  When asked who he would want to make his decisions he stated "I guess you have to get a hold by mom."   Discussed advanced medical  directive and offered to fill this out, patient stated that he can do that later.  Discussed his desire to be at home and eat what he wants and how that conflicts with his desire to "fight like hell."  Patient acknowledges that these do not make sense  and that he is very impatient for results.  Discussed bringing in palliative care to help him with goals of care decisions in completing an AMD and he is agreeable to that. Discussed code status including full vs partial vs DNR in detail. Also discussed  resuscitation efforts including CPR, defibrillation, intubation and emergency drugs. All patient questions answered. At this time, the patient wishes to remain a full code.      Time Spent:       Total time spent face-to-face in education and discussion: 21 minutes.       Orlinda Blalock, NP   01/21/2019   2:42 PM

## 2019-01-21 NOTE — ED Notes (Signed)
MD at bedside.

## 2019-01-21 NOTE — ED Notes (Signed)
CT notified of IV access 

## 2019-01-21 NOTE — Consults (Signed)
Consults  by Willette Cluster, MD at 01/21/19 [place]                Author: Willette Cluster, MD  Service: Nephrology  Author Type: Physician       Filed: 01/21/19 1202  Date of Service: 01/21/19 1156  Status: Signed          Editor: Willette Cluster, MD (Physician)            Consult Orders        1. IP CONSULT TO NEPHROLOGY [[id number]] ordered by Lisette Grinder, MD at 01/21/19 [place] NOTE            Patient: John Friedman  MRN: [id number]   PCP: Antony Haste, MD         DOB:     February 10, 1966   Age:   53 y.o.   Sex:  male         Referring physician: Lisette Grinder, MD   Reason for consultation: 53 y.o. male with No admission diagnoses are documented for this encounter.  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 *             Subjective:     HPI:  John Friedman is a 53 y.o. Caucasian male who has been admitted to the hospital for  SOB.  He was recently at [facility] for AKI and was started on HD.  Neg serologic w/u.  Enlarged prostate w/ obstruction, now with foley in place.  He is at [facility] MWF.  Presented w/ SOB and cough for the past several days.  CXR showed pulm edema.   CTA neg for PE.  He is resting on NC O2.  No cp, n/v/d, fevers or chills reported.      Past Medical Hx:      Past Medical History:        Diagnosis  Date         ?  Cancer ([place])            left testicle- stage III, nonseminomatous germ cell tumor, s/p orchiectomy & BEP x 3  cycles         ?  Chronic fatigue  January 1987     ?  Essential tremor       ?  GERD (  gastroesophageal reflux disease)       ?  Hypothyroid       ?  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  Lumbar radiculopathy  08/10/2012          S/p steroid injection by Dr Laverta [place] 08/01/12          ?  Normal cardiac stress test  09/05/11     ?  Sleep disturbances            acting out in his sleep            Past Surgical Hx:         Past Surgical History:         Procedure  Laterality  Date          ?  HX CHOLECYSTECTOMY    12/05/2011          abnormal HIDA scan (EF < 2%)          ?  HX COLONOSCOPY    07/12/10          hyperplastic polyp, repeat 10 yrs          ?  HX COLONOSCOPY    02/23/15          normal          ?  HX CYST REMOVAL  Right  01/06/2016          3rd toe ganglion cyst removal - podiatry (Dr Raliegh Ip)          ?  HX GI    09/09/11          EGD- normal          ?  HX HEENT  Left  06/2012          tonsilar bx: benign tissue (Dr Buddy Duty)          ?  HX ORCHIECTOMY  Left  2002     ?  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  HX SHOULDER ARTHROSCOPY  Right  08/13/2018           Medications:     Prior to Admission medications             Medication  Sig  Start Date  End Date  Taking?  Authorizing Provider            levoFLOXacin (LEVAQUIN) 500 mg tablet  Take 1 Tab by mouth daily.  01/21/19    Yes  Lisette Grinder, MD     OTHER  VIP Nasal spray twice a day, mold exposure (vasoactive intestinal peptide).        Provider, Historical     armodafinil (NUVIGIL) 200 mg tab  Take 1 Tab by mouth daily as needed.        Provider, Historical     propranolol (INDERAL) 20 mg tablet  Take 20 mg by mouth daily.        Provider, Historical     LYRICA 300 mg capsule  TAKE ONE (1) CAPSULE BY MOUTH ONCE A DAY  06/26/13      Antony Haste, MD     clonazepam (KLONOPIN) 1 mg tablet  Take 1 mg by mouth two (2) times a day.        Provider, Historical     OTHER  Supplement drink        Provider, Historical             clomiPRAMINE (ANAFRANIL) 50 mg capsule  Take 3 Caps by mouth daily.  12/04/12      Antony Haste, MD             Allergies        Allergen  Reactions         ?  Latex  Other (comments)             redness           Social Hx:  reports that he has never smoked. He has never used smokeless tobacco. He reports current alcohol use. He reports that he does not use drugs.         Family History         Problem  Relation  Age of Onset          ?  No Known Problems  Mother       ?  Parkinsonism  Father       ?  Arthritis-osteo  Father                scoliosis          ?  No Known Problems  Brother       ?  No Known Problems  Brother       ?  No Known Problems  Sister       ?  Cancer  Maternal Grandfather                prostate          ?  Cancer  Paternal Grandfather                prostate           Review of Systems:   A twelve point review of system was performed today. Pertinent positives and negatives are  mentioned in the HPI. The reminder of the ROS is negative and noncontributory.         Objective:      Vitals:       Vitals:           01/21/19 0823  01/21/19 0945         BP:  127/77  141/88     Pulse:  80  78     Resp:  22  19     Temp:  98 ??F (36.7 ??C)           SpO2:  95%          I&O's:  No intake/output data recorded.   Visit Vitals      BP  141/88     Pulse  78     Temp  98 ??F (36.7 ??C)     Resp  19        SpO2  95%           Physical Exam:   General:Alert, No distress,    HEENT: Eyes are PERRL.  Conjunctiva without pallor ,erythema.  The sclerae without icterus. .    Neck:Supple,no mass palpable   Lungs : Clears to auscultation Bilaterally, Normal respiratory effort   CVS: RRR, S1 S2 normal, No rub,  no LE edema   Abdomen: Soft, Non tender, No hepatosplenomegaly, bowel sounds present   Extremities: No cyanosis, No clubbing   Skin: No rash or lesions.  Lymph nodes: No palpable nodes   MS: No joint swelling, erythema, warmth   Neurologic: non focal, AAO x 3   GU: foley in place   Access: R IJ  PC in place   Laboratory Results:        Lab Results         Component  Value  Date            BUN  48 (H)  01/21/2019       NA  131 (L)  01/21/2019       K  3.9  01/21/2019       CL  93 (L)  01/21/2019            CO2  25  01/21/2019             Lab Results         Component  Value  Date            BUN  48 (H)  01/21/2019       BUN  13  04/09/2018       BUN  15  08/01/2016       BUN  15  09/01/2015       BUN  12  08/31/2015       K  3.9  01/21/2019       K  4.6  04/09/2018       K  4.3  08/01/2016       K  4.0  09/01/2015            K  4.6  08/31/2015             Lab Results         Component  Value  Date            WBC  4.8  01/21/2019       RBC  2.52 (L)  01/21/2019       HGB  7.6 (L)  01/21/2019       HCT  23.6 (L)  01/21/2019       MCV  93.7  01/21/2019       MCH  30.2  01/21/2019       RDW  12.4  01/21/2019            PLT  129 (L)  01/21/2019           No results found for: PTH, PHOS      Urine dipstick:      Lab Results         Component  Value  Date/Time            Color  YELLOW/STRAW  08/16/2014 08:53 PM       Appearance  CLEAR  08/16/2014 08:53 PM       Specific gravity  1.005  08/16/2014 08:53 PM       pH (UA)  7.0  08/16/2014 08:53 PM       Protein  NEGATIVE   08/16/2014 08:53 PM       Glucose  NEGATIVE   08/16/2014 08:53 PM       Ketone  15 (A)  08/16/2014 08:53 PM       Bilirubin  NEGATIVE   08/16/2014 08:53 PM       Urobilinogen  0.2  08/16/2014 08:53 PM       Nitrites  NEGATIVE   08/16/2014 08:53 PM       Leukocyte Esterase  NEGATIVE   08/16/2014 08:53 PM       Epithelial cells  FEW  08/16/2014 08:53 PM       Bacteria  NEGATIVE   08/16/2014 08:53 PM       WBC  0-4  08/16/2014 08:53 PM            RBC  0-5  08/16/2014 08:53 PM           I have reviewed the following:     All pertinent labs, microbiology data, radiology imaging for my assessment                   Thank you for allowing [place] to participate in the care of this patient.    We will follow patient. Please dont hesitate to call with any  questions      Willette Cluster, MD   01/21/2019            [facility]     [address] [place] [address]   Phone - [phone]    Fax - [phone]   [email or web address]

## 2019-01-21 NOTE — ED Notes (Signed)
Pt calling out of room. This RN went in to assist patient with emptying catheter bag. Pt sats dropped to 87% on room air with activity.    Pt states he is willing to be admitted now    Dr. Trey Paula advised

## 2019-01-21 NOTE — Progress Notes (Signed)
Care Management - Patient is COVID-19 rule out. Attempted to call patient while in ED. He had just left to go to the unit. Called patient in his room just now. Patient stated he was having more difficulty breathing than when he was in the emergency room. Patient's nurse arrived to patient's room to assess patient.     Collier Flowers, MSW

## 2019-01-21 NOTE — Progress Notes (Signed)
Admission Medication Reconciliation:    Information obtained from:  Patient via telephone interview,  RX query??    Comments/Recommendations: Reviewed PTA medications and patient's allergies.  Patient is an excellent historian. Expresses concern with propranolol being given too close to HD due to risk of lowering blood pressure.     Medication changes (since last review):  Added  - Cytomel - patient has not yet started taking this new medication because he wants to wait until he feels better  -Flomax    Adjusted  - clomipramine 150mg  to 50mg     Removed  - Nasal spray       ??RxQuery pharmacy benefit data reflects medications filled and processed through the patient's insurance, however   this data does NOT capture whether the medication was picked up or is currently being taken by the patient.    Allergies:  Latex    Significant PMH/Disease States:   Past Medical History:   Diagnosis Date    Anemia 01/21/2019    ARF (acute renal failure) (HCC)     on HD    BPH (benign prostatic hyperplasia)     Cancer (HCC)     left testicle- stage III, nonseminomatous germ cell tumor, s/p orchiectomy & BEP x 3 cycles    Chronic fatigue January 1987    Essential tremor     GERD (gastroesophageal reflux disease)     H/O prolonged Q-T interval on ECG 01/21/2019    Chronic    Hyponatremia 01/21/2019    Hypothyroid     Irritable bowel syndrome with diarrhea 05/12/2015    Lumbar radiculopathy 08/10/2012    S/p steroid injection by Dr Laverta [place] 08/01/12     Normal cardiac stress test 09/05/11    Pleural effusion, bilateral 01/21/2019    Pulmonary edema 01/21/2019    Sleep disturbances     acting out in his sleep    Thrombocytopenia ([place]) 01/21/2019     Chief Complaint for this Admission:    Chief Complaint   Patient presents with    Shortness of Breath     Prior to Admission Medications:   Prior to Admission Medications   Prescriptions Last Dose Informant Patient Reported? Taking?   OTHER 01/21/2019  Yes Yes    Sig: Supplemental drink - phosphatidylcholine with omega 3 and 6 and dextrose - to help with tremors.   armodafinil (NUVIGIL) 200 mg tab   Yes Yes   Sig: Take 1 Tab by mouth daily as needed.   clomiPRAMINE (ANAFRANIL) 50 mg capsule 01/20/2019 at Unknown time  Yes Yes   Sig: Take 50 mg by mouth nightly.   clonazepam (KLONOPIN) 1 mg tablet 01/21/2019  Yes Yes   Sig: Take 1 mg by mouth two (2) times a day.   liothyronine (CYTOMEL) 5 mcg tablet Not Taking at Unknown time  Yes No   Sig: Take 5 mcg by mouth daily.   pregabalin (Lyrica) 300 mg capsule 01/20/2019 at Unknown time  Yes Yes   Sig: Take 300 mg by mouth nightly. For "sleep disturbances" - pt has had injuries from falling out of bed during sleep   propranolol (INDERAL) 20 mg tablet 01/21/2019  Yes Yes   Sig: Take 20 mg by mouth daily. For tremors   tamsulosin (Flomax) 0.4 mg capsule 01/21/2019  Yes Yes   Sig: Take 0.4 mg by mouth daily.      Facility-Administered Medications: None

## 2019-01-21 NOTE — ED Provider Notes (Signed)
HPI     [name] a 53 y.o. M with PMH of GERD, tremor, CA, IBS, and AKI here with c/o shortness of breath for 4 days.  He says he was sent to ED today to obtain a CXR and r/o PNA and PE.  Pt was recently admitted to [facility] 2/2 to AKI that was due to enlarged prostate causing post renal failure.  He now has a foley in place until he can see urology and has dialysis M, W, F.   He is scheduled for dialysis today around 3:30pm.  In addition to dyspnea he c/o orthopnea.  No other complaints at this time.  He denies fever.     Nephrologist: Dr. Leontine Locket      Past Medical History:   Diagnosis Date   ??? Cancer ([place])     left testicle- stage III, nonseminomatous germ cell tumor, s/p orchiectomy & BEP x 3 cycles   ??? Chronic fatigue January 1987   ??? Essential tremor    ??? GERD (gastroesophageal reflux disease)    ??? Hypothyroid    ??? 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 Lumbar radiculopathy 08/10/2012    S/p steroid injection by Dr Laverta [place] 08/01/12    ??? Normal cardiac stress test 09/05/11   ??? Sleep disturbances     acting out in his sleep       Past Surgical History:   Procedure Laterality Date   ??? HX CHOLECYSTECTOMY  12/05/2011    abnormal HIDA scan (EF < 2%)   ??? HX COLONOSCOPY  07/12/10    hyperplastic polyp, repeat 10 yrs   ??? HX COLONOSCOPY  02/23/15    normal   ??? HX CYST REMOVAL Right 01/06/2016    3rd toe ganglion cyst removal - podiatry (Dr Raliegh Ip)   ??? HX GI  09/09/11    EGD- normal   ??? HX HEENT Left 06/2012    tonsilar bx: benign tissue (Dr Buddy Duty)   ??? HX ORCHIECTOMY Left 2002   ??? HX ORTHOPAEDIC      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67   ??? HX SHOULDER ARTHROSCOPY Right 08/13/2018         Family History:   Problem Relation Age of Onset   ??? No Known Problems Mother    ??? Parkinsonism Father    ??? Arthritis-osteo Father         scoliosis   ??? No Known Problems Brother    ??? No Known Problems Brother    ??? No Known Problems Sister    ??? Cancer Maternal Grandfather         prostate   ??? Cancer Paternal Grandfather         prostate        Social History     Socioeconomic History   ??? Marital status: SINGLE     Spouse name: Not on file   ??? Number of children: Not on file   ??? Years of education: Not on file   ??? Highest education level: Not on file   Occupational History   ??? Not on file   Social Needs   ??? Financial resource strain: Not on file   ??? Food insecurity     Worry: Not on file     Inability: Not on file   ??? Transportation needs     Medical: Not on file     Non-medical: Not on file   Tobacco Use   ??? Smoking status: Never Smoker   ???  Smokeless tobacco: Never Used   Substance and Sexual Activity   ??? Alcohol use: Yes     Comment: rarely   ??? Drug use: No   ??? Sexual activity: Never     Partners: Female   Lifestyle   ??? Physical activity     Days per week: Not on file     Minutes per session: Not on file   ??? Stress: Not on file   Relationships   ??? Social Product manager on phone: Not on file     Gets together: Not on file     Attends religious service: Not on file     Active member of club or organization: Not on file     Attends meetings of clubs or organizations: Not on file     Relationship status: Not on file   ??? Intimate partner violence     Fear of current or ex partner: Not on file     Emotionally abused: Not on file     Physically abused: Not on file     Forced sexual activity: Not on file   Other Topics Concern   ??? Not on file   Social History Narrative   ??? Not on file         ALLERGIES: Latex    Review of Systems   Constitutional: Negative for chills, diaphoresis and fever.   HENT: Negative for congestion and trouble swallowing.    Eyes: Negative for photophobia and visual disturbance.   Respiratory: Positive for shortness of breath. Negative for cough and chest tightness.    Cardiovascular: Negative for chest pain, palpitations and leg swelling.   Gastrointestinal: Negative for abdominal pain, diarrhea, nausea and vomiting.   Genitourinary: Negative for difficulty urinating, dysuria, flank pain and frequency.    Musculoskeletal: Negative for back pain and myalgias.   Skin: Negative for rash and wound.   Neurological: Negative for dizziness, weakness, light-headedness and headaches.   Hematological: Negative for adenopathy. Does not bruise/bleed easily.   Psychiatric/Behavioral: Negative for agitation and confusion.   All other systems reviewed and are negative.      Vitals:    01/21/19 0823   BP: 127/77   Pulse: 80   Resp: 22   Temp: 98 ??F (36.7 ??C)   SpO2: 95%            Physical Exam  Vitals signs and nursing note reviewed.   Constitutional:       General: He is not in acute distress.     Appearance: He is well-developed. He is not diaphoretic.   HENT:      Head: Normocephalic.   Eyes:      Conjunctiva/sclera: Conjunctivae normal.      Pupils: Pupils are equal, round, and reactive to light.   Neck:      Musculoskeletal: Normal range of motion and neck supple.      Vascular: No JVD.   Cardiovascular:      Rate and Rhythm: Normal rate and regular rhythm.      Heart sounds: Normal heart sounds.   Pulmonary:      Effort: Pulmonary effort is normal.      Breath sounds: Normal breath sounds.   Chest:       Abdominal:      General: Bowel sounds are normal. There is no distension.      Palpations: Abdomen is soft.      Tenderness: There is no abdominal tenderness.  Musculoskeletal: Normal range of motion.         General: No tenderness or deformity.   Lymphadenopathy:      Cervical: No cervical adenopathy.   Skin:     General: Skin is warm and dry.      Capillary Refill: Capillary refill takes less than 2 seconds.      Findings: No erythema or rash.   Neurological:      Mental Status: He is alert and oriented to person, place, and time.      Cranial Nerves: No cranial nerve deficit.      Sensory: No sensory deficit.          MDM       Procedures    ED EKG interpretation:  Rhythm: normal sinus rhythm; and regular . Rate (approx.): 79; Axis: normal; P wave: normal; QRS interval: normal ; ST/T wave: normal; EKG  documented by Lisette Grinder, MD,  as interpreted by Lisette Grinder, MD, ED MD.    9:08 AM  Pt says he is here for r/o PNA and PE.  However, he is refusing IV at this time.  I have advised pt I cannot rule out PE with out imaging and he will need IV for this.  He says he will consider it.  He is also stating he does not want to be in ED long and does not think he wants to be admitted.  He wants to make his dialysis at 3:30 pm.  However, he will allow a work up for now.    CONSULT NOTE:  10:50 AM Lisette Grinder, MD spoke with Dr. Roanna Banning, Consult for Nephrology.  Discussed available diagnostic tests and clinical findings.  Dr. Roanna Banning recommends arranging dialysis.  He agrees with admission.     10:50 AM  Pt does not want to be admitted.  He will accept antibiotics in ED.  I have advised him, he may have a hospital associated pneumonia since he was recently hospitalized and should obtain IV antibiotics until dialysis and repeat imaging can be obtained to see if true PNA present.  However, pt still does not want to stay.      12:18 PM  Pt hypoxic with ambulation and agrees to stay now.    Hospitalist Perfect Serve for Admission  12:18 PM    ED Room Number: ER13/13  Patient Name and age:  John Friedman 53 y.o.  male  Working Diagnosis:   1. SOB (shortness of breath)    2. Pleural effusion    3. Hypervolemia, unspecified hypervolemia type        COVID-19 Suspicion:  no    Code Status:  Full Code  Readmission: no  Isolation Requirements:  no  Recommended Level of Care:  telemetry  Department:[facility] Adult ED - [phone]  Other:  Pt with effusions and interstitial edema with recent dx of ESRD on dialysis after AKI from prostate causing obstruction.  He has foley in place with good output.  He had dialysis this afternoon but came to day bc increase shortness of breath since Friday and orthopnea.  CTA no PE but cannot r/o PNA as well.  Antibiotics given.  Dr. Roanna Banning with nephrology to  arrange dialysis.  Pt initially wanted to leave AMA and refused IV but now has decided to stay.  He is hypoxic with ambulation also.     Lisette Grinder, MD

## 2019-01-21 NOTE — Telephone Encounter (Signed)
Received call from scheduler at [place] office.     patient states he was in the hospital for 8 days for acute kidney failure, just got out and is having short of breath.  He feels he can't lay down, has to sit upright to breath.  He is speaking in strong complete sentences.    He denies fever.  Denies know covid-19 exposure, but states, "I was just in the hospital for 8 days".  He states he feels his breathing is labored.   He is scheduled for dialysis tonight.   With the long hospitalization,  Multiple procedures and bedrest while in hospital, it's recommend he proceed to ER now for evaluation.    Patient agrees and states he will proceed to [facility] ER.  Please do not respond to the triage nurse through this encounter.  Any subsequent communication should be directly with the patient.          Reason for Disposition  ??? Recent illness requiring prolonged bedrest (i.e., immobilization)    Protocols used: BREATHING DIFFICULTY-ADULT-OH

## 2019-01-21 NOTE — ED Triage Notes (Signed)
Referred here by PCP for chest xray to r/o pna.  Pt recently admitted to [facility] for AKI and is due for dialysis today at 330.  +orthopnea

## 2019-01-21 NOTE — Telephone Encounter (Signed)
Admitted to to [facility], pulmonary edema.

## 2019-01-21 NOTE — Consults (Signed)
NEPHROLOGY CONSULT NOTE     Patient: John Friedman MRN: [id number]  PCP: Antony Haste, MD   DOB:     06-01-1966  Age:   53 y.o.  Sex:  male      Referring physician: Lisette Grinder, MD  Reason for consultation: 53 y.o. male with No admission diagnoses are documented for this encounter. complicated by AKI   Admission Date: 01/21/2019  8:27 AM  LOS: 0 days      ASSESSMENT and PLAN :   AKI:  - unclear cause, progressive CKD vs obstructive uropathy  - HD MWF at [facility]  - dialysis today    Volume overload:  - HD today    PNA:  - started levaquin    Anemia of CKD:  - Iron studies in AM  - ESA w/ HD while here    Obstructive uropathy:  - foley in place  - f/u w/ urology as an outpt       Active Problems / Assessment AAActive  :   Active Problems:    * No active hospital problems. *       Subjective:   HPI: John Friedman is a 53 y.o. Caucasian male who has been admitted to the hospital for SOB.  He was recently at [facility] for AKI and was started on HD.  Neg serologic w/u.  Enlarged prostate w/ obstruction, now with foley in place.  He is at [facility] MWF.  Presented w/ SOB and cough for the past several days.  CXR showed pulm edema.  CTA neg for PE.  He is resting on NC O2.  No cp, n/v/d, fevers or chills reported.    Past Medical Hx:   Past Medical History:   Diagnosis Date   ??? Cancer ([place])     left testicle- stage III, nonseminomatous germ cell tumor, s/p orchiectomy & BEP x 3 cycles   ??? Chronic fatigue January 1987   ??? Essential tremor    ??? GERD (gastroesophageal reflux disease)    ??? Hypothyroid    ??? 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 Lumbar radiculopathy 08/10/2012    S/p steroid injection by Dr Laverta  08/01/12    ??? Normal cardiac stress test 09/05/11   ??? Sleep disturbances     acting out in his sleep        Past Surgical Hx:     Past Surgical History:   Procedure Laterality Date   ??? HX CHOLECYSTECTOMY  12/05/2011    abnormal HIDA scan (EF < 2%)   ??? HX COLONOSCOPY  07/12/10     hyperplastic polyp, repeat 10 yrs   ??? HX COLONOSCOPY  02/23/15    normal   ??? HX CYST REMOVAL Right 01/06/2016    3rd toe ganglion cyst removal - podiatry (Dr Raliegh Ip)   ??? HX GI  09/09/11    EGD- normal   ??? HX HEENT Left 06/2012    tonsilar bx: benign tissue (Dr Buddy Duty)   ??? HX ORCHIECTOMY Left 2002   ??? 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 HX SHOULDER ARTHROSCOPY Right 08/13/2018       Medications:  Prior to Admission medications    Medication Sig Start Date End Date Taking? Authorizing Provider   levoFLOXacin (LEVAQUIN) 500 mg tablet Take 1 Tab by mouth daily. 01/21/19  Yes Lisette Grinder, MD   OTHER VIP Nasal spray twice a day,  mold exposure (vasoactive intestinal peptide).    Provider, Historical   armodafinil (NUVIGIL) 200 mg tab Take 1 Tab by mouth daily as needed.    Provider, Historical   propranolol (INDERAL) 20 mg tablet Take 20 mg by mouth daily.    Provider, Historical   LYRICA 300 mg capsule TAKE ONE (1) CAPSULE BY MOUTH ONCE A DAY 06/26/13   Antony Haste, MD   clonazepam (KLONOPIN) 1 mg tablet Take 1 mg by mouth two (2) times a day.    Provider, Historical   OTHER Supplement drink    Provider, Historical   clomiPRAMINE (ANAFRANIL) 50 mg capsule Take 3 Caps by mouth daily. 12/04/12   Antony Haste, MD       Allergies   Allergen Reactions   ??? Latex Other (comments)     redness       Social Hx:  reports that he has never smoked. He has never used smokeless tobacco. He reports current alcohol use. He reports that he does not use drugs.     Family History   Problem Relation Age of Onset   ??? No Known Problems Mother    ??? Parkinsonism Father    ??? Arthritis-osteo Father         scoliosis   ??? No Known Problems Brother    ??? No Known Problems Brother    ??? No Known Problems Sister    ??? Cancer Maternal Grandfather         prostate   ??? Cancer Paternal Grandfather         prostate       Review of Systems:  A twelve point review of system was performed today. Pertinent positives  and negatives are mentioned in the HPI. The reminder of the ROS is negative and noncontributory.     Objective:    Vitals:    Vitals:    01/21/19 0823 01/21/19 0945   BP: 127/77 141/88   Pulse: 80 78   Resp: 22 19   Temp: 98 ??F (36.7 ??C)    SpO2: 95%      I&O's:  No intake/output data recorded.  Visit Vitals  BP 141/88   Pulse 78   Temp 98 ??F (36.7 ??C)   Resp 19   SpO2 95%       Physical Exam:  General:Alert, No distress,   HEENT: Eyes are PERRL.  Conjunctiva without pallor ,erythema.  The sclerae without icterus. .   Neck:Supple,no mass palpable  Lungs : Clears to auscultation Bilaterally, Normal respiratory effort  CVS: RRR, S1 S2 normal, No rub,  no LE edema  Abdomen: Soft, Non tender, No hepatosplenomegaly, bowel sounds present  Extremities: No cyanosis, No clubbing  Skin: No rash or lesions.  Lymph nodes: No palpable nodes  MS: No joint swelling, erythema, warmth  Neurologic: non focal, AAO x 3  GU: foley in place  Access: R IJ PC in place  Laboratory Results:    Lab Results   Component Value Date    BUN 48 (H) 01/21/2019    NA 131 (L) 01/21/2019    K 3.9 01/21/2019    CL 93 (L) 01/21/2019    CO2 25 01/21/2019       Lab Results   Component Value Date    BUN 48 (H) 01/21/2019    BUN 13 04/09/2018    BUN 15 08/01/2016    BUN 15 09/01/2015    BUN 12 08/31/2015    K 3.9 01/21/2019  K 4.6 04/09/2018    K 4.3 08/01/2016    K 4.0 09/01/2015    K 4.6 08/31/2015       Lab Results   Component Value Date    WBC 4.8 01/21/2019    RBC 2.52 (L) 01/21/2019    HGB 7.6 (L) 01/21/2019    HCT 23.6 (L) 01/21/2019    MCV 93.7 01/21/2019    MCH 30.2 01/21/2019    RDW 12.4 01/21/2019    PLT 129 (L) 01/21/2019       No results found for: PTH, PHOS    Urine dipstick:   Lab Results   Component Value Date/Time    Color YELLOW/STRAW 08/16/2014 08:53 PM    Appearance CLEAR 08/16/2014 08:53 PM    Specific gravity 1.005 08/16/2014 08:53 PM    pH (UA) 7.0 08/16/2014 08:53 PM    Protein NEGATIVE  08/16/2014 08:53 PM     Glucose NEGATIVE  08/16/2014 08:53 PM    Ketone 15 (A) 08/16/2014 08:53 PM    Bilirubin NEGATIVE  08/16/2014 08:53 PM    Urobilinogen 0.2 08/16/2014 08:53 PM    Nitrites NEGATIVE  08/16/2014 08:53 PM    Leukocyte Esterase NEGATIVE  08/16/2014 08:53 PM    Epithelial cells FEW 08/16/2014 08:53 PM    Bacteria NEGATIVE  08/16/2014 08:53 PM    WBC 0-4 08/16/2014 08:53 PM    RBC 0-5 08/16/2014 08:53 PM       I have reviewed the following:   All pertinent labs, microbiology data, radiology imaging for my assessment           Thank you for allowing [place] to participate in the care of this patient.   We will follow patient. Please don???t hesitate to call with any questions    Willette Cluster, MD  01/21/2019        [facility]   [address], [place] [address]  Phone - [phone]   Fax - [phone]  [email or web address]

## 2019-01-21 NOTE — H&P (Signed)
[facility]  History and Physical    Primary Care Provider: Antony Haste, MD  Date of Service:  01/21/2019    Subjective:     John Friedman is a 53 y.o. male with past medical history of left testicular cancer status post orchiectomy and BEP x3 cycles, chronic fatigue syndrome, essential tremor, GERD, subclinical hypothyroidism, irritable bowel syndrome with diarrhea, lumbar radiculopathy, BPH who presented to ED today with complaint of shortness of breath.  Of note, patient reports that he was hospitalized at [facility] for approximately 8 days and got out some point last week.  He was hospitalized due to his prostate enlargement causing occlusion and subsequent acute renal failure requiring him to now be on hemodialysis.  He has a chronic Foley catheter with leg bag since that time.    Work-up in ED was remarkable for anemia with hemoglobin of 7.6, thrombocytopenia with platelets of 129, lymphocytopenia with lymphocytes of 10, hyponatremia with sodium of 131, acute renal failure with creatinine of 9.46, lactic acid was normal, CT scan showed no evidence of PE but moderately large bilateral pleural effusions.  Also moderately severe pulmonary edema pattern and bilateral nodular airspace disease and bilateral consolidation that may indicate superimposed pneumonia.  Patient reports that he does feel as though he has pneumonia and he has had a cough but is not "bringing anything up."  He denies fever, myalgias, dizziness or chest pain, abdominal pain, nausea vomiting or diarrhea, paresthesias, loss of taste or sense of smell.  He was due to have dialysis today around 3 PM.    Patient was initially given 1 dose of 750 mg of Levaquin IV in the ED and patient was going to leave [name].  He tells me that he is just frustrated because he spent 8 days in the hospital and he feels that is the reason that he feels somewhat weak.  He does not use a walker or  anything else to walk.  He is frustrated because he is on hemodialysis and does not know if his kidneys will recover.  Of note, the patient is an Marine scientist by trade Counselling psychologist) and relies heavily on details and facts.  He was also adopted and his adopted father was an OB/GYN and he tells me that he was used to "the personal touch" and having things explained to him.  I spent a great deal of time discussing his significant comorbidities and and new need for HD but also his frustration and desire to "just be home and eat what I want."  See ACP.  Discussed consulting palliative care and their role in decision making and goals of care.  He reports that his mother is 93 years old and she is only family he children.  He reports that he can tell us his philosophy on what he would like to have done as far as CODE STATUS, he states he wants to "fight like hell."  He is agreeable to palliative care consult.    Discussed inpatient admission with the patient who wanted to know if he could leave after dialysis.  Explained to him in great detail that he has quite a bit of fluid around his lungs as well as pneumonia in both his lungs and pleural effusions which are pressing on his lungs and causing him to be hypoxic.  Explained need for adequate oxygenation to all organs especially kidneys, given that he is receiving dialysis and wishes for his kidneys  to return to normal function.  Explained that if he did decide to leave [name], he would be at great risk for sudden death related to his fluid overload status as well as his pneumonia causing hypoxia and the stress of this on his heart as well.  He reported understanding and states that this point he will stay.  Discussed with Dr. Roanna Banning who will see the patient for nephrology. Discussed also with Dr. Army Melia.    Review of Systems:    A comprehensive review of systems was negative except for that written in the History of Present Illness.      Past Medical History:   Diagnosis Date   ??? Anemia 01/21/2019   ??? ARF (acute renal failure) (HCC)     on HD   ??? BPH (benign prostatic hyperplasia)    ??? Cancer ([place])     left testicle- stage III, nonseminomatous germ cell tumor, s/p orchiectomy & BEP x 3 cycles   ??? Chronic fatigue January 1987   ??? Essential tremor    ??? GERD (gastroesophageal reflux disease)    ??? 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 Hyponatremia 01/21/2019   ??? Hypothyroid    ??? 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 Lumbar radiculopathy 08/10/2012    S/p steroid injection by Dr Laverta Millard 08/01/12    ??? Normal cardiac stress test 09/05/11   ??? Pleural effusion, bilateral 01/21/2019   ??? Pulmonary edema 01/21/2019   ??? Sleep disturbances     acting out in his sleep   ??? Thrombocytopenia ([place]) 01/21/2019      Past Surgical History:   Procedure Laterality Date   ??? HX CHOLECYSTECTOMY  12/05/2011    abnormal HIDA scan (EF < 2%)   ??? HX COLONOSCOPY  07/12/10    hyperplastic polyp, repeat 10 yrs   ??? HX COLONOSCOPY  02/23/15    normal   ??? HX CYST REMOVAL Right 01/06/2016    3rd toe ganglion cyst removal - podiatry (Dr Raliegh Ip)   ??? HX GI  09/09/11    EGD- normal   ??? HX HEENT Left 06/2012    tonsilar bx: benign tissue (Dr Buddy Duty)   ??? HX ORCHIECTOMY Left 2002   ??? HX ORTHOPAEDIC      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8   ??? HX SHOULDER ARTHROSCOPY Right 08/13/2018     Prior to Admission medications    Medication Sig Start Date End Date Taking? Authorizing Provider   OTHER VIP Nasal spray twice a day, mold exposure (vasoactive intestinal peptide).    Provider, Historical   armodafinil (NUVIGIL) 200 mg tab Take 1 Tab by mouth daily as needed.    Provider, Historical   propranolol (INDERAL) 20 mg tablet Take 20 mg by mouth daily.    Provider, Historical   LYRICA 300 mg capsule TAKE ONE (1) CAPSULE BY MOUTH ONCE A DAY 06/26/13   Antony Haste, MD   clonazepam (KLONOPIN) 1 mg tablet Take 1 mg by mouth two (2) times a day.    Provider, Historical    OTHER Supplement drink    Provider, Historical   clomiPRAMINE (ANAFRANIL) 50 mg capsule Take 3 Caps by mouth daily. 12/04/12   Antony Haste, MD     Allergies   Allergen Reactions   ??? Latex Other (comments)     redness      Family History   Problem Relation Age of Onset   ???  No Known Problems Mother    ??? Parkinsonism Father    ??? Arthritis-osteo Father         scoliosis   ??? No Known Problems Brother    ??? No Known Problems Brother    ??? No Known Problems Sister    ??? Cancer Maternal Grandfather         prostate   ??? Cancer Paternal Grandfather         prostate        SOCIAL HISTORY:  Patient resides at home alone  Patient ambulates with walking  Smoking history: Never  Alcohol history: Denies      Objective:       Physical Exam:   Visit Vitals  BP 153/88   Pulse 75   Temp 97.8 ??F (36.6 ??C)   Resp 18   SpO2 94%     General:  Alert, cooperative, no distress, appears stated age   Head:  Normocephalic, without obvious abnormality, atraumatic, wears glasses   Eyes:  Conjunctivae/corneas clear. Pupils equal, round, reactive to light. Extraocular movements intact.   Lungs:   Diminished throughout, L>R, some scattered rhonchi; no wheezing   Heart:  Regular rate and rhythm, S1, S2 normal, no murmur, click, rub, or gallop.   Abdomen:   Soft, non-tender. Bowel sounds normal. No masses. No organomegaly; chronic foley w/ leg bag   Extremities: Extremities normal, atraumatic, 1+ pitting edema to bilateral ankles   Pulses: 2+ and symmetric all extremities.   Skin: Skin color, texture, turgor normal. No rashes or lesions.   Neurologic: CNII-XII intact. Normal strength throughout. A&Ox4.       ECG: Normal sinus rhythm   Prolonged QT   When compared with ECG of 01-Sep-2015 15:20,   QT has shortened     Data Review:   Recent Results (from the past 24 hour(s))   EKG, 12 LEAD, INITIAL    Collection Time: 01/21/19  8:31 AM   Result Value Ref Range    Ventricular Rate 79 BPM    Atrial Rate 79 BPM    P-R Interval 176 ms     QRS Duration 86 ms    Q-T Interval 436 ms    QTC Calculation (Bezet) 499 ms    Calculated P Axis 47 degrees    Calculated R Axis 33 degrees    Calculated T Axis 55 degrees    Diagnosis       Normal sinus rhythm  Prolonged QT  When compared with ECG of 01-Sep-2015 15:20,  QT has shortened     CBC WITH AUTOMATED DIFF    Collection Time: 01/21/19  8:34 AM   Result Value Ref Range    WBC 4.8 4.1 - 11.1 K/uL    RBC 2.52 (L) 4.10 - 5.70 M/uL    HGB 7.6 (L) 12.1 - 17.0 g/dL    HCT 23.6 (L) 36.6 - 50.3 %    MCV 93.7 80.0 - 99.0 FL    MCH 30.2 26.0 - 34.0 PG    MCHC 32.2 30.0 - 36.5 g/dL    RDW 12.4 11.5 - 14.5 %    PLATELET 129 (L) 150 - 400 K/uL    MPV 11.1 8.9 - 12.9 FL    NRBC 0.0 0 PER 100 WBC    ABSOLUTE NRBC 0.00 0.00 - 0.01 K/uL    NEUTROPHILS 74 32 - 75 %    LYMPHOCYTES 10 (L) 12 - 49 %    MONOCYTES 11 5 - 13 %  EOSINOPHILS 4 0 - 7 %    BASOPHILS 1 0 - 1 %    IMMATURE GRANULOCYTES 0 0.0 - 0.5 %    ABS. NEUTROPHILS 3.6 1.8 - 8.0 K/UL    ABS. LYMPHOCYTES 0.5 (L) 0.8 - 3.5 K/UL    ABS. MONOCYTES 0.5 0.0 - 1.0 K/UL    ABS. EOSINOPHILS 0.2 0.0 - 0.4 K/UL    ABS. BASOPHILS 0.0 0.0 - 0.1 K/UL    ABS. IMM. GRANS. 0.0 0.00 - 0.04 K/UL    DF SMEAR SCANNED      RBC COMMENTS NORMOCYTIC, NORMOCHROMIC     METABOLIC PANEL, COMPREHENSIVE    Collection Time: 01/21/19  8:34 AM   Result Value Ref Range    Sodium 131 (L) 136 - 145 mmol/L    Potassium 3.9 3.5 - 5.1 mmol/L    Chloride 93 (L) 97 - 108 mmol/L    CO2 25 21 - 32 mmol/L    Anion gap 13 5 - 15 mmol/L    Glucose 84 65 - 100 mg/dL    BUN 48 (H) 6 - 20 MG/DL    Creatinine 9.46 (H) 0.70 - 1.30 MG/DL    BUN/Creatinine ratio 5 (L) 12 - 20      GFR est AA 7 (L) >60 ml/min/1.52m    GFR est non-AA 6 (L) >60 ml/min/1.731m   Calcium 7.5 (L) 8.5 - 10.1 MG/DL    Bilirubin, total 0.4 0.2 - 1.0 MG/DL    ALT (SGPT) 25 12 - 78 U/L    AST (SGOT) 20 15 - 37 U/L    Alk. phosphatase 86 45 - 117 U/L    Protein, total 6.1 (L) 6.4 - 8.2 g/dL    Albumin 3.3 (L) 3.5 - 5.0 g/dL     Globulin 2.8 2.0 - 4.0 g/dL    A-G Ratio 1.2 1.1 - 2.2     SAMPLES BEING HELD    Collection Time: 01/21/19  8:34 AM   Result Value Ref Range    SAMPLES BEING HELD  1 RED, 1 BLUE     COMMENT        Add-on orders for these samples will be processed based on acceptable specimen integrity and analyte stability, which may vary by analyte.   LACTIC ACID    Collection Time: 01/21/19 10:53 AM   Result Value Ref Range    Lactic acid 0.6 0.4 - 2.0 MMOL/L         Chest x-ray:   CXR Results  (Last 48 hours)               01/21/19 0908  XR CHEST PA LAT Final result    Impression:  IMPRESSION: Extensive pulmonary vascular congestive changes, as described above.           Narrative:  Indication:  SOB. Orthopnea.       Exam: PA and lateral views of the chest.       Direct comparison is made to prior CXR dated January 2017.       Findings: Cardiomediastinal silhouette is partially obscured by patchy bibasilar   airspace disease and small bilateral pleural effusions. There is also pulmonary   vascular prominence and bilateral interstitial edema. Central venous catheter   extends to the right atrium. There is no pneumothorax.               CT Results  (Last 48 hours)  01/21/19 1004  CTA [place] CONT Final result    Impression:  IMPRESSION:   No evidence of acute pulmonary embolus. Moderately large bilateral pleural   effusions. Moderately severe pulmonary edema pattern. Bilateral nodular airspace   disease and bilateral consolidation may indicate superimposed pneumonia;   radiographic follow-up is recommended to assure resolution.       Narrative:  INDICATION: Shortness of breath and orthopnea. Recent hospitalization. End-stage   renal disease.       COMPARISON:PA and lateral chest radiograph from earlier today       TECHNIQUE:     Routine noncontrast imaging the chest was performed for localization purposes.   Then, following the uneventful intravenous administration of 75 cc [id number],    thin helical axial images were obtained through the chest. 3D image   postprocessing was performed. CT dose reduction was achieved through use of a   standardized protocol tailored for this examination and automatic exposure   control for dose modulation. Adaptive statistical iterative reconstruction   (ASIR) was utilized.       FINDINGS:       CHEST WALL: No chest wall mass or axillary lymphadenopathy.   THYROID: No nodule.   MEDIASTINUM: No mass or lymphadenopathy. Right internal jugular tunneled   dialysis catheter terminates at the cavoatrial junction.   HILA: No mass or lymphadenopathy.   THORACIC AORTA: No dissection or aneurysm.   PULMONARY ARTERIES: Main pulmonary artery is normal in caliber. No evidence of   acute pulmonary emboli.   TRACHEA/BRONCHI: Patent.   ESOPHAGUS: No wall thickening or dilatation.   HEART: Normal in size.   PLEURA: Moderately large bilateral pleural effusions.   LUNGS: Moderately severe pulmonary interstitial edema pattern. Consolidation in   the right middle lobe and bilateral lower lobes. Nodular airspace disease in the   upper lobes bilaterally.   INCIDENTALLY IMAGED UPPER ABDOMEN: No focal abnormality.   BONES: No destructive bone lesion.   ADDITIONAL COMMENTS: N/A                   Assessment:     Principal Problem:    Hospital acquired PNA (01/21/2019)    Active Problems:    Subclinical hypothyroidism ()      Chronic fatigue (08/15/1985)      GERD (gastroesophageal reflux disease) ()      Overview: Normal EGD (09/09/11)      H/O testicular cancer ()      Overview: testicular cancer (Stage III)      Acute renal failure ([place]) (01/20/2019)      Overview: May '20 - due to urinary retention, seen at [facility].  HD on M/W/F.      Hyponatremia (01/21/2019)      Thrombocytopenia ([place]) (01/21/2019)      Anemia (01/21/2019)      Pleural effusion, bilateral (01/21/2019)      Pulmonary edema (01/21/2019)      History of diastolic dysfunction (7/0/6237)      Overview: Chronic        Plan:     SOB   -multifactoral: pulm edema, bilateral effusions, PNA  -O2 to keep sats greater than 92%  -HD today; Dr. Roanna Banning consulted  -COVID workup    PNA-bilateral  -recent hospitalization  -O2 to keep sats greater than 92%  -rocephin, azithromax  -sputum culture  -lactic neg; afebrile and no leukocytosis  -COVID workup    Pulmonary Edema/Pleural effusions  -unclear source  -for HD today  -Echo (  prev echo in 2014 w/ grade 1 diastolic dysfunction)  -Further mgt as above    ARF on HD: d/t obstructing prostate  -due for HD today  -renal diet  -avoid nephrotoxins  -appreciate nephro input    Anemia  -likely d/t above  -epoetin w/ HD per nephro  -Monitor labs    Hyponatremia  -Suspect d/t ARF  -Monitor labs  -Appreciate nephro input    Thrombocytopenia  -Unclear source  -No s/sx bleeding  -Monitor labs    Hx subclinical hypothyroidism  -Several elevated TSHs but no hx of T3 total or T4 free  -Thyroid studies  -Hold off on starting levothyroxine for now    Chronic fatigue syndrome  -Restart home med once med rec clarified by pharm    Hx GERD  -TUMS prn for now    DVTppx: SCDs  Gippx: TUMS prn  Code Status: Full code  Diet: Renal  Activity: OOB to chair TID and PRN/ PT consult  Discharge: TBD    Signed By: Orlinda Blalock, NP     January 21, 2019

## 2019-01-21 NOTE — Other (Signed)
TRANSFER - OUT REPORT:    Verbal report given to RN (name) on John Friedman  being transferred to 404(unit) for routine progression of care       Report consisted of patient???s Situation, Background, Assessment and   Recommendations(SBAR).     Information from the following report(s) SBAR, ED Summary, [facility] and Recent Results was reviewed with the receiving nurse.    Lines:   Peripheral IV 01/21/19 Left Antecubital (Active)   Site Assessment Clean, dry, & intact 01/21/2019  9:13 AM   Phlebitis Assessment 0 01/21/2019  9:13 AM   Infiltration Assessment 0 01/21/2019  9:13 AM   Dressing Status Clean, dry, & intact 01/21/2019  9:13 AM   Dressing Type Transparent 01/21/2019  9:13 AM   Alcohol Cap Used No 01/21/2019  9:13 AM        Opportunity for questions and clarification was provided.      Patient transported with:

## 2019-01-21 NOTE — Other (Signed)
[facility]  [phone]    Vitals   Pre   Post   Assessment   Pre   Post     Temp  98  97.8 LOC  Alert and oriented x4 Alert and oriented x4   HR   Pulse (Heart Rate): 82 (01/21/19 1945) 80 Lungs   coarse with 3lnc   coarse on 3lnc   B/P   BP: 122/82 (01/21/19 1945) 137/69 Cardiac   B/p wnl,   NSR  b/p wnl   Resp   Resp Rate: 14 (01/21/19 1945) 20 Skin   intact  intact   Pain level  0 0 Edema  Trace in all ext's     Trace in all ext's   Orders:    Duration:   Start:    1955 End:    2255 Total:   3hrs   Dialyzer:   Dialyzer/Set Up Inspection: Revaclear (01/21/19 1945)   K Bath:   Dialysate K (mEq/L): 3 (01/21/19 1945)   Ca Bath:   Dialysate CA (mEq/L): 2.5 (01/21/19 1945)   Na/Bicarb:   Dialysate NA (mEq/L): 140 (01/21/19 1945)   Target Fluid Removal:   Goal/Amount of Fluid to Remove (mL): 3000 mL (01/21/19 1945)   Access     Type & Location:   Rt subclavian perm-cath. Dressing changed, no s/s of infection noted. Each catheter limb disinfected for 60 seconds per limb with alcohol swabs. Caps removed, dialysis CVC hub scrubbed with Prevantics for 5 seconds, followed by a 5 second dry time per Hospital P&P. Blood aspirated and lines flushed without any issues.       Labs     Obtained/Reviewed   Critical Results Called   Date when labs were drawn-  Hgb-    HGB   Date Value Ref Range Status   01/21/2019 7.6 (L) 12.1 - 17.0 g/dL Final     K-    Potassium   Date Value Ref Range Status   01/21/2019 3.9 3.5 - 5.1 mmol/L Final     Ca-   Calcium   Date Value Ref Range Status   01/21/2019 7.5 (L) 8.5 - 10.1 MG/DL Final     Bun-   BUN   Date Value Ref Range Status   01/21/2019 48 (H) 6 - 20 MG/DL Final     Creat-   Creatinine   Date Value Ref Range Status   01/21/2019 9.46 (H) 0.70 - 1.30 MG/DL Final        Medications/ Blood Products Given     Name   Dose   Route and Time     heparin 3600units 1800 units in each port   Retacrit 20000units Given at 2225         Blood Volume Processed (BVP):    64.8 Net Fluid   Removed:  3kg   Comments   Time Out Done: 6237  Primary Nurse Rpt SEG:BTDV Kaur Rn  Primary Nurse Rpt Post: Doran Clay RN  Pt [place] Plan:ESRD  Tx Summary:  1956 dialysis started  2252 Dialysis completed and blood rinsed back. Heparin lock placed in each port with new caps. Pt stable  Admiting Diagnosis:PNA  Pt's previous clinic-[facility]  Consent signed - Informed Consent Verified: Yes (01/21/19 1945)  [facility] Consent - obtained  Hepatitis Status- NEG AG 01/12/19  Machine #- Machine Number: [id number] (01/21/19 1945)  Telemetry status-NSR  Pre-dialysis wt.-

## 2019-01-21 NOTE — ACP (Advance Care Planning) (Signed)
Advance Care Planning Note    Name: John Friedman  Date of birth: 29-Sep-1965  MRN: [id number]  Admission Date: January 22, 2019  8:27 AM    Date of discussion: 22-Jan-2019    Active Diagnoses:    Hospital Problems  Date Reviewed: Jan 22, 2019          Codes Class Noted POA    * (Principal) Hospital acquired PNA ICD-10-CM: J18.9, Y95  ICD-9-CM: 409  01/22/2019 Yes        Hyponatremia ICD-10-CM: E87.1  ICD-9-CM: 276.1  01/22/2019 Yes        Thrombocytopenia ([place]) ICD-10-CM: D69.6  ICD-9-CM: 287.5  01/22/2019 Yes        Anemia ICD-10-CM: D64.9  ICD-9-CM: 285.9  2019/01/22 Yes        Pleural effusion, bilateral ICD-10-CM: J90  ICD-9-CM: 511.9  22-Jan-2019 Yes        Pulmonary edema ICD-10-CM: J81.1  ICD-9-CM: 811  01/22/2019 Yes        History of diastolic dysfunction BJY-78-GN: Z86.79  ICD-9-CM: V12.59  22-Jan-2019 Yes    Overview Signed 01/22/19  1:54 PM by Orlinda Blalock, NP     Chronic             Acute renal failure ([place]) ICD-10-CM: N17.9  ICD-9-CM: 584.9  01/20/2019 Yes    Overview Signed 01/20/2019  8:16 PM by Antony Haste, MD     May '20 - due to urinary retention, seen at [facility]  HD on M/W/F.             Subclinical hypothyroidism ICD-10-CM: E03.9  ICD-9-CM: 244.8  Unknown Yes        GERD (gastroesophageal reflux disease) ICD-10-CM: K21.9  ICD-9-CM: 530.81  Unknown Yes    Overview Signed 09/26/2011  7:16 AM by Antony Haste, MD     Normal EGD (09/09/11)             H/O testicular cancer ICD-10-CM: Z85.47  ICD-9-CM: V10.47  Unknown Yes    Overview Signed 09/19/2011  8:54 AM by Antony Haste, MD     testicular cancer (Stage III)             Chronic fatigue ICD-10-CM: R53.82  ICD-9-CM: 780.79  08/15/1985 Yes               These active diagnoses are of sufficient risk that focused discussion on advance care planning is indicated in order to allow the patient to thoughtfully consider personal goals of care, and if situations arise that  prevent the ability to personally give input, to ensure appropriate representation of their personal desires for different levels and aggressiveness of care.     Discussion:     Persons present and participating in discussion: John Friedman, Orlinda Blalock, NP    Discussion: Patient with significant medical history including testicular cancer status post chemo, subclinical hypothyroidism, chronic fatigue syndrome, grade 1 diastolic heart dysfunction and most recently acute renal failure requiring HD Monday Wednesday and Fridays due to obstructing prostate.  Patient reports being in the hospital recently for 8 days and was discharged this past week.  He almost left AMA in the ED due to not wanting to be admitted to the hospital again.  When discussing CODE STATUS and quality of life he stated his philosophy is "fight like hell".  At the same time he states that he is sick of a renal diet and being in the hospital and wants to eat "water I want like pizza"  and be at home.  His wishes are in conflict with each other.  He reports that he has no family was never married did not have kids and that his mother is 74 and lives in [place].  When asked who he would want to make his decisions he stated "I guess you have to get a hold by mom."  Discussed advanced medical directive and offered to fill this out, patient stated that he can do that later.  Discussed his desire to be at home and eat what he wants and how that conflicts with his desire to "fight like hell."  Patient acknowledges that these do not make sense and that he is very impatient for results.  Discussed bringing in palliative care to help him with goals of care decisions in completing an AMD and he is agreeable to that. Discussed code status including full vs partial vs DNR in detail. Also discussed resuscitation efforts including CPR, defibrillation, intubation and emergency drugs. All patient questions answered. At this time, the  patient wishes to remain a full code.    Time Spent:     Total time spent face-to-face in education and discussion: 21 minutes.     Orlinda Blalock, NP  01/21/2019  2:42 PM

## 2019-01-21 NOTE — ED Notes (Signed)
Referred here by PCP for chest xray to r/o pna.  Pt recently admitted to [facility] for AKI and is due for dialysis today at 330.  +orthopnea

## 2019-01-22 ENCOUNTER — Inpatient Hospital Stay: Admit: 2019-01-22 | Payer: PRIVATE HEALTH INSURANCE | Primary: Internal Medicine

## 2019-01-22 ENCOUNTER — Inpatient Hospital Stay: Payer: PRIVATE HEALTH INSURANCE | Primary: Internal Medicine

## 2019-01-22 LAB — CBC WITH AUTOMATED DIFF
ABS. BASOPHILS: 0 10*3/uL (ref 0.0–0.1)
ABS. EOSINOPHILS: 0.2 10*3/uL (ref 0.0–0.4)
ABS. IMM. GRANS.: 0 10*3/uL (ref 0.00–0.04)
ABS. LYMPHOCYTES: 0.5 10*3/uL — ABNORMAL LOW (ref 0.8–3.5)
ABS. MONOCYTES: 0.6 10*3/uL (ref 0.0–1.0)
ABS. NEUTROPHILS: 3.1 10*3/uL (ref 1.8–8.0)
ABSOLUTE NRBC: 0 10*3/uL (ref 0.00–0.01)
BASOPHILS: 1 % (ref 0–1)
EOSINOPHILS: 4 % (ref 0–7)
HCT: 22.8 % — ABNORMAL LOW (ref 36.6–50.3)
HGB: 7.6 g/dL — ABNORMAL LOW (ref 12.1–17.0)
IMMATURE GRANULOCYTES: 1 % — ABNORMAL HIGH (ref 0.0–0.5)
LYMPHOCYTES: 12 % (ref 12–49)
MCH: 31 PG (ref 26.0–34.0)
MCHC: 33.3 g/dL (ref 30.0–36.5)
MCV: 93.1 FL (ref 80.0–99.0)
MONOCYTES: 13 % (ref 5–13)
MPV: 11.3 FL (ref 8.9–12.9)
NEUTROPHILS: 69 % (ref 32–75)
NRBC: 0 PER 100 WBC
PLATELET: 130 10*3/uL — ABNORMAL LOW (ref 150–400)
RBC: 2.45 M/uL — ABNORMAL LOW (ref 4.10–5.70)
RDW: 12.4 % (ref 11.5–14.5)
WBC: 4.4 10*3/uL (ref 4.1–11.1)

## 2019-01-22 LAB — METABOLIC PANEL, COMPREHENSIVE
A-G Ratio: 1.1 (ref 1.1–2.2)
ALT (SGPT): 24 U/L (ref 12–78)
AST (SGOT): 19 U/L (ref 15–37)
Albumin: 3.1 g/dL — ABNORMAL LOW (ref 3.5–5.0)
Alk. phosphatase: 81 U/L (ref 45–117)
Anion gap: 12 mmol/L (ref 5–15)
BUN/Creatinine ratio: 5 — ABNORMAL LOW (ref 12–20)
BUN: 30 MG/DL — ABNORMAL HIGH (ref 6–20)
Bilirubin, total: 0.4 MG/DL (ref 0.2–1.0)
CO2: 25 mmol/L (ref 21–32)
Calcium: 7.8 MG/DL — ABNORMAL LOW (ref 8.5–10.1)
Chloride: 99 mmol/L (ref 97–108)
Creatinine: 6.53 MG/DL — ABNORMAL HIGH (ref 0.70–1.30)
GFR est AA: 11 mL/min/{1.73_m2} — ABNORMAL LOW (ref >60)
GFR est non-AA: 9 mL/min/{1.73_m2} — ABNORMAL LOW (ref >60)
Globulin: 2.7 g/dL (ref 2.0–4.0)
Glucose: 74 mg/dL (ref 65–100)
Potassium: 3.7 mmol/L (ref 3.5–5.1)
Protein, total: 5.8 g/dL — ABNORMAL LOW (ref 6.4–8.2)
Sodium: 136 mmol/L (ref 136–145)

## 2019-01-22 LAB — RENAL FUNCTION PANEL
Albumin: 3.1 g/dL — ABNORMAL LOW (ref 3.5–5.0)
Albumin: 3.1 g/dL — ABNORMAL LOW (ref 3.5–5.0)
Anion Gap: 11 mmol/L (ref 5–15)
Anion gap: 11 mmol/L (ref 5–15)
BUN/Creatinine ratio: 4 — ABNORMAL LOW (ref 12–20)
BUN: 29 MG/DL — ABNORMAL HIGH (ref 6–20)
BUN: 29 MG/DL — ABNORMAL HIGH (ref 6–20)
Bun/Cre Ratio: 4 — ABNORMAL LOW (ref 12–20)
CO2: 25 mmol/L (ref 21–32)
CO2: 25 mmol/L (ref 21–32)
Calcium: 7.7 MG/DL — ABNORMAL LOW (ref 8.5–10.1)
Calcium: 7.7 MG/DL — ABNORMAL LOW (ref 8.5–10.1)
Chloride: 100 mmol/L (ref 97–108)
Chloride: 100 mmol/L (ref 97–108)
Creatinine: 6.56 MG/DL — ABNORMAL HIGH (ref 0.70–1.30)
Creatinine: 6.56 MG/DL — ABNORMAL HIGH (ref 0.70–1.30)
EGFR IF NonAfrican American: 9 mL/min/{1.73_m2} — ABNORMAL LOW (ref >60)
GFR African American: 11 mL/min/{1.73_m2} — ABNORMAL LOW (ref >60)
GFR est AA: 11 mL/min/{1.73_m2} — ABNORMAL LOW (ref >60)
GFR est non-AA: 9 mL/min/{1.73_m2} — ABNORMAL LOW (ref >60)
Glucose: 77 mg/dL (ref 65–100)
Glucose: 77 mg/dL (ref 65–100)
Phosphorus: 4.9 MG/DL — ABNORMAL HIGH (ref 2.6–4.7)
Phosphorus: 4.9 MG/DL — ABNORMAL HIGH (ref 2.6–4.7)
Potassium: 3.7 mmol/L (ref 3.5–5.1)
Potassium: 3.7 mmol/L (ref 3.5–5.1)
Sodium: 136 mmol/L (ref 136–145)
Sodium: 136 mmol/L (ref 136–145)

## 2019-01-22 LAB — CELL COUNT, BODY FLUID
Eos: 1 % — AB
FLD EOSINS: 1 % — AB
FLD LYMPHS: 16 % — AB
FLD MONO/MACROPHAGES: 74 % — AB
FLD NEUTROPHILS: 1 % — AB
FLUID MESOTHELIAL: 8 % — AB
FLUID NUCLEATED CELLS: 216 /mm3
FLUID RBC CT.: >100 /mm3 — ABNORMAL HIGH
Lymphocytes, Body Fluid: 16 % — AB
Mesothelial, Fluid: 8 % — AB
Mono/Macrophages, Fluid: 74 % — AB
Neutorphils, Fluid: 1 % — AB
Nucl Cell, Fluid: 216 /mm3
RBC, Fluid: >100 /mm3 — ABNORMAL HIGH

## 2019-01-22 LAB — ECHO ADULT COMPLETE
Aortic Root: 2.48 cm
LA Volume BP: 95.6 mL (ref 18–58)
LA Volume Index BP: 45.49 ml/m2 (ref 16–28)
TAPSE: 3.21 cm — AB (ref 1.5–2.0)

## 2019-01-22 LAB — SARS-COV-2: SARS-CoV-2 by PCR: NOT DETECTED

## 2019-01-22 LAB — LDH, BODY FLUID
LD, body fld.: 89 U/L
LD, body fld.: 90 U/L

## 2019-01-22 LAB — PROTHROMBIN TIME + INR
INR: 1.2 — ABNORMAL HIGH (ref 0.9–1.1)
Prothrombin time: 11.8 s — ABNORMAL HIGH (ref 9.0–11.1)

## 2019-01-22 LAB — MAGNESIUM
Magnesium: 2.2 mg/dL (ref 1.6–2.4)
Magnesium: 2.2 mg/dL (ref 1.6–2.4)

## 2019-01-22 LAB — D DIMER: D-dimer: 3.14 mg/L FEU — ABNORMAL HIGH (ref 0.00–0.65)

## 2019-01-22 LAB — T3 TOTAL: T3, total: 57 ng/dL — ABNORMAL LOW (ref 71–180)

## 2019-01-22 LAB — PROTEIN TOTAL, FLUID: Protein total, body fld.: 1.8 g/dL

## 2019-01-22 LAB — PHOSPHORUS
Phosphorus: 4.8 MG/DL — ABNORMAL HIGH (ref 2.6–4.7)
Phosphorus: 4.8 MG/DL — ABNORMAL HIGH (ref 2.6–4.7)

## 2019-01-22 LAB — FIBRINOGEN
Fibrinogen: 351 mg/dL (ref 200–475)
Fibrinogen: 351 mg/dL (ref 200–475)

## 2019-01-22 LAB — PTT: aPTT: 31.6 s (ref 22.1–32.0)

## 2019-01-22 LAB — IRON PROFILE
Iron % saturation: 20 % (ref 20–50)
Iron: 50 ug/dL (ref 35–150)
TIBC: 253 ug/dL (ref 250–450)

## 2019-01-22 LAB — PROTIME-INR
INR: 1.2 — ABNORMAL HIGH (ref 0.9–1.1)
Protime: 11.8 s — ABNORMAL HIGH (ref 9.0–11.1)

## 2019-01-22 LAB — APTT: aPTT: 31.6 s (ref 22.1–32.0)

## 2019-01-22 LAB — COMPREHENSIVE METABOLIC PANEL
ALT: 24 U/L (ref 12–78)
AST: 19 U/L (ref 15–37)
Albumin/Globulin Ratio: 1.1 (ref 1.1–2.2)
Albumin: 3.1 g/dL — ABNORMAL LOW (ref 3.5–5.0)
Alkaline Phosphatase: 81 U/L (ref 45–117)
Anion Gap: 12 mmol/L (ref 5–15)
BUN: 30 MG/DL — ABNORMAL HIGH (ref 6–20)
Bun/Cre Ratio: 5 — ABNORMAL LOW (ref 12–20)
CO2: 25 mmol/L (ref 21–32)
Calcium: 7.8 MG/DL — ABNORMAL LOW (ref 8.5–10.1)
Chloride: 99 mmol/L (ref 97–108)
Creatinine: 6.53 MG/DL — ABNORMAL HIGH (ref 0.70–1.30)
EGFR IF NonAfrican American: 9 mL/min/{1.73_m2} — ABNORMAL LOW (ref >60)
GFR African American: 11 mL/min/{1.73_m2} — ABNORMAL LOW (ref >60)
Globulin: 2.7 g/dL (ref 2.0–4.0)
Glucose: 74 mg/dL (ref 65–100)
Potassium: 3.7 mmol/L (ref 3.5–5.1)
Sodium: 136 mmol/L (ref 136–145)
Total Bilirubin: 0.4 MG/DL (ref 0.2–1.0)
Total Protein: 5.8 g/dL — ABNORMAL LOW (ref 6.4–8.2)

## 2019-01-22 LAB — CBC WITH AUTO DIFFERENTIAL
Basophils %: 1 % (ref 0–1)
Basophils Absolute: 0 10*3/uL (ref 0.0–0.1)
Eosinophils %: 4 % (ref 0–7)
Eosinophils Absolute: 0.2 10*3/uL (ref 0.0–0.4)
Granulocyte Absolute Count: 0 10*3/uL (ref 0.00–0.04)
Hematocrit: 22.8 % — ABNORMAL LOW (ref 36.6–50.3)
Hemoglobin: 7.6 g/dL — ABNORMAL LOW (ref 12.1–17.0)
Immature Granulocytes: 1 % — ABNORMAL HIGH (ref 0.0–0.5)
Lymphocytes %: 12 % (ref 12–49)
Lymphocytes Absolute: 0.5 10*3/uL — ABNORMAL LOW (ref 0.8–3.5)
MCH: 31 PG (ref 26.0–34.0)
MCHC: 33.3 g/dL (ref 30.0–36.5)
MCV: 93.1 FL (ref 80.0–99.0)
MPV: 11.3 FL (ref 8.9–12.9)
Monocytes %: 13 % (ref 5–13)
Monocytes Absolute: 0.6 10*3/uL (ref 0.0–1.0)
NRBC Absolute: 0 10*3/uL (ref 0.00–0.01)
Neutrophils %: 69 % (ref 32–75)
Neutrophils Absolute: 3.1 10*3/uL (ref 1.8–8.0)
Nucleated RBCs: 0 PER 100 WBC
Platelets: 130 10*3/uL — ABNORMAL LOW (ref 150–400)
RBC: 2.45 M/uL — ABNORMAL LOW (ref 4.10–5.70)
RDW: 12.4 % (ref 11.5–14.5)
WBC: 4.4 10*3/uL (ref 4.1–11.1)

## 2019-01-22 LAB — COVID-19: SARS-CoV-2: NOT DETECTED

## 2019-01-22 LAB — LACTATE DEHYDROGENASE, BODY FLUID
LD, Fluid: 89 U/L
LD, Fluid: 90 U/L

## 2019-01-22 LAB — TRANSTHORACIC ECHOCARDIOGRAM (TTE) COMPLETE (CONTRAST/BUBBLE/3D PRN)
Aortic Root: 2.48 cm
LA Volume BP: 95.6 mL (ref 18–58)
LA Volume Index BP: 45.49 ml/m2 (ref 16–28)
Left Ventricular Ejection Fraction: 63
TAPSE: 3.21 cm — AB (ref 1.5–2)

## 2019-01-22 LAB — IRON AND TIBC
Iron Saturation: 20 % (ref 20–50)
Iron: 50 ug/dL (ref 35–150)
TIBC: 253 ug/dL (ref 250–450)

## 2019-01-22 LAB — D-DIMER, QUANTITATIVE: D-Dimer, Quant: 3.14 mg/L FEU — ABNORMAL HIGH (ref 0.00–0.65)

## 2019-01-22 LAB — T3: T3, TOTAL: 57 ng/dL — ABNORMAL LOW (ref 71–180)

## 2019-01-22 LAB — PROTEIN, BODY FLUID: Protein, body fluid: 1.8 g/dL

## 2019-01-22 MED ORDER — PREGABALIN 75 MG CAP
75 mg | Freq: Every evening | ORAL | Status: DC
Start: 2019-01-22 — End: 2019-01-22

## 2019-01-22 MED ORDER — SODIUM BICARBONATE 4.2 % INJ
4.2 % | Freq: Once | INTRAVENOUS | Status: DC
Start: 2019-01-22 — End: 2019-01-22
  Administered 2019-01-22: 15:00:00 via SUBCUTANEOUS

## 2019-01-22 MED ORDER — TAMSULOSIN SR 0.4 MG 24 HR CAP
0.4 mg | Freq: Every day | ORAL | Status: DC
Start: 2019-01-22 — End: 2019-01-22

## 2019-01-22 MED ORDER — LIOTHYRONINE 5 MCG TAB
5 mcg | Freq: Every day | ORAL | Status: DC
Start: 2019-01-22 — End: 2019-01-22

## 2019-01-22 MED ORDER — CLONAZEPAM 1 MG TAB
1 mg | Freq: Two times a day (BID) | ORAL | Status: DC
Start: 2019-01-22 — End: 2019-01-22
  Administered 2019-01-22: 18:00:00 via ORAL

## 2019-01-22 MED ORDER — CEFTRIAXONE 1 GRAM SOLUTION FOR INJECTION
1 gram | INTRAMUSCULAR | Status: DC
Start: 2019-01-22 — End: 2019-01-22
  Administered 2019-01-22: 18:00:00 via INTRAVENOUS

## 2019-01-22 MED ORDER — PROPRANOLOL 20 MG TAB
20 mg | Freq: Every day | ORAL | Status: DC
Start: 2019-01-22 — End: 2019-01-22

## 2019-01-22 MED ORDER — CLOMIPRAMINE 25 MG CAP
25 mg | Freq: Every evening | ORAL | Status: DC
Start: 2019-01-22 — End: 2019-01-22

## 2019-01-22 MED FILL — HEPARIN (PORCINE) 1,000 UNIT/ML IJ SOLN: 1000 unit/mL | INTRAMUSCULAR | Qty: 4

## 2019-01-22 MED FILL — CLONAZEPAM 1 MG TAB: 1 mg | ORAL | Qty: 1

## 2019-01-22 MED FILL — CLOMIPRAMINE 25 MG CAP: 25 mg | ORAL | Qty: 2

## 2019-01-22 MED FILL — SODIUM BICARBONATE 4.2 % INJ: 4.2 % | INTRAVENOUS | Qty: 1

## 2019-01-22 MED FILL — NORMAL SALINE FLUSH 0.9 % INJECTION SYRINGE: INTRAMUSCULAR | Qty: 10

## 2019-01-22 MED FILL — AZITHROMYCIN 500 MG IV SOLUTION: 500 mg | INTRAVENOUS | Qty: 5

## 2019-01-22 MED FILL — CEFTRIAXONE 1 GRAM SOLUTION FOR INJECTION: 1 gram | INTRAMUSCULAR | Qty: 1

## 2019-01-22 NOTE — Discharge Summary (Signed)
Discharge Summary by Olene Floss, DO at 01/22/19 1742                Author: Olene Floss, DO  Service: Hospitalist  Author Type: Physician       Filed: 01/23/19 0610  Date of Service: 01/22/19 1742  Status: Signed          Editor: Olene Floss, DO (Physician)                       Discharge Summary           PATIENT ID: John Friedman   MRN: [id number]    DATE OF BIRTH: [birth year]     DATE OF ADMISSION: 01/21/2019  8:27 AM     DATE OF DISCHARGE: 01/23/2019   PRIMARY CARE PROVIDER: Antony Haste, MD        ATTENDING PHYSICIAN:  Olene Floss, DO    DISCHARGING PROVIDER: Olene Floss, DO     To contact this individual call [phone] and ask the operator to page.  If unavailable ask to be transferred the Adult Hospitalist Department.      CONSULTATIONS: IP CONSULT TO NEPHROLOGY      PROCEDURES/SURGERIES: * No surgery found *      ADMITTING DIAGNOSES & HOSPITAL COURSE:       53 year old male with past history renal failure on HD presented to [facility] with shortness of breath.  Found to have pulmonary edema and bilateral pleural effusions.  In the ED, hypoxic on ambulation.  Admitted for further evaluation.  Nephrology  consulted  And patient underwent hemodialysis.  Interventional radiology consulted and bedside thoracentesis done with 1500 mL removed from right lung.  Patient instructed to follow-up outpatient for additional thoracentesis on the left side.  Patient  felt much better and stable for discharge.      CTA chest:   IMPRESSION:   No evidence of acute pulmonary embolus. Moderately large bilateral pleural   effusions. Moderately severe pulmonary edema pattern. Bilateral nodular airspace   disease and bilateral consolidation may indicate superimposed pneumonia;   radiographic follow-up is recommended to assure resolution.              DISCHARGE DIAGNOSES / PLAN:           Acute hypoxic respiratory failure: resolved   Bilateral pleural effusions, R>L:   Volume  overload with pulmonary edema:    (Pneumonia ruled out):   ARF on HD:   Anemia   Hyponatremia   Thrombocytopenia   Hx subclinical hypothyroidism: outpatient follow up   Chronic fatigue syndrome   Hx GERD      ??   ??        ADDITIONAL CARE RECOMMENDATIONS:    Please follow up for dialysis      Please follow up with your primary care physician. You will need a referral to interventional radiology for left thoracentesis       Resume home medications      Return to the hospital for any new or worsening symptoms      PENDING TEST RESULTS:    At the time of discharge the following test results are still pending: pleural studies, echo      FOLLOW UP APPOINTMENTS:       Follow-up Information               Follow up With  Specialties  Details  Why  Contact Info  [facility] EMERGENCY DEP  Emergency Medicine    If symptoms worsen  [place]              Antony Haste, MD  Internal Medicine  Schedule an appointment as soon as possible for a visit  Office nurse will contact patient with follow up appointment information.   [address]   [address]   [place] [address]   [phone]                 Leontine Locket, MD  Nephrology  Schedule an appointment as soon as possible for a visit    [address]   [address]   [place] [address]   [phone]                       DISCHARGE MEDICATIONS:     Discharge Medication List as of 01/22/2019  4:59 PM              CONTINUE these medications which have NOT CHANGED          Details        clomiPRAMINE (ANAFRANIL) 50 mg capsule  Take 50 mg by mouth nightly., Historical Med               tamsulosin (Flomax) 0.4 mg capsule  Take 0.4 mg by mouth daily., Historical Med               OTHER  Supplemental drink - phosphatidylcholine with omega 3 and 6 and dextrose - to help with tremors., Historical Med               pregabalin (Lyrica) 300 mg capsule  Take 300 mg by mouth nightly. For "sleep disturbances" - pt has had injuries  from falling out of bed during sleep, Historical Med               armodafinil (NUVIGIL) 200 mg tab  Take 1 Tab by mouth daily as needed., Historical Med               propranolol (INDERAL) 20 mg tablet  Take 20 mg by mouth daily. For tremors, Historical Med               clonazepam (KLONOPIN) 1 mg tablet  Take 1 mg by mouth two (2) times a day., Historical Med               liothyronine (CYTOMEL) 5 mcg tablet  Take 5 mcg by mouth daily., Historical Med                            NOTIFY YOUR PHYSICIAN FOR ANY OF THE FOLLOWING:    Fever over 101 degrees for 24 hours.    Chest pain, shortness of breath, fever, chills, nausea, vomiting, diarrhea, change in mentation, falling, weakness, bleeding. Severe pain or pain not relieved by medications.   Or, any other signs or symptoms that you may have questions about.      DISPOSITION:         Home With:     OT    PT    HH    RN                   Long term SNF/Inpatient Rehab       Independent/assisted living  Hospice          Other:           PATIENT CONDITION AT DISCHARGE:       Functional status         Poor        Deconditioned         x  Independent         Cognition        x  Lucid        Forgetful           Dementia         Catheters/lines (plus indication)         Foley        PICC        PEG         x  None         Code status        x  Full code           DNR         PHYSICAL EXAMINATION AT DISCHARGE:   General:          Alert, cooperative, no distress, appears stated age.      Neck:               Supple, symmetrical   Lungs:             diminished breath sounds bilaterally, no increase work of breathing   Chest wall:      No tenderness  No Accessory muscle use.   Heart:              Regular  rhythm,  No  murmur   No edema   Abdomen:        Soft, non-tender. Not distended.  Bowel sounds normal   Psych:             anxious   Neurologic:      Alert, moves all extremities, answers questions appropriately and responds to commands          CHRONIC MEDICAL  DIAGNOSES:      Problem List as of 01/22/2019  Date Reviewed:  09-Feb-2019                        Codes  Class  Noted - Resolved             * (Principal) Hospital acquired PNA  ICD-10-CM: J18.9, Y95   ICD-9-CM: 161    2019-02-09 - Present                       ARF (acute renal failure) ([place])  ICD-10-CM: N17.9   ICD-9-CM: 096.9    Unknown - Present          Overview Signed Feb 09, 2019  1:36 PM by Orlinda Blalock, NP            on HD                                   Hyponatremia  ICD-10-CM: E87.1   ICD-9-CM: 276.1    Feb 09, 2019 - Present                       Thrombocytopenia ([place])  ICD-10-CM: D69.6   ICD-9-CM: 287.5    2019/02/09 - Present  Anemia  ICD-10-CM: D64.9   ICD-9-CM: 285.9    01/21/2019 - Present                       Pleural effusion, bilateral  ICD-10-CM: J90   ICD-9-CM: 511.9    01/21/2019 - Present                       Pulmonary edema  ICD-10-CM: J81.1   ICD-9-CM: 027    01/21/2019 - Present                       History of diastolic dysfunction  OZD-66-YQ: Z86.79   ICD-9-CM: V12.59    01/21/2019 - Present          Overview Signed 01/21/2019  1:54 PM by Orlinda Blalock, NP            Chronic                                   Acute renal failure [facility])  ICD-10-CM: N17.9   ICD-9-CM: 584.9    01/20/2019 - Present          Overview Signed 01/20/2019  8:16 PM by Antony Haste, MD            May '20 - due to urinary retention, seen at [facility].  HD on M/W/F.                                   Irritable bowel syndrome with diarrhea  ICD-10-CM: K58.0   ICD-9-CM: 564.1    05/12/2015 - Present                       Subclinical hypothyroidism  ICD-10-CM: E03.9   ICD-9-CM: 244.8    Unknown - Present                       Essential tremor  ICD-10-CM: G25.0   ICD-9-CM: 333.1    Unknown - Present                       GERD (gastroesophageal reflux disease)  ICD-10-CM: K21.9   ICD-9-CM: 530.81    Unknown - Present          Overview Signed 09/26/2011  7:16 AM by Antony Haste, MD            Normal EGD (09/09/11)                                    H/O testicular cancer  ICD-10-CM: Z85.47   ICD-9-CM: V10.47    Unknown - Present          Overview Signed 09/19/2011  8:54 AM by Antony Haste, MD            testicular cancer (Stage III)                                   Sleep disturbances  ICD-10-CM: G47.9   ICD-9-CM: 780.50    Unknown - Present  Chronic fatigue  ICD-10-CM: R53.82   ICD-9-CM: 780.79    08/15/1985 - Present                       RESOLVED: Weakness of left upper extremity  ICD-10-CM: R29.898   ICD-9-CM: 729.89    06/26/2013 - 06/26/2013                       RESOLVED: Lumbar radiculopathy  ICD-10-CM: M54.16   ICD-9-CM: 724.4    08/10/2012 - 01/01/2016          Overview Signed 08/10/2012  7:48 AM by Antony Haste, MD            S/p steroid injection by Dr Laverta Rio [place] 08/01/12                                      Greater than 30 minutes were spent with the patient on counseling and coordination of care      Signed:    Olene Floss, DO   01/23/2019   5:56 AM

## 2019-01-22 NOTE — Progress Notes (Signed)
Orders received, chart reviewed and patient evaluated by physical therapy. Recommend:  Physical therapy at least 2 days/week in the home     Recommend with nursing patient to complete as able in order to maintain strength, endurance and independence: OOB to chair 3x/day with SBA and ambulating with SBA. Thank you for your assistance.     Full evaluation to follow.

## 2019-01-22 NOTE — ACP (Advance Care Planning) (Signed)
 Palliative Medicine Social Work      Dr. Ruta and I met with patient in his room. He was sitting up in bed, alert and oriented x4.    Patient has had ACP conversations previously. While he is not ready to complete the AMD form, he did verbalize some aspects of it.    Patient thinks he will appoint his mother (adoptive) John Friedman as MPOA1 and his biological mother John Friedman as MPOA. He met John Friedman when he was 1 and has kept up a relationship with her and his biological father John Friedman.    When discussing Living Will section, patient has stated multiple times that he wants to fight. We talked about that being his mantra for how he wants to manage his health care now and also, knowing that one day we will all pass, how would he want to be cared for when doctors are saying there are no other treatment options, death is imminent? Patient said he has specific wishes that he would like to type out and it is something he would want to think about and work on on his own.    Patient thinks he would want to be an organ donor.    We discussed that without an AMD, LNOK guidelines in [place] (in his case as an unmarried man with no kids) indicate that adoptive mom John Friedman would be LNOK1 and sister John Friedman would be LNOK2.     Patient has blank AMD and instructed to provide copy of completed form to his physicians' offices.            Thank you for the opportunity to be involved in the care of John Friedman and his family.    Vernell Human, LMSW, Supervisee in Social Work  Palliative Medicine Social Worker  [phone]

## 2019-01-22 NOTE — Progress Notes (Signed)
1015- pt arrived on floor from [place]. Pt has planned procedure today. Pt provided gown and CHG wipes with instructions for use. MD now in with patient, V/S to be completed when MD finishes with patient.   1130- pt now having procedure done in room.  10- MD paged to see if pt can be discharged today.  1517- pt possible discharged today post chest x ray.

## 2019-01-22 NOTE — Progress Notes (Signed)
Attempted to schedule hospital follow up PCP appointment.  Office nurse will contact patient at home with appointment information.  Mariane Masters, Care Management Specialist.

## 2019-01-22 NOTE — Progress Notes (Signed)
1930: Verbal shift change report given to [employer] (oncoming nurse) by Thom Chimes (offgoing nurse). Report included the following information SBAR, Kardex, Intake/Output, MAR, Recent Results and Cardiac Rhythm NSR.     1935: [name] getting dialysis. Dialysis RN with patient.   2330: [name] done with dialysis. 3L of fluids out per Dialysis RN.

## 2019-01-22 NOTE — Progress Notes (Signed)
 Spiritual Care Assessment/Progress Note  [facility]      NAME: John Friedman      MRN: [id number]  AGE: 53 y.o. SEX: male  Religious Affiliation: Episcopal   Language: English     01/22/2019     Total Time (in minutes): 37     Spiritual Assessment begun in [facility] 5W1 [facility] through conversation with:         [x] Patient        []  Family    []  Friend(s)        Reason for Consult: Initial/Spiritual assessment, patient floor     Spiritual beliefs: (Please include comment if needed)     [x]  Identifies with a faith tradition:    Christian/Episcopal Tradition      []  Supported by a faith community:            []  Claims no spiritual orientation:           []  Seeking spiritual identity:                []  Adheres to an individual form of spirituality:           []  Not able to assess:                           Identified resources for coping:      []  Prayer                               []  Music                  []  Guided Imagery     [x]  Family/friends                 []  Pet visits     []  Devotional reading                         []  Unknown     []  Other:                                             Interventions offered during this visit: (See comments for more details)    Patient Interventions: Affirmation of emotions/emotional suffering, Affirmation of faith, Catharsis/review of pertinent events in supportive environment, Iconic (affirming the presence of God/Higher Power), Initial/Spiritual assessment, patient floor, Normalization of emotional/spiritual concerns, Prayer (assurance of)           Plan of Care:     []  Support spiritual and/or cultural needs    []  Support AMD and/or advance care planning process      []  Support grieving process   []  Coordinate Rites and/or Rituals    []  Coordination with community clergy   [x]  No spiritual needs identified at this time   []  Detailed Plan of Care below (See Comments)  []  Make referral to Music Therapy  []  Make referral to Pet Therapy     []  Make referral to  Addiction services  []  Make referral to [facility] Passages  []  Make referral to Spiritual Care Partner  []  No future visits requested        []  Follow up visits as needed     Comments: Initial visit with patient who  was sitting up in bed and anxious to go home. Presence and care offered. [name] grew up Episcopalian in [place]  and was very active in the [employer] - he has not attended church in years due to his chronic fatigue and moving areas but asked me many questions about my faith and I offered resources to [name] for his own faith journey for which he was very Adult nurse.  He shared how he was without patience and his desire to be discharged today; knowing that he still needs fluid drained from his lungs.  Care manager came in during visit and patient will be discharged today.  [name] shared of his 27 year old mother who lives in [place] who is dear to his heart.  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  He spoke some of his struggles.      Presence and care provided. Assured [name] of support and care as he returns home.  No further spiritual care needs noted as patient being discharged.  Pastoral care is available as needed/desired.  287-PRAY.  Visit by: Rev. Jennifer L. Lazzuri, D.Min, MA, [facility]    Lead Chaplain Profession Development & Advancement  Visit by: Rev. Jennifer L. Lazzuri, D.Min, MA, [facility]    Lead Education officer, environmental & Advancement

## 2019-01-22 NOTE — Progress Notes (Signed)
Progress Notes by Willette Cluster, MD at 01/22/19 1107                Author: Willette Cluster, MD  Service: Nephrology  Author Type: Physician       Filed: 01/22/19 1109  Date of Service: 01/22/19 1107  Status: Signed          Editor: Willette Cluster, MD (Physician)                    Nephrology Progress Note   John Friedman   Date of Admission : 01/21/2019      CC:  Follow up for AKI           Assessment and Plan          AKI:  - unclear cause, progressive CKD vs obstructive uropathy   - HD MWF at [place] tomorrow AM if here      Volume overload:   - improving      Pleural effusions:   - for thoracentesis today      PNA:   - started levaquin   - SARS-CoV-2 neg 6/8      Anemia of CKD:   - ESA w/ HD while here      Obstructive uropathy:   - foley in place   - f/u w/ urology as an outpt              Interval History:   Seen and examined.  Feeling better. Anxious.  For thoracentesis today.  Long discussed re: dialysis needs, likelyhood of recovery, etc.  No reported cp or increase in SOB      Current Medications: all current  Medications have been eviewed in EPIC   Review of Systems: Pertinent items are noted in HPI.      Objective:   Vitals:       Vitals:             01/21/19 2340  01/22/19 0410  01/22/19 0705  01/22/19 1027           BP:  156/88  154/84  (!) 159/99  141/85     Pulse:  83  90  92  89     Resp:  18  16  18  18      Temp:  97.6 ??F (36.4 ??C)  99.2 ??F (37.3 ??C)  98.8 ??F (37.1 ??C)  98.8 ??F (37.1 ??C)     SpO2:  93%  95%  93%  92%           Weight:    90 kg (198 lb 6.6 oz)            Intake and Output:   06/09 0701 - 06/09 1900   In: 360 [P.O.:360]   Out: -    06/07 1901 - 06/09 0700   In: -    Out: 1610 [Urine:550]      Physical Examination:   General: NAD,Conversant    Neck:  Supple, no mass   Resp:  Reduced bibasilar breath sounds   CV:  RRR,  no murmur or rub, mo LE edema   GI:  Soft, NT, + Bowel sounds, no hepatosplenomegaly   Neurologic:  Non focal   Psych:             AAO x  3 appropriate affect \\   Skin:  No Rash   Access:  RIJ PC c/d/i   GU:                  Foley in place      []     High complexity decision making  was performed   []     Patient is at high-risk of  decompensation with multiple organ involvement      Lab Data Personally Reviewed: I have reviewed all the pertinent labs, microbiology data and radiology studies during assessment.        Recent Labs             01/22/19   0422  01/21/19   1802  01/21/19   0834     NA  136   136   --   131*     K  3.7   3.7   --   3.9     CL  99   100   --   93*     CO2  25   25   --   25     GLU  74   77   --   84     BUN  30*   29*   --   48*     CREA  6.53*   6.56*   --   9.46*     CA  7.8*   7.7*   --   7.5*     MG  2.2   --    --      PHOS  4.8*   4.9*   --    --      ALB  3.1*   3.1*   --   3.3*     ALT  24   --   25          INR  1.2*  1.1  1.1          Recent Labs            01/22/19   0422  01/21/19   0834     WBC  4.4  4.8     HGB  7.6*  7.6*     HCT  22.8*  23.6*         PLT  130*  129*        No results found for: SDES     Lab Results         Component  Value  Date/Time            Culture result:  NO GROWTH AFTER 17 HOURS  01/21/2019 10:53 AM          Recent Results (from the past 24 hour(s))     SARS-COV-2          Collection Time: 01/21/19  2:00 PM         Result  Value  Ref Range            Specimen source  Nasopharyngeal          SARS-CoV-2  Not detected  NOTD         PROTHROMBIN TIME + INR          Collection Time: 01/21/19  6:02 PM         Result  Value  Ref Range            INR  1.1  0.9 - 1.1         Prothrombin time  10.9  9.0 - 11.1 sec       PTT          Collection Time: 01/21/19  6:02 PM         Result  Value  Ref Range            aPTT  31.8  22.1 - 32.0 sec       aPTT, therapeutic range       58.0 - 77.0 SECS       FIBRINOGEN          Collection Time: 01/21/19  6:02 PM         Result  Value  Ref Range            Fibrinogen  394  200 - 475 mg/dL       D DIMER          Collection Time: 01/21/19  6:02 PM          Result  Value  Ref Range            D-dimer  2.25 (H)  0.00 - 0.65 mg/L FEU       RENAL FUNCTION PANEL          Collection Time: 01/22/19  4:22 AM         Result  Value  Ref Range            Sodium  136  136 - 145 mmol/L       Potassium  3.7  3.5 - 5.1 mmol/L       Chloride  100  97 - 108 mmol/L       CO2  25  21 - 32 mmol/L       Anion gap  11  5 - 15 mmol/L       Glucose  77  65 - 100 mg/dL       BUN  29 (H)  6 - 20 MG/DL       Creatinine  6.56 (H)  0.70 - 1.30 MG/DL       BUN/Creatinine ratio  4 (L)  12 - 20         GFR est AA  11 (L)  >60 ml/min/1.40m       GFR est non-AA  9 (L)  >60 ml/min/1.76m      Calcium  7.7 (L)  8.5 - 10.1 MG/DL       Phosphorus  4.9 (H)  2.6 - 4.7 MG/DL       Albumin  3.1 (L)  3.5 - 5.0 g/dL       IRON PROFILE          Collection Time: 01/22/19  4:22 AM         Result  Value  Ref Range            Iron  50  35 - 150 ug/dL       TIBC  253  250 - 450 ug/dL       Iron % saturation  20  20 - 50 %       METABOLIC PANEL, COMPREHENSIVE          Collection Time: 01/22/19  4:22 AM         Result  Value  Ref Range            Sodium  136  136 - 145 mmol/L       Potassium  3.7  3.5 - 5.1 mmol/L  Chloride  99  97 - 108 mmol/L       CO2  25  21 - 32 mmol/L       Anion gap  12  5 - 15 mmol/L       Glucose  74  65 - 100 mg/dL       BUN  30 (H)  6 - 20 MG/DL       Creatinine  6.53 (H)  0.70 - 1.30 MG/DL       BUN/Creatinine ratio  5 (L)  12 - 20         GFR est AA  11 (L)  >60 ml/min/1.61m       GFR est non-AA  9 (L)  >60 ml/min/1.755m      Calcium  7.8 (L)  8.5 - 10.1 MG/DL       Bilirubin, total  0.4  0.2 - 1.0 MG/DL       ALT (SGPT)  24  12 - 78 U/L       AST (SGOT)  19  15 - 37 U/L       Alk. phosphatase  81  45 - 117 U/L       Protein, total  5.8 (L)  6.4 - 8.2 g/dL       Albumin  3.1 (L)  3.5 - 5.0 g/dL       Globulin  2.7  2.0 - 4.0 g/dL       A-G Ratio  1.1  1.1 - 2.2         CBC WITH AUTOMATED DIFF          Collection Time: 01/22/19  4:22 AM         Result  Value  Ref Range             WBC  4.4  4.1 - 11.1 K/uL       RBC  2.45 (L)  4.10 - 5.70 M/uL       HGB  7.6 (L)  12.1 - 17.0 g/dL       HCT  22.8 (L)  36.6 - 50.3 %       MCV  93.1  80.0 - 99.0 FL       MCH  31.0  26.0 - 34.0 PG       MCHC  33.3  30.0 - 36.5 g/dL       RDW  12.4  11.5 - 14.5 %       PLATELET  130 (L)  150 - 400 K/uL       MPV  11.3  8.9 - 12.9 FL       NRBC  0.0  0 PER 100 WBC       ABSOLUTE NRBC  0.00  0.00 - 0.01 K/uL       NEUTROPHILS  69  32 - 75 %       LYMPHOCYTES  12  12 - 49 %       MONOCYTES  13  5 - 13 %       EOSINOPHILS  4  0 - 7 %       BASOPHILS  1  0 - 1 %       IMMATURE GRANULOCYTES  1 (H)  0.0 - 0.5 %       ABS. NEUTROPHILS  3.1  1.8 - 8.0 K/UL       ABS. LYMPHOCYTES  0.5 (L)  0.8 - 3.5 K/UL  ABS. MONOCYTES  0.6  0.0 - 1.0 K/UL       ABS. EOSINOPHILS  0.2  0.0 - 0.4 K/UL       ABS. BASOPHILS  0.0  0.0 - 0.1 K/UL       ABS. IMM. GRANS.  0.0  0.00 - 0.04 K/UL       DF  SMEAR SCANNED          PLATELET COMMENTS  Large Platelets          RBC COMMENTS  NORMOCYTIC, NORMOCHROMIC          PROTHROMBIN TIME + INR          Collection Time: 01/22/19  4:22 AM         Result  Value  Ref Range            INR  1.2 (H)  0.9 - 1.1         Prothrombin time  11.8 (H)  9.0 - 11.1 sec       PTT          Collection Time: 01/22/19  4:22 AM         Result  Value  Ref Range            aPTT  31.6  22.1 - 32.0 sec       aPTT, therapeutic range       58.0 - 77.0 SECS       FIBRINOGEN          Collection Time: 01/22/19  4:22 AM         Result  Value  Ref Range            Fibrinogen  351  200 - 475 mg/dL       D DIMER          Collection Time: 01/22/19  4:22 AM         Result  Value  Ref Range            D-dimer  3.14 (H)  0.00 - 0.65 mg/L FEU       MAGNESIUM          Collection Time: 01/22/19  4:22 AM         Result  Value  Ref Range            Magnesium  2.2  1.6 - 2.4 mg/dL       PHOSPHORUS          Collection Time: 01/22/19  4:22 AM         Result  Value  Ref Range            Phosphorus  4.8 (H)  2.6 - 4.7 MG/DL                              Willette Cluster, MD   [facility]     [address] [place] [address]   Phone - [phone]    Fax - [phone]   [email or web address]

## 2019-01-22 NOTE — Consults (Signed)
Palliative Medicine Consult  [place]: [phone])    Patient Name: John Friedman  Date of Birth: 1966-05-31    Date of Initial Consult: 01/22/2019  Reason for Consult: Care decisions  Requesting Provider: Dr. Arnoldo Friedman  Primary Care Physician: John Haste, MD     SUMMARY:   John Friedman is a 53 y.o. 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 Current medical issues leading to Palliative Medicine involvement include: Care decisions.    Chart reviewed???patient is a 53 year old male who was recently at [facility] secondary to obstructive uropathy with associated acute renal failure.  He had to initiate dialysis.  He has been home for a couple days presents back with increased shortness of breath.  Work-up in the emergency room concerning for bilateral pleural effusions.  He underwent thoracentesis with 1.5 L removed on the right.  He also underwent dialysis on the night of admission.  Our team has been asked to see him for goals of care    Social history???patient with a complex social history.  He was adopted at birth.  His adopted father was an OB/GYN and was from [place].  He initially lived in [place] but then his adoptive parents divorced and he moved to [place] to live with his mom and adopted sister.  His mom continues to live in [place] as well as his adopted sister.  At age 46, his biological mother was actually able to contact him.  After meeting him, she realized that she married his biological father.  His biological father is a retired Doctor, general practice currently living in [place].  He has 2 biological brothers that live in [place].  He works for [employer]:   1. Goals of care discussion  2. Advance care planning  3. End-stage renal disease with obstructive uropathy???he is hopeful this will improve  4. DNR discussion  5. History of testicular cancer  6. Bilateral pleural effusions???status post thoracentesis       PLAN:   1. John Friedman, palliative medicine social worker, and I met with patient.  Reviewed the role of palliative medicine and then discussed his current medical issues.  Patient well versed in his medical problems.  He quite frankly would like to return home today if at all possible.  He provides a great detail of his history???both medical and social history.  He remains hopeful that dialysis will not be permanent but if it is, he already states he will go to [facility] for a transplant evaluation.  2. Goals of care???very clear on full attempts at restorative measures.  He states he will "fight like he will ".  3. Advance care planning???patient has no children.  His mother is still living in [place] at 45 years of age.  His adopted sister also lives in [place].  We explained that they would be his legal next of kin.  We reviewed in great detail and advanced medical directive and what this allows him to do.  He thinks he would name his adoptive mother as primary medical power of attorney, his biological mother is secondary, and his adopted sister likely has tertiary.  John Friedman explained the importance of completing the advanced medical directive.  He wanted to take this home to review and type some additional request for the advanced medical directive.  We reviewed again the need for 2 witnesses that are not assigned as medical POA's.  4. CODE STATUS???clear on wanting to remain full code  5. Symptom management???appears that shortness of breath has improved with thoracentesis.  No acute symptoms for [place] to manage  6. Psychosocial??? patient does not have a lot of social support in the area.  Family still lives in [place].  No spiritual  concerns identified  7. Discussed with bedside nurse  8. Initial consult note routed to primary continuity provider and/or primary health care team members  9. Communicated plan of care with: Palliative IDT, [facility] Team     GOALS OF CARE / TREATMENT PREFERENCES:     GOALS OF CARE:  Patient/Health Care Proxy Stated Goals: Prolong life    TREATMENT PREFERENCES:   Code Status: Full Code    Advance Care Planning:  [x] The Pall Med Interdisciplinary Team has updated the ACP Navigator with Health Care Decision Maker and Patient Capacity      Primary Decision Maker: John Friedman - Parent - [phone]    Secondary Decision Maker: John Friedman Sister - [phone]  Advance Care Planning 01/22/2019   Patient's [place] is: Legal Next of [place] Directive None       Medical Interventions: Full interventions     Other Instructions:         Other:    As far as possible, the palliative care team has discussed with patient / health care proxy about goals of care / treatment preferences for patient.     HISTORY:     History obtained from: Chart, patient    CHIEF COMPLAINT: Shortness of breath    HPI/SUBJECTIVE:    The patient is:   [x] Verbal and participatory  [] Non-participatory due to:   Patient very verbose.  Denies any pain.  Feels like his breathing has improved    Clinical Pain Assessment (nonverbal scale for severity on nonverbal patients):   Clinical Pain Assessment  Severity: 0          Duration: for how long has pt been experiencing pain (e.g., 2 days, 1 month, years)  Frequency: how often pain is an issue (e.g., several times per day, once every few days, constant)     FUNCTIONAL ASSESSMENT:     Palliative Performance Scale (PPS):  PPS: 70       PSYCHOSOCIAL/SPIRITUAL SCREENING:     Palliative IDT has assessed this patient for cultural preferences / practices and a referral made as appropriate to needs Orthoptist, Patient Advocacy, Ethics,  etc.)    Any spiritual / religious concerns:  [] Yes /  [x] No    Caregiver Burnout:  [] Yes /  [x] No /  [] No Caregiver Present      Anticipatory grief assessment:   [x] Normal  / [] Maladaptive       ESAS Anxiety: Anxiety: 0    ESAS Depression: Depression: 0        REVIEW OF SYSTEMS:     Positive and pertinent negative findings in ROS are noted above in HPI.  The following systems were [x] reviewed / [] unable to be reviewed as noted in HPI  Other findings are noted below.  Systems: constitutional, ears/nose/mouth/throat, respiratory, gastrointestinal, genitourinary, musculoskeletal, integumentary, neurologic, psychiatric, endocrine. Positive findings noted below.  Modified  ESAS Completed by: provider   Fatigue: 1 Drowsiness: 0   Depression: 0 Pain: 0   Anxiety: 0 Nausea: 0   Anorexia: 0 Dyspnea: 1     Constipation: No              PHYSICAL EXAM:     From RN flowsheet:  Wt Readings from Last 3 Encounters:   01/22/19 198 lb 6.6 oz (90 kg)   10/09/18 183 lb (83 kg)   04/03/18 180 lb 6.4 oz (81.8 kg)     Blood pressure 141/85, pulse 89, temperature 98.8 ??F (37.1 ??C), resp. rate 18, height 5' 11" (1.803 m), weight 198 lb 6.6 oz (90 kg), SpO2 98 %.    Pain Scale 1: Numeric (0 - 10)  Pain Intensity 1: 0                 Last bowel movement, if known:     Constitutional: Alert and oriented, no acute distress  Eyes: pupils equal, anicteric  ENMT: no nasal discharge, moist mucous membranes  Cardiovascular: regular rhythm, distal pulses intact  Respiratory: breathing not labored, decreased at the bases  Gastrointestinal: soft non-tender, +bowel sounds  Musculoskeletal: no deformity, no tenderness to palpation  Skin: warm, dry  Neurologic: following commands, moving all extremities  Psychiatric: full affect, no hallucinations  Other:       HISTORY:     Principal Problem:    Hospital acquired PNA (01/21/2019)    Active Problems:    Subclinical hypothyroidism ()      Chronic fatigue (08/15/1985)      GERD (gastroesophageal reflux  disease) ()      Overview: Normal EGD (09/09/11)      H/O testicular cancer ()      Overview: testicular cancer (Stage III)      Acute renal failure (HCC) (01/20/2019)      Overview: May '20 - due to urinary retention, seen at [facility].  HD on M/W/F.      Hyponatremia (01/21/2019)      Thrombocytopenia ([place]) (01/21/2019)      Anemia (01/21/2019)      Pleural effusion, bilateral (01/21/2019)      Pulmonary edema (01/21/2019)      History of diastolic dysfunction (6/0/6301)      Overview: Chronic      Past Medical History:   Diagnosis Date   ??? Anemia 01/21/2019   ??? ARF (acute renal failure) ([place])     on HD   ??? BPH (benign prostatic hyperplasia)    ??? Cancer ([place])     left testicle- stage III, nonseminomatous germ cell tumor, s/p orchiectomy & BEP x 3 cycles   ??? Chronic fatigue January 1987   ??? Essential tremor    ??? GERD (gastroesophageal reflux disease)    ??? 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 Hyponatremia 01/21/2019   ??? Hypothyroid    ??? 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 Lumbar radiculopathy 08/10/2012    S/p steroid injection by Dr Laverta Alachua 08/01/12    ??? Normal cardiac stress test 09/05/11   ??? Pleural effusion, bilateral 01/21/2019   ??? Pulmonary edema 01/21/2019   ??? Sleep disturbances     acting out in his sleep   ??? Thrombocytopenia ([place]) 01/21/2019      Past Surgical History:   Procedure Laterality Date   ??? HX CHOLECYSTECTOMY  12/05/2011    abnormal HIDA scan (EF < 2%)   ???  HX COLONOSCOPY  07/12/10    hyperplastic polyp, repeat 10 yrs   ??? HX COLONOSCOPY  02/23/15    normal   ??? HX CYST REMOVAL Right 01/06/2016    3rd toe ganglion cyst removal - podiatry (Dr Raliegh Ip)   ??? HX GI  09/09/11    EGD- normal   ??? HX HEENT Left 06/2012    tonsilar bx: benign tissue (Dr Buddy Duty)   ??? HX ORCHIECTOMY Left 2002   ??? HX ORTHOPAEDIC      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38   ??? HX SHOULDER ARTHROSCOPY Right 08/13/2018      Family History   Problem Relation Age of Onset   ??? No Known Problems Mother    ??? Parkinsonism Father    ??? Arthritis-osteo  Father         scoliosis   ??? No Known Problems Brother    ??? No Known Problems Brother    ??? No Known Problems Sister    ??? Cancer Maternal Grandfather         prostate   ??? Cancer Paternal Grandfather         prostate      History reviewed, no pertinent family history.  Social History     Tobacco Use   ??? Smoking status: Never Smoker   ??? Smokeless tobacco: Never Used   Substance Use Topics   ??? Alcohol use: Yes     Comment: rarely     Allergies   Allergen Reactions   ??? Latex Other (comments)     redness      Current Facility-Administered Medications   Medication Dose Route Frequency   ??? cefTRIAXone (ROCEPHIN) 1 g in 0.9% sodium chloride (MBP/ADV) 50 mL  1 g IntraVENous Q24H   ??? sodium bicarbonate (4.2%) injection 84 mg  2 mL SubCUTAneous RAD ONCE   ??? clomiPRAMINE (ANAFRANIL) capsule 50 mg  50 mg Oral QHS   ??? clonazePAM (KlonoPIN) tablet 1 mg  1 mg Oral BID   ??? [START ON 01/23/2019] liothyronine (CYTOMEL) tablet 5 mcg  5 mcg Oral DAILY   ??? pregabalin (LYRICA) capsule 300 mg  300 mg Oral QHS   ??? [START ON 01/23/2019] propranoloL (INDERAL) tablet 20 mg  20 mg Oral DAILY   ??? [START ON 01/23/2019] tamsulosin (FLOMAX) capsule 0.4 mg  0.4 mg Oral DAILY   ??? epoetin alfa-epbx (RETACRIT) injection 20,000 Units  20,000 Units SubCUTAneous DIALYSIS MON, WED & FRI   ??? sodium chloride (NS) flush 5-40 mL  5-40 mL IntraVENous Q8H   ??? sodium chloride (NS) flush 5-40 mL  5-40 mL IntraVENous PRN   ??? acetaminophen (TYLENOL) tablet 650 mg  650 mg Oral Q4H PRN   ??? azithromycin (ZITHROMAX) 500 mg in 0.9% sodium chloride (MBP/ADV) 250 mL  500 mg IntraVENous Q24H   ??? heparin (porcine) 1,000 unit/mL injection 3,600 Units  3,600 Units Hemodialysis DIALYSIS PRN          LAB AND IMAGING FINDINGS:     Lab Results   Component Value Date/Time    WBC 4.4 01/22/2019 04:22 AM    HGB 7.6 (L) 01/22/2019 04:22 AM    PLATELET 130 (L) 01/22/2019 04:22 AM     Lab Results   Component Value Date/Time    Sodium 136 01/22/2019 04:22 AM    Sodium 136 01/22/2019 04:22 AM     Potassium 3.7 01/22/2019 04:22 AM    Potassium 3.7 01/22/2019 04:22 AM    Chloride 100 01/22/2019  04:22 AM    Chloride 99 01/22/2019 04:22 AM    CO2 25 01/22/2019 04:22 AM    CO2 25 01/22/2019 04:22 AM    BUN 29 (H) 01/22/2019 04:22 AM    BUN 30 (H) 01/22/2019 04:22 AM    Creatinine 6.56 (H) 01/22/2019 04:22 AM    Creatinine 6.53 (H) 01/22/2019 04:22 AM    Calcium 7.7 (L) 01/22/2019 04:22 AM    Calcium 7.8 (L) 01/22/2019 04:22 AM    Magnesium 2.2 01/22/2019 04:22 AM    Phosphorus 4.9 (H) 01/22/2019 04:22 AM    Phosphorus 4.8 (H) 01/22/2019 04:22 AM      Lab Results   Component Value Date/Time    Alk. phosphatase 81 01/22/2019 04:22 AM    Protein, total 5.8 (L) 01/22/2019 04:22 AM    Albumin 3.1 (L) 01/22/2019 04:22 AM    Albumin 3.1 (L) 01/22/2019 04:22 AM    Globulin 2.7 01/22/2019 04:22 AM     Lab Results   Component Value Date/Time    INR 1.2 (H) 01/22/2019 04:22 AM    Prothrombin time 11.8 (H) 01/22/2019 04:22 AM    aPTT 31.6 01/22/2019 04:22 AM      Lab Results   Component Value Date/Time    Iron 50 01/22/2019 04:22 AM    TIBC 253 01/22/2019 04:22 AM    Iron % saturation 20 01/22/2019 04:22 AM    Ferritin 215 01/21/2019 08:34 AM      No results found for: PH, PCO2, PO2  No components found for: [facility]   Lab Results   Component Value Date/Time    CK 251 06/26/2013 02:45 AM    CK - MB 2.3 06/26/2013 02:45 AM                Total time: 70  Counseling / coordination time, spent as noted above: 65  > 50% counseling / coordination?: yes    Prolonged service was provided for  []30 min   []75 min in face to face time in the presence of the patient, spent as noted above.  Time Start:   Time End:   Note: this can only be billed with [phone] (initial) or [phone] (follow up).  If multiple start / stop times, list each separately.

## 2019-01-22 NOTE — Progress Notes (Signed)
Order received to discharge from [place].Marland Kitchen

## 2019-01-22 NOTE — Progress Notes (Signed)
TOC   -RUR 20%  -Patient has discharge orders  -PT recommended HH, patient politely declined.   -Patient will transport himself.     CM noted discharge orders. PT recommend HH. Patient politely declined home health. Patient is from home. He is independent and on room air. PCP is Dr. Madilyn Fireman and he uses [facility] [address]     CM will follow     Pierce Crane, MHA/CRM

## 2019-01-22 NOTE — Progress Notes (Signed)
Spiritual Care Assessment/Progress Note  [facility]      NAME: John Friedman      MRN: [id number]  AGE: 53 y.o. SEX: male  Religious Affiliation: Episcopal   Language: English     01/22/2019     Total Time (in minutes): 5     Spiritual Assessment begun in [facility] 5W1 ORTHO SPINE through conversation with:         [] Patient        []  Family    []  Friend(s)        Reason for Consult: Palliative Care, Initial/Spiritual Assessment     Spiritual beliefs: (Please include comment if needed)     []  Identifies with a faith tradition:         []  Supported by a faith community:            []  Claims no spiritual orientation:           []  Seeking spiritual identity:                []  Adheres to an individual form of spirituality:           [x]  Not able to assess: staff offering care                          Identified resources for coping:      []  Prayer                               []  Music                  []  Guided Imagery     []  Family/friends                 []  Pet visits     []  Devotional reading                         [x]  Unknown     []  Other                                              Interventions offered during this visit: (See comments for more details)    Patient Interventions: Initial visit           Plan of Care:     []  Support spiritual and/or cultural needs    []  Support AMD and/or advance care planning process      []  Support grieving process   []  Coordinate Rites and/or Rituals    []  Coordination with community clergy   []  No spiritual needs identified at this time   []  Detailed Plan of Care below (See Comments)  []  Make referral to Music Therapy  []  Make referral to Pet Therapy     []  Make referral to Addiction services  []  Make referral to [place]  []  Make referral to Spiritual Care Partner  []  No future visits requested        [x]  Follow up visits as needed     Comments: Attempted visit with John Friedman.  Pt was receiving care from staff. Chaplains will attempt to visit again as  able.    Amy Sid Falcon, Palliative Chaplain

## 2019-01-22 NOTE — Consults (Signed)
Palliative Medicine Consult  [place]: [phone])    Patient Name: John Friedman  Date of Birth: November 23, 1965    Date of Initial Consult: 01/22/2019  Reason for Consult: Care decisions  Requesting Provider: Dr. Arnoldo Morale  Primary Care Physician: Antony Haste, MD     SUMMARY:   John Friedman is a 53 y.o. 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 Current medical issues leading to Palliative Medicine involvement include: Care decisions.    Chart reviewed???patient is a 53 year old male who was recently at [facility] secondary to obstructive uropathy with associated acute renal failure.  He had to initiate dialysis.  He has been home for a couple days presents back with increased shortness of breath.  Work-up in the emergency room concerning for bilateral pleural effusions.  He underwent thoracentesis with 1.5 L removed on the right.  He also underwent dialysis on the night of admission.  Our team has been asked to see him for goals of care    Social history???patient with a complex social history.  He was adopted at birth.  His adopted father was an OB/GYN and was from [place].  He initially lived in  but then his adoptive parents divorced and he moved to [place] to live with his mom and adopted sister.  His mom continues to live in [place] as well as his adopted sister.  At age 48, his biological mother was actually able to contact him.  After meeting him, she realized that she married his biological father.  His biological father is a retired Doctor, general practice currently living in [place].  He has 2 biological brothers that live in [place].  He works for [employer]:   1. Goals of care discussion  2. Advance care planning  3. End-stage renal disease with obstructive uropathy???he is hopeful this will improve  4. DNR discussion  5. History of testicular cancer  6. Bilateral pleural effusions???status post thoracentesis       PLAN:   1. [name], palliative medicine social worker, and I met with patient.  Reviewed the role of palliative medicine and then discussed his current medical issues.  Patient well versed in his medical problems.  He quite frankly would like to return home today if at all possible.  He provides a great detail of his history???both medical and social history.  He remains hopeful that dialysis will not be permanent but if it is, he already states he will go to [facility] for a transplant evaluation.  2. Goals of care???very clear on full attempts at restorative measures.  He states he will "fight like he will ".  3. Advance care planning???patient has no children.  His mother is still living in [place] at 35 years of age.  His adopted sister also lives in [place].  We explained that they would be his legal next of kin.  We reviewed in great detail and advanced medical directive and what this allows him to do.  He thinks he would name his adoptive mother as primary medical power of attorney, his biological mother is secondary, and his adopted sister likely has tertiary.  [name] explained the importance of completing the advanced medical directive.  He wanted to take this home to review and type some additional request for the advanced medical directive.  We reviewed again the need for 2 witnesses that are not assigned as medical POA's.  4. CODE STATUS???clear on wanting to remain full code  5. Symptom management???appears that shortness of breath has improved with thoracentesis.  No acute symptoms for [place] to manage  6. Psychosocial??? patient does not have a lot of social support in the  area.  Family still lives in [place].  No spiritual concerns identified  7. Discussed with bedside nurse  8. Initial consult note routed to primary continuity provider and/or primary health care team members  9. Communicated plan of care with: Palliative IDT, [place] Team     GOALS OF CARE / TREATMENT PREFERENCES:     GOALS OF CARE:  Patient/Health Care Proxy Stated Goals: Prolong life    TREATMENT PREFERENCES:   Code Status: Full Code    Advance Care Planning:  '[x]'$  The Pall Med Interdisciplinary Team has updated the ACP Navigator with Health Care Decision Maker and Patient Capacity      Primary Decision Maker: Zada Girt - Parent - [phone]    Secondary Decision Maker: [place], [place] Sister - [phone]  Advance Care Planning 01/22/2019   Patient's [place] is: Legal Next of [place] Directive None       Medical Interventions: Full interventions     Other Instructions:         Other:    As far as possible, the palliative care team has discussed with patient / health care proxy about goals of care / treatment preferences for patient.     HISTORY:     History obtained from: Chart, patient    CHIEF COMPLAINT: Shortness of breath    HPI/SUBJECTIVE:    The patient is:   '[x]'$  Verbal and participatory  '[]'$  Non-participatory due to:   Patient very verbose.  Denies any pain.  Feels like his breathing has improved    Clinical Pain Assessment (nonverbal scale for severity on nonverbal patients):   Clinical Pain Assessment  Severity: 0          Duration: for how long has pt been experiencing pain (e.g., 2 days, 1 month, years)  Frequency: how often pain is an issue (e.g., several times per day, once every few days, constant)     FUNCTIONAL ASSESSMENT:     Palliative Performance Scale (PPS):  PPS: 70       PSYCHOSOCIAL/SPIRITUAL SCREENING:     Palliative IDT has assessed this patient for cultural preferences /  practices and a referral made as appropriate to needs Orthoptist, Patient Advocacy, Ethics, etc.)    Any spiritual / religious concerns:  '[]'$  Yes /  '[x]'$  No    Caregiver Burnout:  '[]'$  Yes /  '[x]'$  No /  '[]'$  No Caregiver Present      Anticipatory grief assessment:   '[x]'$  Normal  / '[]'$  Maladaptive       ESAS Anxiety: Anxiety: 0    ESAS Depression: Depression: 0        REVIEW OF SYSTEMS:     Positive and pertinent negative findings in ROS are noted above in HPI.  The following systems were '[x]'$  reviewed / '[]'$  unable to be reviewed as noted in HPI  Other findings are noted below.  Systems: constitutional, ears/nose/mouth/throat, respiratory, gastrointestinal, genitourinary, musculoskeletal, integumentary, neurologic, psychiatric, endocrine. Positive findings noted below.  Modified  ESAS Completed by: provider   Fatigue: 1 Drowsiness: 0   Depression: 0 Pain: 0   Anxiety: 0 Nausea: 0   Anorexia: 0 Dyspnea: 1     Constipation: No              PHYSICAL EXAM:     From RN flowsheet:  Wt Readings from Last 3 Encounters:   01/22/19 198 lb 6.6 oz (90 kg)   10/09/18 183 lb (83 kg)   04/03/18 180 lb 6.4 oz (81.8 kg)     Blood pressure 141/85, pulse 89, temperature 98.8 ??F (37.1 ??C), resp. rate 18, height '5\' 11"'$  (1.803 m), weight 198 lb 6.6 oz (90 kg), SpO2 98 %.    Pain Scale 1: Numeric (0 - 10)  Pain Intensity 1: 0                 Last bowel movement, if known:     Constitutional: Alert and oriented, no acute distress  Eyes: pupils equal, anicteric  ENMT: no nasal discharge, moist mucous membranes  Cardiovascular: regular rhythm, distal pulses intact  Respiratory: breathing not labored, decreased at the bases  Gastrointestinal: soft non-tender, +bowel sounds  Musculoskeletal: no deformity, no tenderness to palpation  Skin: warm, dry  Neurologic: following commands, moving all extremities  Psychiatric: full affect, no hallucinations  Other:       HISTORY:     Principal Problem:    Hospital acquired PNA (01/21/2019)    Active Problems:     Subclinical hypothyroidism ()      Chronic fatigue (08/15/1985)      GERD (gastroesophageal reflux disease) ()      Overview: Normal EGD (09/09/11)      H/O testicular cancer ()      Overview: testicular cancer (Stage III)      Acute renal failure (HCC) (01/20/2019)      Overview: May '20 - due to urinary retention, seen at [facility].  HD on M/W/F.      Hyponatremia (01/21/2019)      Thrombocytopenia ([place]) (01/21/2019)      Anemia (01/21/2019)      Pleural effusion, bilateral (01/21/2019)      Pulmonary edema (01/21/2019)      History of diastolic dysfunction (11/18/5033)      Overview: Chronic      Past Medical History:   Diagnosis Date   ??? Anemia 01/21/2019   ??? ARF (acute renal failure) ([place])     on HD   ??? BPH (benign prostatic hyperplasia)    ??? Cancer ([place])     left testicle- stage III, nonseminomatous germ cell tumor, s/p orchiectomy & BEP x 3 cycles   ??? Chronic fatigue January 1987   ??? Essential tremor    ??? GERD (gastroesophageal reflux disease)    ??? 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 Hyponatremia 01/21/2019   ??? Hypothyroid    ??? 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 Lumbar radiculopathy 08/10/2012    S/p steroid injection by Dr Laverta Benld 08/01/12    ??? Normal cardiac stress test 09/05/11   ??? Pleural effusion, bilateral 01/21/2019   ??? Pulmonary edema 01/21/2019   ??? Sleep disturbances     acting out in his sleep   ??? Thrombocytopenia ([place]) 01/21/2019      Past Surgical History:   Procedure Laterality Date   ??? HX CHOLECYSTECTOMY  12/05/2011    abnormal HIDA scan (EF < 2%)   ???  HX COLONOSCOPY  07/12/10    hyperplastic polyp, repeat 10 yrs   ??? HX COLONOSCOPY  02/23/15    normal   ??? HX CYST REMOVAL Right 01/06/2016    3rd toe ganglion cyst removal - podiatry (Dr Raliegh Ip)   ??? HX GI  09/09/11    EGD- normal   ??? HX HEENT Left 06/2012    tonsilar bx: benign tissue (Dr Buddy Duty)   ??? HX ORCHIECTOMY Left 2002   ??? HX ORTHOPAEDIC      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28   ??? HX SHOULDER ARTHROSCOPY Right 08/13/2018      Family History    Problem Relation Age of Onset   ??? No Known Problems Mother    ??? Parkinsonism Father    ??? Arthritis-osteo Father         scoliosis   ??? No Known Problems Brother    ??? No Known Problems Brother    ??? No Known Problems Sister    ??? Cancer Maternal Grandfather         prostate   ??? Cancer Paternal Grandfather         prostate      History reviewed, no pertinent family history.  Social History     Tobacco Use   ??? Smoking status: Never Smoker   ??? Smokeless tobacco: Never Used   Substance Use Topics   ??? Alcohol use: Yes     Comment: rarely     Allergies   Allergen Reactions   ??? Latex Other (comments)     redness      Current Facility-Administered Medications   Medication Dose Route Frequency   ??? cefTRIAXone (ROCEPHIN) 1 g in 0.9% sodium chloride (MBP/ADV) 50 mL  1 g IntraVENous Q24H   ??? sodium bicarbonate (4.2%) injection 84 mg  2 mL SubCUTAneous RAD ONCE   ??? clomiPRAMINE (ANAFRANIL) capsule 50 mg  50 mg Oral QHS   ??? clonazePAM (KlonoPIN) tablet 1 mg  1 mg Oral BID   ??? [START ON 01/23/2019] liothyronine (CYTOMEL) tablet 5 mcg  5 mcg Oral DAILY   ??? pregabalin (LYRICA) capsule 300 mg  300 mg Oral QHS   ??? [START ON 01/23/2019] propranoloL (INDERAL) tablet 20 mg  20 mg Oral DAILY   ??? [START ON 01/23/2019] tamsulosin (FLOMAX) capsule 0.4 mg  0.4 mg Oral DAILY   ??? epoetin alfa-epbx (RETACRIT) injection 20,000 Units  20,000 Units SubCUTAneous DIALYSIS MON, WED & FRI   ??? sodium chloride (NS) flush 5-40 mL  5-40 mL IntraVENous Q8H   ??? sodium chloride (NS) flush 5-40 mL  5-40 mL IntraVENous PRN   ??? acetaminophen (TYLENOL) tablet 650 mg  650 mg Oral Q4H PRN   ??? azithromycin (ZITHROMAX) 500 mg in 0.9% sodium chloride (MBP/ADV) 250 mL  500 mg IntraVENous Q24H   ??? heparin (porcine) 1,000 unit/mL injection 3,600 Units  3,600 Units Hemodialysis DIALYSIS PRN          LAB AND IMAGING FINDINGS:     Lab Results   Component Value Date/Time    WBC 4.4 01/22/2019 04:22 AM    HGB 7.6 (L) 01/22/2019 04:22 AM    PLATELET 130 (L) 01/22/2019 04:22 AM      Lab Results   Component Value Date/Time    Sodium 136 01/22/2019 04:22 AM    Sodium 136 01/22/2019 04:22 AM    Potassium 3.7 01/22/2019 04:22 AM    Potassium 3.7 01/22/2019 04:22 AM    Chloride 100 01/22/2019  04:22 AM    Chloride 99 01/22/2019 04:22 AM    CO2 25 01/22/2019 04:22 AM    CO2 25 01/22/2019 04:22 AM    BUN 29 (H) 01/22/2019 04:22 AM    BUN 30 (H) 01/22/2019 04:22 AM    Creatinine 6.56 (H) 01/22/2019 04:22 AM    Creatinine 6.53 (H) 01/22/2019 04:22 AM    Calcium 7.7 (L) 01/22/2019 04:22 AM    Calcium 7.8 (L) 01/22/2019 04:22 AM    Magnesium 2.2 01/22/2019 04:22 AM    Phosphorus 4.9 (H) 01/22/2019 04:22 AM    Phosphorus 4.8 (H) 01/22/2019 04:22 AM      Lab Results   Component Value Date/Time    Alk. phosphatase 81 01/22/2019 04:22 AM    Protein, total 5.8 (L) 01/22/2019 04:22 AM    Albumin 3.1 (L) 01/22/2019 04:22 AM    Albumin 3.1 (L) 01/22/2019 04:22 AM    Globulin 2.7 01/22/2019 04:22 AM     Lab Results   Component Value Date/Time    INR 1.2 (H) 01/22/2019 04:22 AM    Prothrombin time 11.8 (H) 01/22/2019 04:22 AM    aPTT 31.6 01/22/2019 04:22 AM      Lab Results   Component Value Date/Time    Iron 50 01/22/2019 04:22 AM    TIBC 253 01/22/2019 04:22 AM    Iron % saturation 20 01/22/2019 04:22 AM    Ferritin 215 01/21/2019 08:34 AM      No results found for: PH, PCO2, PO2  No components found for: [facility]   Lab Results   Component Value Date/Time    CK 251 06/26/2013 02:45 AM    CK - MB 2.3 06/26/2013 02:45 AM                Total time: 70  Counseling / coordination time, spent as noted above: 65  > 50% counseling / coordination?: yes    Prolonged service was provided for  []30 min   []75 min in face to face time in the presence of the patient, spent as noted above.  Time Start:   Time End:   Note: this can only be billed with [phone] (initial) or [phone] (follow up).  If multiple start / stop times, list each separately.

## 2019-01-22 NOTE — Telephone Encounter (Signed)
Call to [facility], advised of Dr Amedeo Plenty' note.  She requested we follow through with this with the patient as he is being discharged.  Told her once we figured out the logistics of the visit would call patient.

## 2019-01-22 NOTE — Progress Notes (Signed)
Spiritual Care Assessment/Progress Note  [facility]      NAME: John Friedman      MRN: [id number]  AGE: 53 y.o. SEX: male  Religious Affiliation: Episcopal   Language: English     01/22/2019     Total Time (in minutes): 37     Spiritual Assessment begun in [facility] 5W1 ORTHO SPINE through conversation with:         [x] Patient        []  Family    []  Friend(s)        Reason for Consult: Initial/Spiritual assessment, patient floor     Spiritual beliefs: (Please include comment if needed)     [x]  Identifies with a faith tradition:    Christian/Episcopal Tradition      []  Supported by a faith community:            []  Claims no spiritual orientation:           []  Seeking spiritual identity:                []  Adheres to an individual form of spirituality:           []  Not able to assess:                           Identified resources for coping:      []  Prayer                               []  Music                  []  Guided Imagery     [x]  Family/friends                 []  Pet visits     []  Devotional reading                         []  Unknown     []  Other:                                             Interventions offered during this visit: (See comments for more details)    Patient Interventions: Affirmation of emotions/emotional suffering, Affirmation of faith, Catharsis/review of pertinent events in supportive environment, Iconic (affirming the presence of God/Higher Power), Initial/Spiritual assessment, patient floor, Normalization of emotional/spiritual concerns, Prayer (assurance of)           Plan of Care:     []  Support spiritual and/or cultural needs    []  Support AMD and/or advance care planning process      []  Support grieving process   []  Coordinate Rites and/or Rituals    []  Coordination with community clergy   [x]  No spiritual needs identified at this time   []  Detailed Plan of Care below (See Comments)  []  Make referral to Music Therapy  []  Make referral to Pet Therapy      []  Make referral to Addiction services  []  Make referral to [place]  []  Make referral to Spiritual Care Partner  []  No future visits requested        []  Follow up visits as needed     Comments: Initial visit with patient who  was sitting up in bed and anxious to go home. Presence and care offered. 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  He shared how he was "without patience" and his desire to be discharged today; knowing that he still needs fluid drained from his lungs.  Care manager came in during visit and patient will be discharged today.  John Friedman shared of his 3 year old mother who lives in [place] who is dear to his heart.  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  He spoke some of his struggles.      Presence and care provided. Assured John Friedman of support and care as he returns home.  No further spiritual care needs noted as patient being discharged.  Pastoral care is available as needed/desired.  287-[id number].  Visit by: Rev. Chauncey Cruel, D.Min, MA, [facility]    Lead Chaplain Profession Development & Advancement  Visit by: Rev. Chauncey Cruel, D.Min, MA, [employer]    Lead Health and safety inspector & Advancement

## 2019-01-22 NOTE — Progress Notes (Signed)
Problem: Activity Intolerance  Goal: *Oxygen saturation during activity within specified parameters  Outcome: Progressing Towards Goal  Goal: *Able to remain out of bed as prescribed  Outcome: Progressing Towards Goal     Problem: Pressure Injury - Risk of  Goal: *Prevention of pressure injury  Description: Document Braden Scale and appropriate interventions in the flowsheet.  Outcome: Progressing Towards Goal  Note: Pressure Injury Interventions:            Activity Interventions: Increase time out of bed         Nutrition Interventions: Document food/fluid/supplement intake                     Problem: Patient Education: Go to Patient Education Activity  Goal: Patient/Family Education  Outcome: Progressing Towards Goal     Problem: Falls - Risk of  Goal: *Absence of Falls  Description: Document Patrcia Dolly Fall Risk and appropriate interventions in the flowsheet.  Outcome: Progressing Towards Goal  Note: Fall Risk Interventions:                                Problem: Patient Education: Go to Patient Education Activity  Goal: Patient/Family Education  Outcome: Progressing Towards Goal

## 2019-01-22 NOTE — Discharge Summary (Signed)
Discharge Summary       PATIENT ID: John Friedman  MRN: [id number]   DATE OF BIRTH: 03/18/1966    DATE OF ADMISSION: 01/21/2019  8:27 AM    DATE OF DISCHARGE: 01/23/2019  PRIMARY CARE PROVIDER: Antony Haste, MD     ATTENDING PHYSICIAN:  Olene Floss, DO   DISCHARGING PROVIDER: Olene Floss, DO    To contact this individual call [phone] and ask the operator to page.  If unavailable ask to be transferred the Adult Hospitalist Department.    CONSULTATIONS: IP CONSULT TO NEPHROLOGY    PROCEDURES/SURGERIES: * No surgery found *    ADMITTING DIAGNOSES & HOSPITAL COURSE:     53 year old male with past history renal failure on HD presented to [facility] with shortness of breath.  Found to have pulmonary edema and bilateral pleural effusions.  In the ED, hypoxic on ambulation.  Admitted for further evaluation.  Nephrology consulted  And patient underwent hemodialysis.  Interventional radiology consulted and bedside thoracentesis done with 1500 mL removed from right lung.  Patient instructed to follow-up outpatient for additional thoracentesis on the left side.  Patient felt much better and stable for discharge.    CTA chest:  IMPRESSION:  No evidence of acute pulmonary embolus. Moderately large bilateral pleural  effusions. Moderately severe pulmonary edema pattern. Bilateral nodular airspace  disease and bilateral consolidation may indicate superimposed pneumonia;  radiographic follow-up is recommended to assure resolution.        DISCHARGE DIAGNOSES / PLAN:      Acute hypoxic respiratory failure: resolved  Bilateral pleural effusions, R>L:  Volume overload with pulmonary edema:   (Pneumonia ruled out):  ARF on HD:  Anemia  Hyponatremia  Thrombocytopenia  Hx subclinical hypothyroidism: outpatient follow up  Chronic fatigue syndrome  Hx GERD    ??  ??     ADDITIONAL CARE RECOMMENDATIONS:   Please follow up for dialysis    Please follow up with your primary care physician. You will need a  referral to interventional radiology for left thoracentesis     Resume home medications    Return to the hospital for any new or worsening symptoms    PENDING TEST RESULTS:   At the time of discharge the following test results are still pending: pleural studies, echo    FOLLOW UP APPOINTMENTS:    Follow-up Information     Follow up With Specialties Details Why Contact Info    [facility] EMERGENCY [facility]  If symptoms worsen [facility] [place]    Antony Haste, MD Internal Medicine Schedule an appointment as soon as possible for a visit Office nurse will contact patient with follow up appointment information.  [address]  [address]  [place] [address]  [phone]      Leontine Locket, MD Nephrology Schedule an appointment as soon as possible for a visit  [address]  [address]  [place] [address]  [phone]               DISCHARGE MEDICATIONS:  Discharge Medication List as of 01/22/2019  4:59 PM      CONTINUE these medications which have NOT CHANGED    Details   clomiPRAMINE (ANAFRANIL) 50 mg capsule Take 50 mg by mouth nightly., Historical Med      tamsulosin (Flomax) 0.4 mg capsule Take 0.4 mg by mouth daily., Historical Med      OTHER Supplemental drink -  phosphatidylcholine with omega 3 and 6 and dextrose - to help with tremors., Historical Med      pregabalin (Lyrica) 300 mg capsule Take 300 mg by mouth nightly. For "sleep disturbances" - pt has had injuries from falling out of bed during sleep, Historical Med      armodafinil (NUVIGIL) 200 mg tab Take 1 Tab by mouth daily as needed., Historical Med      propranolol (INDERAL) 20 mg tablet Take 20 mg by mouth daily. For tremors, Historical Med      clonazepam (KLONOPIN) 1 mg tablet Take 1 mg by mouth two (2) times a day., Historical Med      liothyronine (CYTOMEL) 5 mcg tablet Take 5 mcg by mouth daily., Historical Med               NOTIFY YOUR PHYSICIAN FOR ANY OF THE FOLLOWING:    Fever over 101 degrees for 24 hours.   Chest pain, shortness of breath, fever, chills, nausea, vomiting, diarrhea, change in mentation, falling, weakness, bleeding. Severe pain or pain not relieved by medications.  Or, any other signs or symptoms that you may have questions about.    DISPOSITION:    Home With:   OT  PT  HH  RN       Long term SNF/Inpatient Rehab    Independent/assisted living    Hospice    Other:       PATIENT CONDITION AT DISCHARGE:     Functional status    Poor     Deconditioned    x Independent      Cognition    x Lucid     Forgetful     Dementia      Catheters/lines (plus indication)    Foley     PICC     PEG    x None      Code status    x Full code     DNR      PHYSICAL EXAMINATION AT DISCHARGE:  General:          Alert, cooperative, no distress, appears stated age.     Neck:               Supple, symmetrical  Lungs:             diminished breath sounds bilaterally, no increase work of breathing  Chest wall:      No tenderness  No Accessory muscle use.  Heart:              Regular  rhythm,  No  murmur   No edema  Abdomen:        Soft, non-tender. Not distended.  Bowel sounds normal  Psych:             anxious  Neurologic:      Alert, moves all extremities, answers questions appropriately and responds to commands       CHRONIC MEDICAL DIAGNOSES:  Problem List as of 01/22/2019 Date Reviewed: 02-09-2019          Codes Class Noted - Resolved    * (Principal) Hospital acquired PNA ICD-10-CM: J18.9, Y95  ICD-9-CM: 664  02/09/2019 - Present        ARF (acute renal failure) ([place]) ICD-10-CM: N17.9  ICD-9-CM: 403.9  Unknown - Present    Overview Signed 02-09-19  1:36 PM by Orlinda Blalock, NP     on HD  Hyponatremia ICD-10-CM: E87.1  ICD-9-CM: 276.1  01/21/2019 - Present        Thrombocytopenia ([place]) ICD-10-CM: D69.6  ICD-9-CM: 287.5  01/21/2019 - Present        Anemia ICD-10-CM: D64.9  ICD-9-CM: 285.9  01/21/2019 - Present        Pleural effusion, bilateral ICD-10-CM: J90   ICD-9-CM: 511.9  01/21/2019 - Present        Pulmonary edema ICD-10-CM: J81.1  ICD-9-CM: 366  01/21/2019 - Present        History of diastolic dysfunction YQI-34-VQ: Z86.79  ICD-9-CM: V12.59  01/21/2019 - Present    Overview Signed 01/21/2019  1:54 PM by Orlinda Blalock, NP     Chronic             Acute renal failure [facility]) ICD-10-CM: N17.9  ICD-9-CM: 584.9  01/20/2019 - Present    Overview Signed 01/20/2019  8:16 PM by Antony Haste, MD     May '20 - due to urinary retention, seen at [facility].  HD on M/W/F.             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  ICD-9-CM: 564.1  05/12/2015 - Present        Subclinical hypothyroidism ICD-10-CM: E03.9  ICD-9-CM: 244.8  Unknown - Present        Essential tremor ICD-10-CM: G25.0  ICD-9-CM: 333.1  Unknown - Present        GERD (gastroesophageal reflux disease) ICD-10-CM: K21.9  ICD-9-CM: 530.81  Unknown - Present    Overview Signed 09/26/2011  7:16 AM by Antony Haste, MD     Normal EGD (09/09/11)             H/O testicular cancer ICD-10-CM: Z85.47  ICD-9-CM: V10.47  Unknown - Present    Overview Signed 09/19/2011  8:54 AM by Antony Haste, MD     testicular cancer (Stage III)             Sleep disturbances ICD-10-CM: G47.9  ICD-9-CM: 780.50  Unknown - Present        Chronic fatigue ICD-10-CM: R53.82  ICD-9-CM: 780.79  08/15/1985 - Present        RESOLVED: Weakness of left upper extremity ICD-10-CM: R29.898  ICD-9-CM: 729.89  06/26/2013 - 06/26/2013        RESOLVED: Lumbar radiculopathy ICD-10-CM: M54.16  ICD-9-CM: 724.4  08/10/2012 - 01/01/2016    Overview Signed 08/10/2012  7:48 AM by Antony Haste, MD     S/p steroid injection by Dr Laverta Jobos 08/01/12                   Greater than 30 minutes were spent with the patient on counseling and coordination of care    Signed:   Olene Floss, DO  01/23/2019  5:56 AM

## 2019-01-22 NOTE — ACP (Advance Care Planning) (Signed)
Palliative Medicine Social Work      Dr. Marlaine Hind and I met with patient in his room. He was sitting up in bed, alert and oriented x4.    Patient has had ACP conversations previously. While he is not ready to complete the AMD form, he did verbalize some aspects of it.    Patient thinks he will appoint his mother (adoptive) John Friedman as MPOA1 and his biological mother John Friedman as MPOA. He met John Friedman when he was 53 and has kept up a relationship with her and his biological father John Friedman.    When discussing Living Will section, patient has stated multiple times that he "wants to fight." We talked about that being his mantra for how he wants to manage his health care now and also, knowing that one day we will all pass, how would he want to be cared for when doctors are saying there are no other treatment options, death is imminent? Patient said he has specific wishes that he would like to type out and it is something he would want to think about and work on on his own.    Patient thinks he would want to be an organ donor.    We discussed that without an AMD, LNOK guidelines in [place] (in his case as an unmarried man with no kids) indicate that adoptive mom John Friedman would be LNOK1 and sister John Friedman would be LNOK2.     Patient has blank AMD and instructed to provide copy of completed form to his physicians' offices.            Thank you for the opportunity to be involved in the care of John Friedman and his family.    Alvina Filbert, LMSW, Supervisee in Social Work  Palliative Medicine Social Worker  [phone]

## 2019-01-22 NOTE — Progress Notes (Signed)
Order received to discharge from [place].Marland Kitchen

## 2019-01-22 NOTE — Progress Notes (Signed)
Spiritual Care Assessment/Progress Note  [facility]      NAME: John Friedman      MRN: [id number]  AGE: 53 y.o. SEX: male  Religious Affiliation: Episcopal   Language: English     01/22/2019     Total Time (in minutes): 5     Spiritual Assessment begun in [facility] 5W1 ORTHO SPINE through conversation with:         [] Patient        []  Family    []  Friend(s)        Reason for Consult: Palliative Care, Initial/Spiritual Assessment     Spiritual beliefs: (Please include comment if needed)     []  Identifies with a faith tradition:         []  Supported by a faith community:            []  Claims no spiritual orientation:           []  Seeking spiritual identity:                []  Adheres to an individual form of spirituality:           [x]  Not able to assess: staff offering care                          Identified resources for coping:      []  Prayer                               []  Music                  []  Guided Imagery     []  Family/friends                 []  Pet visits     []  Devotional reading                         [x]  Unknown     []  Other                                              Interventions offered during this visit: (See comments for more details)    Patient Interventions: Initial visit           Plan of Care:     []  Support spiritual and/or cultural needs    []  Support AMD and/or advance care planning process      []  Support grieving process   []  Coordinate Rites and/or Rituals    []  Coordination with community clergy   []  No spiritual needs identified at this time   []  Detailed Plan of Care below (See Comments)  []  Make referral to Music Therapy  []  Make referral to Pet Therapy     []  Make referral to Addiction services  []  Make referral to [place]  []  Make referral to [place] [facility]  []  No future visits requested        [x]  Follow up visits as needed     Comments: Attempted visit with Mr. Woldt.  Pt was receiving care from staff. Chaplains will attempt to visit again as able.     Crystina Borrayo Sid Falcon, Palliative Chaplain

## 2019-01-22 NOTE — Progress Notes (Addendum)
1015- pt arrived on floor from [place]. Pt has planned procedure today. Pt provided gown and CHG wipes with instructions for use. MD now in with patient, V/S to be completed when MD finishes with patient.   1130- pt now having procedure done in room.  60- MD paged to see if pt can be discharged today.  1517- pt possible discharged today post chest x ray.

## 2019-01-22 NOTE — Telephone Encounter (Signed)
Gladys from Care Mgmt at [place]     Called to schedule a hospital f/u for "hospital acquired pneumonia".  Needs to be on Tues or Thurs since he has dialysis on M, W and Fr.  Would you want him coming into the office?

## 2019-01-22 NOTE — Progress Notes (Signed)
TOC   -RUR 20%  -Patient has discharge orders  -PT recommended HH, patient politely declined.   -Patient will transport himself.     CM noted discharge orders. PT recommend HH. Patient politely declined home health. Patient is from home. He is independent and on room air. PCP is Dr. Amedeo Plenty and he uses [facility] [address].     CM will follow     Gerarda Gunther, MHA/CRM

## 2019-01-22 NOTE — Progress Notes (Signed)
Nephrology Progress Note  John Friedman  Date of Admission : 01/21/2019    CC:  Follow up for AKI       Assessment and Plan     AKI:  - unclear cause, progressive CKD vs obstructive uropathy  - HD MWF at [facility] tomorrow AM if here    Volume overload:  - improving    Pleural effusions:  - for thoracentesis today    PNA:  - started levaquin  - SARS-CoV-2 neg 6/8    Anemia of CKD:  - ESA w/ HD while here    Obstructive uropathy:  - foley in place  - f/u w/ urology as an outpt         Interval History:  Seen and examined.  Feeling better. Anxious.  For thoracentesis today.  Long discussed re: dialysis needs, likelyhood of recovery, etc.  No reported cp or increase in SOB    Current Medications: all current  Medications have been eviewed in EPIC  Review of Systems: Pertinent items are noted in HPI.    Objective:  Vitals:    Vitals:    01/21/19 2340 01/22/19 0410 01/22/19 0705 01/22/19 1027   BP: 156/88 154/84 (!) 159/99 141/85   Pulse: 83 90 92 89   Resp: '18 16 18 18   '$ Temp: 97.6 ??F (36.4 ??C) 99.2 ??F (37.3 ??C) 98.8 ??F (37.1 ??C) 98.8 ??F (37.1 ??C)   SpO2: 93% 95% 93% 92%   Weight:  90 kg (198 lb 6.6 oz)       Intake and Output:  06/09 0701 - 06/09 1900  In: 360 [P.O.:360]  Out: -   06/07 1901 - 06/09 0700  In: -   Out: 2831 [Urine:550]    Physical Examination:  General: NAD,Conversant   Neck:  Supple, no mass  Resp:  Reduced bibasilar breath sounds  CV:  RRR,  no murmur or rub, mo LE edema  GI:  Soft, NT, + Bowel sounds, no hepatosplenomegaly  Neurologic:  Non focal  Psych:             AAO x 3 appropriate affect \\  Skin:  No Rash  Access:           RIJ PC c/d/i  GU:                  Foley in place    '[]'$     High complexity decision making was performed  '[]'$     Patient is at high-risk of decompensation with multiple organ involvement    Lab Data Personally Reviewed: I have reviewed all the pertinent labs, microbiology data and radiology studies during assessment.    Recent Labs     01/22/19  0422 01/21/19   1802 01/21/19  0834   NA 136   136  --  131*   K 3.7   3.7  --  3.9   CL 99   100  --  93*   CO2 25   25  --  25   GLU 74   77  --  84   BUN 30*   29*  --  48*   CREA 6.53*   6.56*  --  9.46*   CA 7.8*   7.7*  --  7.5*   MG 2.2  --   --    PHOS 4.8*   4.9*  --   --    ALB 3.1*   3.1*  --  3.3*   ALT 24  --  25   INR 1.2* 1.1 1.1     Recent Labs     01/22/19  0422 01/21/19  0834   WBC 4.4 4.8   HGB 7.6* 7.6*   HCT 22.8* 23.6*   PLT 130* 129*     No results found for: SDES  Lab Results   Component Value Date/Time    Culture result: NO GROWTH AFTER 17 HOURS 01/21/2019 10:53 AM     Recent Results (from the past 24 hour(s))   SARS-COV-2    Collection Time: 01/21/19  2:00 PM   Result Value Ref Range    Specimen source Nasopharyngeal      SARS-CoV-2 Not detected NOTD     PROTHROMBIN TIME + INR    Collection Time: 01/21/19  6:02 PM   Result Value Ref Range    INR 1.1 0.9 - 1.1      Prothrombin time 10.9 9.0 - 11.1 sec   PTT    Collection Time: 01/21/19  6:02 PM   Result Value Ref Range    aPTT 31.8 22.1 - 32.0 sec    aPTT, therapeutic range     58.0 - 77.0 SECS   FIBRINOGEN    Collection Time: 01/21/19  6:02 PM   Result Value Ref Range    Fibrinogen 394 200 - 475 mg/dL   D DIMER    Collection Time: 01/21/19  6:02 PM   Result Value Ref Range    D-dimer 2.25 (H) 0.00 - 0.65 mg/L FEU   RENAL FUNCTION PANEL    Collection Time: 01/22/19  4:22 AM   Result Value Ref Range    Sodium 136 136 - 145 mmol/L    Potassium 3.7 3.5 - 5.1 mmol/L    Chloride 100 97 - 108 mmol/L    CO2 25 21 - 32 mmol/L    Anion gap 11 5 - 15 mmol/L    Glucose 77 65 - 100 mg/dL    BUN 29 (H) 6 - 20 MG/DL    Creatinine 6.56 (H) 0.70 - 1.30 MG/DL    BUN/Creatinine ratio 4 (L) 12 - 20      GFR est AA 11 (L) >60 ml/min/1.24m    GFR est non-AA 9 (L) >60 ml/min/1.714m   Calcium 7.7 (L) 8.5 - 10.1 MG/DL    Phosphorus 4.9 (H) 2.6 - 4.7 MG/DL    Albumin 3.1 (L) 3.5 - 5.0 g/dL   IRON PROFILE    Collection Time: 01/22/19  4:22 AM   Result Value Ref Range     Iron 50 35 - 150 ug/dL    TIBC 253 250 - 450 ug/dL    Iron % saturation 20 20 - 50 %   METABOLIC PANEL, COMPREHENSIVE    Collection Time: 01/22/19  4:22 AM   Result Value Ref Range    Sodium 136 136 - 145 mmol/L    Potassium 3.7 3.5 - 5.1 mmol/L    Chloride 99 97 - 108 mmol/L    CO2 25 21 - 32 mmol/L    Anion gap 12 5 - 15 mmol/L    Glucose 74 65 - 100 mg/dL    BUN 30 (H) 6 - 20 MG/DL    Creatinine 6.53 (H) 0.70 - 1.30 MG/DL    BUN/Creatinine ratio 5 (L) 12 - 20      GFR est AA 11 (L) >60 ml/min/1.7390m  GFR est non-AA 9 (L) >60 ml/min/1.15m24m  Calcium 7.8 (L) 8.5 - 10.1 MG/DL    Bilirubin, total 0.4 0.2 - 1.0 MG/DL    ALT (SGPT) 24 12 - 78 U/L    AST (SGOT) 19 15 - 37 U/L    Alk. phosphatase 81 45 - 117 U/L    Protein, total 5.8 (L) 6.4 - 8.2 g/dL    Albumin 3.1 (L) 3.5 - 5.0 g/dL    Globulin 2.7 2.0 - 4.0 g/dL    A-G Ratio 1.1 1.1 - 2.2     CBC WITH AUTOMATED DIFF    Collection Time: 01/22/19  4:22 AM   Result Value Ref Range    WBC 4.4 4.1 - 11.1 K/uL    RBC 2.45 (L) 4.10 - 5.70 M/uL    HGB 7.6 (L) 12.1 - 17.0 g/dL    HCT 22.8 (L) 36.6 - 50.3 %    MCV 93.1 80.0 - 99.0 FL    MCH 31.0 26.0 - 34.0 PG    MCHC 33.3 30.0 - 36.5 g/dL    RDW 12.4 11.5 - 14.5 %    PLATELET 130 (L) 150 - 400 K/uL    MPV 11.3 8.9 - 12.9 FL    NRBC 0.0 0 PER 100 WBC    ABSOLUTE NRBC 0.00 0.00 - 0.01 K/uL    NEUTROPHILS 69 32 - 75 %    LYMPHOCYTES 12 12 - 49 %    MONOCYTES 13 5 - 13 %    EOSINOPHILS 4 0 - 7 %    BASOPHILS 1 0 - 1 %    IMMATURE GRANULOCYTES 1 (H) 0.0 - 0.5 %    ABS. NEUTROPHILS 3.1 1.8 - 8.0 K/UL    ABS. LYMPHOCYTES 0.5 (L) 0.8 - 3.5 K/UL    ABS. MONOCYTES 0.6 0.0 - 1.0 K/UL    ABS. EOSINOPHILS 0.2 0.0 - 0.4 K/UL    ABS. BASOPHILS 0.0 0.0 - 0.1 K/UL    ABS. IMM. GRANS. 0.0 0.00 - 0.04 K/UL    DF SMEAR SCANNED      PLATELET COMMENTS Large Platelets      RBC COMMENTS NORMOCYTIC, NORMOCHROMIC     PROTHROMBIN TIME + INR    Collection Time: 01/22/19  4:22 AM   Result Value Ref Range    INR 1.2 (H) 0.9 - 1.1       Prothrombin time 11.8 (H) 9.0 - 11.1 sec   PTT    Collection Time: 01/22/19  4:22 AM   Result Value Ref Range    aPTT 31.6 22.1 - 32.0 sec    aPTT, therapeutic range     58.0 - 77.0 SECS   FIBRINOGEN    Collection Time: 01/22/19  4:22 AM   Result Value Ref Range    Fibrinogen 351 200 - 475 mg/dL   D DIMER    Collection Time: 01/22/19  4:22 AM   Result Value Ref Range    D-dimer 3.14 (H) 0.00 - 0.65 mg/L FEU   MAGNESIUM    Collection Time: 01/22/19  4:22 AM   Result Value Ref Range    Magnesium 2.2 1.6 - 2.4 mg/dL   PHOSPHORUS    Collection Time: 01/22/19  4:22 AM   Result Value Ref Range    Phosphorus 4.8 (H) 2.6 - 4.7 MG/DL                   Willette Cluster, MD  [place]   [address], [place],  [place] [address]  Phone - [phone]   Fax - [phone]  [email or web address]

## 2019-01-22 NOTE — Telephone Encounter (Signed)
Currently still admitted.  Need to talk to Manuela Schwartz about the [facility] vs [facility] plan.    Tell him will plan on next Tuesday, but need to figure out logistics and may be video w/ me or office at [facility].

## 2019-01-22 NOTE — Progress Notes (Signed)
PHYSICAL THERAPY EVALUATION/DISCHARGE  Patient: John Friedman (53 y.o. male)  Date: 01/22/2019  Primary Diagnosis: Hospital acquired PNA [J18.9, Y95]        Precautions:          ASSESSMENT  Based on the objective data described below, the patient is presently independent for transfers and modified independent for ambulation for increased time. Upon arrival, pt received report for transfer to a different floor. Pt ascended from EOB and standing with wide BOS and no AD. Pt ambulated around room negotiating narrow environment, reaching for belongings, and carrying objects. Pt did not demonstrate any episodes of LOB or buckling of knees noted. Pt then transferred to wheelchair. Pt states no concerns for mobility at home.     Functional Outcome Measure:  The patient scored 95/100 on the Barthel Index outcome measure which is indicative of patient is 5% dependent on others.      Other factors to consider for discharge: no stairs     Further skilled acute physical therapy is not indicated at this time.     PLAN :  Recommendation for discharge: (in order for the patient to meet his/her long term goals)  Physical therapy at least 2 days/week in the home     This discharge recommendation:  Has been made in collaboration with the attending provider and/or case management    IF patient discharges home will need the following DME: none       SUBJECTIVE:   Patient stated ???thank you for coming by.???    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    Lumbar radiculopathy 08/10/2012    S/p steroid injection by Dr Laverta [place] 08/01/12     Normal cardiac stress test 09/05/11    Pleural effusion, bilateral 01/21/2019    Pulmonary edema 01/21/2019    Sleep disturbances     acting out in his sleep    Thrombocytopenia ([place]) 01/21/2019     Past Surgical History:   Procedure Laterality Date    HX CHOLECYSTECTOMY  12/05/2011    abnormal HIDA scan (EF < 2%)    HX COLONOSCOPY  07/12/10    hyperplastic polyp, repeat 10 yrs    HX COLONOSCOPY  02/23/15    normal    HX CYST REMOVAL Right 01/06/2016    3rd toe ganglion cyst removal - podiatry (Dr Raliegh Ip)    HX GI  09/09/11    EGD- normal    HX HEENT Left 06/2012    tonsilar bx: benign tissue (Dr Buddy Duty)    HX ORCHIECTOMY Left 2002    HX ORTHOPAEDIC      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36    71 SHOULDER ARTHROSCOPY Right 08/13/2018       Prior level of function: independent, driving   Personal factors and/or comorbidities impacting plan of care: PMH  Home Situation  Home Environment: Private residence  # Steps to Enter: 0  One/Two Story Residence: One story  Living Alone: Yes  Patient Expects to be Discharged to:: Private residence  Current DME Used/Available at Home: None    EXAMINATION/PRESENTATION/DECISION MAKING:   Critical Behavior:  Neurologic State: Alert  Orientation Level: Oriented X4  Cognition: Appropriate decision making, Appropriate for age attention/concentration, Appropriate safety awareness, Follows commands     Hearing:  Auditory  Auditory Impairment: None  Skin:  intact  Edema: none noted  Range Of Motion:  AROM: Within functional limits           PROM: Within functional limits           Strength:    Strength: Within functional limits    Functional Mobility:  Bed Mobility:     Supine to Sit: Independent     Scooting: Independent  Transfers:  Sit to Stand: Independent  Stand to Sit: Independent                       Balance:   Sitting: Intact  Standing: Intact;Without support   Ambulation/Gait Training:  Distance (ft): 30 Feet (ft)  Assistive Device: Gait belt  Ambulation - Level of Assistance: Modified independent                 Base of Support: Narrowed         Functional Measure:  Barthel Index:    Bathing: 5  Bladder: 10  Bowels: 10  Grooming: 5  Dressing: 10  Feeding: 10  Mobility: 15  Stairs: 5  Toilet Use: 10  Transfer (Bed to Chair and Back): 15  Total: 95/100       The Barthel ADL Index: Guidelines  1. The index should be used as a record of what a patient does, not as a record of what a patient could do.  2. The main aim is to establish degree of independence from any help, physical or verbal, however minor and for whatever reason.  3. The need for supervision renders the patient not independent.  4. A patient's performance should be established using the best available evidence. Asking the patient, friends/relatives and nurses are the usual sources, but direct observation and common sense are also important. However direct testing is not needed.  5. Usually the patient's performance over the preceding 24-48 hours is important, but occasionally longer periods will be relevant.  6. Middle categories imply that the patient supplies over 50 per cent of the effort.  7. Use of aids to be independent is allowed.    Daneen Schick., Barthel, D.W. [phone]). Functional evaluation: the Barthel Index. [place] (14)2.  Lucianne Lei der [place], J.J.M.F, [place], Diona Browner., Oris Drone., [place], [place] (1999). Measuring the change indisability after inpatient rehabilitation; comparison of the responsiveness of the Barthel Index and Functional Independence Measure. Journal of Neurology, Neurosurgery, and Psychiatry, 66(4), [phone].  Wilford Sports, N.J.A, [place],  W.J.M, & Koopmanschap, M.A. (2004.) Assessment of post-stroke quality of life in cost-effectiveness studies: The usefulness of the Barthel Index and the EuroQoL-5D. Quality of Life Research, 37, 427-43             Physical Therapy Evaluation Charge Determination   History Examination Presentation Decision-Making   MEDIUM  Complexity : 1-2 comorbidities / personal factors will impact the outcome/ POC  LOW Complexity : 1-2 Standardized tests and measures addressing body structure, function, activity limitation and / or participation in  recreation  LOW Complexity : Stable, uncomplicated  LOW Complexity : FOTO score of 75-100      Based on the above components, the patient evaluation is determined to be of the following complexity level: LOW     Activity Tolerance:   Good  Please refer to the flowsheet for vital signs taken during this treatment.    After treatment patient left in no apparent distress:   with RN to transport in w/c     COMMUNICATION/EDUCATION:   The patient???s plan of care was discussed with: Registered nurse and Physician.     Fall prevention education was provided and the patient/caregiver indicated understanding. and Patient/family have participated as able in goal setting and plan of care.    Thank you for this referral.  Domingo Cocking, PT, DPT   Time Calculation: 13 mins

## 2019-01-22 NOTE — Progress Notes (Signed)
PHYSICAL THERAPY EVALUATION/DISCHARGE  Patient: John Friedman (53 y.o. male)  Date: 01/22/2019  Primary Diagnosis: Hospital acquired PNA [J18.9, Y95]        Precautions:          ASSESSMENT  Based on the objective data described below, the patient is presently independent for transfers and modified independent for ambulation for increased time. Upon arrival, pt received report for transfer to a different floor. Pt ascended from EOB and standing with wide BOS and no AD. Pt ambulated around room negotiating narrow environment, reaching for belongings, and carrying objects. Pt did not demonstrate any episodes of LOB or buckling of knees noted. Pt then transferred to wheelchair. Pt states no concerns for mobility at home.     Functional Outcome Measure:  The patient scored 95/100 on the Barthel Index outcome measure which is indicative of patient is 5% dependent on others.      Other factors to consider for discharge: no stairs     Further skilled acute physical therapy is not indicated at this time.     PLAN :  Recommendation for discharge: (in order for the patient to meet his/her long term goals)  Physical therapy at least 2 days/week in the home     This discharge recommendation:  Has been made in collaboration with the attending provider and/or case management    IF patient discharges home will need the following DME: none       SUBJECTIVE:   Patient stated "thank you for coming by."    OBJECTIVE DATA SUMMARY:   HISTORY:    Past Medical History:   Diagnosis Date   . Anemia 01/21/2019   . ARF (acute renal failure) (HCC)     on HD   . BPH (benign prostatic hyperplasia)    . Cancer (HCC)     left testicle- stage III, nonseminomatous germ cell tumor, s/p orchiectomy & BEP x 3 cycles   . Chronic fatigue January 1987   . Essential tremor    . GERD (gastroesophageal reflux disease)    . 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 Hyponatremia 01/21/2019   . Hypothyroid    . Irritable bowel syndrome with  diarrhea 05/12/2015   . Lumbar radiculopathy 08/10/2012    S/p steroid injection by Dr Jacqulyn Bath 08/01/12    . Normal cardiac stress test 09/05/11   . Pleural effusion, bilateral 01/21/2019   . Pulmonary edema 01/21/2019   . Sleep disturbances     acting out in his sleep   . Thrombocytopenia (HCC) 01/21/2019     Past Surgical History:   Procedure Laterality Date   . HX CHOLECYSTECTOMY  12/05/2011    abnormal HIDA scan (EF < 2%)   . HX COLONOSCOPY  07/12/10    hyperplastic polyp, repeat 10 yrs   . HX COLONOSCOPY  02/23/15    normal   . HX CYST REMOVAL Right 01/06/2016    3rd toe ganglion cyst removal - podiatry (Dr Kirtland Bouchard)   . HX GI  09/09/11    EGD- normal   . HX HEENT Left 06/2012    tonsilar bx: benign tissue (Dr Sharl Ma)   . HX ORCHIECTOMY Left 2002   . HX ORTHOPAEDIC      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12   . HX SHOULDER ARTHROSCOPY Right 08/13/2018       Prior level of function: independent, driving   Personal factors and/or comorbidities impacting plan of care: PMH  Home Situation  Home Environment: Private residence  # Steps to Enter: 0  One/Two Story Residence: One story  Living Alone: Yes  Patient Expects to be Discharged to:: Private residence  Current DME Used/Available at Home: None    EXAMINATION/PRESENTATION/DECISION MAKING:   Critical Behavior:  Neurologic State: Alert  Orientation Level: Oriented X4  Cognition: Appropriate decision making, Appropriate for age attention/concentration, Appropriate safety awareness, Follows commands     Hearing:  Auditory  Auditory Impairment: None  Skin:  intact  Edema: none noted  Range Of Motion:  AROM: Within functional limits           PROM: Within functional limits           Strength:    Strength: Within functional limits    Functional Mobility:  Bed Mobility:     Supine to Sit: Independent     Scooting: Independent  Transfers:  Sit to Stand: Independent  Stand to Sit: Independent                       Balance:   Sitting: Intact  Standing: Intact;Without support  Ambulation/Gait  Training:  Distance (ft): 30 Feet (ft)  Assistive Device: Gait belt  Ambulation - Level of Assistance: Modified independent                 Base of Support: Narrowed         Functional Measure:  Barthel Index:    Bathing: 5  Bladder: 10  Bowels: 10  Grooming: 5  Dressing: 10  Feeding: 10  Mobility: 15  Stairs: 5  Toilet Use: 10  Transfer (Bed to Chair and Back): 15  Total: 95/100       The Barthel ADL Index: Guidelines  1. The index should be used as a record of what a patient does, not as a record of what a patient could do.  2. The main aim is to establish degree of independence from any help, physical or verbal, however minor and for whatever reason.  3. The need for supervision renders the patient not independent.  4. A patient's performance should be established using the best available evidence. Asking the patient, friends/relatives and nurses are the usual sources, but direct observation and common sense are also important. However direct testing is not needed.  5. Usually the patient's performance over the preceding 24-48 hours is important, but occasionally longer periods will be relevant.  6. Middle categories imply that the patient supplies over 50 per cent of the effort.  7. Use of aids to be independent is allowed.    Clarisa Kindred., Barthel, D.W. [phone]). Functional evaluation: the Barthel Index. [place] Med [place] (14)2.  Zenaida Niece der [place], J.J.M.F, [place], Ian Malkin., Margret Chance., [place], [place]. (1999). Measuring the change indisability after inpatient rehabilitation; comparison of the responsiveness of the Barthel Index and Functional Independence Measure. Journal of Neurology, Neurosurgery, and Psychiatry, 66(4), [phone].  Dawson Bills, N.J.A, [place],  W.J.M, & Koopmanschap, M.A. (2004.) Assessment of post-stroke quality of life in cost-effectiveness studies: The usefulness of the Barthel Index and the EuroQoL-5D. Quality of Life Research, 43, 829-56            Physical Therapy Evaluation Charge  Determination   History Examination Presentation Decision-Making   MEDIUM  Complexity : 1-2 comorbidities / personal factors will impact the outcome/ POC  LOW Complexity : 1-2 Standardized tests and measures addressing body structure, function, activity limitation and / or participation in  recreation  LOW Complexity : Stable, uncomplicated  LOW Complexity : FOTO score of 75-100      Based on the above components, the patient evaluation is determined to be of the following complexity level: LOW     Activity Tolerance:   Good  Please refer to the flowsheet for vital signs taken during this treatment.    After treatment patient left in no apparent distress:   with RN to transport in w/c     COMMUNICATION/EDUCATION:   The patient's plan of care was discussed with: Registered nurse and Physician.     Fall prevention education was provided and the patient/caregiver indicated understanding. and Patient/family have participated as able in goal setting and plan of care.    Thank you for this referral.  Alfonse Alpers, PT, DPT   Time Calculation: 13 mins

## 2019-01-22 NOTE — Progress Notes (Signed)
Problem: Activity Intolerance  Goal: *Oxygen saturation during activity within specified parameters  Outcome: Progressing Towards Goal  Goal: *Able to remain out of bed as prescribed  Outcome: Progressing Towards Goal     Problem: Pressure Injury - Risk of  Goal: *Prevention of pressure injury  Description: Document Braden Scale and appropriate interventions in the flowsheet.  Outcome: Progressing Towards Goal  Note: Pressure Injury Interventions:            Activity Interventions: Increase time out of bed         Nutrition Interventions: Document food/fluid/supplement intake                     Problem: Patient Education: Go to Patient Education Activity  Goal: Patient/Family Education  Outcome: Progressing Towards Goal     Problem: Falls - Risk of  Goal: *Absence of Falls  Description: Document Bridgette Habermann Fall Risk and appropriate interventions in the flowsheet.  Outcome: Progressing Towards Goal  Note: Fall Risk Interventions:                                Problem: Patient Education: Go to Patient Education Activity  Goal: Patient/Family Education  Outcome: Progressing Towards Goal

## 2019-01-23 ENCOUNTER — Telehealth

## 2019-01-23 LAB — LEGIONELLA PNEUMOPHILA AG, URINE: L pneumophila S1 Ag, urine: NEGATIVE

## 2019-01-23 LAB — LEGIONELLA ANTIGEN, URINE: L. pneumophila Serogp 1 Ur Ag: NEGATIVE

## 2019-01-23 NOTE — Telephone Encounter (Signed)
RC to patient, identified with 2 identifiers.  Advised Dr. Amedeo Plenty is still reviewing notes and that he is aware that he is asking about having the thoracentesis done. Told him he could not make a determination regarding this until notes are reviewed, that Dr. Amedeo Plenty would be in touch with Amy tomorrow to provide recommendations.  Travel screen done.  Reports some SOB, Dr. Amedeo Plenty aware, he is Covid negative, okay to come into office.  Appointment scheduled for Tuesday, 01/29/19 at 11am for hospital follow up.

## 2019-01-23 NOTE — Telephone Encounter (Signed)
Patient called to see if Dr. Amedeo Plenty had ordered the test for his lung.  States he has fluid on his lung and the sooner he can have this done, he will feel better.

## 2019-01-23 NOTE — Telephone Encounter (Signed)
Okay to see in office despite SOB, per Dr. Amedeo Plenty, [place] negative.

## 2019-01-23 NOTE — Telephone Encounter (Signed)
01/23/19 1:21 PM--contacted patient who is at work right now and unavailable to talk.  He is willing to have a conversation tomorrow and we have made plans for me to call him around 3PM.

## 2019-01-23 NOTE — Telephone Encounter (Signed)
Patient needs fluid drained off his lungs and would like to speak with Dr. Amedeo Plenty or his nursed today.

## 2019-01-23 NOTE — Telephone Encounter (Signed)
-----   Message from Vergie Living sent at 01/23/2019  7:41 AM EDT -----  Regarding: Dr. Jacqulyn Liner  General Message/Vendor Calls    Caller's first and last name:Montavious Avon Gully      Reason for call: referal to [place] required yes/no and why:yes      Best contact number(s):[phone]      Details to clarify the request:Patient needs a referrall to [facility] for Interventional Radiology Thoracenteseis for his left lung fluid on his lung. Also he needs to set up a hospital follow up appointment.      Vergie Living

## 2019-01-23 NOTE — Telephone Encounter (Signed)
01/23/19 1:21 PM--contacted patient who is at work right now and unavailable to talk.  He is willing to have a conversation tomorrow and we have made plans for me to call him around 3PM.    01/24/19 4:51 PM    Patient contacted regarding COVID-19  risk. Discussed COVID-19 related testing which was available at this time. Test results were negative. Patient informed of results, if available? N/A     Care Transition Nurse/ Ambulatory Care Manager contacted the patient by telephone to perform post discharge assessment. Verified name and DOB with patient as identifiers. Provided introduction to self, and explanation of the CTN/ACM role, and reason for call due to risk factors for infection and/or exposure to COVID-19.     Symptoms reviewed with patient who verbalized the following symptoms: fatigue, cough, shortness of breath, diarrhea, dizziness/lightheadedness, no new symptoms and no worsening symptoms.      Due to no new or worsening symptoms encounter was not routed to provider for escalation. Discussed follow-up appointments. If no appointment was previously scheduled, appointment scheduling offered: N/A has appt already with PCP  [facility] follow up appointment(s):   Future Appointments   Date Time Provider [place]   01/29/2019 11:00 AM Antony Haste, MD [place]     [place] follow up appointment(s): On 6/18 with Dr. Garnette Czech      Patient has following risk factors of: pneumonia. CTN/ACM reviewed discharge instructions, medical action plan and red flags such as increased shortness of breath, increasing fever and signs of decompensation with patient who verbalized understanding.   Discussed exposure protocols and quarantine with CDC Guidelines ???What to do if you are sick with coronavirus disease 2019.??? Patient was given an opportunity for questions and concerns. The patient agrees to contact the [facility] exposure line [phone], local health department [place] [facility]  [phone]) and PCP office for questions related to their healthcare. CTN/ACM provided contact information for future needs.    Did not review and educate patient on any new and changed medications related to discharge diagnosis; he was not prescribed any new meds at D/C and feels very comfortable with his PTA meds, per pt.  No referral to pharmacy team placed for D/C med review.    Updated health care decision maker information with patient and discussed ACP.  He states he was visited in hospital by staff discussing ACPs and they left him with a blank form.  He is interested in completing and plans to do so soon.    Encouraged pt to activate MyChart and with his permission, sent email link to him and gave him help desk # in case of any issues activating it.  Reviewed with him the benefits of an active pt portal acct.  Advised I will send our COVID-19 information and resource phone #s to him via the portal once it's active.    Patient/family/caregiver given information for [employer] and agrees to enroll yes  Patient's preferred e-mail:  [email or web address]  Patient's preferred phone number: [phone]  Based on Loop alert triggers, patient will be contacted by nurse care manager for worsening symptoms.    Pt will be further monitored by COVID Loop Team?? based on severity of symptoms and risk factors.

## 2019-01-23 NOTE — Telephone Encounter (Signed)
Thoracentesis ordered, L sided.    Future Appointments   Date Time Provider [place]   01/29/2019 11:00 AM Antony Haste, MD [place]

## 2019-01-23 NOTE — Telephone Encounter (Signed)
See telephone encounther 01/23/19.

## 2019-01-24 ENCOUNTER — Inpatient Hospital Stay
Admit: 2019-01-24 | Payer: PRIVATE HEALTH INSURANCE | Attending: Vascular & Interventional Radiology | Primary: Internal Medicine

## 2019-01-24 ENCOUNTER — Inpatient Hospital Stay: Admit: 2019-01-24 | Payer: PRIVATE HEALTH INSURANCE | Attending: Diagnostic Radiology | Primary: Internal Medicine

## 2019-01-24 DIAGNOSIS — J90 Pleural effusion, not elsewhere classified: Secondary | ICD-10-CM

## 2019-01-24 NOTE — Progress Notes (Signed)
Patient arrives ambulatory for left sided thoracentesis. Consent reviewed, 1050 amber fluid removed, patient tolerated procedure well. Post CXR neg, all questions answered, ok for patient to be discharged.

## 2019-01-24 NOTE — Telephone Encounter (Signed)
Verified patient identity with two identifiers. Spoke with patient by phone.  Pt instructed to go to outpatient registration as soon as possible for procedure.  Patient verbalized understanding.  Our office is obtaining auth  No fluid to be tested per [facility] this is just a therapeutic drain.

## 2019-01-24 NOTE — Telephone Encounter (Signed)
-----   Message from Vergie Living sent at 01/24/2019  7:38 AM EDT -----  Regarding: Dr. Jacqulyn Liner  General Message/Vendor Calls    Caller's first and last name: Delman Cheadle      Reason for call: needs a referall      Callback required yes/no and why:yes      Best contact number(s):[phone]      Details to clarify the request: please call him back today      Vergie Living

## 2019-01-24 NOTE — Telephone Encounter (Signed)
Verified patient identity with two identifiers. Spoke with patient by phone.  Pt feeling poorly not sleeping well, reports SOB, not in distress asking about lung being drained.    US guided thoracentesis ordered by [facility], will call scheduler to get this set up.  Pt states dialysis is M/W/F so his schedule is free today.

## 2019-01-24 NOTE — Telephone Encounter (Signed)
Call to scheduling, procedure requires pt have driver home, patient does not have driver wants to use [facility] Friedman with radiology is checking with the radiologist to get approval and once the scheduler hears back she will call us back. Pt aware we are waiting to hear back from radiology.

## 2019-01-25 ENCOUNTER — Observation Stay: Admit: 2019-01-25 | Payer: PRIVATE HEALTH INSURANCE | Primary: Internal Medicine

## 2019-01-25 ENCOUNTER — Inpatient Hospital Stay
Admit: 2019-01-25 | Discharge: 2019-01-25 | Disposition: A | Discharge status: Home / Self Care | Payer: PRIVATE HEALTH INSURANCE | Attending: Emergency Medicine

## 2019-01-25 ENCOUNTER — Emergency Department: Admit: 2019-01-25 | Payer: PRIVATE HEALTH INSURANCE | Primary: Internal Medicine

## 2019-01-25 DIAGNOSIS — E8770 Fluid overload, unspecified: Secondary | ICD-10-CM

## 2019-01-25 LAB — RESPIRATORY PANEL,PCR,NASOPHARYNGEAL
Adenovirus: NOT DETECTED
B. parapertussis, PCR: NOT DETECTED
Bordetella pertussis - PCR: NOT DETECTED
Chlamydophila pneumoniae DNA, QL, PCR: NOT DETECTED
Coronavirus 229E: NOT DETECTED
Coronavirus CVNL63: NOT DETECTED
Coronavirus HKU1: NOT DETECTED
Coronavirus OC43: NOT DETECTED
INFLUENZA A H1N1 PCR: NOT DETECTED
Influenza A, subtype H1: NOT DETECTED
Influenza A, subtype H3: NOT DETECTED
Influenza A: NOT DETECTED
Influenza B: NOT DETECTED
Metapneumovirus: NOT DETECTED
Mycoplasma pneumoniae DNA, QL, PCR: NOT DETECTED
Parainfluenza 1: NOT DETECTED
Parainfluenza 2: NOT DETECTED
Parainfluenza 3: NOT DETECTED
Parainfluenza virus 4: NOT DETECTED
RSV by PCR: NOT DETECTED
Rhinovirus and Enterovirus: NOT DETECTED

## 2019-01-25 LAB — FIBRINOGEN
Fibrinogen: 407 mg/dL (ref 200–475)
Fibrinogen: 407 mg/dL (ref 200–475)

## 2019-01-25 LAB — METABOLIC PANEL, COMPREHENSIVE
A-G Ratio: 1.1 (ref 1.1–2.2)
A-G Ratio: 1.3 (ref 1.1–2.2)
ALT (SGPT): 32 U/L (ref 12–78)
ALT (SGPT): 26 U/L (ref 12–78)
AST (SGOT): 23 U/L (ref 15–37)
AST (SGOT): 20 U/L (ref 15–37)
Albumin: 3.5 g/dL (ref 3.5–5.0)
Albumin: 3 g/dL — ABNORMAL LOW (ref 3.5–5.0)
Alk. phosphatase: 87 U/L (ref 45–117)
Alk. phosphatase: 74 U/L (ref 45–117)
Anion gap: 12 mmol/L (ref 5–15)
Anion gap: 13 mmol/L (ref 5–15)
BUN/Creatinine ratio: 4 — ABNORMAL LOW (ref 12–20)
BUN/Creatinine ratio: 4 — ABNORMAL LOW (ref 12–20)
BUN: 31 MG/DL — ABNORMAL HIGH (ref 6–20)
BUN: 33 MG/DL — ABNORMAL HIGH (ref 6–20)
Bilirubin, total: 0.6 MG/DL (ref 0.2–1.0)
Bilirubin, total: 0.4 MG/DL (ref 0.2–1.0)
CO2: 25 mmol/L (ref 21–32)
CO2: 25 mmol/L (ref 21–32)
Calcium: 7.8 MG/DL — ABNORMAL LOW (ref 8.5–10.1)
Calcium: 7.1 MG/DL — ABNORMAL LOW (ref 8.5–10.1)
Chloride: 96 mmol/L — ABNORMAL LOW (ref 97–108)
Chloride: 95 mmol/L — ABNORMAL LOW (ref 97–108)
Creatinine: 7.7 MG/DL — ABNORMAL HIGH (ref 0.70–1.30)
Creatinine: 8.49 MG/DL — ABNORMAL HIGH (ref 0.70–1.30)
GFR est AA: 9 mL/min/{1.73_m2} — ABNORMAL LOW (ref >60)
GFR est AA: 8 mL/min/{1.73_m2} — ABNORMAL LOW (ref >60)
GFR est non-AA: 7 mL/min/{1.73_m2} — ABNORMAL LOW (ref >60)
GFR est non-AA: 7 mL/min/{1.73_m2} — ABNORMAL LOW (ref >60)
Globulin: 3.1 g/dL (ref 2.0–4.0)
Globulin: 2.3 g/dL (ref 2.0–4.0)
Glucose: 92 mg/dL (ref 65–100)
Glucose: 76 mg/dL (ref 65–100)
Potassium: 3.8 mmol/L (ref 3.5–5.1)
Potassium: 3.8 mmol/L (ref 3.5–5.1)
Protein, total: 6.6 g/dL (ref 6.4–8.2)
Protein, total: 5.3 g/dL — ABNORMAL LOW (ref 6.4–8.2)
Sodium: 133 mmol/L — ABNORMAL LOW (ref 136–145)
Sodium: 133 mmol/L — ABNORMAL LOW (ref 136–145)

## 2019-01-25 LAB — CBC WITH AUTOMATED DIFF
ABS. BASOPHILS: 0 10*3/uL (ref 0.0–0.1)
ABS. BASOPHILS: 0 10*3/uL (ref 0.0–0.1)
ABS. EOSINOPHILS: 0.1 10*3/uL (ref 0.0–0.4)
ABS. EOSINOPHILS: 0 10*3/uL (ref 0.0–0.4)
ABS. IMM. GRANS.: 0.1 10*3/uL — ABNORMAL HIGH (ref 0.00–0.04)
ABS. IMM. GRANS.: 0 10*3/uL (ref 0.00–0.04)
ABS. LYMPHOCYTES: 0.5 10*3/uL — ABNORMAL LOW (ref 0.8–3.5)
ABS. LYMPHOCYTES: 0.3 10*3/uL — ABNORMAL LOW (ref 0.8–3.5)
ABS. MONOCYTES: 0.8 10*3/uL (ref 0.0–1.0)
ABS. MONOCYTES: 0.7 10*3/uL (ref 0.0–1.0)
ABS. NEUTROPHILS: 7.7 10*3/uL (ref 1.8–8.0)
ABS. NEUTROPHILS: 6.7 10*3/uL (ref 1.8–8.0)
ABSOLUTE NRBC: 0 10*3/uL (ref 0.00–0.01)
ABSOLUTE NRBC: 0 10*3/uL (ref 0.00–0.01)
BASOPHILS: 0 % (ref 0–1)
BASOPHILS: 0 % (ref 0–1)
EOSINOPHILS: 1 % (ref 0–7)
EOSINOPHILS: 0 % (ref 0–7)
HCT: 25.8 % — ABNORMAL LOW (ref 36.6–50.3)
HCT: 21.7 % — ABNORMAL LOW (ref 36.6–50.3)
HGB: 8.3 g/dL — ABNORMAL LOW (ref 12.1–17.0)
HGB: 7.3 g/dL — ABNORMAL LOW (ref 12.1–17.0)
IMMATURE GRANULOCYTES: 1 % — ABNORMAL HIGH (ref 0.0–0.5)
IMMATURE GRANULOCYTES: 0 % (ref 0.0–0.5)
LYMPHOCYTES: 5 % — ABNORMAL LOW (ref 12–49)
LYMPHOCYTES: 4 % — ABNORMAL LOW (ref 12–49)
MCH: 30.5 PG (ref 26.0–34.0)
MCH: 31.9 PG (ref 26.0–34.0)
MCHC: 32.2 g/dL (ref 30.0–36.5)
MCHC: 33.6 g/dL (ref 30.0–36.5)
MCV: 94.9 FL (ref 80.0–99.0)
MCV: 94.8 FL (ref 80.0–99.0)
MONOCYTES: 9 % (ref 5–13)
MONOCYTES: 9 % (ref 5–13)
MPV: 10.9 FL (ref 8.9–12.9)
MPV: 11.3 FL (ref 8.9–12.9)
NEUTROPHILS: 84 % — ABNORMAL HIGH (ref 32–75)
NEUTROPHILS: 87 % — ABNORMAL HIGH (ref 32–75)
NRBC: 0 PER 100 WBC
NRBC: 0 PER 100 WBC
PLATELET: 182 10*3/uL (ref 150–400)
PLATELET: 147 10*3/uL — ABNORMAL LOW (ref 150–400)
RBC: 2.72 M/uL — ABNORMAL LOW (ref 4.10–5.70)
RBC: 2.29 M/uL — ABNORMAL LOW (ref 4.10–5.70)
RDW: 13 % (ref 11.5–14.5)
RDW: 12.9 % (ref 11.5–14.5)
WBC: 9.2 10*3/uL (ref 4.1–11.1)
WBC: 7.7 10*3/uL (ref 4.1–11.1)

## 2019-01-25 LAB — LACTIC ACID
Lactic Acid: 0.6 MMOL/L (ref 0.4–2.0)
Lactic acid: 0.6 MMOL/L (ref 0.4–2.0)

## 2019-01-25 LAB — NT-PRO BNP: NT pro-BNP: 6472 PG/ML — ABNORMAL HIGH (ref <125)

## 2019-01-25 LAB — EKG, 12 LEAD, INITIAL
Atrial Rate: 96 {beats}/min
Calculated P Axis: 60 degrees
Calculated R Axis: 59 degrees
Calculated T Axis: 64 degrees
Diagnosis: NORMAL
P-R Interval: 184 ms
Q-T Interval: 404 ms
QRS Duration: 86 ms
QTC Calculation (Bezet): 510 ms
Ventricular Rate: 96 {beats}/min

## 2019-01-25 LAB — TYPE & SCREEN
ABO/Rh(D): O POS
Antibody screen: NEGATIVE

## 2019-01-25 LAB — PROTHROMBIN TIME + INR
INR: 1.1 (ref 0.9–1.1)
Prothrombin time: 11.2 s — ABNORMAL HIGH (ref 9.0–11.1)

## 2019-01-25 LAB — PTT: aPTT: 31.7 s (ref 22.1–32.0)

## 2019-01-25 LAB — SAMPLES BEING HELD

## 2019-01-25 LAB — TROPONIN I: Troponin-I, Qt.: <0.05 ng/mL (ref <0.05)

## 2019-01-25 LAB — LD: LD: 266 U/L — ABNORMAL HIGH (ref 85–241)

## 2019-01-25 LAB — D DIMER: D-dimer: 1.15 mg/L FEU — ABNORMAL HIGH (ref 0.00–0.65)

## 2019-01-25 LAB — FERRITIN
Ferritin: 190 NG/ML (ref 26–388)
Ferritin: 190 NG/ML (ref 26–388)

## 2019-01-25 LAB — CBC WITH AUTO DIFFERENTIAL
Basophils %: 0 % (ref 0–1)
Basophils %: 0 % (ref 0–1)
Basophils Absolute: 0 10*3/uL (ref 0.0–0.1)
Basophils Absolute: 0 10*3/uL (ref 0.0–0.1)
Eosinophils %: 1 % (ref 0–7)
Eosinophils %: 0 % (ref 0–7)
Eosinophils Absolute: 0.1 10*3/uL (ref 0.0–0.4)
Eosinophils Absolute: 0 10*3/uL (ref 0.0–0.4)
Granulocyte Absolute Count: 0.1 10*3/uL — ABNORMAL HIGH (ref 0.00–0.04)
Granulocyte Absolute Count: 0 10*3/uL (ref 0.00–0.04)
Hematocrit: 25.8 % — ABNORMAL LOW (ref 36.6–50.3)
Hematocrit: 21.7 % — ABNORMAL LOW (ref 36.6–50.3)
Hemoglobin: 8.3 g/dL — ABNORMAL LOW (ref 12.1–17.0)
Hemoglobin: 7.3 g/dL — ABNORMAL LOW (ref 12.1–17.0)
Immature Granulocytes: 1 % — ABNORMAL HIGH (ref 0.0–0.5)
Immature Granulocytes: 0 % (ref 0.0–0.5)
Lymphocytes %: 5 % — ABNORMAL LOW (ref 12–49)
Lymphocytes %: 4 % — ABNORMAL LOW (ref 12–49)
Lymphocytes Absolute: 0.5 10*3/uL — ABNORMAL LOW (ref 0.8–3.5)
Lymphocytes Absolute: 0.3 10*3/uL — ABNORMAL LOW (ref 0.8–3.5)
MCH: 30.5 PG (ref 26.0–34.0)
MCH: 31.9 PG (ref 26.0–34.0)
MCHC: 32.2 g/dL (ref 30.0–36.5)
MCHC: 33.6 g/dL (ref 30.0–36.5)
MCV: 94.9 FL (ref 80.0–99.0)
MCV: 94.8 FL (ref 80.0–99.0)
MPV: 10.9 FL (ref 8.9–12.9)
MPV: 11.3 FL (ref 8.9–12.9)
Monocytes %: 9 % (ref 5–13)
Monocytes %: 9 % (ref 5–13)
Monocytes Absolute: 0.8 10*3/uL (ref 0.0–1.0)
Monocytes Absolute: 0.7 10*3/uL (ref 0.0–1.0)
NRBC Absolute: 0 10*3/uL (ref 0.00–0.01)
NRBC Absolute: 0 10*3/uL (ref 0.00–0.01)
Neutrophils %: 84 % — ABNORMAL HIGH (ref 32–75)
Neutrophils %: 87 % — ABNORMAL HIGH (ref 32–75)
Neutrophils Absolute: 7.7 10*3/uL (ref 1.8–8.0)
Neutrophils Absolute: 6.7 10*3/uL (ref 1.8–8.0)
Nucleated RBCs: 0 PER 100 WBC
Nucleated RBCs: 0 PER 100 WBC
Platelets: 182 10*3/uL (ref 150–400)
Platelets: 147 10*3/uL — ABNORMAL LOW (ref 150–400)
RBC: 2.72 M/uL — ABNORMAL LOW (ref 4.10–5.70)
RBC: 2.29 M/uL — ABNORMAL LOW (ref 4.10–5.70)
RDW: 13 % (ref 11.5–14.5)
RDW: 12.9 % (ref 11.5–14.5)
WBC: 9.2 10*3/uL (ref 4.1–11.1)
WBC: 7.7 10*3/uL (ref 4.1–11.1)

## 2019-01-25 LAB — COMPREHENSIVE METABOLIC PANEL
ALT: 32 U/L (ref 12–78)
ALT: 26 U/L (ref 12–78)
AST: 23 U/L (ref 15–37)
AST: 20 U/L (ref 15–37)
Albumin/Globulin Ratio: 1.1 (ref 1.1–2.2)
Albumin/Globulin Ratio: 1.3 (ref 1.1–2.2)
Albumin: 3.5 g/dL (ref 3.5–5.0)
Albumin: 3 g/dL — ABNORMAL LOW (ref 3.5–5.0)
Alkaline Phosphatase: 87 U/L (ref 45–117)
Alkaline Phosphatase: 74 U/L (ref 45–117)
Anion Gap: 12 mmol/L (ref 5–15)
Anion Gap: 13 mmol/L (ref 5–15)
BUN: 31 MG/DL — ABNORMAL HIGH (ref 6–20)
BUN: 33 MG/DL — ABNORMAL HIGH (ref 6–20)
Bun/Cre Ratio: 4 — ABNORMAL LOW (ref 12–20)
Bun/Cre Ratio: 4 — ABNORMAL LOW (ref 12–20)
CO2: 25 mmol/L (ref 21–32)
CO2: 25 mmol/L (ref 21–32)
Calcium: 7.8 MG/DL — ABNORMAL LOW (ref 8.5–10.1)
Calcium: 7.1 MG/DL — ABNORMAL LOW (ref 8.5–10.1)
Chloride: 96 mmol/L — ABNORMAL LOW (ref 97–108)
Chloride: 95 mmol/L — ABNORMAL LOW (ref 97–108)
Creatinine: 7.7 MG/DL — ABNORMAL HIGH (ref 0.70–1.30)
Creatinine: 8.49 MG/DL — ABNORMAL HIGH (ref 0.70–1.30)
EGFR IF NonAfrican American: 7 mL/min/{1.73_m2} — ABNORMAL LOW (ref >60)
EGFR IF NonAfrican American: 7 mL/min/{1.73_m2} — ABNORMAL LOW (ref >60)
GFR African American: 9 mL/min/{1.73_m2} — ABNORMAL LOW (ref >60)
GFR African American: 8 mL/min/{1.73_m2} — ABNORMAL LOW (ref >60)
Globulin: 3.1 g/dL (ref 2.0–4.0)
Globulin: 2.3 g/dL (ref 2.0–4.0)
Glucose: 92 mg/dL (ref 65–100)
Glucose: 76 mg/dL (ref 65–100)
Potassium: 3.8 mmol/L (ref 3.5–5.1)
Potassium: 3.8 mmol/L (ref 3.5–5.1)
Sodium: 133 mmol/L — ABNORMAL LOW (ref 136–145)
Sodium: 133 mmol/L — ABNORMAL LOW (ref 136–145)
Total Bilirubin: 0.6 MG/DL (ref 0.2–1.0)
Total Bilirubin: 0.4 MG/DL (ref 0.2–1.0)
Total Protein: 6.6 g/dL (ref 6.4–8.2)
Total Protein: 5.3 g/dL — ABNORMAL LOW (ref 6.4–8.2)

## 2019-01-25 LAB — TYPE AND SCREEN
ABO/Rh: O POS
Antibody Screen: NEGATIVE

## 2019-01-25 LAB — EKG 12-LEAD
Atrial Rate: 96 {beats}/min
Diagnosis: NORMAL
P Axis: 60 degrees
P-R Interval: 184 ms
Q-T Interval: 404 ms
QRS Duration: 86 ms
QTc Calculation (Bazett): 510 ms
R Axis: 59 degrees
T Axis: 64 degrees
Ventricular Rate: 96 {beats}/min

## 2019-01-25 LAB — APTT: aPTT: 31.7 s (ref 22.1–32.0)

## 2019-01-25 LAB — D-DIMER, QUANTITATIVE: D-Dimer, Quant: 1.15 mg/L FEU — ABNORMAL HIGH (ref 0.00–0.65)

## 2019-01-25 LAB — LACTATE DEHYDROGENASE: LD: 266 U/L — ABNORMAL HIGH (ref 85–241)

## 2019-01-25 LAB — TROPONIN: Troponin I: <0.05 ng/mL (ref <0.05)

## 2019-01-25 LAB — PROTIME-INR
INR: 1.1 (ref 0.9–1.1)
Protime: 11.2 s — ABNORMAL HIGH (ref 9.0–11.1)

## 2019-01-25 LAB — PROBNP, N-TERMINAL: BNP: 6472 PG/ML — ABNORMAL HIGH (ref <125)

## 2019-01-25 MED ORDER — HEPARIN (PORCINE) 5,000 UNIT/ML IJ SOLN
5000 unit/mL | Freq: Three times a day (TID) | INTRAMUSCULAR | Status: DC
Start: 2019-01-25 — End: 2019-01-27
  Administered 2019-01-25 – 2019-01-27 (×6): via SUBCUTANEOUS

## 2019-01-25 MED ORDER — SODIUM CHLORIDE 0.9 % IJ SYRG
INTRAMUSCULAR | Status: DC | PRN
Start: 2019-01-25 — End: 2019-01-27

## 2019-01-25 MED ORDER — HEPARIN (PORCINE) 1,000 UNIT/ML IJ SOLN
1000 unit/mL | INTRAMUSCULAR | Status: DC | PRN
Start: 2019-01-25 — End: 2019-01-27
  Administered 2019-01-25 – 2019-01-26 (×2): via ARTERIOVENOUS_FISTULA

## 2019-01-25 MED ORDER — HEPARIN (PORCINE) 5,000 UNIT/ML IJ SOLN
5000 unit/mL | Freq: Three times a day (TID) | INTRAMUSCULAR | Status: DC
Start: 2019-01-25 — End: 2019-01-25
  Administered 2019-01-25: 16:00:00 via SUBCUTANEOUS

## 2019-01-25 MED ORDER — CLONAZEPAM 1 MG TAB
1 mg | Freq: Two times a day (BID) | ORAL | Status: DC
Start: 2019-01-25 — End: 2019-01-27
  Administered 2019-01-26 – 2019-01-27 (×3): via ORAL

## 2019-01-25 MED ORDER — ACETAMINOPHEN 325 MG TABLET
325 mg | Freq: Four times a day (QID) | ORAL | Status: DC | PRN
Start: 2019-01-25 — End: 2019-01-27

## 2019-01-25 MED ORDER — SODIUM CHLORIDE 0.9 % IJ SYRG
Freq: Three times a day (TID) | INTRAMUSCULAR | Status: DC
Start: 2019-01-25 — End: 2019-01-27
  Administered 2019-01-25 – 2019-01-27 (×6): via INTRAVENOUS

## 2019-01-25 MED ORDER — SODIUM CHLORIDE 0.9% BOLUS IV
0.9 % | Freq: Once | INTRAVENOUS | Status: AC
Start: 2019-01-25 — End: 2019-01-25
  Administered 2019-01-25: 17:00:00 via INTRAVENOUS

## 2019-01-25 MED ORDER — SODIUM CHLORIDE 0.9 % IJ SYRG
Freq: Once | INTRAMUSCULAR | Status: AC
Start: 2019-01-25 — End: 2019-01-25
  Administered 2019-01-25: 17:00:00 via INTRAVENOUS

## 2019-01-25 MED ORDER — CLOMIPRAMINE 25 MG CAP
25 mg | Freq: Every evening | ORAL | Status: DC
Start: 2019-01-25 — End: 2019-01-27
  Administered 2019-01-26: 02:00:00 via ORAL

## 2019-01-25 MED ORDER — PREGABALIN 100 MG CAP
100 mg | Freq: Every evening | ORAL | Status: DC
Start: 2019-01-25 — End: 2019-01-27
  Administered 2019-01-26 – 2019-01-27 (×2): via ORAL

## 2019-01-25 MED ORDER — ACETAMINOPHEN 650 MG RECTAL SUPPOSITORY
650 mg | Freq: Four times a day (QID) | RECTAL | Status: DC | PRN
Start: 2019-01-25 — End: 2019-01-27

## 2019-01-25 MED ORDER — IOPAMIDOL 76 % IV SOLN
370 mg iodine /mL (76 %) | Freq: Once | INTRAVENOUS | Status: AC
Start: 2019-01-25 — End: 2019-01-25
  Administered 2019-01-25: 17:00:00 via INTRAVENOUS

## 2019-01-25 MED ORDER — TAMSULOSIN SR 0.4 MG 24 HR CAP
0.4 mg | Freq: Every day | ORAL | Status: DC
Start: 2019-01-25 — End: 2019-01-27
  Administered 2019-01-26 – 2019-01-27 (×2): via ORAL

## 2019-01-25 MED ORDER — .PHARMACY TO SUBSTITUTE PER PROTOCOL
Status: DC | PRN
Start: 2019-01-25 — End: 2019-01-27

## 2019-01-25 MED ORDER — LIOTHYRONINE 5 MCG TAB
5 mcg | Freq: Every day | ORAL | Status: DC
Start: 2019-01-25 — End: 2019-01-27

## 2019-01-25 MED FILL — MONOJECT PREFILL ADVANCED 0.9 % SODIUM CHLORIDE INJECTION SYRINGE: INTRAMUSCULAR | Qty: 40

## 2019-01-25 MED FILL — CLOMIPRAMINE 25 MG CAP: 25 mg | ORAL | Qty: 2

## 2019-01-25 MED FILL — NORMAL SALINE FLUSH 0.9 % INJECTION SYRINGE: INTRAMUSCULAR | Qty: 10

## 2019-01-25 MED FILL — ISOVUE-370  76 % INTRAVENOUS SOLUTION: 370 mg iodine /mL (76 %) | INTRAVENOUS | Qty: 100

## 2019-01-25 MED FILL — HEPARIN (PORCINE) 1,000 UNIT/ML IJ SOLN: 1000 unit/mL | INTRAMUSCULAR | Qty: 3.6

## 2019-01-25 NOTE — ACP (Advance Care Planning) (Signed)
 Advance Care Planning     Advance Care Planning Activator (Inpatient)  Conversation Note      Date of ACP Conversation: 01/25/19     Conversation Conducted with:   Patient with Decision Making Capacity    ACP Activator: John Friedman, MSW    Health Care Decision Maker:    Current Designated Health Care Decision Maker:   Primary Decision Maker: John Friedman,John Friedman - Parent [phone]    Secondary Decision Maker: John Friedman (biological mother) - Other Non-relative - [phone]    Supplemental (Other) Decision Maker: John Friedman, John Friedman - Sister [phone]      Care Preferences    Ventilation:  If you were in your present state of health and suddenly became very ill and were unable to breathe on your own, what would your preference be about the use of a ventilator (breathing machine) if it were available to you?      If patient would desire the use of a ventilator (breathing machine), answer yes, if not no:yes    If your health worsens and it becomes clear that your chance of recovery is unlikely, what would your preference be about the use of a ventilator (breathing machine) if it were available to you?     Would the patient desire the use of a ventilator (breathing machine)?  YES      Resuscitation  CPR works best to restart the heart when there is a sudden event, like a heart attack, in someone who is otherwise healthy. Unfortunately, CPR does not typically restart the heart for people who have serious health conditions or who are very sick.    In the event your heart stopped as a result of an underlying serious health condition, would you want attempts to be made to restart your heart (answer yes for attempt to resuscitate) or would you prefer a natural death (answer no for do not attempt to resuscitate)? yes      []  Yes  []  No   Educated Patient / Decision Maker regarding differences between Advance Directives and portable DNR orders.    Length of ACP Conversation in minutes:  10  minutes    Conversation Outcomes:  [x]  ACP discussion completed  []  Existing advance directive reviewed with patient; no changes to patient's previously recorded wishes     []  New Advance Directive completed   []  Portable Do Not Resuscitate prepared for Provider review and signature  []  POLST/POST/MOLST/MOST prepared for Provider review and signature      Follow-up plan:    []  Schedule follow-up conversation to continue planning  []  Referred individual to Provider for additional questions/concerns   [x]  Advised patient/agent/surrogate to review completed ACP document and update if needed with changes in condition, patient preferences or care setting     []  This note routed to one or more involved healthcare providers       Spoke with patient on the phone when he arrived to his room on the unit. Began discussing ACP. He had discussion with palliative on 01-22-2019 during his last admission. His Healthcare Decision Makers are as listed above.  Encouraged him to fill out the AMD during this admission. Patient stated he was not ready to do that at this time.     John Friedman, MSW

## 2019-01-25 NOTE — ED Notes (Signed)
Pt states that he is having SOB this morning while getting ready for work. Pt states that he was diagnosed with renal failure and started on HD in the past 2 weeks. [name] due to have HD this evening and appears very pale.

## 2019-01-25 NOTE — ED Provider Notes (Signed)
The history is provided by the patient.   Shortness of Breath   [name]. The average episode lasts 1 day. The problem occurs continuously.The current episode started 12 to 24 hours ago. The problem has been gradually worsening. Associated symptoms include wheezing and orthopnea. Pertinent negatives include no fever, no cough, no sputum production, no chest pain, no syncope, no vomiting and no rash. It is unknown what precipitated the problem. He has tried nothing for the symptoms. He has had prior hospitalizations (with recurrent effusions). Associated medical issues comments: ESRD on dialysis.        Past Medical History:   Diagnosis Date   ??? Anemia 01/21/2019   ??? ARF (acute renal failure) (HCC)     on HD   ??? BPH (benign prostatic hyperplasia)    ??? Cancer ([place])     left testicle- stage III, nonseminomatous germ cell tumor, s/p orchiectomy & BEP x 3 cycles   ??? Chronic fatigue January 1987   ??? Chronic kidney disease     renal failure, HD   ??? Essential tremor    ??? GERD (gastroesophageal reflux disease)    ??? 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 Hyponatremia 01/21/2019   ??? Hypothyroid    ??? 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 Lumbar radiculopathy 08/10/2012    S/p steroid injection by Dr Laverta Galt 08/01/12    ??? Normal cardiac stress test 09/05/11   ??? Pleural effusion, bilateral 01/21/2019   ??? Pulmonary edema 01/21/2019   ??? Sleep disturbances     acting out in his sleep   ??? Thrombocytopenia ([place]) 01/21/2019       Past Surgical History:   Procedure Laterality Date   ??? HX CHOLECYSTECTOMY  12/05/2011    abnormal HIDA scan (EF < 2%)   ??? HX COLONOSCOPY  07/12/10    hyperplastic polyp, repeat 10 yrs   ??? HX COLONOSCOPY  02/23/15    normal   ??? HX CYST REMOVAL Right 01/06/2016    3rd toe ganglion cyst removal - podiatry (Dr Raliegh Ip)   ??? HX GI  09/09/11    EGD- normal   ??? HX HEENT Left 06/2012    tonsilar bx: benign tissue (Dr Buddy Duty)   ??? HX ORCHIECTOMY Left 2002   ??? HX ORTHOPAEDIC      pectus  excavatum surgery @ age 59   ??? HX SHOULDER ARTHROSCOPY Right 08/13/2018         Family History:   Problem Relation Age of Onset   ??? No Known Problems Mother    ??? Parkinsonism Father    ??? Arthritis-osteo Father         scoliosis   ??? No Known Problems Brother    ??? No Known Problems Brother    ??? No Known Problems Sister    ??? Cancer Maternal Grandfather         prostate   ??? Cancer Paternal Grandfather         prostate       Social History     Socioeconomic History   ??? Marital status: SINGLE     Spouse name: Not on file   ??? Number of children: Not on file   ??? Years of education: Not on file   ??? Highest education level: Not on file   Occupational History   ??? Not on file   Social Needs   ??? Financial resource strain: Not  on file   ??? Food insecurity     Worry: Not on file     Inability: Not on file   ??? Transportation needs     Medical: Not on file     Non-medical: Not on file   Tobacco Use   ??? Smoking status: Never Smoker   ??? Smokeless tobacco: Never Used   Substance and Sexual Activity   ??? Alcohol use: Yes     Comment: rarely   ??? Drug use: No   ??? Sexual activity: Not Currently     Partners: Female   Lifestyle   ??? Physical activity     Days per week: Not on file     Minutes per session: Not on file   ??? Stress: Not on file   Relationships   ??? Social Product manager on phone: Not on file     Gets together: Not on file     Attends religious service: Not on file     Active member of club or organization: Not on file     Attends meetings of clubs or organizations: Not on file     Relationship status: Not on file   ??? Intimate partner violence     Fear of current or ex partner: Not on file     Emotionally abused: Not on file     Physically abused: Not on file     Forced sexual activity: Not on file   Other Topics Concern   ??? Not on file   Social History Narrative   ??? Not on file         ALLERGIES: Latex    Review of Systems   Constitutional: Negative for fever.   Respiratory: Positive for shortness of breath and wheezing.  Negative for cough and sputum production.    Cardiovascular: Positive for orthopnea. Negative for chest pain and syncope.   Gastrointestinal: Negative for vomiting.   Skin: Negative for rash.   All other systems reviewed and are negative.      Vitals:    01/25/19 0853 01/25/19 0900   BP: (!) 148/94 140/90   Pulse: 99 97   Resp: 20 23   Temp: 98.4 ??F (36.9 ??C)    SpO2: 91% (!) 89%            Physical Exam  Vitals signs and nursing note reviewed.   Constitutional:       General: He is not in acute distress.     Appearance: He is well-developed.   HENT:      Head: Normocephalic and atraumatic.   Eyes:      Conjunctiva/sclera: Conjunctivae normal.   Neck:      Musculoskeletal: Neck supple.      Trachea: No tracheal deviation.   Cardiovascular:      Rate and Rhythm: Normal rate and regular rhythm.   Pulmonary:      Effort: Pulmonary effort is normal. No respiratory distress.      Breath sounds: Wheezing (faint bilateral expiratory) present. No decreased breath sounds.   Chest:      Chest wall: Edema (bilateral leg) present.   Abdominal:      General: There is no distension.   Musculoskeletal: Normal range of motion.         General: No deformity.   Skin:     General: Skin is warm and dry.   Neurological:      Mental Status: He is alert.  Cranial Nerves: No cranial nerve deficit.   Psychiatric:         Behavior: Behavior normal.          MDM     53 year old male with history of end-stage renal disease on dialysis last received Wednesday presents with ongoing shortness of breath worse with lying flat that started last night.  This is a recurrent issue for him.  He has had a history of pleural effusions that required drainage bilaterally but they are not evident on imaging today.  He had recent COVID rule out as well as a CT angiogram for PE rule out during his recent admission.  He is recurrently hypoxemic today requiring 2 L of oxygen by nasal cannula.  He feels better after oxygen administration.  VQ scan ordered for  repeat PE evaluation but may need home oxygen therapy set up.  He is due for dialysis later today at 4 PM.    Procedures    EKG 0904: Rate 96, normal sinus rhythm, prolonged QT interval. No significant change from last tracing.     Hospitalist Perfect Serve for Admission  10:08 AM    ED Room Number: ER08/08  Patient Name and age:  John Friedman 53 y.o.  male  Working Diagnosis:   1. Acute respiratory failure with hypoxemia ([place])        COVID-19 Suspicion:  no    Code Status:  Full Code  Readmission: yes  Isolation Requirements:  no  Recommended Level of Care:  telemetry  Department:[facility] ED - [phone]

## 2019-01-25 NOTE — Progress Notes (Signed)
 [facility] [place]  Acutes  [id number]    Vitals   Pre   Post   Assessment   Pre   Post     Temp  Temp: 99 F (37.2 C) (01/25/19 1637)  99 LOC  A&Ox4, cooperative, follows commands A&Ox4, cooperative, follows commands   HR   88 83 Lungs   Diminished b/l  Diminished b.l   B/P  152/91 144/93 Cardiac   Regular, S1, S2  Regular, S1, S2   Resp   16 16 Skin   Pale,   R permacath Pale,   R permacath   Pain level  0 0 Edema  1-2+ b/l lower extremities   1-2+ b/l lower extremities   Orders:    Duration:   Start:    1637 End:    2009 Total:   3.5 hours   Dialyzer:   Dialyzer/Set Up Inspection: Revaclear(no supply of F180) (01/25/19 1637)   K Bath:   Dialysate K (mEq/L): 3.5 (01/25/19 1637)   Ca Bath:   Dialysate CA (mEq/L): 2.5 (01/25/19 1637)   Na/Bicarb:   Dialysate NA (mEq/L): 138 (01/25/19 1637)   Target Fluid Removal:   Goal/Amount of Fluid to Remove (mL): 3500 mL (01/25/19 1637)   Access     Type & Location:   RIJ CVC: Dressing CDI, dated 01/21/19. No s/s of infection. Both lumens aspirate & flush well. Running well at BFR 400.        Labs     Obtained/Reviewed   Critical Results Called   Date when labs were drawn-  Hgb-    HGB   Date Value Ref Range Status   01/25/2019 8.3 (L) 12.1 - 17.0 g/dL Final     K-    Potassium   Date Value Ref Range Status   01/25/2019 3.8 3.5 - 5.1 mmol/L Final     Ca-   Calcium   Date Value Ref Range Status   01/25/2019 7.8 (L) 8.5 - 10.1 MG/DL Final     Bun-   BUN   Date Value Ref Range Status   01/25/2019 31 (H) 6 - 20 MG/DL Final     Creat-   Creatinine   Date Value Ref Range Status   01/25/2019 7.70 (H) 0.70 - 1.30 MG/DL Final        Medications/ Blood Products Given     Name   Dose   Route and Time     Heparin 1:1000 3600 Units  CVC dwell at end of treatment             Blood Volume Processed (BVP):    77.7 L Net Fluid   Removed:  3500 mL   Comments   Time Out Done: Yes  Primary Nurse Rpt Pre: Vina Aurora, RN  Primary Nurse Rpt Post: Vina Aurora, RN  Pt Education:  Ordered HD treatment  Care Plan: Infection Prevention  Tx Summary:     SBAR received from Primary RN. Pt had just arrived from ER to floor room, Pt A&Ox4, reviewed ordered dialysis treatment, pt in agreement. Physical assessment performed. Machine preparation and safety checks performed. Reviewed Consent with patient, pt signed & will place on file.   1637: Both lumens of permcath disinfected with Prevantics per policy.  Each lumen aspirated for blood return and flushed with Normal Saline per policy. Labs drawn per request/order. VSS. Dialysis Tx initiated.   1730: Pt resting quietly, tolerating tx well, no complaints of pain.  1830: Pt resting  quietly, tolerating tx well, no complaints of pain.  1930: Pt resting quietly, tolerating tx well, no complaints of pain.  2009: Tx ended. VSS. All possible blood returned to patient. Central line catheter flushed with normal saline per policy.  Ports disinfected with Prevantics per policy, lines disconnected, Heparin dwells instilled, and lines capped using aseptic technique. Bed locked and in the lowest position, call bell and belongings in reach. SBAR given to Primary, Charity fundraiser. Patient is stable at time of my departure.    All Dialysis related medications have been reviewed.      Admiting Diagnosis: Acute hypoxic respiratory failure  Pt's [facility] clinic- [facility] MWF  Consent signed - Informed Consent Verified: Yes (01/25/19 1637)  [facility] Consent - Signed and will file  Hepatitis Status- 01/18/19 HBsAG Negative, 01/18/19 HBsAB <10 Susceptible  Machine #- Machine Number: [id number] (01/25/19 1637)  Telemetry status- Medical  Pre-dialysis wt.-  N/A

## 2019-01-25 NOTE — H&P (Signed)
H&P by Donato Schultz, MD at 01/25/19 1209                Author: Donato Schultz, MD  Service: Internal Medicine  Author Type: Physician       Filed: 01/25/19 1219  Date of Service: 01/25/19 1209  Status: Signed          Editor: Donato Schultz, MD (Physician)                               Hospitalist History and Physical   Donato Schultz, MD   Answering service: [phone] OR [phone] from in house phone            Date of Service:  01/25/2019   NAME:  John Friedman   DOB:  1966/02/17   MRN:  [id number]   Primary Care Provider: Antony Haste, MD      Chief Complaint:      Chief Complaint       Patient presents with        ?  Shortness of Breath             History of Present Illness:        John Friedman is a 53 y.o. 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  Patient is awake, alert and oriented, able answer my question and follow my request.  Data also obtained from the ED staff.  As per collective  reports, patient has been feeling shortness of breath for last few days, reports that he was expecting shortness of breath to get better with time and dialysis but he was feeling that his shortness of breath is not improving.  His last session of dialysis  was on Wednesday and has been having progressions of breath since then.  His shortness of breath is worsened with lying flat last night and it has been a recurrent issue for him.  He woke up this morning shortness of breath and was gasping for air.  He  has history of vertebral effusion which required drainage but x-ray today does not show any acute pathology.  Patient is feeling as if he is drowning in water and is having dry cough with crackles on exam.  He came to ED for evaluation, he was hypoxemic  desaturating at 89% on room air,  requiring 2 L via nasal cannula.  He was tachypneic as well.  Blood work reveals no acute changes, BMP shows elevated creatinine it is expected.  He is feeling better on supplemental oxygen but very anxious about his lack  of improvement despite dialysis.  I called and discussed with patient's nephrologist Dr. Iona Beard who recommends to get repeat echocardiogram and cardiology consult.  He is also agreeable to have CT angio chest done to rule out underlying PE.  Patient denies  any fevers, cough, nausea common, headache, blurring of vision, abdominal pain, chest pain, back pain, diarrhea or constipation, fall or trauma or any recent sick contacts.  His recent cover testing was negative.      Chart reviewed at length.   Review of Systems:   Pertinent positives noted in HPI. All other systems were reviewed and are negative.      Past Medical and surgical history:  Past Medical History:        Diagnosis  Date         ?  Anemia  01/21/2019     ?  ARF (acute renal failure) (HCC)            on HD         ?  BPH (benign prostatic hyperplasia)       ?  Cancer ([place])            left testicle- stage III, nonseminomatous germ cell tumor, s/p orchiectomy & BEP x 3 cycles         ?  Chronic fatigue  January 1987     ?  Chronic kidney disease            renal failure, HD         ?  Essential tremor       ?  GERD (gastroesophageal reflux disease)       ?  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  Hyponatremia  01/21/2019     ?  Hypothyroid       ?  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  Lumbar radiculopathy  08/10/2012          S/p steroid injection by Dr Laverta  08/01/12          ?  Normal cardiac stress test  09/05/11     ?  Pleural effusion, bilateral  01/21/2019     ?  Pulmonary edema  01/21/2019     ?  Sleep disturbances            acting out in his sleep         ?  Thrombocytopenia ([place])  01/21/2019           Past Surgical History:         Procedure  Laterality  Date          ?  HX  CHOLECYSTECTOMY    12/05/2011          abnormal HIDA scan (EF < 2%)          ?  HX COLONOSCOPY    07/12/10          hyperplastic polyp, repeat 10 yrs          ?  HX COLONOSCOPY    02/23/15          normal          ?  HX CYST REMOVAL  Right  01/06/2016          3rd toe ganglion cyst removal - podiatry (Dr Raliegh Ip)          ?  HX GI    09/09/11          EGD- normal          ?  HX HEENT  Left  06/2012          tonsilar bx: benign tissue (Dr Buddy Duty)          ?  HX ORCHIECTOMY  Left  2002     ?  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  HX SHOULDER ARTHROSCOPY  Right  08/13/2018  Home medications:     Prior to Admission medications             Medication  Sig  Start Date  End Date  Taking?  Authorizing Provider            clomiPRAMINE (ANAFRANIL) 50 mg capsule  Take 50 mg by mouth nightly.        Provider, Historical     tamsulosin (Flomax) 0.4 mg capsule  Take 0.4 mg by mouth daily.        Provider, Historical     liothyronine (CYTOMEL) 5 mcg tablet  Take 5 mcg by mouth daily.        Provider, Historical     OTHER  Supplemental drink - phosphatidylcholine with omega 3 and 6 and dextrose - to help with tremors.        Provider, Historical     pregabalin (Lyrica) 300 mg capsule  Take 300 mg by mouth nightly. For "sleep disturbances" - pt has had injuries from falling out of bed during sleep        Provider, Historical     armodafinil (NUVIGIL) 200 mg tab  Take 1 Tab by mouth daily as needed.        Provider, Historical     propranolol (INDERAL) 20 mg tablet  Take 20 mg by mouth daily. For tremors        Provider, Historical            clonazepam (KLONOPIN) 1 mg tablet  Take 1 mg by mouth two (2) times a day.        Provider, Historical           Allergies:     Allergies        Allergen  Reactions         ?  Latex  Other (comments)             redness           Family history:      Family History         Problem  Relation  Age of Onset          ?  No Known Problems  Mother       ?   Parkinsonism  Father       ?  Arthritis-osteo  Father                scoliosis          ?  No Known Problems  Brother       ?  No Known Problems  Brother       ?  No Known Problems  Sister       ?  Cancer  Maternal Grandfather                prostate          ?  Cancer  Paternal Grandfather                prostate            SOCIAL HISTORY:   Patient resides at Home.   Patient ambulates without any device.    Smoking history: reports that he has never smoked. He has never used smokeless tobacco.   Drug History:  reports no history of drug use.   Alcohol history:  reports current alcohol use.        Objective:  Physical Exam:    Visit Vitals      BP  140/90     Pulse  97     Temp  98.4 ??F (36.9 ??C)     Resp  23        SpO2  (!) 89%        General appearance: alert, cooperative, severe distress, appears older than stated age, anxious   Head: Normocephalic, without obvious abnormality, atraumatic   Eyes: Conjunctival pallor is noted, no scleral icterus   Throat: Oral mucosa is moist, no oropharyngeal exudates   Lungs: Coarse crackles at the bases, no wheezing, mild respiratory distress normal sinus rhythm, no murmur gallops or rubs   Heart:    Abdomen: Soft, nontender, no guarding, no rebound   Extremities: 1-2+ pitting edema bilateral lower extremities   Pulses: 2+ and symmetric   Skin: Skin color, texture, turgor normal. No rashes or lesions   Neurologic: Grossly normal      ECG:  normal sinus rhythm       Laboratory and other diagnostic Data Review:    All diagnostic labs and studies have been reviewed.      Xr Chest Pa Lat      Result Date: 01/25/2019   Indication:  shortness of breath, history of pleural effusions Exam: PA and lateral views of the chest. Direct comparison is made to prior CXR dated January 24, 2019. Findings: Cardiomediastinal silhouette is stable. Dual-lumen central venous catheter is  unchanged in position. There has been interval improvement of pulmonary vascular congestive changes. There  is persistent bibasilar patchy airspace disease. No pleural fluid is visualized. There is no pneumothorax.       IMPRESSION: Improved bilateral aeration, as described above.       Xr Chest Pa Lat      Result Date: 01/21/2019   Indication:  SOB. Orthopnea. Exam: PA and lateral views of the chest. Direct comparison is made to prior CXR dated January 2017. Findings: Cardiomediastinal silhouette is partially obscured by patchy bibasilar airspace disease and small bilateral pleural  effusions. There is also pulmonary vascular prominence and bilateral interstitial edema. Central venous catheter extends to the right atrium. There is no pneumothorax.       IMPRESSION: Extensive pulmonary vascular congestive changes, as described above.       Cta Chest W Or W Wo Cont      Result Date: 01/21/2019   INDICATION: Shortness of breath and orthopnea. Recent hospitalization. End-stage renal disease. COMPARISON:PA and lateral chest radiograph from earlier today TECHNIQUE:  Routine noncontrast imaging the chest was performed for localization purposes. Then,  following the uneventful intravenous administration of 75 cc [id number], thin helical axial images were obtained through the chest. 3D image postprocessing was performed. CT dose reduction was achieved through use of a standardized protocol tailored for  this examination and automatic exposure control for dose modulation. Adaptive statistical iterative reconstruction (ASIR) was utilized. FINDINGS: CHEST WALL: No chest wall mass or axillary lymphadenopathy. THYROID: No nodule. MEDIASTINUM: No mass or lymphadenopathy.  Right internal jugular tunneled dialysis catheter terminates at the cavoatrial junction. HILA: No mass or lymphadenopathy. THORACIC AORTA: No dissection or aneurysm. PULMONARY ARTERIES: Main pulmonary artery is normal in caliber. No evidence of acute  pulmonary emboli. TRACHEA/BRONCHI: Patent. ESOPHAGUS: No wall thickening or dilatation. HEART: Normal in size. PLEURA:  Moderately large bilateral pleural effusions. LUNGS: Moderately severe pulmonary interstitial edema pattern. Consolidation in the right  middle lobe and bilateral lower lobes. Nodular airspace disease in  the upper lobes bilaterally. INCIDENTALLY IMAGED UPPER ABDOMEN: No focal abnormality. BONES: No destructive bone lesion. ADDITIONAL COMMENTS: N/A       IMPRESSION: No evidence of acute pulmonary embolus. Moderately large bilateral pleural effusions. Moderately severe pulmonary edema pattern. Bilateral nodular airspace disease and bilateral consolidation may indicate superimposed pneumonia; radiographic  follow-up is recommended to assure resolution.      Xr Chest Port      Result Date: 01/24/2019   EXAM: XR CHEST PORT INDICATION: s/p left thoracentesis. Patient in IR holding COMPARISON: Chest x-ray 01/22/2019. FINDINGS: A portable AP radiograph of the chest was obtained at 13:06 hours. Right hemodialysis catheter projects in stable and expected position  with the tip in the region of the atriocaval junction.. The lungs show pulmonary vascular congestion with diffuse interstitial edema. Left pleural effusion has essentially resolved with no pneumothorax. There is been interval accumulation of small right  pleural effusion.. The cardiac and mediastinal contours and pulmonary vascularity are normal.  The bones and soft tissues are grossly within normal limits.       IMPRESSION: 1. No pneumothorax status post left thoracentesis with essentially resolved left pleural effusion. 2. Small right pleural effusion is a cannulated since prior exam. 3. Congestive failure and interstitial pulmonary edema.      Xr Chest Port      Result Date: 01/22/2019   EXAM: XR CHEST PORT INDICATION: post thoracentesis, evaluate effusion COMPARISON: 01/21/2019 FINDINGS: A portable AP radiograph of the chest was obtained at 1548 hours. The left pleural effusion is diminished in size with small residual. Consolidative opacities  in the left lower lung  are again shown. There is also an appearance of bilateral perihilar pulmonary opacification consistent with mild pulmonary edema. No pneumothorax is shown. Cardiac and mediastinal contours are stable. Hilar contours are obscured.  Double lumen right IJ line is again shown terminating at the cavoatrial junction.       IMPRESSION: 1. Diminished left pleural effusion with small residual. No pneumothorax. 2. Left basilar consolidation again shown. 3. Borderline-mild appearance of pulmonary edema.      US Thoracentesis Lt Ndl W Image      Result Date: 01/24/2019   PROCEDURE: Thoracentesis INDICATION: Pleural effusion OPERATING PHYSICIAN: Carlus Pavlov, M.D. ESTIMATED BLOOD LOSS: Less than 5 cc. SPECIMENS REMOVED: 1050 mL of clear pleural fluid. FLOURO TIME: None COMPLICATIONS: None immediate. Procedure and findings:  The risks and benefits of the procedure were discussed with the patient. Written consent was obtained. Preliminary ultrasound imaging of the left chest demonstrated a large pleural effusion. An appropriate site for thoracentesis was marked on the skin.  The patient was prepped and draped in sterile fashion. 1% lidocaine was injected locally. A dermatotomy was made. A thoracentesis catheter was then inserted into the pleural space using trocar technique. Approximately 1050 ml of yellow fluid was obtained.  The catheter was then removed. The patient tolerated the procedure well. There were no immediate complications.       IMPRESSION: Successful ultrasound guided left  thoracentesis yielding approximately 1050 ml of fluid.       US Thoracentesis Rt Ndl W Image      Result Date: 01/22/2019   CLINICAL HISTORY: Moderate right pleural effusion. COMPARISON: None. PROCEDURE: Ultrasound-guided right thoracentesis with 8 French pleural pigtail catheter placement and removal. OPERATORS: Marilynn Rail, MD. ANESTHESIA: buffered lidocaine local anesthetic.  TECHNIQUE AND FINDINGS: The patient's relevant allergies,  medications, laboratory results, and history were reviewed. The patient was identified by name and date  of birth. The procedure, benefits, risks, and alternatives (including not having the procedure)  were discussed. All of the patient's questions were answered. Informed verbal and written consent was obtained. The patient was positioned in a seated position and kyphosis was exaggerated by leaning forward. Targeted sonography of the right posterolateral  chest was performed. The right pleural effusion was identified under ultrasound guidance and the overlying skin site was marked. Focused sonography of the left posterolateral chest showed at least a moderate left effusion, with less volume than on the  right. A time-out procedure using two patient identifiers was performed and laterality confirmed. The skin was prepped and draped using usual sterile technique. Subcutaneous and deep tissues were anesthetized with local anesthetic. A small skin nick was  made. A 8 French pigtail catheter was inserted into the right pleural space. 120 cc of clear yellow pleural fluid was aspirated and sent to pathology for analysis as ordered. A total of 1500 cc was aspirated for therapeutic thoracentesis, and the catheter  was removed. There were no immediate complications. The patient tolerated the procedure well.       IMPRESSION: Uncomplicated ultrasound-guided right thoracentesis with 8 French pigtail catheter placement and removal.           Patient Vitals for the past 12 hrs:            Temp  Pulse  Resp  BP  SpO2            01/25/19 0900  --  97  23  140/90  (!) 89 %            01/25/19 0853  98.4 ??F (36.9 ??C)  99  20  (!) 148/94  91 %             Recent Labs           01/25/19   0856     WBC  9.2     HGB  8.3*     HCT  25.8*        PLT  182          Recent Labs           01/25/19   0856     NA  133*     K  3.8     CL  96*     CO2  25     BUN  31*     CREA  7.70*     GLU  92        CA  7.8*          Recent Labs            01/25/19   0856     ALT  32     AP  87     TBILI  0.6     TP  6.6     ALB  3.5        GLOB  3.1        No results for input(s): INR, PTP, APTT, INREXT in the last 72 hours.    No results for input(s): FE, TIBC, PSAT, FERR in the last 72 hours.    No results found for: FOL, RBCF    No results for input(s): PH, PCO2, PO2 in the last 72 hours.     Recent Labs           01/25/19   0856        TROIQ  <0.05  Lab Results         Component  Value  Date/Time            Cholesterol, total  184  04/09/2018 07:25 AM       HDL Cholesterol  52  04/09/2018 07:25 AM       LDL, calculated  120 (H)  04/09/2018 07:25 AM            Triglyceride  59  04/09/2018 07:25 AM          Lab Results         Component  Value  Date/Time            Glucose (POC)  87  06/26/2013 02:54 AM          Lab Results         Component  Value  Date/Time            Color  YELLOW/STRAW  08/16/2014 08:53 PM       Appearance  CLEAR  08/16/2014 08:53 PM       Specific gravity  1.005  08/16/2014 08:53 PM       pH (UA)  7.0  08/16/2014 08:53 PM       Protein  NEGATIVE   08/16/2014 08:53 PM       Glucose  NEGATIVE   08/16/2014 08:53 PM       Ketone  15 (A)  08/16/2014 08:53 PM       Bilirubin  NEGATIVE   08/16/2014 08:53 PM       Urobilinogen  0.2  08/16/2014 08:53 PM       Nitrites  NEGATIVE   08/16/2014 08:53 PM       Leukocyte Esterase  NEGATIVE   08/16/2014 08:53 PM       Epithelial cells  FEW  08/16/2014 08:53 PM       Bacteria  NEGATIVE   08/16/2014 08:53 PM       WBC  0-4  08/16/2014 08:53 PM            RBC  0-5  08/16/2014 08:53 PM             Assessment:     Given the patient's current clinical presentation, I have a high level of concern for decompensation if discharged from the emergency department.  Complex decision making was performed, which includes  reviewing the patient's available past medical records, laboratory results, and x-ray films.          My assessment of this patient's clinical condition and my plan of care is as follows.          Active Problems:     Hypoxemia (01/25/2019)              Plan:        -Acute hypoxemic respiratory failure, likely secondary volume overload   - Volume overload with pulmonary edema-chest x-ray is better than previous   O2 support, wean down O2 as needed, will do 6-minute walk test after dialysis is done over the morning   Discussed with Dr. Karna Dupes from nephrology, okay to do CT angio with PE protocol   Echocardiogram done on 01/22/2019 showed small pericardial effusion, will repeat limited echocardiogram to see its progress.  Nephrology recommends a cardiology consult for completion of care given patient's recurrent admissions and unresolved symptoms  despite being on dialysis.      -Thrombocytopenia-now resolved   Avoid volume overload      -  ESRD on HD   Patient is on Monday Wednesday Friday schedule of dialysis, scheduled to get dialysis today, discussed with nephrology, they will arrange for dialysis today      -Anemia of chronic disease   H&H is stable, anemia is actually improving   Further management as per nephrology      - Hyponatremia, mildly to ESRD expected to improve with dialysis      - Hypothyroidism, continue Synthroid      - Chronic fatigue syndrome-continue home medications         Diet: Renal diet   Activity: Out of bed to chair   DVT prophylaxis: Subcu heparin    Isolation precautions: none   Consultations: Nephrology   Anticipated disposition: Home with family   Code status: Full Code      Place as an Observation .   Patient was explained about the risk of admission including and not a complete list including risk of falls,fractures,blood clots,allergic reactions,infections. Patient/family also understands and agrees to the treatment plan including medications and  side effect profiles and also understand the risk with radiation while undergoing imaging studies.       The patient and the family/friends (after permission given by the patient to discuss) understand this and agree with the  admission plan.             Signed By:  Donato Schultz, MD           01/25/19   12:10 PM            Patient's emergency contacts:   Extended Emergency Contact Information   Primary Emergency Contact: [place] Phone: [phone]   Mobile Phone: [phone]   Relation: Parent   Secondary Emergency Contact: Foreman, [place] Phone: [phone]   Relation: Sister       Please note that this dictation was completed with Dragon, the computer voice recognition software.  Quite often unanticipated grammatical, syntax, homophones, and other interpretive errors are inadvertently transcribed  by the computer software.  Please disregard these errors.  Please excuse any errors that have escaped final proofreading.

## 2019-01-25 NOTE — Progress Notes (Signed)
Although suspicion is low since recent COVID-19 testing was negative but patient has persistent cough, hypoxemia, bilateral infiltrates and now has lymphopenia, will repeat COVID-19 testing again, placed on droplet plus precautions and check associated inflammatory markers.  If COVID-19 testing comes back negative overnight, it would be safe to transfer him to a Non-COVID floor

## 2019-01-25 NOTE — Progress Notes (Signed)
Patient was transported from ER to [place]; pt is r/o COVID; supervisor notified and patient transferred to [address].

## 2019-01-25 NOTE — Progress Notes (Signed)
 Care Management:    Transition of Care Plan:      RUR: not listed (patient is observation status)   Disposition: home   Transportation: He plans to drive self home as he drove self to hospital.   COVID-19 rule out pending (Negative on 01-21-19)    Recent inpatient admission 6-8 top 01-22-2019 for pneumonia.     Spoke with patient over telephone as he is currently being ruled out for COVID-19. Patient confirmed his address, phone number, and health care decision makers. Patient plans to drive himself home at time of discharge, as he drove self to hospital.     DME: none  ADLs: independent  Previous HH/SNF/rehab: none  Insurance: [employer] education officer  ER Contacts:  Primary Decision Maker/mother: Lue Pollen [phone] )  Secondary Decision Maker/biological mother: Rowland [place] [phone])  Supplemental Decision Maker/sister: Leonette Adonna Lue [phone])    Care Management Interventions  PCP Verified by CM: Yes(Dr. Lonni Hint)  Mode of Transport at Discharge: Self  Transition of Care Consult (CM Consult): Discharge Planning  Discharge Durable Medical Equipment: No  Physical Therapy Consult: No  Occupational Therapy Consult: No  Speech Therapy Consult: No  Current Support Network: Lives Alone  Confirm Follow Up Transport: [facility] Information Provided?: No  Discharge Location  Discharge Placement: Home    Ellouise FORBES Corns, MSW

## 2019-01-25 NOTE — Other (Signed)
Bedside shift change report given to paula duty rn (oncoming nurse) by natasha rn (offgoing nurse). Report included the following information SBAR and Kardex.

## 2019-01-25 NOTE — Progress Notes (Signed)
Patient was transported from ER to [place]; pt is r/o COVID; supervisor notified and patient transferred to [address].

## 2019-01-25 NOTE — Progress Notes (Signed)
[facility]  [id number]    Vitals   Pre   Post   Assessment   Pre   Post     Temp  Temp: 99 ??F (37.2 ??C) (01/25/19 1637)  99 LOC  A&Ox4, cooperative, follows commands A&Ox4, cooperative, follows commands   HR   88 83 Lungs   Diminished b/l  Diminished b.l   B/P  152/91 144/93 Cardiac   Regular, S1, S2  Regular, S1, S2   Resp   16 16 Skin   Pale,   R permacath Pale,   R permacath   Pain level  0 0 Edema  1-2+ b/l lower extremities   1-2+ b/l lower extremities   Orders:    Duration:   Start:    5784 End:    2009 Total:   3.5 hours   Dialyzer:   Dialyzer/Set Up Inspection: Revaclear(no supply of F180) (01/25/19 1637)   K Bath:   Dialysate K (mEq/L): 3.5 (01/25/19 1637)   Ca Bath:   Dialysate CA (mEq/L): 2.5 (01/25/19 1637)   Na/Bicarb:   Dialysate NA (mEq/L): 138 (01/25/19 1637)   Target Fluid Removal:   Goal/Amount of Fluid to Remove (mL): 3500 mL (01/25/19 1637)   Access     Type & Location:   RIJ CVC: Dressing CDI, dated 01/21/19. No s/s of infection. Both lumens aspirate & flush well. Running well at BFR 400.        Labs     Obtained/Reviewed   Critical Results Called   Date when labs were drawn-  Hgb-    HGB   Date Value Ref Range Status   01/25/2019 8.3 (L) 12.1 - 17.0 g/dL Final     K-    Potassium   Date Value Ref Range Status   01/25/2019 3.8 3.5 - 5.1 mmol/L Final     Ca-   Calcium   Date Value Ref Range Status   01/25/2019 7.8 (L) 8.5 - 10.1 MG/DL Final     Bun-   BUN   Date Value Ref Range Status   01/25/2019 31 (H) 6 - 20 MG/DL Final     Creat-   Creatinine   Date Value Ref Range Status   01/25/2019 7.70 (H) 0.70 - 1.30 MG/DL Final        Medications/ Blood Products Given     Name   Dose   Route and Time     Heparin 1:1000 3600 Units  CVC dwell at end of treatment             Blood Volume Processed (BVP):    77.7 L Net Fluid   Removed:  3500 mL   Comments   Time Out Done: Yes  Primary Nurse Rpt Pre: Jeralene Huff, RN  Primary Nurse Rpt Post: Jeralene Huff, RN   Pt Education: Ordered HD treatment  Care Plan: Infection Prevention  Tx Summary:     SBAR received from Primary RN. Pt had just arrived from ER to floor room, Pt A&Ox4, reviewed ordered dialysis treatment, pt in agreement. Physical assessment performed. Machine preparation and safety checks performed. Reviewed Consent with patient, pt signed & will place on file.   1637: Both lumens of permcath disinfected with Prevantics per policy.  Each lumen aspirated for blood return and flushed with Normal Saline per policy. Labs drawn per request/order. VSS. Dialysis Tx initiated.   1730: Pt resting quietly, tolerating tx well, no complaints of pain.  1830: Pt resting  quietly, tolerating tx well, no complaints of pain.  1930: Pt resting quietly, tolerating tx well, no complaints of pain.  2009: Tx ended. VSS. All possible blood returned to patient. Central line catheter flushed with normal saline per policy.  Ports disinfected with Prevantics per policy, lines disconnected, Heparin dwells instilled, and lines capped using aseptic technique. Bed locked and in the lowest position, call bell and belongings in reach. SBAR given to Primary, Therapist, sports. Patient is stable at time of my departure.    All Dialysis related medications have been reviewed.      Admiting Diagnosis: Acute hypoxic respiratory failure  Pt's previous clinic- [place] MWF  Consent signed - Informed Consent Verified: Yes (01/25/19 1637)  [facility] Consent - Signed and will file  Hepatitis Status- 01/18/19 HBsAG Negative, 01/18/19 HBsAB <10 Susceptible  Machine #- Machine Number: [id number] (01/25/19 1637)  Telemetry status- Medical  Pre-dialysis wt.-  N/A

## 2019-01-25 NOTE — ACP (Advance Care Planning) (Signed)
Advance Care Planning     Advance Care Planning Activator (Inpatient)  Conversation Note      Date of ACP Conversation: 01/25/19     Conversation Conducted with:   Patient with Decision Making Capacity    ACP Activator: Collier Flowers, MSW    Health Care Decision Maker:    Current Designated Health Care Decision Maker:   Primary Decision Maker: Zada Girt - Parent [phone]    Secondary Decision Maker: Kandace Parkins (biological mother) - Other Non-relative - [phone]    Supplemental (Other) Decision Maker: Yann, Biehn - Sister [phone]      Care Preferences    Ventilation:  "If you were in your present state of health and suddenly became very ill and were unable to breathe on your own, what would your preference be about the use of a ventilator (breathing machine) if it were available to you?"      If patient would desire the use of a ventilator (breathing machine), answer "yes", if not "no":yes    "If your health worsens and it becomes clear that your chance of recovery is unlikely, what would your preference be about the use of a ventilator (breathing machine) if it were available to you?"     Would the patient desire the use of a ventilator (breathing machine)?  YES      Resuscitation  "CPR works best to restart the heart when there is a sudden event, like a heart attack, in someone who is otherwise healthy. Unfortunately, CPR does not typically restart the heart for people who have serious health conditions or who are very sick."    "In the event your heart stopped as a result of an underlying serious health condition, would you want attempts to be made to restart your heart (answer "yes" for attempt to resuscitate) or would you prefer a natural death (answer "no" for do not attempt to resuscitate)?" yes      []  Yes  []  No   Educated Patient / Decision Maker regarding differences between Advance Directives and portable DNR orders.     Length of ACP Conversation in minutes:  10 minutes    Conversation Outcomes:  [x]  ACP discussion completed  []  Existing advance directive reviewed with patient; no changes to patient's previously recorded wishes     []  New Advance Directive completed   []  Portable Do Not Resuscitate prepared for Provider review and signature  []  POLST/POST/MOLST/MOST prepared for Provider review and signature      Follow-up plan:    []  Schedule follow-up conversation to continue planning  []  Referred individual to Provider for additional questions/concerns   [x]  Advised patient/agent/surrogate to review completed ACP document and update if needed with changes in condition, patient preferences or care setting     []  This note routed to one or more involved healthcare providers       Spoke with patient on the phone when he arrived to his room on the unit. Began discussing ACP. He had discussion with palliative on 01-22-2019 during his last admission. His Healthcare Decision Makers are as listed above.  Encouraged him to fill out the AMD during this admission. Patient stated he was not ready to do that at this time.     Collier Flowers, MSW

## 2019-01-25 NOTE — ED Provider Notes (Signed)
The history is provided by the patient.   Shortness of Breath   [name]. The average episode lasts 1 day. The problem occurs continuously.The current episode started 12 to 24 hours ago. The problem has been gradually worsening. Associated symptoms include wheezing and orthopnea. Pertinent negatives include no fever, no cough, no sputum production, no chest pain, no syncope, no vomiting and no rash. It is unknown what precipitated the problem. He has tried nothing for the symptoms. He has had prior hospitalizations (with recurrent effusions). Associated medical issues comments: ESRD on dialysis.        Past Medical History:   Diagnosis Date   ??? Anemia 01/21/2019   ??? ARF (acute renal failure) (HCC)     on HD   ??? BPH (benign prostatic hyperplasia)    ??? Cancer ([place])     left testicle- stage III, nonseminomatous germ cell tumor, s/p orchiectomy & BEP x 3 cycles   ??? Chronic fatigue January 1987   ??? Chronic kidney disease     renal failure, HD   ??? Essential tremor    ??? GERD (gastroesophageal reflux disease)    ??? 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 Hyponatremia 01/21/2019   ??? Hypothyroid    ??? 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 Lumbar radiculopathy 08/10/2012    S/p steroid injection by Dr Laverta Underwood 08/01/12    ??? Normal cardiac stress test 09/05/11   ??? Pleural effusion, bilateral 01/21/2019   ??? Pulmonary edema 01/21/2019   ??? Sleep disturbances     acting out in his sleep   ??? Thrombocytopenia ([place]) 01/21/2019       Past Surgical History:   Procedure Laterality Date   ??? HX CHOLECYSTECTOMY  12/05/2011    abnormal HIDA scan (EF < 2%)   ??? HX COLONOSCOPY  07/12/10    hyperplastic polyp, repeat 10 yrs   ??? HX COLONOSCOPY  02/23/15    normal   ??? HX CYST REMOVAL Right 01/06/2016    3rd toe ganglion cyst removal - podiatry (Dr Raliegh Ip)   ??? HX GI  09/09/11    EGD- normal   ??? HX HEENT Left 06/2012    tonsilar bx: benign tissue (Dr Buddy Duty)   ??? HX ORCHIECTOMY Left 2002   ??? 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 HX SHOULDER ARTHROSCOPY Right 08/13/2018         Family History:   Problem Relation Age of Onset   ??? No Known Problems Mother    ??? Parkinsonism Father    ??? Arthritis-osteo Father         scoliosis   ??? No Known Problems Brother    ??? No Known Problems Brother    ??? No Known Problems Sister    ??? Cancer Maternal Grandfather         prostate   ??? Cancer Paternal Grandfather         prostate       Social History     Socioeconomic History   ??? Marital status: SINGLE     Spouse name: Not on file   ??? Number of children: Not on file   ??? Years of education: Not on file   ??? Highest education level: Not on file   Occupational History   ??? Not on file   Social Needs   ??? Financial resource strain: Not  on file   ??? Food insecurity     Worry: Not on file     Inability: Not on file   ??? Transportation needs     Medical: Not on file     Non-medical: Not on file   Tobacco Use   ??? Smoking status: Never Smoker   ??? Smokeless tobacco: Never Used   Substance and Sexual Activity   ??? Alcohol use: Yes     Comment: rarely   ??? Drug use: No   ??? Sexual activity: Not Currently     Partners: Female   Lifestyle   ??? Physical activity     Days per week: Not on file     Minutes per session: Not on file   ??? Stress: Not on file   Relationships   ??? Social Product manager on phone: Not on file     Gets together: Not on file     Attends religious service: Not on file     Active member of club or organization: Not on file     Attends meetings of clubs or organizations: Not on file     Relationship status: Not on file   ??? Intimate partner violence     Fear of current or ex partner: Not on file     Emotionally abused: Not on file     Physically abused: Not on file     Forced sexual activity: Not on file   Other Topics Concern   ??? Not on file   Social History Narrative   ??? Not on file         ALLERGIES: Latex    Review of Systems   Constitutional: Negative for fever.    Respiratory: Positive for shortness of breath and wheezing. Negative for cough and sputum production.    Cardiovascular: Positive for orthopnea. Negative for chest pain and syncope.   Gastrointestinal: Negative for vomiting.   Skin: Negative for rash.   All other systems reviewed and are negative.      Vitals:    01/25/19 0853 01/25/19 0900   BP: (!) 148/94 140/90   Pulse: 99 97   Resp: 20 23   Temp: 98.4 ??F (36.9 ??C)    SpO2: 91% (!) 89%            Physical Exam  Vitals signs and nursing note reviewed.   Constitutional:       General: He is not in acute distress.     Appearance: He is well-developed.   HENT:      Head: Normocephalic and atraumatic.   Eyes:      Conjunctiva/sclera: Conjunctivae normal.   Neck:      Musculoskeletal: Neck supple.      Trachea: No tracheal deviation.   Cardiovascular:      Rate and Rhythm: Normal rate and regular rhythm.   Pulmonary:      Effort: Pulmonary effort is normal. No respiratory distress.      Breath sounds: Wheezing (faint bilateral expiratory) present. No decreased breath sounds.   Chest:      Chest wall: Edema (bilateral leg) present.   Abdominal:      General: There is no distension.   Musculoskeletal: Normal range of motion.         General: No deformity.   Skin:     General: Skin is warm and dry.   Neurological:      Mental Status: He is alert.  Cranial Nerves: No cranial nerve deficit.   Psychiatric:         Behavior: Behavior normal.          MDM     53 year old male with history of end-stage renal disease on dialysis last received Wednesday presents with ongoing shortness of breath worse with lying flat that started last night.  This is a recurrent issue for him.  He has had a history of pleural effusions that required drainage bilaterally but they are not evident on imaging today.  He had recent COVID rule out as well as a CT angiogram for PE rule out during his recent admission.  He is recurrently hypoxemic today requiring 2 L of oxygen by  nasal cannula.  He feels better after oxygen administration.  VQ scan ordered for repeat PE evaluation but may need home oxygen therapy set up.  He is due for dialysis later today at 4 PM.    Procedures    EKG 0904: Rate 96, normal sinus rhythm, prolonged QT interval. No significant change from last tracing.     Hospitalist Perfect Serve for Admission  10:08 AM    ED Room Number: ER08/08  Patient Name and age:  John Friedman 53 y.o.  male  Working Diagnosis:   1. Acute respiratory failure with hypoxemia ([place])        COVID-19 Suspicion:  no    Code Status:  Full Code  Readmission: yes  Isolation Requirements:  no  Recommended Level of Care:  telemetry  Department:[facility] ED - [phone]

## 2019-01-25 NOTE — Progress Notes (Addendum)
Although suspicion is low since recent COVID-19 testing was negative but patient has persistent cough, hypoxemia, bilateral infiltrates and now has lymphopenia, will repeat COVID-19 testing again, placed on droplet plus precautions and check associated inflammatory markers.  If COVID-19 testing comes back negative overnight, it would be safe to transfer him to a Non-COVID floor

## 2019-01-25 NOTE — Progress Notes (Signed)
Care Management:    Transition of Care Plan:     ?? RUR: not listed (patient is observation status)  ?? Disposition: home  ?? Transportation: He plans to drive self home as he drove self to hospital.  ?? COVID-19 rule out pending (Negative on 01-21-19)    Recent inpatient admission 6-8 top 01-22-2019 for pneumonia.     Spoke with patient over telephone as he is currently being ruled out for COVID-19. Patient confirmed his address, phone number, and health care decision makers. Patient plans to drive himself home at time of discharge, as he drove self to hospital.     DME: none  ADLs: independent  Previous HH/SNF/rehab: none  Insurance: [employer]line [employer]  ER Contacts:  Primary Decision Maker/mother: Zada Girt [phone] )  Secondary Decision Maker/biological mother: Kandace Parkins [phone])  Supplemental Decision Maker/sister: Enedina Finner [phone])  ??  Care Management Interventions  PCP Verified by CM: Yes(Dr. Renaee Munda)  Mode of Transport at Discharge: Self  Transition of Care Consult (CM Consult): Discharge Planning  Discharge Durable Medical Equipment: No  Physical Therapy Consult: No  Occupational Therapy Consult: No  Speech Therapy Consult: No  Current Support Network: Lives Alone  Confirm Follow Up Transport: Geophysicist/field seismologist Information Provided?: No  Discharge Location  Discharge Placement: [place], MSW

## 2019-01-25 NOTE — Consults (Signed)
[address], [address]  ???? [place] [address]  Phone: [phone]??????[phone]?? ??    NEPHROLOGY CONSULT NOTE     Patient: John Friedman MRN: [id number]  PCP: Antony Haste, MD   DOB:     08-08-1966  Age:   53 y.o.  Sex:  male      Referring physician: Dennison Mascot, MD  Reason for consultation: 53 y.o. male with Hypoxemia [I14.43] complicated by AKI   Admission Date: 01/25/2019  8:44 AM  LOS: 0 days      ASSESSMENT and PLAN :   AKI:  - unclear cause, progressive CKD vs obstructive uropathy  - HD MWF at [facility]  - last HD was wednesday, TW was 86 kg  - will Challenge the TW   - plan additional UF tomorrow   - dialysis today  ??  Volume overload:  - HD today and plan UF tomorrow   ??  Hypoxic Resp failure   - CTA pending and repeat ECHo pending as well as Cards consult   ??  Anemia of CKD:  - Iron studies in AM  - ESA w/ HD while here  ??  Obstructive uropathy:  - foley in place  - f/u w/ urology as an [place] discussed with:  Pt and nurse and Hospitalist   [facility] [place] notified      Thank you for consulting [facility] in the care of your patient.      Subjective:   HPI: John Friedman is a 53 y.o. Caucasian male who has been admitted to the hospital for SOB. He is a new start HD from Obstructive Uropathy as well as chronic GN . All his serological work up in HDF was negative. He was here on 9th with similar complain. He did his HD on weneday and did fine at out Pt hd. He comes with SOB and requring oxygen.    He has no other complains. Denies any fever       Past Medical Hx:   Past Medical History:   Diagnosis Date   ??? Anemia 01/21/2019   ??? ARF (acute renal failure) (HCC)     on HD   ??? BPH (benign prostatic hyperplasia)    ??? Cancer ([place])     left testicle- stage III, nonseminomatous germ cell tumor, s/p orchiectomy & BEP x 3 cycles   ??? Chronic fatigue January 1987   ??? Chronic kidney disease     renal failure, HD   ??? Essential tremor     ??? GERD (gastroesophageal reflux disease)    ??? 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 Hyponatremia 01/21/2019   ??? Hypothyroid    ??? 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 Lumbar radiculopathy 08/10/2012    S/p steroid injection by Dr Laverta [place] 08/01/12    ??? Normal cardiac stress test 09/05/11   ??? Pleural effusion, bilateral 01/21/2019   ??? Pulmonary edema 01/21/2019   ??? Sleep disturbances     acting out in his sleep   ??? Thrombocytopenia ([place]) 01/21/2019        Past Surgical Hx:     Past Surgical History:   Procedure Laterality Date   ??? HX CHOLECYSTECTOMY  12/05/2011    abnormal HIDA scan (EF < 2%)   ??? HX COLONOSCOPY  07/12/10    hyperplastic polyp, repeat 10 yrs   ???  HX COLONOSCOPY  02/23/15    normal   ??? HX CYST REMOVAL Right 01/06/2016    3rd toe ganglion cyst removal - podiatry (Dr Raliegh Ip)   ??? HX GI  09/09/11    EGD- normal   ??? HX HEENT Left 06/2012    tonsilar bx: benign tissue (Dr Buddy Duty)   ??? HX ORCHIECTOMY Left 2002   ??? 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 HX SHOULDER ARTHROSCOPY Right 08/13/2018         Allergies   Allergen Reactions   ??? Latex Other (comments)     redness       Social Hx:  reports that he has never smoked. He has never used smokeless tobacco. He reports current alcohol use. He reports that he does not use drugs.     Family History   Problem Relation Age of Onset   ??? No Known Problems Mother    ??? Parkinsonism Father    ??? Arthritis-osteo Father         scoliosis   ??? No Known Problems Brother    ??? No Known Problems Brother    ??? No Known Problems Sister    ??? Cancer Maternal Grandfather         prostate   ??? Cancer Paternal Grandfather         prostate       Review of Systems:  A thorough twelve point review of system was performed today. Pertinent positives and negatives are mentioned in the HPI. The reminder of the ROS is negative and noncontributory.     Objective:    Vitals:    Vitals:    01/25/19 0853 01/25/19 0900   BP: (!) 148/94 140/90   Pulse: 99 97    Resp: 20 23   Temp: 98.4 ??F (36.9 ??C)    SpO2: 91% (!) 89%     I&O's:  No intake/output data recorded.  Visit Vitals  BP 140/90   Pulse 97   Temp 98.4 ??F (36.9 ??C)   Resp 23   SpO2 (!) 89%       Physical Exam:  General:  No apparent Distress  HEENT: PERRL,  No Pallor , No Icterus  Neck: Supple,no mass palpable  Lungs : CTA  CVS: RRR, S1 S2 normal, No murmur   Abdomen: Soft, NT, BS +  Extremities: Edema  Skin: No rash or lesions.  MS: No joint swelling, erythema, warmth  Neurologic: non focal, AAO x 3  Psych: normal affect    Laboratory Results:    Recent Labs     01/25/19  0856   NA 133*   K 3.8   CL 96*   CO2 25   GLU 92   BUN 31*   CREA 7.70*   CA 7.8*   ALB 3.5   ALT 32     Recent Labs     01/25/19  0856   WBC 9.2   HGB 8.3*   HCT 25.8*   PLT 182     No results found for: SDES  Lab Results   Component Value Date/Time    Culture result: NO GROWTH 3 DAYS 01/22/2019 11:45 AM    Culture result: NO GROWTH 4 DAYS 01/21/2019 10:53 AM     Recent Results (from the past 24 hour(s))   EKG, 12 LEAD, INITIAL    Collection Time: 01/25/19  8:54 AM   Result Value Ref Range  Ventricular Rate 96 BPM    Atrial Rate 96 BPM    P-R Interval 184 ms    QRS Duration 86 ms    Q-T Interval 404 ms    QTC Calculation (Bezet) 510 ms    Calculated P Axis 60 degrees    Calculated R Axis 59 degrees    Calculated T Axis 64 degrees    Diagnosis       ** Poor data quality, interpretation may be adversely affected  Normal sinus rhythm  Prolonged QT  When compared with ECG of 21-Jan-2019 08:31,  No significant change was found     TYPE & SCREEN    Collection Time: 01/25/19  8:56 AM   Result Value Ref Range    Crossmatch Expiration 01/28/2019     ABO/Rh(D) Jenetta Downer POSITIVE     Antibody screen NEG    CBC WITH AUTOMATED DIFF    Collection Time: 01/25/19  8:56 AM   Result Value Ref Range    WBC 9.2 4.1 - 11.1 K/uL    RBC 2.72 (L) 4.10 - 5.70 M/uL    HGB 8.3 (L) 12.1 - 17.0 g/dL    HCT 25.8 (L) 36.6 - 50.3 %    MCV 94.9 80.0 - 99.0 FL     MCH 30.5 26.0 - 34.0 PG    MCHC 32.2 30.0 - 36.5 g/dL    RDW 13.0 11.5 - 14.5 %    PLATELET 182 150 - 400 K/uL    MPV 10.9 8.9 - 12.9 FL    NRBC 0.0 0 PER 100 WBC    ABSOLUTE NRBC 0.00 0.00 - 0.01 K/uL    NEUTROPHILS 84 (H) 32 - 75 %    LYMPHOCYTES 5 (L) 12 - 49 %    MONOCYTES 9 5 - 13 %    EOSINOPHILS 1 0 - 7 %    BASOPHILS 0 0 - 1 %    IMMATURE GRANULOCYTES 1 (H) 0.0 - 0.5 %    ABS. NEUTROPHILS 7.7 1.8 - 8.0 K/UL    ABS. LYMPHOCYTES 0.5 (L) 0.8 - 3.5 K/UL    ABS. MONOCYTES 0.8 0.0 - 1.0 K/UL    ABS. EOSINOPHILS 0.1 0.0 - 0.4 K/UL    ABS. BASOPHILS 0.0 0.0 - 0.1 K/UL    ABS. IMM. GRANS. 0.1 (H) 0.00 - 0.04 K/UL    DF SMEAR SCANNED      RBC COMMENTS ANISOCYTOSIS  2+        RBC COMMENTS NORMOCYTIC, NORMOCHROMIC     METABOLIC PANEL, COMPREHENSIVE    Collection Time: 01/25/19  8:56 AM   Result Value Ref Range    Sodium 133 (L) 136 - 145 mmol/L    Potassium 3.8 3.5 - 5.1 mmol/L    Chloride 96 (L) 97 - 108 mmol/L    CO2 25 21 - 32 mmol/L    Anion gap 12 5 - 15 mmol/L    Glucose 92 65 - 100 mg/dL    BUN 31 (H) 6 - 20 MG/DL    Creatinine 7.70 (H) 0.70 - 1.30 MG/DL    BUN/Creatinine ratio 4 (L) 12 - 20      GFR est AA 9 (L) >60 ml/min/1.59m    GFR est non-AA 7 (L) >60 ml/min/1.730m   Calcium 7.8 (L) 8.5 - 10.1 MG/DL    Bilirubin, total 0.6 0.2 - 1.0 MG/DL    ALT (SGPT) 32 12 - 78 U/L    AST (SGOT) 23 15 - 37 U/L  Alk. phosphatase 87 45 - 117 U/L    Protein, total 6.6 6.4 - 8.2 g/dL    Albumin 3.5 3.5 - 5.0 g/dL    Globulin 3.1 2.0 - 4.0 g/dL    A-G Ratio 1.1 1.1 - 2.2     SAMPLES BEING HELD    Collection Time: 01/25/19  8:56 AM   Result Value Ref Range    SAMPLES BEING HELD 1BLU     COMMENT        Add-on orders for these samples will be processed based on acceptable specimen integrity and analyte stability, which may vary by analyte.   NT-PRO BNP    Collection Time: 01/25/19  8:56 AM   Result Value Ref Range    NT pro-BNP 6,472 (H) <125 PG/ML   TROPONIN I    Collection Time: 01/25/19  8:56 AM   Result Value Ref Range     Troponin-I, Qt. <0.05 <0.05 ng/mL         Urine dipstick:   Lab Results   Component Value Date/Time    Color YELLOW/STRAW 08/16/2014 08:53 PM    Appearance CLEAR 08/16/2014 08:53 PM    Specific gravity 1.005 08/16/2014 08:53 PM    pH (UA) 7.0 08/16/2014 08:53 PM    Protein NEGATIVE  08/16/2014 08:53 PM    Glucose NEGATIVE  08/16/2014 08:53 PM    Ketone 15 (A) 08/16/2014 08:53 PM    Bilirubin NEGATIVE  08/16/2014 08:53 PM    Urobilinogen 0.2 08/16/2014 08:53 PM    Nitrites NEGATIVE  08/16/2014 08:53 PM    Leukocyte Esterase NEGATIVE  08/16/2014 08:53 PM    Epithelial cells FEW 08/16/2014 08:53 PM    Bacteria NEGATIVE  08/16/2014 08:53 PM    WBC 0-4 08/16/2014 08:53 PM    RBC 0-5 08/16/2014 08:53 PM       I have reviewed the following:   All pertinent labs, microbiology data, radiology imaging for my assessment     Medications list Personally Reviewed   '[x]'       Yes     '[]'                No       Medications:  Prior to Admission medications    Medication Sig Start Date End Date Taking? Authorizing Provider   clomiPRAMINE (ANAFRANIL) 50 mg capsule Take 50 mg by mouth nightly.    Provider, Historical   tamsulosin (Flomax) 0.4 mg capsule Take 0.4 mg by mouth daily.    Provider, Historical   liothyronine (CYTOMEL) 5 mcg tablet Take 5 mcg by mouth daily.    Provider, Historical   OTHER Supplemental drink - phosphatidylcholine with omega 3 and 6 and dextrose - to help with tremors.    Provider, Historical   pregabalin (Lyrica) 300 mg capsule Take 300 mg by mouth nightly. For "sleep disturbances" - pt has had injuries from falling out of bed during sleep    Provider, Historical   armodafinil (NUVIGIL) 200 mg tab Take 1 Tab by mouth daily as needed.    Provider, Historical   propranolol (INDERAL) 20 mg tablet Take 20 mg by mouth daily. For tremors    Provider, Historical   clonazepam (KLONOPIN) 1 mg tablet Take 1 mg by mouth two (2) times a day.    Provider, Historical         Thank you for allowing [place] to participate in the care of this patient.   We will follow patient. Please don???t hesitate to  call with any questions    Mechele Dawley, MD  [facility]   [address], [place] [address]  Phone - [phone]   Fax - [phone]  [email or web address]

## 2019-01-25 NOTE — H&P (Signed)
Hospitalist History and Physical  Donato Schultz, MD  Answering service: [phone] OR [phone] from in house phone        Date of Service:  01/25/2019  NAME:  John Friedman  DOB:  03-May-1966  MRN:  [id number]  Primary Care Provider: Antony Haste, MD    Chief Complaint:   Chief Complaint   Patient presents with   ??? Shortness of Breath       History of Present Illness:     John Friedman is a 53 y.o. 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  Patient is awake, alert and oriented, able answer my question and follow my request.  Data also obtained from the ED staff.  As per collective reports, patient has been feeling shortness of breath for last few days, reports that he was expecting shortness of breath to get better with time and dialysis but he was feeling that his shortness of breath is not improving.  His last session of dialysis was on Wednesday and has been having progressions of breath since then.  His shortness of breath is worsened with lying flat last night and it has been a recurrent issue for him.  He woke up this morning shortness of breath and was gasping for air.  He has history of vertebral effusion which required drainage but x-ray today does not show any acute pathology.  Patient is feeling as if he is drowning in water and is having dry cough with crackles on exam.  He came to ED for evaluation, he was hypoxemic desaturating at 89% on room air, requiring 2 L via nasal cannula.  He was tachypneic as well.  Blood work reveals no acute changes, BMP shows elevated creatinine it is expected.  He is feeling better on supplemental oxygen but very anxious about his lack of improvement despite dialysis.  I called and discussed with  patient's nephrologist Dr. Iona Beard who recommends to get repeat echocardiogram and cardiology consult.  He is also agreeable to have CT angio chest done to rule out underlying PE.  Patient denies any fevers, cough, nausea common, headache, blurring of vision, abdominal pain, chest pain, back pain, diarrhea or constipation, fall or trauma or any recent sick contacts.  His recent cover testing was negative.    Chart reviewed at length.  Review of Systems:  Pertinent positives noted in HPI. All other systems were reviewed and are negative.    Past Medical and surgical history:   Past Medical History:   Diagnosis Date   ??? Anemia 01/21/2019   ??? ARF (acute renal failure) (HCC)     on HD   ??? BPH (benign prostatic hyperplasia)    ??? Cancer ([place])     left testicle- stage III, nonseminomatous germ cell tumor, s/p orchiectomy & BEP x 3 cycles   ??? Chronic fatigue January 1987   ??? Chronic kidney disease     renal failure, HD   ??? Essential tremor    ??? GERD (gastroesophageal reflux disease)    ??? 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 Hyponatremia 01/21/2019   ??? Hypothyroid    ??? 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 Lumbar radiculopathy 08/10/2012    S/p steroid injection by Dr Laverta [place] 08/01/12    ???  Normal cardiac stress test 09/05/11   ??? Pleural effusion, bilateral 01/21/2019   ??? Pulmonary edema 01/21/2019   ??? Sleep disturbances     acting out in his sleep   ??? Thrombocytopenia ([place]) 01/21/2019      Past Surgical History:   Procedure Laterality Date   ??? HX CHOLECYSTECTOMY  12/05/2011    abnormal HIDA scan (EF < 2%)   ??? HX COLONOSCOPY  07/12/10    hyperplastic polyp, repeat 10 yrs   ??? HX COLONOSCOPY  02/23/15    normal   ??? HX CYST REMOVAL Right 01/06/2016    3rd toe ganglion cyst removal - podiatry (Dr Raliegh Ip)   ??? HX GI  09/09/11    EGD- normal   ??? HX HEENT Left 06/2012    tonsilar bx: benign tissue (Dr Buddy Duty)   ??? HX ORCHIECTOMY Left 2002   ??? HX ORTHOPAEDIC      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25    ??? HX SHOULDER ARTHROSCOPY Right 08/13/2018       Home medications:  Prior to Admission medications    Medication Sig Start Date End Date Taking? Authorizing Provider   clomiPRAMINE (ANAFRANIL) 50 mg capsule Take 50 mg by mouth nightly.    Provider, Historical   tamsulosin (Flomax) 0.4 mg capsule Take 0.4 mg by mouth daily.    Provider, Historical   liothyronine (CYTOMEL) 5 mcg tablet Take 5 mcg by mouth daily.    Provider, Historical   OTHER Supplemental drink - phosphatidylcholine with omega 3 and 6 and dextrose - to help with tremors.    Provider, Historical   pregabalin (Lyrica) 300 mg capsule Take 300 mg by mouth nightly. For "sleep disturbances" - pt has had injuries from falling out of bed during sleep    Provider, Historical   armodafinil (NUVIGIL) 200 mg tab Take 1 Tab by mouth daily as needed.    Provider, Historical   propranolol (INDERAL) 20 mg tablet Take 20 mg by mouth daily. For tremors    Provider, Historical   clonazepam (KLONOPIN) 1 mg tablet Take 1 mg by mouth two (2) times a day.    Provider, Historical       Allergies:  Allergies   Allergen Reactions   ??? Latex Other (comments)     redness       Family history:   Family History   Problem Relation Age of Onset   ??? No Known Problems Mother    ??? Parkinsonism Father    ??? Arthritis-osteo Father         scoliosis   ??? No Known Problems Brother    ??? No Known Problems Brother    ??? No Known Problems Sister    ??? Cancer Maternal Grandfather         prostate   ??? Cancer Paternal Grandfather         prostate        SOCIAL HISTORY:  Patient resides at Home.  Patient ambulates without any device.   Smoking history: reports that he has never smoked. He has never used smokeless tobacco.  Drug History:  reports no history of drug use.  Alcohol history:  reports current alcohol use.    Objective:       Physical Exam:   Visit Vitals  BP 140/90   Pulse 97   Temp 98.4 ??F (36.9 ??C)   Resp 23   SpO2 (!) 89%      General appearance: alert, cooperative, severe distress,  appears older than stated age, anxious  Head: Normocephalic, without obvious abnormality, atraumatic  Eyes: Conjunctival pallor is noted, no scleral icterus  Throat: Oral mucosa is moist, no oropharyngeal exudates  Lungs: Coarse crackles at the bases, no wheezing, mild respiratory distress normal sinus rhythm, no murmur gallops or rubs  Heart:   Abdomen: Soft, nontender, no guarding, no rebound  Extremities: 1-2+ pitting edema bilateral lower extremities  Pulses: 2+ and symmetric  Skin: Skin color, texture, turgor normal. No rashes or lesions  Neurologic: Grossly normal    ECG:  normal sinus rhythm     Laboratory and other diagnostic Data Review:   All diagnostic labs and studies have been reviewed.    Xr Chest Pa Lat    Result Date: 01/25/2019  Indication:  shortness of breath, history of pleural effusions Exam: PA and lateral views of the chest. Direct comparison is made to prior CXR dated January 24, 2019. Findings: Cardiomediastinal silhouette is stable. Dual-lumen central venous catheter is unchanged in position. There has been interval improvement of pulmonary vascular congestive changes. There is persistent bibasilar patchy airspace disease. No pleural fluid is visualized. There is no pneumothorax.     IMPRESSION: Improved bilateral aeration, as described above.     Xr Chest Pa Lat    Result Date: 01/21/2019  Indication:  SOB. Orthopnea. Exam: PA and lateral views of the chest. Direct comparison is made to prior CXR dated January 2017. Findings: Cardiomediastinal silhouette is partially obscured by patchy bibasilar airspace disease and small bilateral pleural effusions. There is also pulmonary vascular prominence and bilateral interstitial edema. Central venous catheter extends to the right atrium. There is no pneumothorax.     IMPRESSION: Extensive pulmonary vascular congestive changes, as described above.     Cta Chest W Or W Wo Cont     Result Date: 01/21/2019  INDICATION: Shortness of breath and orthopnea. Recent hospitalization. End-stage renal disease. COMPARISON:PA and lateral chest radiograph from earlier today TECHNIQUE:  Routine noncontrast imaging the chest was performed for localization purposes. Then, following the uneventful intravenous administration of 75 cc [id number], thin helical axial images were obtained through the chest. 3D image postprocessing was performed. CT dose reduction was achieved through use of a standardized protocol tailored for this examination and automatic exposure control for dose modulation. Adaptive statistical iterative reconstruction (ASIR) was utilized. FINDINGS: CHEST WALL: No chest wall mass or axillary lymphadenopathy. THYROID: No nodule. MEDIASTINUM: No mass or lymphadenopathy. Right internal jugular tunneled dialysis catheter terminates at the cavoatrial junction. HILA: No mass or lymphadenopathy. THORACIC AORTA: No dissection or aneurysm. PULMONARY ARTERIES: Main pulmonary artery is normal in caliber. No evidence of acute pulmonary emboli. TRACHEA/BRONCHI: Patent. ESOPHAGUS: No wall thickening or dilatation. HEART: Normal in size. PLEURA: Moderately large bilateral pleural effusions. LUNGS: Moderately severe pulmonary interstitial edema pattern. Consolidation in the right middle lobe and bilateral lower lobes. Nodular airspace disease in the upper lobes bilaterally. INCIDENTALLY IMAGED UPPER ABDOMEN: No focal abnormality. BONES: No destructive bone lesion. ADDITIONAL COMMENTS: N/A     IMPRESSION: No evidence of acute pulmonary embolus. Moderately large bilateral pleural effusions. Moderately severe pulmonary edema pattern. Bilateral nodular airspace disease and bilateral consolidation may indicate superimposed pneumonia; radiographic follow-up is recommended to assure resolution.    Xr Chest Port    Result Date: 01/24/2019  EXAM: XR CHEST PORT INDICATION: s/p left thoracentesis. Patient in IR  holding COMPARISON: Chest x-ray 01/22/2019. FINDINGS: A portable AP radiograph of the chest was obtained at 13:06 hours. Right hemodialysis catheter projects  in stable and expected position with the tip in the region of the atriocaval junction.. The lungs show pulmonary vascular congestion with diffuse interstitial edema. Left pleural effusion has essentially resolved with no pneumothorax. There is been interval accumulation of small right pleural effusion.. The cardiac and mediastinal contours and pulmonary vascularity are normal.  The bones and soft tissues are grossly within normal limits.     IMPRESSION: 1. No pneumothorax status post left thoracentesis with essentially resolved left pleural effusion. 2. Small right pleural effusion is a cannulated since prior exam. 3. Congestive failure and interstitial pulmonary edema.    Xr Chest Port    Result Date: 01/22/2019  EXAM: XR CHEST PORT INDICATION: post thoracentesis, evaluate effusion COMPARISON: 01/21/2019 FINDINGS: A portable AP radiograph of the chest was obtained at 1548 hours. The left pleural effusion is diminished in size with small residual. Consolidative opacities in the left lower lung are again shown. There is also an appearance of bilateral perihilar pulmonary opacification consistent with mild pulmonary edema. No pneumothorax is shown. Cardiac and mediastinal contours are stable. Hilar contours are obscured. Double lumen right IJ line is again shown terminating at the cavoatrial junction.     IMPRESSION: 1. Diminished left pleural effusion with small residual. No pneumothorax. 2. Left basilar consolidation again shown. 3. Borderline-mild appearance of pulmonary edema.    US Thoracentesis Lt Ndl W Image    Result Date: 01/24/2019  PROCEDURE: Thoracentesis INDICATION: Pleural effusion OPERATING PHYSICIAN: Carlus Pavlov, M.D. ESTIMATED BLOOD LOSS: Less than 5 cc. SPECIMENS REMOVED: 1050 mL of clear pleural fluid. FLOURO TIME: None COMPLICATIONS:  None immediate. Procedure and findings: The risks and benefits of the procedure were discussed with the patient. Written consent was obtained. Preliminary ultrasound imaging of the left chest demonstrated a large pleural effusion. An appropriate site for thoracentesis was marked on the skin. The patient was prepped and draped in sterile fashion. 1% lidocaine was injected locally. A dermatotomy was made. A thoracentesis catheter was then inserted into the pleural space using trocar technique. Approximately 1050 ml of yellow fluid was obtained. The catheter was then removed. The patient tolerated the procedure well. There were no immediate complications.     IMPRESSION: Successful ultrasound guided left  thoracentesis yielding approximately 1050 ml of fluid.     US Thoracentesis Rt Ndl W Image    Result Date: 01/22/2019  CLINICAL HISTORY: Moderate right pleural effusion. COMPARISON: None. PROCEDURE: Ultrasound-guided right thoracentesis with 8 French pleural pigtail catheter placement and removal. OPERATORS: Marilynn Rail, MD. ANESTHESIA: buffered lidocaine local anesthetic. TECHNIQUE AND FINDINGS: The patient's relevant allergies, medications, laboratory results, and history were reviewed. The patient was identified by name and date of birth. The procedure, benefits, risks, and alternatives (including not having the procedure) were discussed. All of the patient's questions were answered. Informed verbal and written consent was obtained. The patient was positioned in a seated position and kyphosis was exaggerated by leaning forward. Targeted sonography of the right posterolateral chest was performed. The right pleural effusion was identified under ultrasound guidance and the overlying skin site was marked. Focused sonography of the left posterolateral chest showed at least a moderate left effusion, with less volume than on the right. A time-out procedure using two patient  identifiers was performed and laterality confirmed. The skin was prepped and draped using usual sterile technique. Subcutaneous and deep tissues were anesthetized with local anesthetic. A small skin nick was made. A 8 French pigtail catheter was inserted into the right pleural space. 120 cc  of clear yellow pleural fluid was aspirated and sent to pathology for analysis as ordered. A total of 1500 cc was aspirated for therapeutic thoracentesis, and the catheter was removed. There were no immediate complications. The patient tolerated the procedure well.     IMPRESSION: Uncomplicated ultrasound-guided right thoracentesis with 8 French pigtail catheter placement and removal.        Patient Vitals for the past 12 hrs:   Temp Pulse Resp BP SpO2   01/25/19 0900 ??? 97 23 140/90 (!) 89 %   01/25/19 0853 98.4 ??F (36.9 ??C) 99 20 (!) 148/94 91 %       Recent Labs     01/25/19  0856   WBC 9.2   HGB 8.3*   HCT 25.8*   PLT 182     Recent Labs     01/25/19  0856   NA 133*   K 3.8   CL 96*   CO2 25   BUN 31*   CREA 7.70*   GLU 92   CA 7.8*     Recent Labs     01/25/19  0856   ALT 32   AP 87   TBILI 0.6   TP 6.6   ALB 3.5   GLOB 3.1     No results for input(s): INR, PTP, APTT, INREXT in the last 72 hours.   No results for input(s): FE, TIBC, PSAT, FERR in the last 72 hours.   No results found for: FOL, RBCF   No results for input(s): PH, PCO2, PO2 in the last 72 hours.  Recent Labs     01/25/19  0856   TROIQ <0.05     Lab Results   Component Value Date/Time    Cholesterol, total 184 04/09/2018 07:25 AM    HDL Cholesterol 52 04/09/2018 07:25 AM    LDL, calculated 120 (H) 04/09/2018 07:25 AM    Triglyceride 59 04/09/2018 07:25 AM     Lab Results   Component Value Date/Time    Glucose (POC) 87 06/26/2013 02:54 AM     Lab Results   Component Value Date/Time    Color YELLOW/STRAW 08/16/2014 08:53 PM    Appearance CLEAR 08/16/2014 08:53 PM    Specific gravity 1.005 08/16/2014 08:53 PM    pH (UA) 7.0 08/16/2014 08:53 PM     Protein NEGATIVE  08/16/2014 08:53 PM    Glucose NEGATIVE  08/16/2014 08:53 PM    Ketone 15 (A) 08/16/2014 08:53 PM    Bilirubin NEGATIVE  08/16/2014 08:53 PM    Urobilinogen 0.2 08/16/2014 08:53 PM    Nitrites NEGATIVE  08/16/2014 08:53 PM    Leukocyte Esterase NEGATIVE  08/16/2014 08:53 PM    Epithelial cells FEW 08/16/2014 08:53 PM    Bacteria NEGATIVE  08/16/2014 08:53 PM    WBC 0-4 08/16/2014 08:53 PM    RBC 0-5 08/16/2014 08:53 PM       Assessment:   Given the patient's current clinical presentation, I have a high level of concern for decompensation if discharged from the emergency department.  Complex decision making was performed, which includes reviewing the patient's available past medical records, laboratory results, and x-ray films.        My assessment of this patient's clinical condition and my plan of care is as follows.      Active Problems:    Hypoxemia (01/25/2019)        Plan:     ???Acute hypoxemic respiratory failure, likely secondary volume overload  ???  Volume overload with pulmonary edema???chest x-ray is better than previous  O2 support, wean down O2 as needed, will do 6-minute walk test after dialysis is done over the morning  Discussed with Dr. Karna Dupes from nephrology, okay to do CT angio with PE protocol  Echocardiogram done on 01/22/2019 showed small pericardial effusion, will repeat limited echocardiogram to see its progress.  Nephrology recommends a cardiology consult for completion of care given patient's recurrent admissions and unresolved symptoms despite being on dialysis.    ???Thrombocytopenia???now resolved  Avoid volume overload    ???ESRD on HD  Patient is on Monday Wednesday Friday schedule of dialysis, scheduled to get dialysis today, discussed with nephrology, they will arrange for dialysis today    ???Anemia of chronic disease  H&H is stable, anemia is actually improving  Further management as per nephrology    ??? Hyponatremia, mildly to ESRD expected to improve with dialysis     ??? Hypothyroidism, continue Synthroid    ??? Chronic fatigue syndrome???continue home medications      Diet: Renal diet  Activity: Out of bed to chair  DVT prophylaxis: Subcu heparin   Isolation precautions: none  Consultations: Nephrology  Anticipated disposition: Home with family  Code status: Full Code    Place as an Observation .  Patient was explained about the risk of admission including and not a complete list including risk of falls,fractures,blood clots,allergic reactions,infections. Patient/family also understands and agrees to the treatment plan including medications and side effect profiles and also understand the risk with radiation while undergoing imaging studies.     The patient and the family/friends (after permission given by the patient to discuss) understand this and agree with the admission plan.       Signed By: Donato Schultz, MD     01/25/19  12:10 PM        Patient's emergency contacts:  Extended Emergency Contact Information  Primary Emergency Contact: [place] Phone: [phone]  Mobile Phone: [phone]  Relation: Parent  Secondary Emergency Contact: [place], [place] Phone: [phone]  Relation: Sister     Please note that this dictation was completed with Dragon, the computer voice recognition software.  Quite often unanticipated grammatical, syntax, homophones, and other interpretive errors are inadvertently transcribed by the computer software.  Please disregard these errors.  Please excuse any errors that have escaped final proofreading.

## 2019-01-25 NOTE — ED Triage Notes (Signed)
Pt states that he is having SOB this morning while getting ready for work. Pt states that he was diagnosed with renal failure and started on HD in the past 2 weeks. [name] due to have HD this evening and appears very pale.

## 2019-01-25 NOTE — Other (Signed)
TRANSFER - OUT REPORT:    Verbal report given to Andrea(name) on John Friedman  being transferred to 5s(unit) for routine progression of care       Report consisted of patient???s Situation, Background, Assessment and   Recommendations(SBAR).     Information from the following report(s) SBAR, Kardex and ED Summary was reviewed with the receiving nurse.    Lines:   Peripheral IV 01/25/19 Left Forearm (Active)   Site Assessment Clean, dry, & intact 01/25/2019  9:04 AM   Dressing Status Clean, dry, & intact 01/25/2019  9:04 AM        Opportunity for questions and clarification was provided.      Patient transported with:   [employer]

## 2019-01-25 NOTE — Consults (Signed)
Consults  by Mechele Dawley, MD at 01/25/19 1224                Author: Mechele Dawley, MD  Service: Nephrology  Author Type: Physician       Filed: 01/25/19 1230  Date of Service: 01/25/19 1224  Status: Signed          Editor: Mechele Dawley, MD (Physician)            Consult Orders        1. IP CONSULT TO NEPHROLOGY [[id number]] ordered by Donato Schultz, MD at 01/25/19 [address], [address]   ???? [place] [address]   Phone: [phone]??????[phone]?? ??        NEPHROLOGY CONSULT NOTE            Patient: John Friedman  MRN: [id number]   PCP: Antony Haste, MD         DOB:     Nov 05, 1965   Age:   53 y.o.   Sex:  male         Referring physician: Dennison Mascot, MD   Reason for consultation: 53 y.o. male with Hypoxemia [B71.69] complicated  by AKI    Admission Date: 01/25/2019  8:44 AM  LOS: 0 days          ASSESSMENT and  PLAN :       AKI:  - unclear cause, progressive CKD vs obstructive uropathy   - HD MWF at [facility]   - last HD was wednesday, TW was 86 kg   - will Challenge the TW    - plan additional UF tomorrow    - dialysis today   ??   Volume overload:   - HD today and plan UF tomorrow    ??   Hypoxic Resp failure    - CTA pending and repeat ECHo pending as well as Cards consult    ??   Anemia of CKD:  - Iron studies in AM   - ESA w/ HD while here   ??   Obstructive uropathy:   - foley in place   - f/u w/ urology as an Ramirez-Perez discussed with:  Pt and nurse and Hospitalist    [place] notified         Thank you for consulting [facility] in the care of your patient.           Subjective:     HPI:  John Friedman is a 53 y.o. Caucasian male who has been admitted to the hospital for  SOB. He is a new start HD from Obstructive Uropathy as well as chronic GN . All his serological work up in HDF was negative. He was here on 9th with similar complain. He did his HD on  weneday and did fine at out Pt hd. He comes with SOB and requring  oxygen.      He has no other complains. Denies any fever          Past Medical Hx:      Past Medical History:        Diagnosis  Date         ?  Anemia  01/21/2019     ?  ARF (acute renal failure) (HCC)            on HD         ?  BPH (benign prostatic hyperplasia)       ?  Cancer ([place])            left testicle- stage III, nonseminomatous germ cell tumor, s/p orchiectomy & BEP x 3 cycles         ?  Chronic fatigue  January 1987     ?  Chronic kidney disease            renal failure, HD         ?  Essential tremor       ?  GERD (gastroesophageal reflux disease)       ?  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  Hyponatremia  01/21/2019     ?  Hypothyroid       ?  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  Lumbar radiculopathy  08/10/2012          S/p steroid injection by Dr Laverta [place] 08/01/12          ?  Normal cardiac stress test  09/05/11     ?  Pleural effusion, bilateral  01/21/2019     ?  Pulmonary edema  01/21/2019     ?  Sleep disturbances            acting out in his sleep         ?  Thrombocytopenia ([place])  01/21/2019            Past Surgical Hx:         Past Surgical History:         Procedure  Laterality  Date          ?  HX CHOLECYSTECTOMY    12/05/2011          abnormal HIDA scan (EF < 2%)          ?  HX COLONOSCOPY    07/12/10          hyperplastic polyp, repeat 10 yrs          ?  HX COLONOSCOPY    02/23/15          normal          ?  HX CYST REMOVAL  Right  01/06/2016          3rd toe ganglion cyst removal - podiatry (Dr Raliegh Ip)          ?  HX GI    09/09/11          EGD- normal          ?  HX HEENT  Left  06/2012          tonsilar bx: benign tissue (Dr Buddy Duty)          ?  HX ORCHIECTOMY  Left  2002     ?  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  HX SHOULDER ARTHROSCOPY  Right  08/13/2018  Allergies        Allergen  Reactions         ?  Latex  Other (comments)              redness           Social Hx:  reports that he has never smoked. He has never used smokeless tobacco. He reports current alcohol use. He reports that he does not use drugs.         Family History         Problem  Relation  Age of Onset          ?  No Known Problems  Mother       ?  Parkinsonism  Father       ?  Arthritis-osteo  Father                scoliosis          ?  No Known Problems  Brother       ?  No Known Problems  Brother       ?  No Known Problems  Sister       ?  Cancer  Maternal Grandfather                prostate          ?  Cancer  Paternal Grandfather                prostate           Review of Systems:   A thorough twelve point review of system was performed today. Pertinent  positives and negatives are mentioned in the HPI. The reminder of the ROS is negative and noncontributory.         Objective:      Vitals:       Vitals:           01/25/19 0853  01/25/19 0900         BP:  (!) 148/94  140/90     Pulse:  99  97     Resp:  20  23     Temp:  98.4 ??F (36.9 ??C)           SpO2:  91%  (!) 89%        I&O's:  No intake/output data recorded.   Visit Vitals      BP  140/90     Pulse  97     Temp  98.4 ??F (36.9 ??C)     Resp  23        SpO2  (!) 89%           Physical Exam:   General:  No apparent Distress   HEENT: PERRL,  No Pallor , No Icterus   Neck: Supple,no mass palpable   Lungs : CTA   CVS: RRR, S1 S2 normal, No murmur    Abdomen: Soft, NT, BS +   Extremities: Edema   Skin: No rash or lesions.   MS: No joint swelling, erythema, warmth   Neurologic: non focal, AAO x 3   Psych: normal affect      Laboratory Results:        Recent Labs           01/25/19   0856     NA  133*     K  3.8     CL  96*  CO2  25     GLU  92     BUN  31*     CREA  7.70*     CA  7.8*     ALB  3.5        ALT  32          Recent Labs           01/25/19   0856     WBC  9.2     HGB  8.3*     HCT  25.8*        PLT  182        No results found for: SDES     Lab Results         Component  Value  Date/Time            Culture result:   NO GROWTH 3 DAYS  01/22/2019 11:45 AM            Culture result:  NO GROWTH 4 DAYS  01/21/2019 10:53 AM          Recent Results (from the past 24 hour(s))     EKG, 12 LEAD, INITIAL          Collection Time: 01/25/19  8:54 AM         Result  Value  Ref Range            Ventricular Rate  96  BPM       Atrial Rate  96  BPM       P-R Interval  184  ms       QRS Duration  86  ms       Q-T Interval  404  ms       QTC Calculation (Bezet)  510  ms       Calculated P Axis  60  degrees       Calculated R Axis  59  degrees       Calculated T Axis  64  degrees       Diagnosis                 ** Poor data quality, interpretation may be adversely affected   Normal sinus rhythm   Prolonged QT   When compared with ECG of 21-Jan-2019 08:31,   No significant change was found          TYPE & SCREEN          Collection Time: 01/25/19  8:56 AM         Result  Value  Ref Range            Crossmatch Expiration  01/28/2019         ABO/Rh(D)  Jenetta Downer POSITIVE         Antibody screen  NEG         CBC WITH AUTOMATED DIFF          Collection Time: 01/25/19  8:56 AM         Result  Value  Ref Range            WBC  9.2  4.1 - 11.1 K/uL       RBC  2.72 (L)  4.10 - 5.70 M/uL       HGB  8.3 (L)  12.1 - 17.0 g/dL       HCT  25.8 (L)  36.6 - 50.3 %       MCV  94.9  80.0 - 99.0 FL       MCH  30.5  26.0 - 34.0 PG       MCHC  32.2  30.0 - 36.5 g/dL       RDW  13.0  11.5 - 14.5 %       PLATELET  182  150 - 400 K/uL       MPV  10.9  8.9 - 12.9 FL       NRBC  0.0  0 PER 100 WBC       ABSOLUTE NRBC  0.00  0.00 - 0.01 K/uL       NEUTROPHILS  84 (H)  32 - 75 %       LYMPHOCYTES  5 (L)  12 - 49 %       MONOCYTES  9  5 - 13 %       EOSINOPHILS  1  0 - 7 %       BASOPHILS  0  0 - 1 %       IMMATURE GRANULOCYTES  1 (H)  0.0 - 0.5 %       ABS. NEUTROPHILS  7.7  1.8 - 8.0 K/UL       ABS. LYMPHOCYTES  0.5 (L)  0.8 - 3.5 K/UL       ABS. MONOCYTES  0.8  0.0 - 1.0 K/UL       ABS. EOSINOPHILS  0.1  0.0 - 0.4 K/UL       ABS. BASOPHILS  0.0  0.0 - 0.1 K/UL       ABS. IMM.  GRANS.  0.1 (H)  0.00 - 0.04 K/UL       DF  SMEAR SCANNED          RBC COMMENTS  ANISOCYTOSIS   2+             RBC COMMENTS  NORMOCYTIC, NORMOCHROMIC          METABOLIC PANEL, COMPREHENSIVE          Collection Time: 01/25/19  8:56 AM         Result  Value  Ref Range            Sodium  133 (L)  136 - 145 mmol/L       Potassium  3.8  3.5 - 5.1 mmol/L       Chloride  96 (L)  97 - 108 mmol/L       CO2  25  21 - 32 mmol/L       Anion gap  12  5 - 15 mmol/L       Glucose  92  65 - 100 mg/dL       BUN  31 (H)  6 - 20 MG/DL       Creatinine  7.70 (H)  0.70 - 1.30 MG/DL       BUN/Creatinine ratio  4 (L)  12 - 20         GFR est AA  9 (L)  >60 ml/min/1.32m       GFR est non-AA  7 (L)  >60 ml/min/1.712m      Calcium  7.8 (L)  8.5 - 10.1 MG/DL       Bilirubin, total  0.6  0.2 - 1.0 MG/DL       ALT (SGPT)  32  12 - 78 U/L       AST (SGOT)  23  15 - 37 U/L  Alk. phosphatase  87  45 - 117 U/L       Protein, total  6.6  6.4 - 8.2 g/dL       Albumin  3.5  3.5 - 5.0 g/dL       Globulin  3.1  2.0 - 4.0 g/dL       A-G Ratio  1.1  1.1 - 2.2         SAMPLES BEING HELD          Collection Time: 01/25/19  8:56 AM         Result  Value  Ref Range            SAMPLES BEING HELD  1BLU         COMMENT                  Add-on orders for these samples will be processed based on acceptable specimen integrity and analyte stability, which may vary by analyte.       NT-PRO BNP          Collection Time: 01/25/19  8:56 AM         Result  Value  Ref Range            NT pro-BNP  6,472 (H)  <125 PG/ML       TROPONIN I          Collection Time: 01/25/19  8:56 AM         Result  Value  Ref Range            Troponin-I, Qt.  <0.05  <0.05 ng/mL              Urine dipstick:      Lab Results         Component  Value  Date/Time            Color  YELLOW/STRAW  08/16/2014 08:53 PM       Appearance  CLEAR  08/16/2014 08:53 PM       Specific gravity  1.005  08/16/2014 08:53 PM       pH (UA)  7.0  08/16/2014 08:53 PM       Protein  NEGATIVE   08/16/2014 08:53  PM       Glucose  NEGATIVE   08/16/2014 08:53 PM       Ketone  15 (A)  08/16/2014 08:53 PM       Bilirubin  NEGATIVE   08/16/2014 08:53 PM       Urobilinogen  0.2  08/16/2014 08:53 PM       Nitrites  NEGATIVE   08/16/2014 08:53 PM       Leukocyte Esterase  NEGATIVE   08/16/2014 08:53 PM       Epithelial cells  FEW  08/16/2014 08:53 PM       Bacteria  NEGATIVE   08/16/2014 08:53 PM       WBC  0-4  08/16/2014 08:53 PM            RBC  0-5  08/16/2014 08:53 PM           I have reviewed the following:     All pertinent labs, microbiology data, radiology imaging for my assessment          Medications list Personally Reviewed   [x]       Yes     []   No         Medications:   Prior to Admission medications       Medication  Sig  Start Date  End Date  Taking?  Authorizing Provider      clomiPRAMINE (ANAFRANIL) 50 mg capsule  Take 50 mg by mouth nightly.        Provider, Historical      tamsulosin (Flomax) 0.4 mg capsule  Take 0.4 mg by mouth daily.        Provider, Historical      liothyronine (CYTOMEL) 5 mcg tablet  Take 5 mcg by mouth daily.        Provider, Historical      OTHER  Supplemental drink - phosphatidylcholine with omega 3 and 6 and dextrose - to help with tremors.        Provider, Historical      pregabalin (Lyrica) 300 mg capsule  Take 300 mg by mouth nightly. For "sleep disturbances" - pt has had injuries from falling out of bed during sleep        Provider, Historical      armodafinil (NUVIGIL) 200 mg tab  Take 1 Tab by mouth daily as needed.        Provider, Historical      propranolol (INDERAL) 20 mg tablet  Take 20 mg by mouth daily. For tremors        Provider, Historical      clonazepam (KLONOPIN) 1 mg tablet  Take 1 mg by mouth two (2) times a day.        Provider, Historical                Thank you for allowing [place] to participate in the care of this patient.    We will follow patient. Please dont hesitate to call with any questions      Mechele Dawley, MD   [facility]  [facility]    [address], [place] [address]   Phone - [phone]    Fax - [phone]   [email or web address]

## 2019-01-26 ENCOUNTER — Observation Stay: Admit: 2019-01-26 | Payer: PRIVATE HEALTH INSURANCE | Primary: Internal Medicine

## 2019-01-26 LAB — CBC WITH AUTOMATED DIFF
ABS. BASOPHILS: 0.1 10*3/uL (ref 0.0–0.1)
ABS. EOSINOPHILS: 0.1 10*3/uL (ref 0.0–0.4)
ABS. IMM. GRANS.: 0 10*3/uL (ref 0.00–0.04)
ABS. LYMPHOCYTES: 0.7 10*3/uL — ABNORMAL LOW (ref 0.8–3.5)
ABS. MONOCYTES: 0.7 10*3/uL (ref 0.0–1.0)
ABS. NEUTROPHILS: 3.7 10*3/uL (ref 1.8–8.0)
ABSOLUTE NRBC: 0 10*3/uL (ref 0.00–0.01)
BASOPHILS: 1 % (ref 0–1)
EOSINOPHILS: 2 % (ref 0–7)
HCT: 22.6 % — ABNORMAL LOW (ref 36.6–50.3)
HGB: 7.4 g/dL — ABNORMAL LOW (ref 12.1–17.0)
IMMATURE GRANULOCYTES: 0 % (ref 0.0–0.5)
LYMPHOCYTES: 13 % (ref 12–49)
MCH: 30.8 PG (ref 26.0–34.0)
MCHC: 32.7 g/dL (ref 30.0–36.5)
MCV: 94.2 FL (ref 80.0–99.0)
MONOCYTES: 13 % (ref 5–13)
MPV: 11.3 FL (ref 8.9–12.9)
NEUTROPHILS: 71 % (ref 32–75)
NRBC: 0 PER 100 WBC
PLATELET: 163 10*3/uL (ref 150–400)
RBC: 2.4 M/uL — ABNORMAL LOW (ref 4.10–5.70)
RDW: 13.1 % (ref 11.5–14.5)
WBC: 5.3 10*3/uL (ref 4.1–11.1)

## 2019-01-26 LAB — METABOLIC PANEL, COMPREHENSIVE
A-G Ratio: 1 — ABNORMAL LOW (ref 1.1–2.2)
ALT (SGPT): 23 U/L (ref 12–78)
AST (SGOT): 13 U/L — ABNORMAL LOW (ref 15–37)
Albumin: 2.9 g/dL — ABNORMAL LOW (ref 3.5–5.0)
Alk. phosphatase: 72 U/L (ref 45–117)
Anion gap: 9 mmol/L (ref 5–15)
BUN/Creatinine ratio: 3 — ABNORMAL LOW (ref 12–20)
BUN: 15 MG/DL (ref 6–20)
Bilirubin, total: 0.4 MG/DL (ref 0.2–1.0)
CO2: 28 mmol/L (ref 21–32)
Calcium: 7.7 MG/DL — ABNORMAL LOW (ref 8.5–10.1)
Chloride: 98 mmol/L (ref 97–108)
Creatinine: 5.61 MG/DL — ABNORMAL HIGH (ref 0.70–1.30)
GFR est AA: 13 mL/min/{1.73_m2} — ABNORMAL LOW (ref >60)
GFR est non-AA: 11 mL/min/{1.73_m2} — ABNORMAL LOW (ref >60)
Globulin: 3 g/dL (ref 2.0–4.0)
Glucose: 84 mg/dL (ref 65–100)
Potassium: 3.5 mmol/L (ref 3.5–5.1)
Protein, total: 5.9 g/dL — ABNORMAL LOW (ref 6.4–8.2)
Sodium: 135 mmol/L — ABNORMAL LOW (ref 136–145)

## 2019-01-26 LAB — PROCALCITONIN
Procalcitonin: 0.19 ng/mL
Procalcitonin: 0.19 ng/mL

## 2019-01-26 LAB — D DIMER: D-dimer: 1.53 mg/L FEU — ABNORMAL HIGH (ref 0.00–0.65)

## 2019-01-26 LAB — PTT: aPTT: 34.7 s — ABNORMAL HIGH (ref 22.1–32.0)

## 2019-01-26 LAB — ECHO ADULT FOLLOW-UP OR LIMITED
Aortic Root: 3.57 cm
IVSd: 0.9 cm (ref 0.6–1.0)
LA Major Axis: 3.1 cm
LA/AO Root Ratio: 0.87
LV Mass 2D Index: 72.8 g/m2 (ref 49–115)
LV Mass 2D: 148.5 g (ref 88–224)
LVIDd: 4.74 cm (ref 4.2–5.9)
LVIDs: 3.29 cm
LVPWd: 0.76 cm (ref 0.6–1.0)
Left Ventricular Fractional Shortening by 2D: 30.5968 %

## 2019-01-26 LAB — SARS-COV-2: SARS-CoV-2 by PCR: NOT DETECTED

## 2019-01-26 LAB — PROTHROMBIN TIME + INR
INR: 1.1 (ref 0.9–1.1)
Prothrombin time: 11.4 s — ABNORMAL HIGH (ref 9.0–11.1)

## 2019-01-26 LAB — CULTURE, BODY FLUID W GRAM STAIN
Culture result:: NO GROWTH
GRAM STAIN: NONE SEEN

## 2019-01-26 LAB — CULTURE, BLOOD, PAIRED
Culture result:: NO GROWTH
Culture: NO GROWTH

## 2019-01-26 LAB — FIBRINOGEN
Fibrinogen: 411 mg/dL (ref 200–475)
Fibrinogen: 411 mg/dL (ref 200–475)

## 2019-01-26 LAB — COMPREHENSIVE METABOLIC PANEL
ALT: 23 U/L (ref 12–78)
AST: 13 U/L — ABNORMAL LOW (ref 15–37)
Albumin/Globulin Ratio: 1 — ABNORMAL LOW (ref 1.1–2.2)
Albumin: 2.9 g/dL — ABNORMAL LOW (ref 3.5–5.0)
Alkaline Phosphatase: 72 U/L (ref 45–117)
Anion Gap: 9 mmol/L (ref 5–15)
BUN: 15 MG/DL (ref 6–20)
Bun/Cre Ratio: 3 — ABNORMAL LOW (ref 12–20)
CO2: 28 mmol/L (ref 21–32)
Calcium: 7.7 MG/DL — ABNORMAL LOW (ref 8.5–10.1)
Chloride: 98 mmol/L (ref 97–108)
Creatinine: 5.61 MG/DL — ABNORMAL HIGH (ref 0.70–1.30)
EGFR IF NonAfrican American: 11 mL/min/{1.73_m2} — ABNORMAL LOW (ref >60)
GFR African American: 13 mL/min/{1.73_m2} — ABNORMAL LOW (ref >60)
Globulin: 3 g/dL (ref 2.0–4.0)
Glucose: 84 mg/dL (ref 65–100)
Potassium: 3.5 mmol/L (ref 3.5–5.1)
Sodium: 135 mmol/L — ABNORMAL LOW (ref 136–145)
Total Bilirubin: 0.4 MG/DL (ref 0.2–1.0)
Total Protein: 5.9 g/dL — ABNORMAL LOW (ref 6.4–8.2)

## 2019-01-26 LAB — TRANSTHORACIC ECHOCARDIOGRAM (TTE) LIMITED (CONTRAST/BUBBLE/3D PRN)
Aortic Root: 3.57 cm
Fractional Shortening 2D: 30.5968 %
IVSd: 0.9 cm (ref 0.6–1)
LA Major Axis: 3.1 cm
LA/AO Root Ratio: 0.87
LV Mass 2D Index: 72.8 g/m2 (ref 49–115)
LV Mass 2D: 148.5 g (ref 88–224)
LVIDd: 4.74 cm (ref 4.2–5.9)
LVIDs: 3.29 cm
LVPWd: 0.76 cm (ref 0.6–1)
Left Ventricular Ejection Fraction: 58

## 2019-01-26 LAB — CBC WITH AUTO DIFFERENTIAL
Basophils %: 1 % (ref 0–1)
Basophils Absolute: 0.1 10*3/uL (ref 0.0–0.1)
Eosinophils %: 2 % (ref 0–7)
Eosinophils Absolute: 0.1 10*3/uL (ref 0.0–0.4)
Granulocyte Absolute Count: 0 10*3/uL (ref 0.00–0.04)
Hematocrit: 22.6 % — ABNORMAL LOW (ref 36.6–50.3)
Hemoglobin: 7.4 g/dL — ABNORMAL LOW (ref 12.1–17.0)
Immature Granulocytes: 0 % (ref 0.0–0.5)
Lymphocytes %: 13 % (ref 12–49)
Lymphocytes Absolute: 0.7 10*3/uL — ABNORMAL LOW (ref 0.8–3.5)
MCH: 30.8 PG (ref 26.0–34.0)
MCHC: 32.7 g/dL (ref 30.0–36.5)
MCV: 94.2 FL (ref 80.0–99.0)
MPV: 11.3 FL (ref 8.9–12.9)
Monocytes %: 13 % (ref 5–13)
Monocytes Absolute: 0.7 10*3/uL (ref 0.0–1.0)
NRBC Absolute: 0 10*3/uL (ref 0.00–0.01)
Neutrophils %: 71 % (ref 32–75)
Neutrophils Absolute: 3.7 10*3/uL (ref 1.8–8.0)
Nucleated RBCs: 0 PER 100 WBC
Platelets: 163 10*3/uL (ref 150–400)
RBC: 2.4 M/uL — ABNORMAL LOW (ref 4.10–5.70)
RDW: 13.1 % (ref 11.5–14.5)
WBC: 5.3 10*3/uL (ref 4.1–11.1)

## 2019-01-26 LAB — COVID-19: SARS-CoV-2: NOT DETECTED

## 2019-01-26 LAB — CULTURE, BODY FLUID
Culture: NO GROWTH
Gram Stain Result: NONE SEEN

## 2019-01-26 LAB — PROTIME-INR
INR: 1.1 (ref 0.9–1.1)
Protime: 11.4 s — ABNORMAL HIGH (ref 9.0–11.1)

## 2019-01-26 LAB — APTT: aPTT: 34.7 s — ABNORMAL HIGH (ref 22.1–32.0)

## 2019-01-26 LAB — D-DIMER, QUANTITATIVE: D-Dimer, Quant: 1.53 mg/L FEU — ABNORMAL HIGH (ref 0.00–0.65)

## 2019-01-26 MED ORDER — PERFLUTREN LIPID MICROSPHERES 1.1 MG/ML IV
1.1 mg/mL | INTRAVENOUS | Status: AC
Start: 2019-01-26 — End: ?

## 2019-01-26 MED FILL — DEFINITY 1.1 MG/ML INTRAVENOUS SUSPENSION: 1.1 mg/mL | INTRAVENOUS | Qty: 2

## 2019-01-26 MED FILL — NORMAL SALINE FLUSH 0.9 % INJECTION SYRINGE: INTRAMUSCULAR | Qty: 10

## 2019-01-26 MED FILL — CYTOMEL 5 MCG TABLET: 5 mcg | ORAL | Qty: 1

## 2019-01-26 MED FILL — CLOMIPRAMINE 25 MG CAP: 25 mg | ORAL | Qty: 2

## 2019-01-26 MED FILL — LYRICA 100 MG CAPSULE: 100 mg | ORAL | Qty: 3

## 2019-01-26 MED FILL — TAMSULOSIN SR 0.4 MG 24 HR CAP: 0.4 mg | ORAL | Qty: 1

## 2019-01-26 MED FILL — HEPARIN (PORCINE) 1,000 UNIT/ML IJ SOLN: 1000 unit/mL | INTRAMUSCULAR | Qty: 3.6

## 2019-01-26 MED FILL — CLONAZEPAM 1 MG TAB: 1 mg | ORAL | Qty: 1

## 2019-01-26 MED FILL — HEPARIN (PORCINE) 5,000 UNIT/ML IJ SOLN: 5000 unit/mL | INTRAMUSCULAR | Qty: 1

## 2019-01-26 NOTE — Progress Notes (Signed)
 TRANSFER - OUT REPORT:    Verbal report given to [place] RN (name) on John Friedman  being transferred to 6 E(unit) for routine progression of care       Report consisted of patient's Situation, Background, Assessment and   Recommendations(SBAR).     Information from the following report(s) SBAR, [place], [facility] and Recent Results was reviewed with the receiving nurse.    Lines:   Peripheral IV 01/25/19 Left Forearm (Active)   Site Assessment Clean, dry, & intact 01/26/2019  9:25 AM   Phlebitis Assessment 0 01/26/2019  9:25 AM   Infiltration Assessment 0 01/26/2019  9:25 AM   Dressing Status Clean, dry, & intact 01/26/2019  9:25 AM   Dressing Type Transparent 01/26/2019  9:25 AM   Hub Color/Line Status Capped 01/26/2019  9:25 AM   Action Taken Open ports on tubing capped 01/26/2019  4:22 AM   Alcohol Cap Used Yes 01/26/2019  9:25 AM        Opportunity for questions and clarification was provided.      Patient transported with:   [employer]

## 2019-01-26 NOTE — Progress Notes (Signed)
Patient is resting in bed, alert and oriented, no complaints of pain.      Problem: Airway Clearance - Ineffective  Goal: Achieve or maintain patent airway  Outcome: Progressing Towards Goal     Problem: Gas Exchange - Impaired  Goal: Absence of hypoxia  Outcome: Progressing Towards Goal  Goal: Promote optimal lung function  Outcome: Progressing Towards Goal     Problem: Breathing Pattern - Ineffective  Goal: Ability to achieve and maintain a regular respiratory rate  Outcome: Progressing Towards Goal     Problem: Body Temperature -  Risk of, Imbalanced  Goal: Ability to maintain a body temperature within defined limits  Outcome: Progressing Towards Goal  Goal: Will regain or maintain usual level of consciousness  Outcome: Progressing Towards Goal  Goal: Complications related to the disease process, condition or treatment will be avoided or minimized  Outcome: Progressing Towards Goal     Problem: Isolation Precautions - Risk of Spread of Infection  Goal: Prevent transmission of infectious organism to others  Outcome: Progressing Towards Goal     Problem: Nutrition Deficits  Goal: Optimize nutrtional status  Outcome: Progressing Towards Goal     Problem: Risk for Fluid Volume Deficit  Goal: Maintain normal heart rhythm  Outcome: Progressing Towards Goal  Goal: Maintain absence of muscle cramping  Outcome: Progressing Towards Goal  Goal: Maintain normal serum potassium, sodium, calcium, phosphorus, and pH  Outcome: Progressing Towards Goal     Problem: Loneliness or Risk for Loneliness  Goal: Demonstrate positive use of time alone when socialization is not possible  Outcome: Progressing Towards Goal     Problem: Fatigue  Goal: Verbalize increase energy and improved vitality  Outcome: Progressing Towards Goal     Problem: Patient Education: Go to Patient Education Activity  Goal: Patient/Family Education  Outcome: Progressing Towards Goal     Problem: Falls - Risk of  Goal: *Absence of Falls  Description: Document  Schmid Fall Risk and appropriate interventions in the flowsheet.  Outcome: Progressing Towards Goal  Note: Fall Risk Interventions:            Medication Interventions: Evaluate medications/consider consulting pharmacy, Patient to call before getting OOB, Teach patient to arise slowly                   Problem: Patient Education: Go to Patient Education Activity  Goal: Patient/Family Education  Outcome: Progressing Towards Goal

## 2019-01-26 NOTE — Progress Notes (Signed)
 TRANSFER - IN REPORT:    Verbal report received from [place], Charity fundraiser (name) on John Friedman  being received from Dialysis (unit) for routine progression of care      Report consisted of patient's Situation, Background, Assessment and   Recommendations(SBAR).     Information from the following report(s) SBAR, Procedure Summary and Intake/Output was reviewed with the receiving nurse.    Opportunity for questions and clarification was provided.      Assessment completed upon patient's arrival to unit and care assumed.

## 2019-01-26 NOTE — Progress Notes (Signed)
Progress Notes by Rocco Serene, MD at 01/26/19 [phone]                Author: Rocco Serene, MD  Service: Internal Medicine  Author Type: Physician       Filed: 01/26/19 0903  Date of Service: 01/26/19 0839  Status: Signed          Editor: Rocco Serene, MD (Physician)                                                                                    Hospitalist Progress Note   Rocco Serene, MD   Answering service: [phone] OR [phone] from in house phone         Date of Service:  01/26/2019   NAME:  John Friedman   DOB:  23-Nov-1965   MRN:  [id number]        Admission Summary:        59M p/w SOB   Interval history / Subjective:        Patient seen and examined at bedside, feels better, still on 2L NC, due to have more HD today. COVID -ve, will transfer out.          Assessment & Plan:          #. Volume overload with pulmonary edema-chest x-ray is better than previous   #. Acute hypoxemic respiratory failure, likely 2nd volume overload- COVID -ve x2   - O2 support, wean down O2 as needed, will do 6-minute walk test after dialysis is done over the morning   - Discussed with Dr. Karna Dupes from nephrology, okay to do CT angio with PE protocol   - recent Echo: small pericardial effusion, will repeat limited echocardiogram to see its progress.    - Nephrology recommends a cardiology consult for completion of care given patient's recurrent admissions and unresolved symptoms despite being on dialysis.   ??   Thrombocytopenia-now resolved   #. ESRD: on HD MWF, consult nephrology to continue HD while inpatient.   Anemia of chronic disease: -H&H is stable, monitor    Hyponatremia, mild, ESRD expected to improve with dialysis   Hypothyroidism, continue Synthroid   Chronic fatigue syndrome-continue home medications      Code status: Full   DVT prophylaxis: Heparin   Care Plan discussed with: Patient/Family and Nurse   Disposition: TBD           Hospital Problems   Date Reviewed:  01/25/2019                          Codes  Class  Noted  POA              * (Principal) Hypoxemia  ICD-10-CM: R09.02   ICD-9-CM: 799.02    01/25/2019  Yes                        ESRD (end stage renal disease) on dialysis [facility])  ICD-10-CM: N18.6, Z99.2   ICD-9-CM: 585.6, V45.11    01/25/2019  Yes  Pericardial effusion  ICD-10-CM: I31.3   ICD-9-CM: 423.9    01/25/2019  Yes                        Essential tremor  ICD-10-CM: G25.0   ICD-9-CM: 333.1    Unknown  Yes                        GERD (gastroesophageal reflux disease)  ICD-10-CM: K21.9   ICD-9-CM: 530.81    Unknown  Yes          Overview Signed 09/26/2011  7:16 AM by Antony Haste, MD            Normal EGD (09/09/11)                                    Chronic fatigue  ICD-10-CM: I14.43   ICD-9-CM: 780.79    08/15/1985  Yes                         Review of Systems:     Pertinent items are mentioned in interval history.        Vital Signs:      Last 24hrs VS reviewed since prior progress note. Most recent are:   Visit Vitals      BP  139/84 (BP Patient Position: At rest)     Pulse  96     Temp  98.7 ??F (37.1 ??C)     Resp  16     Ht  5\' 11"  (1.803 m)     Wt  84.3 kg (185 lb 12.8 oz)     SpO2  92%        BMI  25.91 kg/m??              Intake/Output Summary (Last 24 hours) at 01/26/2019 0839   Last data filed at 01/26/2019 0422     Gross per 24 hour        Intake  --        Output  3975 ml        Net  -3975 ml              Physical Examination:     General:  Alert, oriented, No acute distress   Resp:  No accessory muscle use, Good AE, no wheezes   Abd:  Soft, non-tender, non-distended   Extremities:  No cyanosis or clubbing, no significant edema   Neuro:  Grossly normal, no focal neuro deficits, follows commands    Psych:  Good insight, AAOx3, not agitated.        Data Review:      Review and/or order of clinical lab test   Review and/or order of tests in the radiology section of CPT   Review and/or order of tests in the medicine section of CPT     Labs:          Recent Labs             01/26/19   0440  01/25/19   1555     WBC  5.3  7.7     HGB  7.4*  7.3*     HCT  22.6*  21.7*         PLT  163  147*  Recent Labs             01/26/19   0440  01/25/19   1555  01/25/19   0856     NA  135*  133*  133*     K  3.5  3.8  3.8     CL  98  95*  96*     CO2  28  25  25      BUN  15  33*  31*     CREA  5.61*  8.49*  7.70*     GLU  84  76  92          CA  7.7*  7.1*  7.8*          Recent Labs             01/26/19   0440  01/25/19   1555  01/25/19   0856     ALT  23  26  32     AP  72  74  87     TBILI  0.4  0.4  0.6     TP  5.9*  5.3*  6.6     ALB  2.9*  3.0*  3.5          GLOB  3.0  2.3  3.1          Recent Labs            01/26/19   0436  01/25/19   1555     INR  1.1  1.1     PTP  11.4*  11.2*         APTT  34.7*  31.7           Recent Labs           01/25/19   1555        FERR  190         No results found for: FOL, RBCF    No results for input(s): PH, PCO2, PO2 in the last 72 hours.     Recent Labs           01/25/19   0856        TROIQ  <0.05          Lab Results         Component  Value  Date/Time            Cholesterol, total  184  04/09/2018 07:25 AM       HDL Cholesterol  52  04/09/2018 07:25 AM       LDL, calculated  120 (H)  04/09/2018 07:25 AM            Triglyceride  59  04/09/2018 07:25 AM          Lab Results         Component  Value  Date/Time            Glucose (POC)  87  06/26/2013 02:54 AM          Lab Results         Component  Value  Date/Time            Color  YELLOW/STRAW  08/16/2014 08:53 PM       Appearance  CLEAR  08/16/2014 08:53 PM       Specific gravity  1.005  08/16/2014 08:53 PM       pH (UA)  7.0  08/16/2014  08:53 PM       Protein  NEGATIVE   08/16/2014 08:53 PM       Glucose  NEGATIVE   08/16/2014 08:53 PM       Ketone  15 (A)  08/16/2014 08:53 PM       Bilirubin  NEGATIVE   08/16/2014 08:53 PM       Urobilinogen  0.2  08/16/2014 08:53 PM       Nitrites  NEGATIVE   08/16/2014 08:53 PM       Leukocyte Esterase  NEGATIVE   08/16/2014 08:53 PM       Epithelial  cells  FEW  08/16/2014 08:53 PM       Bacteria  NEGATIVE   08/16/2014 08:53 PM       WBC  0-4  08/16/2014 08:53 PM            RBC  0-5  08/16/2014 08:53 PM          Medications Reviewed:          Current Facility-Administered Medications          Medication  Dose  Route  Frequency           ?  sodium chloride (NS) flush 5-40 mL   5-40 mL  IntraVENous  Q8H     ?  sodium chloride (NS) flush 5-40 mL   5-40 mL  IntraVENous  PRN     ?  Marland KitchenPHARMACY TO SUBSTITUTE PER PROTOCOL (Reordered from: armodafinil (NUVIGIL) 200 mg tab)       Per Protocol     ?  clomiPRAMINE (ANAFRANIL) capsule 50 mg   50 mg  Oral  QHS     ?  clonazePAM (KlonoPIN) tablet 1 mg   1 mg  Oral  BID     ?  liothyronine (CYTOMEL) tablet 5 mcg   5 mcg  Oral  DAILY           ?  pregabalin (LYRICA) capsule 300 mg   300 mg  Oral  QHS           ?  tamsulosin (FLOMAX) capsule 0.4 mg   0.4 mg  Oral  DAILY     ?  acetaminophen (TYLENOL) tablet 650 mg   650 mg  Oral  Q6H PRN          Or           ?  acetaminophen (TYLENOL) suppository 650 mg   650 mg  Rectal  Q6H PRN     ?  heparin (porcine) injection 5,000 Units   5,000 Units  SubCUTAneous  Q8H           ?  heparin (porcine) 1,000 unit/mL injection 3,600 Units   3,600 Units  Hemodialysis  DIALYSIS PRN     ______________________________________________________________________   EXPECTED LENGTH OF STAY: - - -   ACTUAL LENGTH OF STAY:          0                 Rocco Serene, MD

## 2019-01-26 NOTE — Consults (Addendum)
Consults by Shelle Iron, MD at 01/26/19 1255                Author: Shelle Iron, MD  Service: Cardiology  Author Type: Physician       Filed: 01/26/19 1455  Date of Service: 01/26/19 1255  Status: Addendum          Editor: Shelle Iron, MD (Physician)          Related Notes: Original Note by Shelle Iron, MD (Physician) filed at 01/26/19 1256            Consult Orders        1. IP CONSULT TO CARDIOLOGY [[id number]] ordered by Donato Schultz, MD at 01/25/19 [facility] Consultation Note          Subjective:         John Friedman is a 53 y.o.  patient who is seen for evaluation of pericardial effusion    He had been short of breaths and said his dialysis is not enough on [facility] so he plans to discuss with his nephrologist about removing more fluid next week   He said dyspnea has been better after right and then left thoracentesis   Echo 01/22/2019 reported small pericardial effusion and    Chest CT reported bilateral pleural effusion with ground glass infiltrates before thoracentesis   LVEF on echo was reported normal by Dr Larey Seat   ECG 6/12 normal sinus rhythm no ST depression or elevation    Stress echo normal in 12/2016      Labs here at [facility]: Hb 7.4 creatinine 5.6       Last seen by Dr Shelly Rubenstein in clinic for chronic fatigue syndrome and chest pain but stress echo was normal so Dr Shelly Rubenstein recommended GI work ups        Patient Active Problem List        Diagnosis  Code         ?  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  Essential tremor  G25.0     ?  Chronic fatigue  R53.82     ?  GERD (gastroesophageal reflux disease)  K21.9     ?  H/O testicular cancer  Z85.47     ?  Sleep disturbances  G47.9     ?  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  Acute renal failure (HCC)  N17.9     ?  Hospital acquired PNA  J18.9, Y95     ?  ARF (acute renal failure) (HCC)  N17.9     ?  Hyponatremia  E87.1     ?  Thrombocytopenia ([place])  D69.6     ?  Anemia  D64.9      ?  Pleural effusion, bilateral  J90     ?  Pulmonary edema  J81.1     ?  History of diastolic dysfunction  G29.52     ?  Hypoxemia  R09.02     ?  ESRD (end stage renal disease) on dialysis ([place])  N18.6, Z99.2         ?  Pericardial effusion  I31.3          Current  Facility-Administered Medications             Medication  Dose  Route  Frequency  Provider  Last Rate  Last Dose              ?  sodium chloride (NS) flush 5-40 mL   5-40 mL  IntraVENous  Q8H  Donato Schultz, MD     10 mL at 01/26/19 0645     ?  sodium chloride (NS) flush 5-40 mL   5-40 mL  IntraVENous  PRN  Donato Schultz, MD           ?  .PHARMACY TO SUBSTITUTE PER PROTOCOL (Reordered from: armodafinil (NUVIGIL) 200 mg tab)       Per Protocol  Donato Schultz, MD           ?  clomiPRAMINE (ANAFRANIL) capsule 50 mg   50 mg  Oral  QHS  Donato Schultz, MD     50 mg at 01/25/19 2153     ?  clonazePAM (KlonoPIN) tablet 1 mg   1 mg  Oral  BID  Donato Schultz, MD     1 mg at 01/26/19 4132     ?  liothyronine (CYTOMEL) tablet 5 mcg   5 mcg  Oral  DAILY  Donato Schultz, MD           ?  pregabalin (LYRICA) capsule 300 mg   300 mg  Oral  QHS  Donato Schultz, MD     300 mg at 01/25/19 2153     ?  tamsulosin (FLOMAX) capsule 0.4 mg   0.4 mg  Oral  DAILY  Donato Schultz, MD     0.4 mg at 01/26/19 4401     ?  acetaminophen (TYLENOL) tablet 650 mg   650 mg  Oral  Q6H PRN  Donato Schultz, MD                Or              ?  acetaminophen (TYLENOL) suppository 650 mg   650 mg  Rectal  Q6H PRN  Donato Schultz, MD           ?  heparin (porcine) injection 5,000 Units   5,000 Units  SubCUTAneous  Q8H  Donato Schultz, MD     5,000 Units at 01/26/19 0272              ?  heparin (porcine) 1,000 unit/mL injection 3,600 Units   3,600 Units  Hemodialysis  DIALYSIS PRN  Mechele Dawley, MD     3,600 Units at 01/25/19 1958          Allergies        Allergen  Reactions         ?  Latex  Other (comments)             redness          Past Medical  History:        Diagnosis  Date         ?  Anemia  01/21/2019     ?  ARF (acute renal failure) (HCC)            on HD         ?  BPH (benign prostatic hyperplasia)       ?  Cancer ([place])            left testicle- stage III, nonseminomatous germ cell tumor,  s/p orchiectomy & BEP x 3 cycles         ?  Chronic fatigue  January 1987     ?  Chronic kidney disease            renal failure, HD         ?  Essential tremor       ?  GERD (gastroesophageal reflux disease)       ?  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  Hyponatremia  01/21/2019     ?  Hypothyroid       ?  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  Lumbar radiculopathy  08/10/2012          S/p steroid injection by Dr Laverta Pickens 08/01/12          ?  Normal cardiac stress test  09/05/11     ?  Pleural effusion, bilateral  01/21/2019     ?  Pulmonary edema  01/21/2019     ?  Sleep disturbances            acting out in his sleep         ?  Thrombocytopenia ([place])  01/21/2019          Past Surgical History:         Procedure  Laterality  Date          ?  HX CHOLECYSTECTOMY    12/05/2011          abnormal HIDA scan (EF < 2%)          ?  HX COLONOSCOPY    07/12/10          hyperplastic polyp, repeat 10 yrs          ?  HX COLONOSCOPY    02/23/15          normal          ?  HX CYST REMOVAL  Right  01/06/2016          3rd toe ganglion cyst removal - podiatry (Dr Raliegh Ip)          ?  HX GI    09/09/11          EGD- normal          ?  HX HEENT  Left  06/2012          tonsilar bx: benign tissue (Dr Buddy Duty)          ?  HX ORCHIECTOMY  Left  2002     ?  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  HX SHOULDER ARTHROSCOPY  Right  08/13/2018          Family History         Problem  Relation  Age of Onset          ?  No Known Problems  Mother       ?  Parkinsonism  Father       ?  Arthritis-osteo  Father                scoliosis          ?  No Known Problems  Brother       ?  No Known Problems  Brother       ?  No Known Problems   Sister       ?  Cancer  Maternal Grandfather                prostate          ?  Cancer  Paternal Grandfather                prostate          Social History          Tobacco Use         ?  Smoking status:  Never Smoker     ?  Smokeless tobacco:  Never Used       Substance Use Topics         ?  Alcohol use:  Yes             Comment: rarely            Review of Systems:    Constitutional: Negative for fever, chills, weight loss, + malaise/fatigue.    HEENT: Negative for nosebleeds, vision changes.    Respiratory: Negative for cough, hemoptysis   Cardiovascular: Negative for chest pain, palpitations, orthopnea, claudication, leg swelling, syncope, and PND. + DOE   Gastrointestinal: Negative for nausea, vomiting, diarrhea, blood in stool and melena.    Genitourinary: Negative for dysuria, and hematuria.    Musculoskeletal: Negative for myalgias, arthralgia.    Skin: Negative for rash.    Heme: Does not bleed or bruise easily.    Neurological: Negative for speech change and focal weakness         Objective:        Visit Vitals      BP  147/80 (BP 1 Location: Left arm, BP Patient Position: At rest)     Pulse  90     Temp  98.3 ??F (36.8 ??C)     Resp  16     Ht  5\' 11"  (1.803 m)     Wt  185 lb 12.8 oz (84.3 kg)     SpO2  91%        BMI  25.91 kg/m??         Physical Exam:    Constitutional: well-developed and well-nourished. No respiratory  distress.    Head: Normocephalic and atraumatic.    Eyes: Pupils are equal, round   ENT: hearing normal   Neck: supple. No JVD present.    Cardiovascular: Normal rate, regular rhythm. Exam reveals no gallop and no friction rub. No murmur heard.   Pulmonary/Chest: Effort normal and breath sounds decreased at left base. No wheezes.    Abdominal: Soft, no tenderness.   Musculoskeletal: no edema.   Neurological: alert,oriented.    Skin: Skin is warm and dry  Psychiatric: normal mood and affect. Behavior is normal. Judgment and thought content normal.             Assessment/Plan:      Pericardial effusion is small on previous echo.  Will try to obtain 2 D echo today to reassess   I have discussed with him that if effusion is large and having sign of tamponade then I may recommend pericardiocentesis.  I described risks and benefits of procedure to him    He is more willing to have more fluid removal with dialysis to reduce the risk of pleural and pericardial  fluid accumulation    He said he will discuss with his nephrologist next Tuesday   His usual dialysis is [facility]          Thank you for involving me in this patient's care and please call with further concerns or questions.         Denton Meek, M.D.   Electrophysiology/Cardiology   [facility]   [address] [place] [address]                                 [phone]

## 2019-01-26 NOTE — Progress Notes (Signed)
 [facility] [place]  Acutes  [phone]    Vitals   Pre   Post   Assessment   Pre   Post     Temp  Temp: 98.2 F (36.8 C) (01/26/19 1628)  98 LOC  A&Ox4, cooperative, follows commands A&Ox4, cooperative, follows commands   HR   Pulse (Heart Rate): 90 (01/26/19 1628) 87 Lungs   Diminished b/l Diminished b/l   B/P   BP: 137/78 (01/26/19 1658) 125/84 Cardiac   Regular, S1, S2 Regular, S1, S2   Resp   Resp Rate: 16 (01/26/19 1628) 16 Skin   Pale, R IJ CVC Pale, R IJ CVC   Pain level  0 0 Edema  None     None   Orders:    Duration:   Start:    1628 End:    2002 Total:   3.5 hours   Dialyzer:   Dialyzer/Set Up Inspection: Revaclear (01/26/19 1628)   K Bath:   Dialysate K (mEq/L): 3.5 (01/26/19 1628)   Ca Bath:   Dialysate CA (mEq/L): 2.5 (01/26/19 1628)   Na/Bicarb:   Dialysate NA (mEq/L): 140 (01/26/19 1628)   Target Fluid Removal:   Goal/Amount of Fluid to Remove (mL): 2000 mL (01/26/19 1628)   Access     Type & Location:   RIJ CVC: Dressing CDI, new dressing applied today 01/26/19 . No s/s of infection. Both lumens aspirate & flush well. Running well at BFR 400.       Labs     Obtained/Reviewed   Critical Results Called   Date when labs were drawn-  Hgb-    HGB   Date Value Ref Range Status   01/26/2019 7.4 (L) 12.1 - 17.0 g/dL Final     K-    Potassium   Date Value Ref Range Status   01/26/2019 3.5 3.5 - 5.1 mmol/L Final     Ca-   Calcium   Date Value Ref Range Status   01/26/2019 7.7 (L) 8.5 - 10.1 MG/DL Final     Bun-   BUN   Date Value Ref Range Status   01/26/2019 15 6 - 20 MG/DL Final     Creat-   Creatinine   Date Value Ref Range Status   01/26/2019 5.61 (H) 0.70 - 1.30 MG/DL Final     Comment:     INVESTIGATED PER DELTA CHECK PROTOCOL        Medications/ Blood Products Given     Name   Dose   Route and Time     Heparin 1:1000 3600 Units  CVC dwell end of treatment             Blood Volume Processed (BVP):    77.9 L Net Fluid   Removed:  2000 mL   Comments   Time Out Done: Yes  Primary  Nurse Rpt Pre: Berwyn Lowers, RN  Primary Nurse Rpt Post: Berwyn Lowers, RN  Pt Education: Ordered HD treatment  Care Plan: Infection Prevention  Tx Summary:    SBAR received from Primary RN. Pt A&Ox4. Dialysis RN transported patient to dialysis suite, reviewed ordered dialysis treatment with patient, pt verbalized understanding.  Consent signed & on file.   1628: Both lumens of permcath disinfected with Prevantics per policy.  Each lumen aspirated for blood return and flushed with Normal Saline per policy. VSS. Dialysis Tx initiated.   1730: Pt resting quietly, VSS, no c/o pain.  1830: Pt resting quietly, VSS, no c/o pain.  2002: Tx ended. VSS. All possible blood returned to patient. Central line catheter flushed with normal saline per policy.  Ports disinfected with Prevantics per policy, lines disconnected, Heparin dwells instilled, and lines capped using aseptic technique. Bed locked and in the lowest position, call bell and belongings in reach. SBAR given to Primary, Charity fundraiser. Patient is stable at time of their departure.    All Dialysis related medications have been reviewed.      Admiting Diagnosis: Acute Respiratory Failure  Pt's previous clinic- [facility] MWF  Consent signed - Informed Consent Verified: Yes (01/26/19 1628)  [facility] Consent - Signed and on file   Hepatitis Status- 01/18/19 HBsAG Negative, 01/18/19 HBsAB <10 Susceptible  Machine #- Machine Number: [id number] (01/26/19 1628)  Telemetry status- Medical  Pre-dialysis wt.-  N/A

## 2019-01-26 NOTE — Progress Notes (Signed)
Bedside and Verbal shift change report given to [facility] (Cabin crew) by Irving Burton, RN (offgoing nurse). Report included the following information SBAR, Kardex, Intake/Output and MAR.

## 2019-01-26 NOTE — Progress Notes (Signed)
Received hand off report for this patient which is currently off the unit in Dialysis.     2015: Received report from dialysis RN, patient has completed UFD with 2 Kilos removed. Also noted that the scheduled ECHO was also completed while patient was in the dialysis unit.

## 2019-01-26 NOTE — Progress Notes (Signed)
TRANSFER - IN REPORT:    Verbal report received from [place], Therapist, sports (name) on ZAYDEN MAFFEI  being received from Dialysis (unit) for routine progression of care      Report consisted of patient???s Situation, Background, Assessment and   Recommendations(SBAR).     Information from the following report(s) SBAR, Procedure Summary and Intake/Output was reviewed with the receiving nurse.    Opportunity for questions and clarification was provided.      Assessment completed upon patient???s arrival to unit and care assumed.

## 2019-01-26 NOTE — Consults (Addendum)
[facility] Consultation Note     Subjective:      John Friedman is a 53 y.o. patient who is seen for evaluation of pericardial effusion   He had been short of breaths and said his dialysis is not enough on [facility] so he plans to discuss with his nephrologist about removing more fluid next week  He said dyspnea has been better after right and then left thoracentesis  Echo 01/22/2019 reported small pericardial effusion and   Chest CT reported bilateral pleural effusion with ground glass infiltrates before thoracentesis  LVEF on echo was reported normal by Dr Larey Seat  ECG 6/12 normal sinus rhythm no ST depression or elevation   Stress echo normal in 12/2016    Labs here at [facility]: Hb 7.4 creatinine 5.6     Last seen by Dr Shelly Rubenstein in clinic for chronic fatigue syndrome and chest pain but stress echo was normal so Dr Shelly Rubenstein recommended GI work ups    Patient Active Problem List   Diagnosis Code   ??? Subclinical hypothyroidism E03.9   ??? Essential tremor G25.0   ??? Chronic fatigue R53.82   ??? GERD (gastroesophageal reflux disease) K21.9   ??? H/O testicular cancer Z85.47   ??? Sleep disturbances G47.9   ??? Irritable bowel syndrome with diarrhea K58.0   ??? Acute renal failure (HCC) N17.9   ??? Hospital acquired PNA J18.9, Y95   ??? ARF (acute renal failure) (HCC) N17.9   ??? Hyponatremia E87.1   ??? Thrombocytopenia (HCC) D69.6   ??? Anemia D64.9   ??? Pleural effusion, bilateral J90   ??? Pulmonary edema J81.1   ??? History of diastolic dysfunction B76.28   ??? Hypoxemia R09.02   ??? ESRD (end stage renal disease) on dialysis (HCC) N18.6, Z99.2   ??? Pericardial effusion I31.3     Current Facility-Administered Medications   Medication Dose Route Frequency Provider Last Rate Last Dose   ??? sodium chloride (NS) flush 5-40 mL  5-40 mL IntraVENous Q8H Donato Schultz, MD   10 mL at 01/26/19 0645   ??? sodium chloride (NS) flush 5-40 mL  5-40 mL IntraVENous PRN Donato Schultz, MD        ??? .PHARMACY TO SUBSTITUTE PER PROTOCOL (Reordered from: armodafinil (NUVIGIL) 200 mg tab)    Per Protocol Donato Schultz, MD       ??? clomiPRAMINE (ANAFRANIL) capsule 50 mg  50 mg Oral QHS Donato Schultz, MD   50 mg at 01/25/19 2153   ??? clonazePAM (KlonoPIN) tablet 1 mg  1 mg Oral BID Donato Schultz, MD   1 mg at 01/26/19 0923   ??? liothyronine (CYTOMEL) tablet 5 mcg  5 mcg Oral DAILY Donato Schultz, MD       ??? pregabalin (LYRICA) capsule 300 mg  300 mg Oral QHS Donato Schultz, MD   300 mg at 01/25/19 2153   ??? tamsulosin (FLOMAX) capsule 0.4 mg  0.4 mg Oral DAILY Donato Schultz, MD   0.4 mg at 01/26/19 0924   ??? acetaminophen (TYLENOL) tablet 650 mg  650 mg Oral Q6H PRN Donato Schultz, MD        Or   ??? acetaminophen (TYLENOL) suppository 650 mg  650 mg Rectal Q6H PRN Donato Schultz, MD       ??? heparin (porcine) injection 5,000 Units  5,000 Units SubCUTAneous Q8H Donato Schultz, MD   5,000 Units at 01/26/19 3151   ??? heparin (porcine) 1,000 unit/mL injection 3,600 Units  3,600 Units Hemodialysis DIALYSIS PRN  Mechele Dawley, MD   3,600 Units at 01/25/19 1958     Allergies   Allergen Reactions   ??? Latex Other (comments)     redness     Past Medical History:   Diagnosis Date   ??? Anemia 01/21/2019   ??? ARF (acute renal failure) (HCC)     on HD   ??? BPH (benign prostatic hyperplasia)    ??? Cancer ([place])     left testicle- stage III, nonseminomatous germ cell tumor, s/p orchiectomy & BEP x 3 cycles   ??? Chronic fatigue January 1987   ??? Chronic kidney disease     renal failure, HD   ??? Essential tremor    ??? GERD (gastroesophageal reflux disease)    ??? 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 Hyponatremia 01/21/2019   ??? Hypothyroid    ??? 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 Lumbar radiculopathy 08/10/2012    S/p steroid injection by Dr Laverta Uncertain 08/01/12    ??? Normal cardiac stress test 09/05/11   ??? Pleural effusion, bilateral 01/21/2019   ??? Pulmonary edema 01/21/2019    ??? Sleep disturbances     acting out in his sleep   ??? Thrombocytopenia ([place]) 01/21/2019     Past Surgical History:   Procedure Laterality Date   ??? HX CHOLECYSTECTOMY  12/05/2011    abnormal HIDA scan (EF < 2%)   ??? HX COLONOSCOPY  07/12/10    hyperplastic polyp, repeat 10 yrs   ??? HX COLONOSCOPY  02/23/15    normal   ??? HX CYST REMOVAL Right 01/06/2016    3rd toe ganglion cyst removal - podiatry (Dr Raliegh Ip)   ??? HX GI  09/09/11    EGD- normal   ??? HX HEENT Left 06/2012    tonsilar bx: benign tissue (Dr Buddy Duty)   ??? HX ORCHIECTOMY Left 2002   ??? HX ORTHOPAEDIC      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46   ??? HX SHOULDER ARTHROSCOPY Right 08/13/2018     Family History   Problem Relation Age of Onset   ??? No Known Problems Mother    ??? Parkinsonism Father    ??? Arthritis-osteo Father         scoliosis   ??? No Known Problems Brother    ??? No Known Problems Brother    ??? No Known Problems Sister    ??? Cancer Maternal Grandfather         prostate   ??? Cancer Paternal Grandfather         prostate     Social History     Tobacco Use   ??? Smoking status: Never Smoker   ??? Smokeless tobacco: Never Used   Substance Use Topics   ??? Alcohol use: Yes     Comment: rarely        Review of Systems:   Constitutional: Negative for fever, chills, weight loss, + malaise/fatigue.   HEENT: Negative for nosebleeds, vision changes.   Respiratory: Negative for cough, hemoptysis  Cardiovascular: Negative for chest pain, palpitations, orthopnea, claudication, leg swelling, syncope, and PND. + DOE  Gastrointestinal: Negative for nausea, vomiting, diarrhea, blood in stool and melena.   Genitourinary: Negative for dysuria, and hematuria.   Musculoskeletal: Negative for myalgias, arthralgia.   Skin: Negative for rash.   Heme: Does not bleed or bruise easily.   Neurological: Negative for speech change and focal weakness     Objective:  Visit Vitals  BP 147/80 (BP 1 Location: Left arm, BP Patient Position: At rest)   Pulse 90   Temp 98.3 ??F (36.8 ??C)   Resp 16   Ht 5\' 11"  (1.803 m)    Wt 185 lb 12.8 oz (84.3 kg)   SpO2 91%   BMI 25.91 kg/m??      Physical Exam:   Constitutional: well-developed and well-nourished. No respiratory distress.   Head: Normocephalic and atraumatic.   Eyes: Pupils are equal, round  ENT: hearing normal  Neck: supple. No JVD present.   Cardiovascular: Normal rate, regular rhythm. Exam reveals no gallop and no friction rub. No murmur heard.  Pulmonary/Chest: Effort normal and breath sounds decreased at left base. No wheezes.   Abdominal: Soft, no tenderness.   Musculoskeletal: no edema.   Neurological: alert,oriented.   Skin: Skin is warm and dry  Psychiatric: normal mood and affect. Behavior is normal. Judgment and thought content normal.        Assessment/Plan:   Pericardial effusion is small on previous echo.  Will try to obtain 2 D echo today to reassess  I have discussed with him that if effusion is large and having sign of tamponade then I may recommend pericardiocentesis.  I described risks and benefits of procedure to him   He is more willing to have more fluid removal with dialysis to reduce the risk of pleural and pericardial fluid accumulation   He said he will discuss with his nephrologist next Tuesday  His usual dialysis is [facility]       Thank you for involving me in this patient's care and please call with further concerns or questions.      Denton Meek, M.D.  Electrophysiology/Cardiology  [facility]  [address] [place] [address]                                [phone]

## 2019-01-26 NOTE — Progress Notes (Signed)
TRANSFER - OUT REPORT:    Verbal report given to [place] (name) on JUEL BELLEROSE  being transferred to 6 E(unit) for routine progression of care       Report consisted of patient???s Situation, Background, Assessment and   Recommendations(SBAR).     Information from the following report(s) SBAR, Kardex, [facility] and Recent Results was reviewed with the receiving nurse.    Lines:   Peripheral IV 01/25/19 Left Forearm (Active)   Site Assessment Clean, dry, & intact 01/26/2019  9:25 AM   Phlebitis Assessment 0 01/26/2019  9:25 AM   Infiltration Assessment 0 01/26/2019  9:25 AM   Dressing Status Clean, dry, & intact 01/26/2019  9:25 AM   Dressing Type Transparent 01/26/2019  9:25 AM   Hub Color/Line Status Capped 01/26/2019  9:25 AM   Action Taken Open ports on tubing capped 01/26/2019  4:22 AM   Alcohol Cap Used Yes 01/26/2019  9:25 AM        Opportunity for questions and clarification was provided.      Patient transported with:   [employer]

## 2019-01-26 NOTE — Progress Notes (Signed)
Patient is resting in bed, alert and oriented, no complaints of pain.      Problem: Airway Clearance - Ineffective  Goal: Achieve or maintain patent airway  Outcome: Progressing Towards Goal     Problem: Gas Exchange - Impaired  Goal: Absence of hypoxia  Outcome: Progressing Towards Goal  Goal: Promote optimal lung function  Outcome: Progressing Towards Goal     Problem: Breathing Pattern - Ineffective  Goal: Ability to achieve and maintain a regular respiratory rate  Outcome: Progressing Towards Goal     Problem: Body Temperature -  Risk of, Imbalanced  Goal: Ability to maintain a body temperature within defined limits  Outcome: Progressing Towards Goal  Goal: Will regain or maintain usual level of consciousness  Outcome: Progressing Towards Goal  Goal: Complications related to the disease process, condition or treatment will be avoided or minimized  Outcome: Progressing Towards Goal     Problem: Isolation Precautions - Risk of Spread of Infection  Goal: Prevent transmission of infectious organism to others  Outcome: Progressing Towards Goal     Problem: Nutrition Deficits  Goal: Optimize nutrtional status  Outcome: Progressing Towards Goal     Problem: Risk for Fluid Volume Deficit  Goal: Maintain normal heart rhythm  Outcome: Progressing Towards Goal  Goal: Maintain absence of muscle cramping  Outcome: Progressing Towards Goal  Goal: Maintain normal serum potassium, sodium, calcium, phosphorus, and pH  Outcome: Progressing Towards Goal     Problem: Loneliness or Risk for Loneliness  Goal: Demonstrate positive use of time alone when socialization is not possible  Outcome: Progressing Towards Goal     Problem: Fatigue  Goal: Verbalize increase energy and improved vitality  Outcome: Progressing Towards Goal     Problem: Patient Education: Go to Patient Education Activity  Goal: Patient/Family Education  Outcome: Progressing Towards Goal     Problem: Falls - Risk of  Goal: *Absence of Falls   Description: Document Schmid Fall Risk and appropriate interventions in the flowsheet.  Outcome: Progressing Towards Goal  Note: Fall Risk Interventions:            Medication Interventions: Evaluate medications/consider consulting pharmacy, Patient to call before getting OOB, Teach patient to arise slowly                   Problem: Patient Education: Go to Patient Education Activity  Goal: Patient/Family Education  Outcome: Progressing Towards Goal

## 2019-01-26 NOTE — Progress Notes (Signed)
Bedside shift change report given to Tarnov (oncoming nurse) by Sheran Luz (offgoing nurse). Report included the following information SBAR, Kardex, MAR and Recent Results.

## 2019-01-26 NOTE — Progress Notes (Signed)
[facility] Team [facility]  [id number]    Vitals   Pre   Post   Assessment   Pre   Post     Temp  Temp: 98.2 ??F (36.8 ??C) (01/26/19 1628)  98 LOC  A&Ox4, cooperative, follows commands A&Ox4, cooperative, follows commands   HR   Pulse (Heart Rate): 90 (01/26/19 1628) 87 Lungs   Diminished b/l Diminished b/l   B/P   BP: 137/78 (01/26/19 1658) 125/84 Cardiac   Regular, S1, S2 Regular, S1, S2   Resp   Resp Rate: 16 (01/26/19 1628) 16 Skin   Pale, R IJ CVC Pale, R IJ CVC   Pain level  0 0 Edema  None     None   Orders:    Duration:   Start:    1628 End:    2002 Total:   3.5 hours   Dialyzer:   Dialyzer/Set Up Inspection: Revaclear (01/26/19 1628)   K Bath:   Dialysate K (mEq/L): 3.5 (01/26/19 1628)   Ca Bath:   Dialysate CA (mEq/L): 2.5 (01/26/19 1628)   Na/Bicarb:   Dialysate NA (mEq/L): 140 (01/26/19 1628)   Target Fluid Removal:   Goal/Amount of Fluid to Remove (mL): 2000 mL (01/26/19 1628)   Access     Type & Location:   RIJ CVC: Dressing CDI, new dressing applied today 01/26/19 . No s/s of infection. Both lumens aspirate & flush well. Running well at BFR 400.       Labs     Obtained/Reviewed   Critical Results Called   Date when labs were drawn-  Hgb-    HGB   Date Value Ref Range Status   01/26/2019 7.4 (L) 12.1 - 17.0 g/dL Final     K-    Potassium   Date Value Ref Range Status   01/26/2019 3.5 3.5 - 5.1 mmol/L Final     Ca-   Calcium   Date Value Ref Range Status   01/26/2019 7.7 (L) 8.5 - 10.1 MG/DL Final     Bun-   BUN   Date Value Ref Range Status   01/26/2019 15 6 - 20 MG/DL Final     Creat-   Creatinine   Date Value Ref Range Status   01/26/2019 5.61 (H) 0.70 - 1.30 MG/DL Final     Comment:     INVESTIGATED PER DELTA CHECK PROTOCOL        Medications/ Blood Products Given     Name   Dose   Route and Time     Heparin 1:1000 3600 Units  CVC dwell end of treatment             Blood Volume Processed (BVP):    77.9 L Net Fluid   Removed:  2000 mL   Comments   Time Out Done: Yes   Primary Nurse Rpt Pre: Sarajane Jews, RN  Primary Nurse Rpt Post: Sarajane Jews, RN  Pt Education: Ordered HD treatment  Care Plan: Infection Prevention  Tx Summary:    SBAR received from Primary RN. Pt A&Ox4. Dialysis RN transported patient to dialysis suite, reviewed ordered dialysis treatment with patient, pt verbalized understanding.  Consent signed & on file.   1628: Both lumens of permcath disinfected with Prevantics per policy.  Each lumen aspirated for blood return and flushed with Normal Saline per policy. VSS. Dialysis Tx initiated.   1730: Pt resting quietly, VSS, no c/o pain.  1830: Pt resting quietly, VSS, no c/o pain.  2002: Tx ended. VSS. All possible blood returned to patient. Central line catheter flushed with normal saline per policy.  Ports disinfected with Prevantics per policy, lines disconnected, Heparin dwells instilled, and lines capped using aseptic technique. Bed locked and in the lowest position, call bell and belongings in reach. SBAR given to Primary, Therapist, sports. Patient is stable at time of their departure.    All Dialysis related medications have been reviewed.      Admiting Diagnosis: Acute Respiratory Failure  Pt's previous clinic- [facility] MWF  Consent signed - Informed Consent Verified: Yes (01/26/19 1628)  [facility] Consent - Signed and on file   Hepatitis Status- 01/18/19 HBsAG Negative, 01/18/19 HBsAB <10 Susceptible  Machine #- Machine Number: [id number] (01/26/19 1628)  Telemetry status- Medical  Pre-dialysis wt.-  N/A

## 2019-01-26 NOTE — Progress Notes (Signed)
Bedside and Verbal shift change report given to [facility] (Soil scientist) by Raquel Sarna, RN (offgoing nurse). Report included the following information SBAR, Kardex, Intake/Output and MAR.

## 2019-01-26 NOTE — Progress Notes (Signed)
Hospitalist Progress Note  Rocco Serene, MD  Answering service: [phone] OR [phone] from in house phone      Date of Service:  01/26/2019  NAME:  John Friedman  DOB:  1966/03/07  MRN:  [id number]    Admission Summary:   5M p/w SOB  Interval history / Subjective:   Patient seen and examined at bedside, feels better, still on 2L NC, due to have more HD today. COVID -ve, will transfer out.     Assessment & Plan:     #. Volume overload with pulmonary edema???chest x-ray is better than previous  #. Acute hypoxemic respiratory failure, likely 2nd volume overload- COVID -ve x2  - O2 support, wean down O2 as needed, will do 6-minute walk test after dialysis is done over the morning  - Discussed with Dr. Karna Dupes from nephrology, okay to do CT angio with PE protocol  - recent Echo: small pericardial effusion, will repeat limited echocardiogram to see its progress.   - Nephrology recommends a cardiology consult for completion of care given patient's recurrent admissions and unresolved symptoms despite being on dialysis.  ??  Thrombocytopenia???now resolved  #. ESRD: on HD MWF, consult nephrology to continue HD while inpatient.  Anemia of chronic disease: -H&H is stable, monitor   Hyponatremia, mild, ESRD expected to improve with dialysis  Hypothyroidism, continue Synthroid  Chronic fatigue syndrome???continue home medications    Code status: Full  DVT prophylaxis: Heparin  Care Plan discussed with: Patient/Family and Nurse  Disposition: TBD     Hospital Problems  Date Reviewed: 01-28-2019          Codes Class Noted POA    * (Principal) Hypoxemia ICD-10-CM: R09.02  ICD-9-CM: 799.02  01/28/19 Yes        ESRD (end stage renal disease) on dialysis [facility]) ICD-10-CM: N18.6, Z99.2  ICD-9-CM: 585.6, V45.11  01/28/19 Yes        Pericardial effusion ICD-10-CM: I31.3  ICD-9-CM: 423.9  2019-01-28 Yes        Essential tremor ICD-10-CM: 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    Vital Signs:    Last 24hrs VS reviewed since prior progress note. Most recent are:  Visit Vitals  BP 139/84 (BP Patient Position: At rest)   Pulse 96   Temp 98.7 ??F (37.1 ??C)   Resp 16   Ht 5\' 11"  (1.803 m)   Wt 84.3 kg (185 lb 12.8 oz)   SpO2 92%   BMI 25.91 kg/m??         Intake/Output Summary (Last 24 hours) at 01/26/2019 0839  Last data filed at 01/26/2019 0422  Gross per 24 hour   Intake ???   Output 3975 ml   Net -3975 ml        Physical Examination:   General:  Alert, oriented, No acute distress  Resp:  No accessory muscle use, Good AE, no wheezes  Abd:  Soft, non-tender, non-distended  Extremities:  No cyanosis or clubbing, no significant edema  Neuro:  Grossly normal, no focal neuro deficits, follows commands   Psych:  Good insight, AAOx3, not agitated.    Data Review:    Review and/or order of clinical lab test  Review and/or order of tests in the radiology section of CPT  Review and/or order of tests in the medicine section of CPT  Labs:     Recent Labs     01/26/19  0440 01/25/19  1555   WBC 5.3 7.7   HGB 7.4* 7.3*   HCT 22.6* 21.7*   PLT 163 147*     Recent Labs     01/26/19  0440 01/25/19  1555 01/25/19  0856   NA 135* 133* 133*   K 3.5 3.8 3.8   CL 98 95* 96*   CO2 28 25 25    BUN 15 33* 31*   CREA 5.61* 8.49* 7.70*   GLU 84 76 92   CA 7.7* 7.1* 7.8*     Recent Labs     01/26/19  0440 01/25/19  1555 01/25/19  0856   ALT 23 26 32   AP 72 74 87   TBILI 0.4 0.4 0.6   TP 5.9* 5.3* 6.6   ALB 2.9* 3.0* 3.5   GLOB 3.0 2.3 3.1     Recent Labs     01/26/19  0436 01/25/19  1555   INR 1.1 1.1   PTP 11.4* 11.2*   APTT 34.7* 31.7      Recent Labs     01/25/19  1555   FERR 190       No results found for: FOL, RBCF   No results for input(s): PH, PCO2, PO2 in the last 72 hours.  Recent Labs     01/25/19  0856   TROIQ <0.05     Lab Results   Component Value Date/Time    Cholesterol, total 184 04/09/2018 07:25 AM    HDL Cholesterol 52 04/09/2018 07:25 AM    LDL, calculated 120 (H) 04/09/2018 07:25 AM    Triglyceride 59 04/09/2018 07:25 AM     Lab Results   Component Value Date/Time    Glucose (POC) 87 06/26/2013 02:54 AM     Lab Results   Component Value Date/Time    Color YELLOW/STRAW 08/16/2014 08:53 PM    Appearance CLEAR 08/16/2014 08:53 PM    Specific gravity 1.005 08/16/2014 08:53 PM    pH (UA) 7.0 08/16/2014 08:53 PM    Protein NEGATIVE  08/16/2014 08:53 PM    Glucose NEGATIVE  08/16/2014 08:53 PM    Ketone 15 (A) 08/16/2014 08:53 PM    Bilirubin NEGATIVE  08/16/2014 08:53 PM    Urobilinogen 0.2 08/16/2014 08:53 PM    Nitrites NEGATIVE  08/16/2014 08:53 PM    Leukocyte Esterase NEGATIVE  08/16/2014 08:53 PM    Epithelial cells FEW 08/16/2014 08:53 PM    Bacteria NEGATIVE  08/16/2014 08:53 PM    WBC 0-4 08/16/2014 08:53 PM    RBC 0-5 08/16/2014 08:53 PM     Medications Reviewed:     Current Facility-Administered Medications   Medication Dose Route Frequency   ??? sodium chloride (NS) flush 5-40 mL  5-40 mL IntraVENous Q8H   ??? sodium chloride (NS) flush 5-40 mL  5-40 mL IntraVENous PRN   ??? .PHARMACY TO SUBSTITUTE PER PROTOCOL (Reordered from: armodafinil (NUVIGIL) 200 mg tab)    Per Protocol   ??? clomiPRAMINE (ANAFRANIL) capsule 50 mg  50 mg Oral QHS   ??? clonazePAM (  KlonoPIN) tablet 1 mg  1 mg Oral BID   ??? liothyronine (CYTOMEL) tablet 5 mcg  5 mcg Oral DAILY   ??? pregabalin (LYRICA) capsule 300 mg  300 mg Oral QHS   ??? tamsulosin (FLOMAX) capsule 0.4 mg  0.4 mg Oral DAILY   ??? acetaminophen (TYLENOL) tablet 650 mg  650 mg Oral Q6H PRN    Or   ??? acetaminophen (TYLENOL) suppository 650 mg  650 mg Rectal Q6H PRN   ??? heparin (porcine) injection 5,000 Units  5,000 Units SubCUTAneous Q8H    ??? heparin (porcine) 1,000 unit/mL injection 3,600 Units  3,600 Units Hemodialysis DIALYSIS PRN   ______________________________________________________________________  EXPECTED LENGTH OF STAY: - - -  ACTUAL LENGTH OF STAY:          0               Rocco Serene, MD

## 2019-01-26 NOTE — Progress Notes (Signed)
Bedside shift change report given to [place] (oncoming nurse) by Sheran Luz (offgoing nurse). Report included the following information SBAR, Kardex, MAR and Recent Results.

## 2019-01-27 LAB — METABOLIC PANEL, COMPREHENSIVE
A-G Ratio: 1 — ABNORMAL LOW (ref 1.1–2.2)
ALT (SGPT): 24 U/L (ref 12–78)
AST (SGOT): 14 U/L — ABNORMAL LOW (ref 15–37)
Albumin: 2.9 g/dL — ABNORMAL LOW (ref 3.5–5.0)
Alk. phosphatase: 72 U/L (ref 45–117)
Anion gap: 7 mmol/L (ref 5–15)
BUN/Creatinine ratio: 3 — ABNORMAL LOW (ref 12–20)
BUN: 13 MG/DL (ref 6–20)
Bilirubin, total: 0.4 MG/DL (ref 0.2–1.0)
CO2: 28 mmol/L (ref 21–32)
Calcium: 8 MG/DL — ABNORMAL LOW (ref 8.5–10.1)
Chloride: 100 mmol/L (ref 97–108)
Creatinine: 4.34 MG/DL — ABNORMAL HIGH (ref 0.70–1.30)
GFR est AA: 17 mL/min/{1.73_m2} — ABNORMAL LOW (ref >60)
GFR est non-AA: 14 mL/min/{1.73_m2} — ABNORMAL LOW (ref >60)
Globulin: 3 g/dL (ref 2.0–4.0)
Glucose: 87 mg/dL (ref 65–100)
Potassium: 3.6 mmol/L (ref 3.5–5.1)
Protein, total: 5.9 g/dL — ABNORMAL LOW (ref 6.4–8.2)
Sodium: 135 mmol/L — ABNORMAL LOW (ref 136–145)

## 2019-01-27 LAB — CBC WITH AUTOMATED DIFF
ABS. BASOPHILS: 0 10*3/uL (ref 0.0–0.1)
ABS. EOSINOPHILS: 0.2 10*3/uL (ref 0.0–0.4)
ABS. IMM. GRANS.: 0 10*3/uL (ref 0.00–0.04)
ABS. LYMPHOCYTES: 0.7 10*3/uL — ABNORMAL LOW (ref 0.8–3.5)
ABS. MONOCYTES: 0.6 10*3/uL (ref 0.0–1.0)
ABS. NEUTROPHILS: 1.8 10*3/uL (ref 1.8–8.0)
ABSOLUTE NRBC: 0 10*3/uL (ref 0.00–0.01)
BASOPHILS: 1 % (ref 0–1)
EOSINOPHILS: 6 % (ref 0–7)
HCT: 23.8 % — ABNORMAL LOW (ref 36.6–50.3)
HGB: 7.8 g/dL — ABNORMAL LOW (ref 12.1–17.0)
IMMATURE GRANULOCYTES: 0 % (ref 0.0–0.5)
LYMPHOCYTES: 20 % (ref 12–49)
MCH: 31.3 PG (ref 26.0–34.0)
MCHC: 32.8 g/dL (ref 30.0–36.5)
MCV: 95.6 FL (ref 80.0–99.0)
MONOCYTES: 18 % — ABNORMAL HIGH (ref 5–13)
MPV: 10.8 FL (ref 8.9–12.9)
NEUTROPHILS: 55 % (ref 32–75)
NRBC: 0 PER 100 WBC
PLATELET: 161 10*3/uL (ref 150–400)
RBC: 2.49 M/uL — ABNORMAL LOW (ref 4.10–5.70)
RDW: 13 % (ref 11.5–14.5)
WBC: 3.3 10*3/uL — ABNORMAL LOW (ref 4.1–11.1)

## 2019-01-27 LAB — PTT: aPTT: 34.3 s — ABNORMAL HIGH (ref 22.1–32.0)

## 2019-01-27 LAB — D DIMER: D-dimer: 1.32 mg/L FEU — ABNORMAL HIGH (ref 0.00–0.65)

## 2019-01-27 LAB — FIBRINOGEN
Fibrinogen: 459 mg/dL (ref 200–475)
Fibrinogen: 459 mg/dL (ref 200–475)

## 2019-01-27 LAB — PROTHROMBIN TIME + INR
INR: 1.1 (ref 0.9–1.1)
Prothrombin time: 10.9 s (ref 9.0–11.1)

## 2019-01-27 LAB — CBC WITH AUTO DIFFERENTIAL
Basophils %: 1 % (ref 0–1)
Basophils Absolute: 0 10*3/uL (ref 0.0–0.1)
Eosinophils %: 6 % (ref 0–7)
Eosinophils Absolute: 0.2 10*3/uL (ref 0.0–0.4)
Granulocyte Absolute Count: 0 10*3/uL (ref 0.00–0.04)
Hematocrit: 23.8 % — ABNORMAL LOW (ref 36.6–50.3)
Hemoglobin: 7.8 g/dL — ABNORMAL LOW (ref 12.1–17.0)
Immature Granulocytes: 0 % (ref 0.0–0.5)
Lymphocytes %: 20 % (ref 12–49)
Lymphocytes Absolute: 0.7 10*3/uL — ABNORMAL LOW (ref 0.8–3.5)
MCH: 31.3 PG (ref 26.0–34.0)
MCHC: 32.8 g/dL (ref 30.0–36.5)
MCV: 95.6 FL (ref 80.0–99.0)
MPV: 10.8 FL (ref 8.9–12.9)
Monocytes %: 18 % — ABNORMAL HIGH (ref 5–13)
Monocytes Absolute: 0.6 10*3/uL (ref 0.0–1.0)
NRBC Absolute: 0 10*3/uL (ref 0.00–0.01)
Neutrophils %: 55 % (ref 32–75)
Neutrophils Absolute: 1.8 10*3/uL (ref 1.8–8.0)
Nucleated RBCs: 0 PER 100 WBC
Platelets: 161 10*3/uL (ref 150–400)
RBC: 2.49 M/uL — ABNORMAL LOW (ref 4.10–5.70)
RDW: 13 % (ref 11.5–14.5)
WBC: 3.3 10*3/uL — ABNORMAL LOW (ref 4.1–11.1)

## 2019-01-27 LAB — COMPREHENSIVE METABOLIC PANEL
ALT: 24 U/L (ref 12–78)
AST: 14 U/L — ABNORMAL LOW (ref 15–37)
Albumin/Globulin Ratio: 1 — ABNORMAL LOW (ref 1.1–2.2)
Albumin: 2.9 g/dL — ABNORMAL LOW (ref 3.5–5.0)
Alkaline Phosphatase: 72 U/L (ref 45–117)
Anion Gap: 7 mmol/L (ref 5–15)
BUN: 13 MG/DL (ref 6–20)
Bun/Cre Ratio: 3 — ABNORMAL LOW (ref 12–20)
CO2: 28 mmol/L (ref 21–32)
Calcium: 8 MG/DL — ABNORMAL LOW (ref 8.5–10.1)
Chloride: 100 mmol/L (ref 97–108)
Creatinine: 4.34 MG/DL — ABNORMAL HIGH (ref 0.70–1.30)
EGFR IF NonAfrican American: 14 mL/min/{1.73_m2} — ABNORMAL LOW (ref >60)
GFR African American: 17 mL/min/{1.73_m2} — ABNORMAL LOW (ref >60)
Globulin: 3 g/dL (ref 2.0–4.0)
Glucose: 87 mg/dL (ref 65–100)
Potassium: 3.6 mmol/L (ref 3.5–5.1)
Sodium: 135 mmol/L — ABNORMAL LOW (ref 136–145)
Total Bilirubin: 0.4 MG/DL (ref 0.2–1.0)
Total Protein: 5.9 g/dL — ABNORMAL LOW (ref 6.4–8.2)

## 2019-01-27 LAB — PROTIME-INR
INR: 1.1 (ref 0.9–1.1)
Protime: 10.9 s (ref 9.0–11.1)

## 2019-01-27 LAB — APTT: aPTT: 34.3 s — ABNORMAL HIGH (ref 22.1–32.0)

## 2019-01-27 LAB — D-DIMER, QUANTITATIVE: D-Dimer, Quant: 1.32 mg/L FEU — ABNORMAL HIGH (ref 0.00–0.65)

## 2019-01-27 MED FILL — HEPARIN (PORCINE) 5,000 UNIT/ML IJ SOLN: 5000 unit/mL | INTRAMUSCULAR | Qty: 1

## 2019-01-27 MED FILL — TAMSULOSIN SR 0.4 MG 24 HR CAP: 0.4 mg | ORAL | Qty: 1

## 2019-01-27 MED FILL — NORMAL SALINE FLUSH 0.9 % INJECTION SYRINGE: INTRAMUSCULAR | Qty: 10

## 2019-01-27 MED FILL — CLONAZEPAM 1 MG TAB: 1 mg | ORAL | Qty: 1

## 2019-01-27 MED FILL — LYRICA 100 MG CAPSULE: 100 mg | ORAL | Qty: 3

## 2019-01-27 MED FILL — CYTOMEL 5 MCG TABLET: 5 mcg | ORAL | Qty: 1

## 2019-01-27 NOTE — Discharge Summary (Signed)
Discharge Summary by Jaye Beagle, MD at 01/27/19 1140                Author: Jaye Beagle, MD  Service: Internal Medicine  Author Type: Physician       Filed: 01/27/19 1142  Date of Service: 01/27/19 1140  Status: Signed          Editor: Jaye Beagle, MD (Physician)                       Discharge Summary           PATIENT ID: John Friedman   MRN: [id number]    DATE OF BIRTH: 05-15-1966     DATE OF ADMISSION: 01/25/2019  8:44 AM     DATE OF DISCHARGE: 01/27/19    PRIMARY CARE PROVIDER: Antony Haste, MD        ATTENDING PHYSICIAN: Jaye Beagle   DISCHARGING PROVIDER: Jaye Beagle, MD     To contact this individual call [phone] and ask the operator to page.  If unavailable ask to be transferred the Adult Hospitalist Department.      CONSULTATIONS: IP CONSULT TO NEPHROLOGY   IP CONSULT TO HOSPITALIST   IP CONSULT TO CARDIOLOGY      PROCEDURES/SURGERIES: * No surgery found *      ADMITTING DIAGNOSES & HOSPITAL COURSE:      32M p/w SOB     ??     Assessment & Plan:     ??     #. Volume overload with pulmonary edema- chest x-ray is better than previous   #. Acute hypoxemic respiratory failure, likely 2nd volume overload- COVID -ve x2   - CT neg for PE   - recent Echo: small pericardial effusion, seen by cardiology but decided after d/w pt that will try to deal with getting more volume removal with HD    ??   Thrombocytopenia-now resolved   #. ESRD: on HD MWF, consult nephrology to continue HD    Anemia of chronic disease: -H&H is stable, monitor    Hyponatremia, mild, ESRD expected to improve with dialysis   Hypothyroidism, continue Synthroid   Chronic fatigue syndrome-continue home medications                         DISCHARGE DIAGNOSES / PLAN:         1.    D/c home         ADDITIONAL CARE RECOMMENDATIONS:           PENDING TEST RESULTS:    At the time of discharge the following test results are still pending:       FOLLOW UP APPOINTMENTS:       Follow-up Information                Follow up With  Specialties  Details  Why  Contact Info              Antony Haste, MD  Internal Medicine  In 2 weeks    [address]   [address]   [place] [address]   [phone]                       DIET: Cardiac Diet      ACTIVITY: Activity as tolerated      WOUND CARE:       EQUIPMENT needed:  DISCHARGE MEDICATIONS:     Current Discharge Medication List              CONTINUE these medications which have NOT CHANGED          Details        clomiPRAMINE (ANAFRANIL) 50 mg capsule  Take 50 mg by mouth nightly.               tamsulosin (Flomax) 0.4 mg capsule  Take 0.4 mg by mouth daily.               liothyronine (CYTOMEL) 5 mcg tablet  Take 5 mcg by mouth daily.               OTHER  Supplemental drink - phosphatidylcholine with omega 3 and 6 and dextrose - to help with tremors.               pregabalin (Lyrica) 300 mg capsule  Take 300 mg by mouth nightly. For "sleep disturbances" - pt has had injuries from falling out of bed during sleep               armodafinil (NUVIGIL) 200 mg tab  Take 1 Tab by mouth daily as needed.               propranolol (INDERAL) 20 mg tablet  Take 20 mg by mouth daily. For tremors               clonazepam (KLONOPIN) 1 mg tablet  Take 1 mg by mouth two (2) times a day.                            NOTIFY YOUR PHYSICIAN FOR ANY OF THE FOLLOWING:    Fever over 101 degrees for 24 hours.    Chest pain, shortness of breath, fever, chills, nausea, vomiting, diarrhea, change in mentation, falling, weakness, bleeding. Severe pain or pain not relieved by medications.   Or, any other signs or symptoms that you may have questions about.      DISPOSITION:      x   Home With:     OT    PT    HH    RN                   Long term SNF/Inpatient Rehab       Independent/assisted living          Hospice          Other:           PATIENT CONDITION AT DISCHARGE:       Functional status         Poor        Deconditioned         x  Independent         Cognition       x   Lucid         Forgetful           Dementia         Catheters/lines (plus indication)         Foley        PICC        PEG         x  None         Code status       x   Full code  DNR         PHYSICAL EXAMINATION AT DISCHARGE:   General:          Alert, cooperative, no distress, appears stated age.      HEENT:           Atraumatic, anicteric sclerae, pink conjunctivae                           No oral ulcers, mucosa moist, throat clear, dentition fair   Neck:               Supple, symmetrical   Lungs:             Clear to auscultation bilaterally.  No Wheezing or Rhonchi. No rales.   Chest wall:      No tenderness  No Accessory muscle use.   Heart:              Regular  rhythm,  No  murmur   No edema   Abdomen:        Soft, non-tender. Not distended.  Bowel sounds normal   Extremities:     No cyanosis.  No clubbing,                             Skin turgor normal, Capillary refill normal   Skin:                Not pale.  Not Jaundiced  No rashes    Psych:             Not anxious or agitated.   Neurologic:      Alert, moves all extremities, answers questions appropriately and responds to commands          CHRONIC MEDICAL DIAGNOSES:      Problem List as of February 11, 2019  Date Reviewed:  02-11-19                        Codes  Class  Noted - Resolved             Hospital acquired PNA  ICD-10-CM: J18.9, Y95   ICD-9-CM: 761    01/21/2019 - Present                       ARF (acute renal failure) ([place])  ICD-10-CM: N17.9   ICD-9-CM: 584.9    Unknown - Present          Overview Signed 01/21/2019  1:36 PM by Orlinda Blalock, NP            on HD                                   Hyponatremia  ICD-10-CM: E87.1   ICD-9-CM: 276.1    01/21/2019 - Present                       Thrombocytopenia ([place])  ICD-10-CM: D69.6   ICD-9-CM: 287.5    01/21/2019 - Present                       Anemia  ICD-10-CM: D64.9   ICD-9-CM: 285.9    01/21/2019 - Present  Pleural effusion, bilateral  ICD-10-CM: J90   ICD-9-CM: 511.9    01/21/2019 - Present                        Pulmonary edema  ICD-10-CM: J81.1   ICD-9-CM: 329    01/21/2019 - Present                       History of diastolic dysfunction  JJO-84-ZY: Z86.79   ICD-9-CM: V12.59    01/21/2019 - Present          Overview Signed 01/21/2019  1:54 PM by Orlinda Blalock, NP            Chronic                                   Acute renal failure [facility])  ICD-10-CM: N17.9   ICD-9-CM: 584.9    01/20/2019 - Present          Overview Signed 01/20/2019  8:16 PM by Antony Haste, MD            May '20 - due to urinary retention, seen at [facility].  HD on M/W/F.                                   Irritable bowel syndrome with diarrhea  ICD-10-CM: K58.0   ICD-9-CM: 564.1    05/12/2015 - Present                       Subclinical hypothyroidism  ICD-10-CM: E03.9   ICD-9-CM: 244.8    Unknown - Present                       H/O testicular cancer  ICD-10-CM: Z85.47   ICD-9-CM: V10.47    Unknown - Present          Overview Signed 09/19/2011  8:54 AM by Antony Haste, MD            testicular cancer (Stage III)                                   Sleep disturbances  ICD-10-CM: G47.9   ICD-9-CM: 780.50    Unknown - Present                       * (Principal) RESOLVED: Hypoxemia  ICD-10-CM: R09.02   ICD-9-CM: 799.02    01/25/2019 - 01/27/2019                       RESOLVED: ESRD (end stage renal disease) on dialysis [facility])  ICD-10-CM: N18.6, Z99.2   ICD-9-CM: 585.6, V45.11    01/25/2019 - 01/27/2019                       RESOLVED: Pericardial effusion  ICD-10-CM: I31.3   ICD-9-CM: 423.9    01/25/2019 - 01/27/2019                       RESOLVED: Weakness of left upper extremity  ICD-10-CM: R29.898   ICD-9-CM: 729.89    06/26/2013 - 06/26/2013  RESOLVED: Lumbar radiculopathy  ICD-10-CM: M54.16   ICD-9-CM: 724.4    08/10/2012 - 01/01/2016          Overview Signed 08/10/2012  7:48 AM by Antony Haste, MD            S/p steroid injection by Dr Laverta Redwater 08/01/12                                   RESOLVED: Essential tremor   ICD-10-CM: G25.0   ICD-9-CM: 333.1    Unknown - 01/27/2019                       RESOLVED: GERD (gastroesophageal reflux disease)  ICD-10-CM: K21.9   ICD-9-CM: 530.81    Unknown - 01/27/2019          Overview Signed 09/26/2011  7:16 AM by Antony Haste, MD            Normal EGD (09/09/11)                                   RESOLVED: Chronic fatigue  ICD-10-CM: R53.82   ICD-9-CM: 780.79    08/15/1985 - 01/27/2019                          Greater than 30  minutes were spent with the patient on counseling and coordination of care      Signed:    Jaye Beagle, MD   01/27/2019   11:40 AM

## 2019-01-27 NOTE — Progress Notes (Signed)
Echo with trivial pericardial effusion   Will sign off for now  I had discussed with Dr Ignacia Felling

## 2019-01-27 NOTE — Discharge Summary (Signed)
Discharge Summary       PATIENT ID: John Friedman  MRN: [id number]   DATE OF BIRTH: [birth year]    DATE OF ADMISSION: 01/25/2019  8:44 AM    DATE OF DISCHARGE: 01/27/19   PRIMARY CARE PROVIDER: Antony Haste, MD     ATTENDING PHYSICIAN: Jaye Beagle  DISCHARGING PROVIDER: Jaye Beagle, MD    To contact this individual call [phone] and ask the operator to page.  If unavailable ask to be transferred the Adult Hospitalist Department.    CONSULTATIONS: IP CONSULT TO NEPHROLOGY  IP CONSULT TO HOSPITALIST  IP CONSULT TO CARDIOLOGY    PROCEDURES/SURGERIES: * No surgery found *    ADMITTING DIAGNOSES & HOSPITAL COURSE:   77M p/w SOB   ??  Assessment & Plan:   ??  #. Volume overload with pulmonary edema???chest x-ray is better than previous  #. Acute hypoxemic respiratory failure, likely 2nd volume overload- COVID -ve x2  - CT neg for PE  - recent Echo: small pericardial effusion, seen by cardiology but decided after d/w pt that will try to deal with getting more volume removal with HD   ??  Thrombocytopenia???now resolved  #. ESRD: on HD MWF, consult nephrology to continue HD   Anemia of chronic disease: -H&H is stable, monitor   Hyponatremia, mild, ESRD expected to improve with dialysis  Hypothyroidism, continue Synthroid  Chronic fatigue syndrome???continue home medications               DISCHARGE DIAGNOSES / PLAN:      1. D/c home      ADDITIONAL CARE RECOMMENDATIONS:        PENDING TEST RESULTS:   At the time of discharge the following test results are still pending:     FOLLOW UP APPOINTMENTS:    Follow-up Information     Follow up With Specialties Details Why Contact Info    Antony Haste, MD Internal Medicine In 2 weeks  [place]  [place] [address]  [phone]               DIET: Cardiac Diet    ACTIVITY: Activity as tolerated    WOUND CARE:     EQUIPMENT needed:       DISCHARGE MEDICATIONS:  Current Discharge Medication List       CONTINUE these medications which have NOT CHANGED    Details   clomiPRAMINE (ANAFRANIL) 50 mg capsule Take 50 mg by mouth nightly.      tamsulosin (Flomax) 0.4 mg capsule Take 0.4 mg by mouth daily.      liothyronine (CYTOMEL) 5 mcg tablet Take 5 mcg by mouth daily.      OTHER Supplemental drink - phosphatidylcholine with omega 3 and 6 and dextrose - to help with tremors.      pregabalin (Lyrica) 300 mg capsule Take 300 mg by mouth nightly. For "sleep disturbances" - pt has had injuries from falling out of bed during sleep      armodafinil (NUVIGIL) 200 mg tab Take 1 Tab by mouth daily as needed.      propranolol (INDERAL) 20 mg tablet Take 20 mg by mouth daily. For tremors      clonazepam (KLONOPIN) 1 mg tablet Take 1 mg by mouth two (2) times a day.               NOTIFY YOUR PHYSICIAN FOR ANY OF THE FOLLOWING:   Fever over 101 degrees for 24 hours.  Chest pain, shortness of breath, fever, chills, nausea, vomiting, diarrhea, change in mentation, falling, weakness, bleeding. Severe pain or pain not relieved by medications.  Or, any other signs or symptoms that you may have questions about.    DISPOSITION:  x  Home With:   OT  PT  HH  RN       Long term SNF/Inpatient Rehab    Independent/assisted living    Hospice    Other:       PATIENT CONDITION AT DISCHARGE:     Functional status    Poor     Deconditioned    x Independent      Cognition   x  Lucid     Forgetful     Dementia      Catheters/lines (plus indication)    Foley     PICC     PEG    x None      Code status   x  Full code     DNR      PHYSICAL EXAMINATION AT DISCHARGE:  General:          Alert, cooperative, no distress, appears stated age.     HEENT:           Atraumatic, anicteric sclerae, pink conjunctivae                          No oral ulcers, mucosa moist, throat clear, dentition fair  Neck:               Supple, symmetrical  Lungs:             Clear to auscultation bilaterally.  No Wheezing or Rhonchi. No rales.   Chest wall:      No tenderness  No Accessory muscle use.  Heart:              Regular  rhythm,  No  murmur   No edema  Abdomen:        Soft, non-tender. Not distended.  Bowel sounds normal  Extremities:     No cyanosis.  No clubbing,                            Skin turgor normal, Capillary refill normal  Skin:                Not pale.  Not Jaundiced  No rashes   Psych:             Not anxious or agitated.  Neurologic:      Alert, moves all extremities, answers questions appropriately and responds to commands       CHRONIC MEDICAL DIAGNOSES:  Problem List as of Feb 15, 2019 Date Reviewed: February 15, 2019          Codes Class Noted - Resolved    Hospital acquired PNA ICD-10-CM: J18.9, Y95  ICD-9-CM: 132  01/21/2019 - Present        ARF (acute renal failure) ([place]) ICD-10-CM: N17.9  ICD-9-CM: 584.9  Unknown - Present    Overview Signed 01/21/2019  1:36 PM by Orlinda Blalock, NP     on HD             Hyponatremia ICD-10-CM: E87.1  ICD-9-CM: 276.1  01/21/2019 - Present        Thrombocytopenia ([place]) ICD-10-CM: D69.6  ICD-9-CM: 287.5  01/21/2019 - Present        Anemia  ICD-10-CM: D64.9  ICD-9-CM: 285.9  01/21/2019 - Present        Pleural effusion, bilateral ICD-10-CM: J90  ICD-9-CM: 511.9  01/21/2019 - Present        Pulmonary edema ICD-10-CM: J81.1  ICD-9-CM: 595  01/21/2019 - Present        History of diastolic dysfunction GLO-75-IE: Z86.79  ICD-9-CM: V12.59  01/21/2019 - Present    Overview Signed 01/21/2019  1:54 PM by Orlinda Blalock, NP     Chronic             Acute renal failure [facility]) ICD-10-CM: N17.9  ICD-9-CM: 584.9  01/20/2019 - Present    Overview Signed 01/20/2019  8:16 PM by Antony Haste, MD     May '20 - due to urinary retention, seen at [facility]  HD on M/W/F.             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  ICD-9-CM: 564.1  05/12/2015 - Present        Subclinical hypothyroidism ICD-10-CM: E03.9  ICD-9-CM: 244.8  Unknown - Present        H/O testicular cancer ICD-10-CM: Z85.47   ICD-9-CM: V10.47  Unknown - Present    Overview Signed 09/19/2011  8:54 AM by Antony Haste, MD     testicular cancer (Stage III)             Sleep disturbances ICD-10-CM: G47.9  ICD-9-CM: 780.50  Unknown - Present        * (Principal) RESOLVED: Hypoxemia ICD-10-CM: R09.02  ICD-9-CM: 799.02  01/25/2019 - 01/27/2019        RESOLVED: ESRD (end stage renal disease) on dialysis [facility]) ICD-10-CM: N18.6, Z99.2  ICD-9-CM: 585.6, V45.11  01/25/2019 - 01/27/2019        RESOLVED: Pericardial effusion ICD-10-CM: I31.3  ICD-9-CM: 423.9  01/25/2019 - 01/27/2019        RESOLVED: Weakness of left upper extremity ICD-10-CM: R29.898  ICD-9-CM: 729.89  06/26/2013 - 06/26/2013        RESOLVED: Lumbar radiculopathy ICD-10-CM: M54.16  ICD-9-CM: 724.4  08/10/2012 - 01/01/2016    Overview Signed 08/10/2012  7:48 AM by Antony Haste, MD     S/p steroid injection by Dr Laverta [place] 08/01/12             RESOLVED: Essential tremor ICD-10-CM: G25.0  ICD-9-CM: 333.1  Unknown - 01/27/2019        RESOLVED: GERD (gastroesophageal reflux disease) ICD-10-CM: K21.9  ICD-9-CM: 530.81  Unknown - 01/27/2019    Overview Signed 09/26/2011  7:16 AM by Antony Haste, MD     Normal EGD (09/09/11)             RESOLVED: Chronic fatigue ICD-10-CM: P32.95  ICD-9-CM: 780.79  08/15/1985 - 01/27/2019              Greater than 30  minutes were spent with the patient on counseling and coordination of care    Signed:   Jaye Beagle, MD  01/27/2019  11:40 AM

## 2019-01-29 ENCOUNTER — Ambulatory Visit: Attending: Internal Medicine | Primary: Internal Medicine

## 2019-01-29 ENCOUNTER — Ambulatory Visit: Admit: 2019-01-29 | Payer: PRIVATE HEALTH INSURANCE | Attending: Internal Medicine | Primary: Internal Medicine

## 2019-01-29 DIAGNOSIS — N179 Acute kidney failure, unspecified: Secondary | ICD-10-CM

## 2019-01-29 MED ORDER — PREGABALIN 50 MG CAP
50 mg | ORAL_CAPSULE | Freq: Every evening | ORAL | 0 refills | Status: DC
Start: 2019-01-29 — End: 2020-05-21

## 2019-01-29 NOTE — Progress Notes (Signed)
John Friedman is a 53 y.o. male who was seen in clinic today (01/29/2019) for a Transitional Care Appointment          Assessment & Plan:   Diagnoses and all orders for this visit:    1. Acute renal failure, unspecified acute renal failure type ([place])- new dx, differential dx reviewed, and is not entirely sure.  Initial thought was outflow obstruction, but never had any issue w/ BPH in the past.  Will defer to renal and urologist.  He had multiple questions regarding etiology, recovery, and long term options (transplant).  Answered what I could but admitted there are still a lot of unknown.  -     [place] MED LIST    2. Other hypervolemia- likely attributed to above and resolved w/ adjustment in dry weight, reviewed w/ him nothing to suggest PNA and COVID negative x 2    3. Subclinical hypothyroidism- normally has TSH 5-6, started on medications, TSH elevated at 20, no changes at this time, will repeat in a few months.   -     PR DISCHARGE MEDS RECONCILED W/ CURRENT OUTPATIENT MED LIST    4. 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  Reviewed HGB is likely due to ARF.  -     REFERRAL TO ONCOLOGY    5. Sleep disturbances- well controlled, due to HD is on way to much medication, per Epocrates will reduce to dose below.  He is aware to get from his specialist going forward.    -     pregabalin (LYRICA) 50 mg capsule; Take 1 Cap by mouth nightly. Max Daily Amount: 50 mg. For "sleep disturbances" - pt has had injuries from falling out of bed during sleep      He will talk to specialist regarding Anafranil, epocrates says no data.      Follow-up and Dispositions    ?? Return in about 2 months (around 03/31/2019) for Regular follow up.       Subjective:   Khyle was seen today for Hospital Follow Up      Transition Care Management:  Date of admission #1: 01/11/19 -  01/18/19  Location: [facility]  Diagnosis: ARF  Nurse Navigator note reviewed: No    Date of admission #1: 01/21/19 - 01/23/19  Date of admission #2: 01/25/19 - 01/27/19  Location: [facility]  Diagnosis: hypoxia and fluid overload  Nurse Navigator note reviewed: Yes      Patient .  Medication reconciliation was performed today (01/29/2019).  We reviewed the hospital records.  He presented with nausea and fatigue, which had been building for a few weeks/month, and found to be in ARF.  Attributed to outflow obstruction.  Started on HD and discharged.  Admitted to [facility] with fluid overload.  No PNA.  Neg COVID19.  Dialyzed, s/p R thoracentesis and discharged.  1 day later symptoms worsen.  Arranged L sided thoracentesis with some improvement, but again worse the next day.  Readmitted to [facility].  Since then his dry weight adjusted and he reports feeling better.  No further SOB or DOE.  Never had any LE edema.      He has f/u with urology next week, still has indwelling catheter.    He has not seen nephrologist yet.  He is frustrated and feels he is getting the  run around.    Lyrica last refilled on 10/21/18.      Last PDMP Elta [place] as Reviewed:  Review User Review Instant Review Result   Adelai Achey P 01/29/2019 12:18 PM Reviewed PDMP [1]            Brief Labs:     Lab Results   Component Value Date/Time    Sodium 135 01/27/2019 02:11 AM    Potassium 3.6 01/27/2019 02:11 AM    Creatinine 4.34 01/27/2019 02:11 AM    Creatinine, External 0.87 11/14/2017    Cholesterol, total 184 04/09/2018 07:25 AM    HDL Cholesterol 52 04/09/2018 07:25 AM    LDL, calculated 120 04/09/2018 07:25 AM    Triglyceride 59 04/09/2018 07:25 AM    TSH 20.70 01/21/2019 08:34 AM          Prior to Admission medications    Medication Sig Start Date End Date Taking? Authorizing Provider   clomiPRAMINE (ANAFRANIL) 50 mg capsule Take 50 mg by mouth nightly.   Yes Provider, Historical   OTHER Supplemental drink - phosphatidylcholine with omega 3 and 6  and dextrose - to help with tremors.   Yes Provider, Historical   pregabalin (Lyrica) 300 mg capsule Take 300 mg by mouth nightly. For "sleep disturbances" - pt has had injuries from falling out of bed during sleep   Yes Provider, Historical   propranolol (INDERAL) 20 mg tablet Take 20 mg by mouth daily. For tremors   Yes Provider, Historical   clonazepam (KLONOPIN) 1 mg tablet Take 1 mg by mouth two (2) times a day.   Yes Provider, Historical   tamsulosin (Flomax) 0.4 mg capsule Take 0.4 mg by mouth daily.    Provider, Historical   liothyronine (CYTOMEL) 5 mcg tablet Take 5 mcg by mouth daily.    Provider, Historical   armodafinil (NUVIGIL) 200 mg tab Take 1 Tab by mouth daily as needed.    Provider, Historical          Allergies   Allergen Reactions   ??? Latex Other (comments)     redness           Review of Systems   Constitutional: Negative for chills, fever and malaise/fatigue.   Respiratory: Negative for cough and shortness of breath.    Cardiovascular: Negative for chest pain and palpitations.   Gastrointestinal: Negative for abdominal pain, constipation, diarrhea, nausea and vomiting.        Objective:   Physical Exam  Constitutional:       Comments: pale   Cardiovascular:      Rate and Rhythm: Regular rhythm.      Heart sounds: Normal heart sounds. No murmur.   Pulmonary:      Effort: Pulmonary effort is normal.      Breath sounds: Normal breath sounds. No wheezing or rales.   Abdominal:      General: Bowel sounds are normal.      Palpations: Abdomen is soft. There is no mass.      Tenderness: There is no abdominal tenderness.           Visit Vitals  BP 128/80   Pulse 99   Temp 97.8 ??F (36.6 ??C) (Temporal)   Resp 18   Ht 5\' 11"  (1.803 m)   Wt 181 lb (82.1 kg)   SpO2 (!) 74%   BMI 25.24 kg/m??         Disclaimer:  Advised him to call back or return to office if  symptoms worsen/change/persist.  Discussed expected course/resolution/complications of diagnosis in detail with patient.    Medication  risks/benefits/costs/interactions/alternatives discussed with patient.  He was given an after visit summary which includes diagnoses, current medications, & vitals.  He expressed understanding with the diagnosis and plan.      Aspects of this note may have been generated using voice recognition software. Despite editing, there may be some syntax errors.       Antony Haste, MD

## 2019-01-29 NOTE — Progress Notes (Signed)
Hospital follow up.

## 2019-01-29 NOTE — Progress Notes (Signed)
John Friedman is a 53 y.o. male who was seen in clinic today (01/29/2019) for a Transitional Care Appointment          Assessment & Plan:   Diagnoses and all orders for this visit:    1. Acute renal failure, unspecified acute renal failure type ([place])- new dx, differential dx reviewed, and is not entirely sure.  Initial thought was outflow obstruction, but never had any issue w/ BPH in the past.  Will defer to renal and urologist.  He had multiple questions regarding etiology, recovery, and long term options (transplant).  Answered what I could but admitted there are still a lot of unknown.  -     [place] MED LIST    2. Other hypervolemia- likely attributed to above and resolved w/ adjustment in dry weight, reviewed w/ him nothing to suggest PNA and COVID negative x 2    3. Subclinical hypothyroidism- normally has TSH 5-6, started on medications, TSH elevated at 20, no changes at this time, will repeat in a few months.   -     PR DISCHARGE MEDS RECONCILED W/ CURRENT OUTPATIENT MED LIST    4. 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  Reviewed HGB is likely due to ARF.  -     REFERRAL TO ONCOLOGY    5. Sleep disturbances- well controlled, due to HD is on way to much medication, per Epocrates will reduce to dose below.  He is aware to get from his specialist going forward.    -     pregabalin (LYRICA) 50 mg capsule; Take 1 Cap by mouth nightly. Max Daily Amount: 50 mg. For "sleep disturbances" - pt has had injuries from falling out of bed during sleep      He will talk to specialist regarding Anafranil, epocrates says no data.      Follow-up and Dispositions    ?? Return in about 2 months (around 03/31/2019) for Regular follow up.       Subjective:   John Friedman was seen today for Hospital Follow Up      Transition Care Management:  Date of admission #1: 01/11/19 - 01/18/19   Location: [facility]  Diagnosis: ARF  Nurse Navigator note reviewed: No    Date of admission #1: 01/21/19 - 01/23/19  Date of admission #2: 01/25/19 - 01/27/19  Location: [facility]  Diagnosis: hypoxia and fluid overload  Nurse Navigator note reviewed: Yes      Patient .  Medication reconciliation was performed today (01/29/2019).  We reviewed the hospital records.  He presented with nausea and fatigue, which had been building for a few weeks/month, and found to be in ARF.  Attributed to outflow obstruction.  Started on HD and discharged.  Admitted to [facility] with fluid overload.  No PNA.  Neg COVID19.  Dialyzed, s/p R thoracentesis and discharged.  1 day later symptoms worsen.  Arranged L sided thoracentesis with some improvement, but again worse the next day.  Readmitted to [facility].  Since then his dry weight adjusted and he reports feeling better.  No further SOB or DOE.  Never had any LE edema.      He has f/u with urology next week, still has indwelling catheter.    He has not seen nephrologist yet.  He is frustrated and feels he is getting the  run around.    Lyrica last refilled on 10/21/18.      Last PDMP El[place] as Reviewed:  Review User Review Instant Review Result   Byanca Kasper P 01/29/2019 12:18 PM Reviewed PDMP [1]            Brief Labs:     Lab Results   Component Value Date/Time    Sodium 135 01/27/2019 02:11 AM    Potassium 3.6 01/27/2019 02:11 AM    Creatinine 4.34 01/27/2019 02:11 AM    Creatinine, External 0.87 11/14/2017    Cholesterol, total 184 04/09/2018 07:25 AM    HDL Cholesterol 52 04/09/2018 07:25 AM    LDL, calculated 120 04/09/2018 07:25 AM    Triglyceride 59 04/09/2018 07:25 AM    TSH 20.70 01/21/2019 08:34 AM          Prior to Admission medications    Medication Sig Start Date End Date Taking? Authorizing Provider   clomiPRAMINE (ANAFRANIL) 50 mg capsule Take 50 mg by mouth nightly.   Yes Provider, Historical   OTHER Supplemental drink - phosphatidylcholine with omega 3 and 6 and  dextrose - to help with tremors.   Yes Provider, Historical   pregabalin (Lyrica) 300 mg capsule Take 300 mg by mouth nightly. For "sleep disturbances" - pt has had injuries from falling out of bed during sleep   Yes Provider, Historical   propranolol (INDERAL) 20 mg tablet Take 20 mg by mouth daily. For tremors   Yes Provider, Historical   clonazepam (KLONOPIN) 1 mg tablet Take 1 mg by mouth two (2) times a day.   Yes Provider, Historical   tamsulosin (Flomax) 0.4 mg capsule Take 0.4 mg by mouth daily.    Provider, Historical   liothyronine (CYTOMEL) 5 mcg tablet Take 5 mcg by mouth daily.    Provider, Historical   armodafinil (NUVIGIL) 200 mg tab Take 1 Tab by mouth daily as needed.    Provider, Historical          Allergies   Allergen Reactions   ??? Latex Other (comments)     redness           Review of Systems   Constitutional: Negative for chills, fever and malaise/fatigue.   Respiratory: Negative for cough and shortness of breath.    Cardiovascular: Negative for chest pain and palpitations.   Gastrointestinal: Negative for abdominal pain, constipation, diarrhea, nausea and vomiting.        Objective:   Physical Exam  Constitutional:       Comments: pale   Cardiovascular:      Rate and Rhythm: Regular rhythm.      Heart sounds: Normal heart sounds. No murmur.   Pulmonary:      Effort: Pulmonary effort is normal.      Breath sounds: Normal breath sounds. No wheezing or rales.   Abdominal:      General: Bowel sounds are normal.      Palpations: Abdomen is soft. There is no mass.      Tenderness: There is no abdominal tenderness.           Visit Vitals  BP 128/80   Pulse 99   Temp 97.8 ??F (36.6 ??C) (Temporal)   Resp 18   Ht 5\' 11"  (1.803 m)   Wt 181 lb (82.1 kg)   SpO2 (!) 74%   BMI 25.24 kg/m??         Disclaimer:  Advised him to call back or return to office if  symptoms worsen/change/persist.  Discussed expected course/resolution/complications of diagnosis in detail with patient.     Medication risks/benefits/costs/interactions/alternatives discussed with patient.  He was given an after visit summary which includes diagnoses, current medications, & vitals.  He expressed understanding with the diagnosis and plan.      Aspects of this note may have been generated using voice recognition software. Despite editing, there may be some syntax errors.       Antony Haste, MD

## 2019-01-30 NOTE — Telephone Encounter (Signed)
Called patient reference referral to Dr Melony Overly from Dr Amedeo Plenty for h/o testicular ca.  Patient advised "I'm very busy right now. Can we schedule in mid to late July?"  Patient scheduled on 03/01/2019 @ 8am. Patient requested an early appointment, because "mid day is too difficult for him."  Per Dr Melony Overly, can be a 30 minute times slot instead of an hour.

## 2019-02-05 ENCOUNTER — Inpatient Hospital Stay
Admit: 2019-02-05 | Discharge: 2019-02-06 | Disposition: A | Discharge status: Home / Self Care | Payer: PRIVATE HEALTH INSURANCE | Attending: Emergency Medicine

## 2019-02-05 ENCOUNTER — Emergency Department: Admit: 2019-02-05 | Payer: PRIVATE HEALTH INSURANCE | Primary: Internal Medicine

## 2019-02-05 DIAGNOSIS — R5382 Chronic fatigue, unspecified: Secondary | ICD-10-CM

## 2019-02-05 LAB — METABOLIC PANEL, COMPREHENSIVE
A-G Ratio: 1.2 (ref 1.1–2.2)
ALT (SGPT): 24 U/L (ref 12–78)
AST (SGOT): 20 U/L (ref 15–37)
Albumin: 3.6 g/dL (ref 3.5–5.0)
Alk. phosphatase: 91 U/L (ref 45–117)
Anion gap: 11 mmol/L (ref 5–15)
BUN/Creatinine ratio: 4 — ABNORMAL LOW (ref 12–20)
BUN: 21 MG/DL — ABNORMAL HIGH (ref 6–20)
Bilirubin, total: 0.4 MG/DL (ref 0.2–1.0)
CO2: 26 mmol/L (ref 21–32)
Calcium: 8.3 MG/DL — ABNORMAL LOW (ref 8.5–10.1)
Chloride: 94 mmol/L — ABNORMAL LOW (ref 97–108)
Creatinine: 5.56 MG/DL — ABNORMAL HIGH (ref 0.70–1.30)
GFR est AA: 13 mL/min/{1.73_m2} — ABNORMAL LOW (ref >60)
GFR est non-AA: 11 mL/min/{1.73_m2} — ABNORMAL LOW (ref >60)
Globulin: 3 g/dL (ref 2.0–4.0)
Glucose: 87 mg/dL (ref 65–100)
Potassium: 3 mmol/L — ABNORMAL LOW (ref 3.5–5.1)
Protein, total: 6.6 g/dL (ref 6.4–8.2)
Sodium: 131 mmol/L — ABNORMAL LOW (ref 136–145)

## 2019-02-05 LAB — TROPONIN I: Troponin-I, Qt.: <0.05 ng/mL (ref <0.05)

## 2019-02-05 LAB — CBC WITH AUTOMATED DIFF
ABS. BASOPHILS: 0.1 10*3/uL (ref 0.0–0.1)
ABS. EOSINOPHILS: 0.1 10*3/uL (ref 0.0–0.4)
ABS. IMM. GRANS.: 0 10*3/uL (ref 0.00–0.04)
ABS. LYMPHOCYTES: 0.8 10*3/uL (ref 0.8–3.5)
ABS. MONOCYTES: 0.7 10*3/uL (ref 0.0–1.0)
ABS. NEUTROPHILS: 3.4 10*3/uL (ref 1.8–8.0)
ABSOLUTE NRBC: 0 10*3/uL (ref 0.00–0.01)
BASOPHILS: 1 % (ref 0–1)
EOSINOPHILS: 2 % (ref 0–7)
HCT: 26 % — ABNORMAL LOW (ref 36.6–50.3)
HGB: 8.5 g/dL — ABNORMAL LOW (ref 12.1–17.0)
IMMATURE GRANULOCYTES: 0 % (ref 0.0–0.5)
LYMPHOCYTES: 15 % (ref 12–49)
MCH: 31 PG (ref 26.0–34.0)
MCHC: 32.7 g/dL (ref 30.0–36.5)
MCV: 94.9 FL (ref 80.0–99.0)
MONOCYTES: 13 % (ref 5–13)
MPV: 11.5 FL (ref 8.9–12.9)
NEUTROPHILS: 69 % (ref 32–75)
NRBC: 0 PER 100 WBC
PLATELET: 155 10*3/uL (ref 150–400)
RBC: 2.74 M/uL — ABNORMAL LOW (ref 4.10–5.70)
RDW: 14.2 % (ref 11.5–14.5)
WBC: 5.1 10*3/uL (ref 4.1–11.1)

## 2019-02-05 LAB — SAMPLES BEING HELD: SAMPLES BEING HELD: 1

## 2019-02-05 LAB — TYPE & SCREEN
ABO/Rh(D): O POS
Antibody screen: NEGATIVE

## 2019-02-05 LAB — MAGNESIUM
Magnesium: 2.2 mg/dL (ref 1.6–2.4)
Magnesium: 2.2 mg/dL (ref 1.6–2.4)

## 2019-02-05 LAB — URINE CULTURE HOLD SAMPLE

## 2019-02-05 LAB — PHOSPHORUS
Phosphorus: 4.1 MG/DL (ref 2.6–4.7)
Phosphorus: 4.1 MG/DL (ref 2.6–4.7)

## 2019-02-05 LAB — TSH 3RD GENERATION
TSH: 22.6 u[IU]/mL — ABNORMAL HIGH (ref 0.36–3.74)
TSH: 22.6 u[IU]/mL — ABNORMAL HIGH (ref 0.36–3.74)

## 2019-02-05 LAB — CBC WITH AUTO DIFFERENTIAL
Basophils %: 1 % (ref 0–1)
Basophils Absolute: 0.1 10*3/uL (ref 0.0–0.1)
Eosinophils %: 2 % (ref 0–7)
Eosinophils Absolute: 0.1 10*3/uL (ref 0.0–0.4)
Granulocyte Absolute Count: 0 10*3/uL (ref 0.00–0.04)
Hematocrit: 26 % — ABNORMAL LOW (ref 36.6–50.3)
Hemoglobin: 8.5 g/dL — ABNORMAL LOW (ref 12.1–17.0)
Immature Granulocytes: 0 % (ref 0.0–0.5)
Lymphocytes %: 15 % (ref 12–49)
Lymphocytes Absolute: 0.8 10*3/uL (ref 0.8–3.5)
MCH: 31 PG (ref 26.0–34.0)
MCHC: 32.7 g/dL (ref 30.0–36.5)
MCV: 94.9 FL (ref 80.0–99.0)
MPV: 11.5 FL (ref 8.9–12.9)
Monocytes %: 13 % (ref 5–13)
Monocytes Absolute: 0.7 10*3/uL (ref 0.0–1.0)
NRBC Absolute: 0 10*3/uL (ref 0.00–0.01)
Neutrophils %: 69 % (ref 32–75)
Neutrophils Absolute: 3.4 10*3/uL (ref 1.8–8.0)
Nucleated RBCs: 0 PER 100 WBC
Platelets: 155 10*3/uL (ref 150–400)
RBC: 2.74 M/uL — ABNORMAL LOW (ref 4.10–5.70)
RDW: 14.2 % (ref 11.5–14.5)
WBC: 5.1 10*3/uL (ref 4.1–11.1)

## 2019-02-05 LAB — COMPREHENSIVE METABOLIC PANEL
ALT: 24 U/L (ref 12–78)
AST: 20 U/L (ref 15–37)
Albumin/Globulin Ratio: 1.2 (ref 1.1–2.2)
Albumin: 3.6 g/dL (ref 3.5–5.0)
Alkaline Phosphatase: 91 U/L (ref 45–117)
Anion Gap: 11 mmol/L (ref 5–15)
BUN: 21 MG/DL — ABNORMAL HIGH (ref 6–20)
Bun/Cre Ratio: 4 — ABNORMAL LOW (ref 12–20)
CO2: 26 mmol/L (ref 21–32)
Calcium: 8.3 MG/DL — ABNORMAL LOW (ref 8.5–10.1)
Chloride: 94 mmol/L — ABNORMAL LOW (ref 97–108)
Creatinine: 5.56 MG/DL — ABNORMAL HIGH (ref 0.70–1.30)
EGFR IF NonAfrican American: 11 mL/min/{1.73_m2} — ABNORMAL LOW (ref >60)
GFR African American: 13 mL/min/{1.73_m2} — ABNORMAL LOW (ref >60)
Globulin: 3 g/dL (ref 2.0–4.0)
Glucose: 87 mg/dL (ref 65–100)
Potassium: 3 mmol/L — ABNORMAL LOW (ref 3.5–5.1)
Sodium: 131 mmol/L — ABNORMAL LOW (ref 136–145)
Total Bilirubin: 0.4 MG/DL (ref 0.2–1.0)
Total Protein: 6.6 g/dL (ref 6.4–8.2)

## 2019-02-05 LAB — TYPE AND SCREEN
ABO/Rh: O POS
Antibody Screen: NEGATIVE

## 2019-02-05 LAB — TROPONIN: Troponin I: <0.05 ng/mL (ref <0.05)

## 2019-02-05 NOTE — ED Notes (Signed)
Patient received discharge instructions by MD and nurse. Patient verbalized understanding of medication use and other discharge instructions.     Patient ambulated out of ED with steady gait, showing no signs of distress. Pt reports relief of most intense pain. All questions answered.

## 2019-02-05 NOTE — Progress Notes (Signed)
Date of previous inpatient admission/ ED visit?  6/12-6/14/2020    What brought the patient back to ED?  Fatigue and weakness, concerned about ability to take care of himself    Did patient decline recommended services during last admission/ ED visit (if yes, what)?  No    Has patient seen a provider since their last inpatient admission/ED visit (if yes, when)?  Yes dialysis     PCP: First and Last name:  Dr. Renaee Munda   Name of Practice:    Are you a current patient: Yes/No:    Approximate date of last visit:    Met with patient, he is clearly overwhelmed.  He has appointment with psychiatrist tomorrow and appointment with PCP.  Information given on personal care, he verbalized he doesn't have the energy to clean his home and it weighs heavy on his mind.  Verified face sheet and demographics.  Asked him to talk to social worker at dialysis for him to network with other dialysis patient's.  Encouraged him to reach out to family for support.      Care Management Interventions  PCP Verified by CM: Yes(PCP:  Dr. Amedeo Plenty)  Transition of Care Consult (CM Consult): Discharge Planning(Concerns for self care)  MyChart Signup: No  Discharge Durable Medical Equipment: No  Health Maintenance Reviewed: Yes  Physical Therapy Consult: No  Occupational Therapy Consult: No  Speech Therapy Consult: No  Current Support Network: Lives Alone(Lives in apartment, on short term disability, family supportive)  Confirm Follow Up Transport: Self(Drives, independent prior to admission)  Discharge Location  Discharge Placement: Home(Discussed personal care/companion services.  Information given to patient.)    Francoise Schaumann, RN, BSN, [facility]  ED Care Management  [phone]

## 2019-02-05 NOTE — ED Notes (Signed)
Pt reports fatigue and generalized weakness x1 day with accompanying diarrhea x1 month. Pt was recently dx with kidney failure and pt had dialysis yesterday. [name] concerned about not being able to take care of himself. [name] pale in appearance. Pt has been tested twice for COVID with negative results.

## 2019-02-05 NOTE — ED Provider Notes (Signed)
The history is provided by the patient. No language interpreter was used.   Fatigue   This is a chronic problem. The current episode started more than 1 week ago. The problem has not changed since onset.There was no focality noted. Pertinent negatives include no focal weakness, no loss of balance, no slurred speech, no speech difficulty, no memory loss, no movement disorder, no agitation, no visual change, no auditory change, no mental status change, no unresponsiveness and no disorientation. There has been no fever. Associated symptoms include nausea. Pertinent negatives include no shortness of breath, no chest pain, no vomiting, no altered mental status, no confusion, no headaches, no choking, no bowel incontinence and no bladder incontinence.        Past Medical History:   Diagnosis Date   ??? Anemia 01/21/2019   ??? ARF (acute renal failure) (HCC)     on HD   ??? BPH (benign prostatic hyperplasia)    ??? Cancer ([place])     left testicle- stage III, nonseminomatous germ cell tumor, s/p orchiectomy & BEP x 3 cycles   ??? Chronic fatigue January 1987   ??? Chronic kidney disease     renal failure, HD   ??? Essential tremor    ??? GERD (gastroesophageal reflux disease)    ??? 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 Hyponatremia 01/21/2019   ??? Hypothyroid    ??? 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 Lumbar radiculopathy 08/10/2012    S/p steroid injection by Dr Laverta [place] 08/01/12    ??? Normal cardiac stress test 09/05/11   ??? Pleural effusion, bilateral 01/21/2019   ??? Pulmonary edema 01/21/2019   ??? Sleep disturbances     acting out in his sleep   ??? Thrombocytopenia ([place]) 01/21/2019       Past Surgical History:   Procedure Laterality Date   ??? HX CHOLECYSTECTOMY  12/05/2011    abnormal HIDA scan (EF < 2%)   ??? HX COLONOSCOPY  07/12/10    hyperplastic polyp, repeat 10 yrs   ??? HX COLONOSCOPY  02/23/15    normal   ??? HX CYST REMOVAL Right 01/06/2016    3rd toe ganglion cyst removal - podiatry (Dr Raliegh Ip)   ??? HX GI  09/09/11    EGD-  normal   ??? HX HEENT Left 06/2012    tonsilar bx: benign tissue (Dr Buddy Duty)   ??? HX ORCHIECTOMY Left 2002   ??? HX ORTHOPAEDIC      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23   ??? HX SHOULDER ARTHROSCOPY Right 08/13/2018         Family History:   Problem Relation Age of Onset   ??? No Known Problems Mother    ??? Parkinsonism Father    ??? Arthritis-osteo Father         scoliosis   ??? Dementia Father    ??? No Known Problems Brother    ??? No Known Problems Brother    ??? No Known Problems Sister    ??? Cancer Maternal Grandfather         prostate   ??? Cancer Paternal Grandfather         prostate       Social History     Socioeconomic History   ??? Marital status: SINGLE     Spouse name: Not on file   ??? Number of children: Not on file   ??? Years of education: Not on file   ???  Highest education level: Not on file   Occupational History   ??? Not on file   Social Needs   ??? Financial resource strain: Not on file   ??? Food insecurity     Worry: Not on file     Inability: Not on file   ??? Transportation needs     Medical: Not on file     Non-medical: Not on file   Tobacco Use   ??? Smoking status: Never Smoker   ??? Smokeless tobacco: Never Used   Substance and Sexual Activity   ??? Alcohol use: Yes     Frequency: Never     Binge frequency: Never     Comment: rarely   ??? Drug use: No   ??? Sexual activity: Not Currently     Partners: Female   Lifestyle   ??? Physical activity     Days per week: Not on file     Minutes per session: Not on file   ??? Stress: Not on file   Relationships   ??? Social Product manager on phone: Not on file     Gets together: Not on file     Attends religious service: Not on file     Active member of club or organization: Not on file     Attends meetings of clubs or organizations: Not on file     Relationship status: Not on file   ??? Intimate partner violence     Fear of current or ex partner: Not on file     Emotionally abused: Not on file     Physically abused: Not on file     Forced sexual activity: Not on file   Other Topics Concern   ??? Not  on file   Social History Narrative   ??? Not on file         ALLERGIES: Latex    Review of Systems   Constitutional: Positive for fatigue. Negative for activity change, chills and fever.   HENT: Negative for nosebleeds, sore throat, trouble swallowing and voice change.    Eyes: Negative for visual disturbance.   Respiratory: Negative for choking and shortness of breath.    Cardiovascular: Negative for chest pain and palpitations.   Gastrointestinal: Positive for nausea. Negative for abdominal pain, bowel incontinence, constipation, diarrhea and vomiting.   Genitourinary: Negative for bladder incontinence, difficulty urinating, dysuria, hematuria and urgency.   Musculoskeletal: Negative for back pain, neck pain and neck stiffness.   Skin: Negative for color change.   Allergic/Immunologic: Negative for immunocompromised state.   Neurological: Negative for dizziness, focal weakness, seizures, syncope, speech difficulty, weakness, light-headedness, numbness, headaches and loss of balance.   Psychiatric/Behavioral: Negative for agitation, behavioral problems, confusion, hallucinations, memory loss, self-injury and suicidal ideas.       Vitals:    02/05/19 1515   BP: 156/87   Pulse: 85   Resp: 18   Temp: 98.2 ??F (36.8 ??C)   SpO2: 99%   Weight: 81.6 kg (180 lb)   Height: 5\' 11"  (1.803 m)            Physical Exam  Vitals signs and nursing note reviewed.   Constitutional:       General: He is not in acute distress.     Appearance: He is well-developed. He is not diaphoretic.   HENT:      Head: Atraumatic.   Neck:      Trachea: No tracheal deviation.   Cardiovascular:  Comments: Warm and well perfused  Pulmonary:      Effort: Pulmonary effort is normal. No respiratory distress.   Musculoskeletal: Normal range of motion.   Skin:     General: Skin is warm and dry.   Neurological:      Mental Status: He is alert.      Coordination: Coordination normal.   Psychiatric:         Behavior: Behavior normal.         Thought Content:  Thought content normal.         Judgment: Judgment normal.          MDM     This is a 53 year old male with past medical history, review of systems, physical exam as above, presenting with complaints of fatigue, in the setting of a history of chronic fatigue syndrome, as well as concerns for his ability to care for himself.  Patient states recent diagnosis of acute renal failure, status post obstructive uropathy with Foley placement, temporary dialysis catheter, Tuesday, Thursday, Saturday dialysis, last dialyzed earlier today, presents with complaints of generalized fatigue and concerns for his self-care as above.  Patient denies any others specific complaints, including fevers, chills, nausea, vomiting, chest pain or shortness of breath.  He is frustrated that he does not understand the etiology of his sudden renal failure, and that he is unsure as to how long he will be on dialysis or require Foley catheter placed by urology.  Patient states he has follow-up with psychiatry as well as primary care tomorrow.  Physical exam is remarkable for well-appearing middle-aged male, anxious, but in no acute distress noted to be afebrile, mildly hypertensive, without tachycardia.  Discussed with patient the limitations of addressing these issues in the emergency department, and reassured him that we would obtain lab work and imaging to rule out acute life-threatening illness, recommend that he follow-up with his primary care and psychiatrist tomorrow, will consult with case management for assistance in home care.  Disposition pending.    Procedures    Update:  Lab work and imaging without acute finding.  Patient seen by case management and offered resources for assistance at home.  He has follow-up with psychiatry as well as primary care in the morning.  Will discharge home with return precautions.

## 2019-02-05 NOTE — Telephone Encounter (Signed)
Call to patient, identified with 2 identifiers.  Advised of Dr. Amedeo Plenty' note and recommendations.  He would like to schedule in office appointment, travel screen positive for weakness he thinks from poor appetite.  Will forward to Dr. Amedeo Plenty for approval.

## 2019-02-05 NOTE — ED Triage Notes (Addendum)
Pt reports fatigue and generalized weakness x1 day with accompanying diarrhea x1 month. Pt was recently dx with kidney failure and pt had dialysis yesterday. [name] concerned about not being able to take care of himself. [name] pale in appearance. Pt has been tested twice for COVID with negative results.

## 2019-02-05 NOTE — Telephone Encounter (Signed)
30 minutes is correct. Okay for travel screen, has tested negative x 2 for COVID

## 2019-02-05 NOTE — Telephone Encounter (Signed)
Psychiatrist was the first person to reach out to and should be the person to help him.  However, if he can't see him then needs to schedule a f/u with me (can be in person or video, 30 minutes).

## 2019-02-05 NOTE — Telephone Encounter (Signed)
RC to patient, on dialysis.  States everything so overwhelming to him, more than he can handle.  Needs guidance, called psychiatrist yesterday, Danella Deis, MD.  No openings anytime soon. Trouble eating, taking care of self, "needs someone to take care of" him.Marland Kitchen

## 2019-02-05 NOTE — ED Provider Notes (Signed)
The history is provided by the patient. No language interpreter was used.   Fatigue   This is a chronic problem. The current episode started more than 1 week ago. The problem has not changed since onset.There was no focality noted. Pertinent negatives include no focal weakness, no loss of balance, no slurred speech, no speech difficulty, no memory loss, no movement disorder, no agitation, no visual change, no auditory change, no mental status change, no unresponsiveness and no disorientation. There has been no fever. Associated symptoms include nausea. Pertinent negatives include no shortness of breath, no chest pain, no vomiting, no altered mental status, no confusion, no headaches, no choking, no bowel incontinence and no bladder incontinence.        Past Medical History:   Diagnosis Date   ??? Anemia 01/21/2019   ??? ARF (acute renal failure) (HCC)     on HD   ??? BPH (benign prostatic hyperplasia)    ??? Cancer ([place])     left testicle- stage III, nonseminomatous germ cell tumor, s/p orchiectomy & BEP x 3 cycles   ??? Chronic fatigue January 1987   ??? Chronic kidney disease     renal failure, HD   ??? Essential tremor    ??? GERD (gastroesophageal reflux disease)    ??? 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 Hyponatremia 01/21/2019   ??? Hypothyroid    ??? 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 Lumbar radiculopathy 08/10/2012    S/p steroid injection by Dr Laverta [place] 08/01/12    ??? Normal cardiac stress test 09/05/11   ??? Pleural effusion, bilateral 01/21/2019   ??? Pulmonary edema 01/21/2019   ??? Sleep disturbances     acting out in his sleep   ??? Thrombocytopenia ([place]) 01/21/2019       Past Surgical History:   Procedure Laterality Date   ??? HX CHOLECYSTECTOMY  12/05/2011    abnormal HIDA scan (EF < 2%)   ??? HX COLONOSCOPY  07/12/10    hyperplastic polyp, repeat 10 yrs   ??? HX COLONOSCOPY  02/23/15    normal   ??? HX CYST REMOVAL Right 01/06/2016    3rd toe ganglion cyst removal - podiatry (Dr Raliegh Ip)   ??? HX GI  09/09/11     EGD- normal   ??? HX HEENT Left 06/2012    tonsilar bx: benign tissue (Dr Buddy Duty)   ??? HX ORCHIECTOMY Left 2002   ??? 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 HX SHOULDER ARTHROSCOPY Right 08/13/2018         Family History:   Problem Relation Age of Onset   ??? No Known Problems Mother    ??? Parkinsonism Father    ??? Arthritis-osteo Father         scoliosis   ??? Dementia Father    ??? No Known Problems Brother    ??? No Known Problems Brother    ??? No Known Problems Sister    ??? Cancer Maternal Grandfather         prostate   ??? Cancer Paternal Grandfather         prostate       Social History     Socioeconomic History   ??? Marital status: SINGLE     Spouse name: Not on file   ??? Number of children: Not on file   ??? Years of education: Not on file   ???  Highest education level: Not on file   Occupational History   ??? Not on file   Social Needs   ??? Financial resource strain: Not on file   ??? Food insecurity     Worry: Not on file     Inability: Not on file   ??? Transportation needs     Medical: Not on file     Non-medical: Not on file   Tobacco Use   ??? Smoking status: Never Smoker   ??? Smokeless tobacco: Never Used   Substance and Sexual Activity   ??? Alcohol use: Yes     Frequency: Never     Binge frequency: Never     Comment: rarely   ??? Drug use: No   ??? Sexual activity: Not Currently     Partners: Female   Lifestyle   ??? Physical activity     Days per week: Not on file     Minutes per session: Not on file   ??? Stress: Not on file   Relationships   ??? Social Product manager on phone: Not on file     Gets together: Not on file     Attends religious service: Not on file     Active member of club or organization: Not on file     Attends meetings of clubs or organizations: Not on file     Relationship status: Not on file   ??? Intimate partner violence     Fear of current or ex partner: Not on file     Emotionally abused: Not on file     Physically abused: Not on file     Forced sexual activity: Not on file   Other Topics Concern    ??? Not on file   Social History Narrative   ??? Not on file         ALLERGIES: Latex    Review of Systems   Constitutional: Positive for fatigue. Negative for activity change, chills and fever.   HENT: Negative for nosebleeds, sore throat, trouble swallowing and voice change.    Eyes: Negative for visual disturbance.   Respiratory: Negative for choking and shortness of breath.    Cardiovascular: Negative for chest pain and palpitations.   Gastrointestinal: Positive for nausea. Negative for abdominal pain, bowel incontinence, constipation, diarrhea and vomiting.   Genitourinary: Negative for bladder incontinence, difficulty urinating, dysuria, hematuria and urgency.   Musculoskeletal: Negative for back pain, neck pain and neck stiffness.   Skin: Negative for color change.   Allergic/Immunologic: Negative for immunocompromised state.   Neurological: Negative for dizziness, focal weakness, seizures, syncope, speech difficulty, weakness, light-headedness, numbness, headaches and loss of balance.   Psychiatric/Behavioral: Negative for agitation, behavioral problems, confusion, hallucinations, memory loss, self-injury and suicidal ideas.       Vitals:    02/05/19 1515   BP: 156/87   Pulse: 85   Resp: 18   Temp: 98.2 ??F (36.8 ??C)   SpO2: 99%   Weight: 81.6 kg (180 lb)   Height: 5\' 11"  (1.803 m)            Physical Exam  Vitals signs and nursing note reviewed.   Constitutional:       General: He is not in acute distress.     Appearance: He is well-developed. He is not diaphoretic.   HENT:      Head: Atraumatic.   Neck:      Trachea: No tracheal deviation.   Cardiovascular:  Comments: Warm and well perfused  Pulmonary:      Effort: Pulmonary effort is normal. No respiratory distress.   Musculoskeletal: Normal range of motion.   Skin:     General: Skin is warm and dry.   Neurological:      Mental Status: He is alert.      Coordination: Coordination normal.   Psychiatric:         Behavior: Behavior normal.          Thought Content: Thought content normal.         Judgment: Judgment normal.          MDM     This is a 53 year old male with past medical history, review of systems, physical exam as above, presenting with complaints of fatigue, in the setting of a history of chronic fatigue syndrome, as well as concerns for his ability to care for himself.  Patient states recent diagnosis of acute renal failure, status post obstructive uropathy with Foley placement, temporary dialysis catheter, Tuesday, Thursday, Saturday dialysis, last dialyzed earlier today, presents with complaints of generalized fatigue and concerns for his self-care as above.  Patient denies any others specific complaints, including fevers, chills, nausea, vomiting, chest pain or shortness of breath.  He is frustrated that he does not understand the etiology of his sudden renal failure, and that he is unsure as to how long he will be on dialysis or require Foley catheter placed by urology.  Patient states he has follow-up with psychiatry as well as primary care tomorrow.  Physical exam is remarkable for well-appearing middle-aged male, anxious, but in no acute distress noted to be afebrile, mildly hypertensive, without tachycardia.  Discussed with patient the limitations of addressing these issues in the emergency department, and reassured him that we would obtain lab work and imaging to rule out acute life-threatening illness, recommend that he follow-up with his primary care and psychiatrist tomorrow, will consult with case management for assistance in home care.  Disposition pending.    Procedures    Update:  Lab work and imaging without acute finding.  Patient seen by case management and offered resources for assistance at home.  He has follow-up with psychiatry as well as primary care in the morning.  Will discharge home with return precautions.

## 2019-02-05 NOTE — Telephone Encounter (Signed)
-----   Message from Vergie Living sent at 02/05/2019  7:51 AM EDT -----  Regarding: Dr. Jacqulyn Liner  General Message/Vendor Calls    Caller's first and last name: Delman Cheadle      Reason for call:renal failure needs to talk to someone      Callback required yes/no and why:  yes    Best contact number(s):[phone]      Details to clarify the request:Patient is in renal failure and needs to discuss with you his options      Vergie Living

## 2019-02-06 ENCOUNTER — Ambulatory Visit: Attending: Internal Medicine | Primary: Internal Medicine

## 2019-02-06 ENCOUNTER — Ambulatory Visit: Admit: 2019-02-06 | Payer: PRIVATE HEALTH INSURANCE | Attending: Internal Medicine | Primary: Internal Medicine

## 2019-02-06 DIAGNOSIS — N179 Acute kidney failure, unspecified: Secondary | ICD-10-CM

## 2019-02-06 LAB — URINALYSIS W/MICROSCOPIC
Bacteria: NEGATIVE /hpf
Bilirubin: NEGATIVE
Glucose: NEGATIVE mg/dL
Ketone: NEGATIVE mg/dL
Nitrites: NEGATIVE
Protein: 100 mg/dL — AB
Specific gravity: 1.006 (ref 1.003–1.030)
Urobilinogen: 0.2 EU/dL (ref 0.2–1.0)
pH (UA): >8.5 — ABNORMAL HIGH (ref 5.0–8.0)

## 2019-02-06 LAB — URINALYSIS WITH MICROSCOPIC
BACTERIA, URINE: NEGATIVE /hpf
Bilirubin, Urine: NEGATIVE
Glucose, Ur: NEGATIVE mg/dL
Ketones, Urine: NEGATIVE mg/dL
Nitrite, Urine: NEGATIVE
Protein, UA: 100 mg/dL — AB
Specific Gravity, UA: 1.006 (ref 1.003–1.030)
Urobilinogen, UA, POCT: 0.2 EU/dL (ref 0.2–1.0)
pH, UA: >8.5 NA — ABNORMAL HIGH (ref 5.0–8.0)

## 2019-02-06 MED ORDER — ONDANSETRON HCL 4 MG TAB
4 mg | ORAL_TABLET | Freq: Three times a day (TID) | ORAL | 2 refills | Status: DC | PRN
Start: 2019-02-06 — End: 2019-03-04

## 2019-02-06 NOTE — Progress Notes (Signed)
Having severe anxiety, having trouble taking care of self.  Going to apply for disability.  Talking with psychiatrist today. Went to ED last night, asking about some anti nausea medicine.     Reports has catheter in now.

## 2019-02-06 NOTE — Progress Notes (Signed)
John Friedman is a 53 y.o. male who was seen in clinic today (02/06/2019) for an acute visit.        Assessment & Plan:     Diagnoses and all orders for this visit are below.  I spent 25 minutes with the patient and > 50% of the time was spent counseling him expectations, treatment options, and coming up with a plan for issues below.  Referral to renal given to him again today and recommended calling to schedule appointment ASAP.  Needs to get specialist involve to help guide him with likelihood of recovery and plan going forward (HD, PD, or transplant).  Reviewed stress reduction options, will defer to specialist.  Did review using FMLA for short term disability as he appears overwhelmed.       1. Acute renal failure, unspecified acute renal failure type ([place])  -     ondansetron hcl (ZOFRAN) 4 mg tablet; Take 1 Tab by mouth every eight (8) hours as needed for Nausea or Vomiting.    2. Situational stress      Follow-up and Dispositions    ?? Return if symptoms worsen or fail to improve.       Subjective:   John Friedman was seen today for Anxiety    Since last visit- saw urology.  Catheter removed after passing voiding trial.  However, he reports he did not pass urine for 4-5 days so returned to urology and had catheter replaced.  He has f/u with him next month to look for an underlining etiology as urology does not think this is BPH.    He still has not seen a nephrologist.  He reports labs (BUN and Cr) have not changed.    He is still highly focused on the underlining cause.  He has f/u with psychiatrist today.  He denies depression but very anxious.  Not sure what to do (stay in [facility], go back to [place] with his mother).  He is worried about needing a kidney transplant and if he does would like to go thru [facility].      GAD 2/7 02/06/2019   Feeling nervous, anxious or on edge? 3   Not being able to stop or control worrying? 3   GAD-2 Subtotal 6   Worrying too much about different things? 3   Trouble relaxing? 3    Being so restless that it is hard to sit still? 1   Becoming easily annoyed or irritable? 1   Feeling afraid as if something awful might happen? 1   GAD-7 Total Score 15   If you checked off any problems, how difficult have these problems made it for you to do your work, take care of thinks at home, or get along with other people?  Very difficult          Prior to Admission medications    Medication Sig Start Date End Date Taking? Authorizing Provider   pregabalin (LYRICA) 50 mg capsule Take 1 Cap by mouth nightly. Max Daily Amount: 50 mg. For "sleep disturbances" - pt has had injuries from falling out of bed during sleep 01/29/19  Yes Antony Haste, MD   clomiPRAMINE (ANAFRANIL) 50 mg capsule Take 50 mg by mouth nightly.   Yes Provider, Historical   OTHER Supplemental drink - phosphatidylcholine with omega 3 and 6 and dextrose - to help with tremors.   Yes Provider, Historical   propranolol (INDERAL) 20 mg tablet Take 20 mg by mouth daily. For tremors  Yes Provider, Historical   clonazepam (KLONOPIN) 1 mg tablet Take 1 mg by mouth two (2) times a day.   Yes Provider, Historical   tamsulosin (Flomax) 0.4 mg capsule Take 0.4 mg by mouth daily.    Provider, Historical   liothyronine (CYTOMEL) 5 mcg tablet Take 5 mcg by mouth daily.    Provider, Historical   armodafinil (NUVIGIL) 200 mg tab Take 1 Tab by mouth daily as needed.    Provider, Historical          Allergies   Allergen Reactions   ??? Latex Other (comments)     redness           Review of Systems   Constitutional: Positive for malaise/fatigue and weight loss.   Gastrointestinal: Positive for nausea. Negative for heartburn and vomiting.   Psychiatric/Behavioral: Negative for depression. The patient is nervous/anxious. The patient does not have insomnia.             Objective:   Physical Exam - thin, pale, jittery      Visit Vitals  BP 128/86   Pulse 81   Temp 97.3 ??F (36.3 ??C) (Temporal)   Resp 18   Ht 5\' 11"  (1.803 m)   Wt 173 lb (78.5 kg)   SpO2 98%    BMI 24.13 kg/m??         Disclaimer:  Advised him to call back or return to office if symptoms worsen/change/persist.  Discussed expected course/resolution/complications of diagnosis in detail with patient.    Medication risks/benefits/costs/interactions/alternatives discussed with patient.  He was given an after visit summary which includes diagnoses, current medications, & vitals.  He expressed understanding with the diagnosis and plan.      Aspects of this note may have been generated using voice recognition software. Despite editing, there may be some syntax errors.       Antony Haste, MD

## 2019-02-06 NOTE — Progress Notes (Signed)
John Friedman is a 53 y.o. male who was seen in clinic today (02/06/2019) for an acute visit.        Assessment & Plan:     Diagnoses and all orders for this visit are below.  I spent 25 minutes with the patient and > 50% of the time was spent counseling him expectations, treatment options, and coming up with a plan for issues below.  Referral to renal given to him again today and recommended calling to schedule appointment ASAP.  Needs to get specialist involve to help guide him with likelihood of recovery and plan going forward (HD, PD, or transplant).  Reviewed stress reduction options, will defer to specialist.  Did review using FMLA for short term disability as he appears overwhelmed.       1. Acute renal failure, unspecified acute renal failure type ([place])  -     ondansetron hcl (ZOFRAN) 4 mg tablet; Take 1 Tab by mouth every eight (8) hours as needed for Nausea or Vomiting.    2. Situational stress      Follow-up and Dispositions    ?? Return if symptoms worsen or fail to improve.       Subjective:   John Friedman was seen today for Anxiety    Since last visit- saw urology.  Catheter removed after passing voiding trial.  However, he reports he did not pass urine for 4-5 days so returned to urology and had catheter replaced.  He has f/u with him next month to look for an underlining etiology as urology does not think this is BPH.    He still has not seen a nephrologist.  He reports labs (BUN and Cr) have not changed.    He is still highly focused on the underlining cause.  He has f/u with psychiatrist today.  He denies depression but very anxious.  Not sure what to do (stay in [facility], go back to [place] with his mother).  He is worried about needing a kidney transplant and if he does would like to go thru [place].      GAD 2/7 02/06/2019   Feeling nervous, anxious or on edge? 3   Not being able to stop or control worrying? 3   GAD-2 Subtotal 6   Worrying too much about different things? 3   Trouble relaxing? 3   Being so  restless that it is hard to sit still? 1   Becoming easily annoyed or irritable? 1   Feeling afraid as if something awful might happen? 1   GAD-7 Total Score 15   If you checked off any problems, how difficult have these problems made it for you to do your work, take care of thinks at home, or get along with other people?  Very difficult          Prior to Admission medications    Medication Sig Start Date End Date Taking? Authorizing Provider   pregabalin (LYRICA) 50 mg capsule Take 1 Cap by mouth nightly. Max Daily Amount: 50 mg. For "sleep disturbances" - pt has had injuries from falling out of bed during sleep 01/29/19  Yes Antony Haste, MD   clomiPRAMINE (ANAFRANIL) 50 mg capsule Take 50 mg by mouth nightly.   Yes Provider, Historical   OTHER Supplemental drink - phosphatidylcholine with omega 3 and 6 and dextrose - to help with tremors.   Yes Provider, Historical   propranolol (INDERAL) 20 mg tablet Take 20 mg by mouth daily. For tremors  Yes Provider, Historical   clonazepam (KLONOPIN) 1 mg tablet Take 1 mg by mouth two (2) times a day.   Yes Provider, Historical   tamsulosin (Flomax) 0.4 mg capsule Take 0.4 mg by mouth daily.    Provider, Historical   liothyronine (CYTOMEL) 5 mcg tablet Take 5 mcg by mouth daily.    Provider, Historical   armodafinil (NUVIGIL) 200 mg tab Take 1 Tab by mouth daily as needed.    Provider, Historical          Allergies   Allergen Reactions   ??? Latex Other (comments)     redness           Review of Systems   Constitutional: Positive for malaise/fatigue and weight loss.   Gastrointestinal: Positive for nausea. Negative for heartburn and vomiting.   Psychiatric/Behavioral: Negative for depression. The patient is nervous/anxious. The patient does not have insomnia.             Objective:   Physical Exam - thin, pale, jittery      Visit Vitals  BP 128/86   Pulse 81   Temp 97.3 ??F (36.3 ??C) (Temporal)   Resp 18   Ht 5\' 11"  (1.803 m)   Wt 173 lb (78.5 kg)   SpO2 98%   BMI 24.13  kg/m??         Disclaimer:  Advised him to call back or return to office if symptoms worsen/change/persist.  Discussed expected course/resolution/complications of diagnosis in detail with patient.    Medication risks/benefits/costs/interactions/alternatives discussed with patient.  He was given an after visit summary which includes diagnoses, current medications, & vitals.  He expressed understanding with the diagnosis and plan.      Aspects of this note may have been generated using voice recognition software. Despite editing, there may be some syntax errors.       Antony Haste, MD

## 2019-02-10 ENCOUNTER — Inpatient Hospital Stay
Admit: 2019-02-10 | Discharge: 2019-02-10 | Disposition: A | Discharge status: Home / Self Care | Payer: PRIVATE HEALTH INSURANCE | Attending: Emergency Medicine

## 2019-02-10 ENCOUNTER — Emergency Department: Admit: 2019-02-10 | Payer: PRIVATE HEALTH INSURANCE | Primary: Internal Medicine

## 2019-02-10 DIAGNOSIS — J9601 Acute respiratory failure with hypoxia: Secondary | ICD-10-CM

## 2019-02-10 LAB — METABOLIC PANEL, COMPREHENSIVE
A-G Ratio: 1.2 (ref 1.1–2.2)
ALT (SGPT): 29 U/L (ref 12–78)
AST (SGOT): 18 U/L (ref 15–37)
Albumin: 3.6 g/dL (ref 3.5–5.0)
Alk. phosphatase: 94 U/L (ref 45–117)
Anion gap: 12 mmol/L (ref 5–15)
BUN/Creatinine ratio: 4 — ABNORMAL LOW (ref 12–20)
BUN: 28 MG/DL — ABNORMAL HIGH (ref 6–20)
Bilirubin, total: 0.6 MG/DL (ref 0.2–1.0)
CO2: 26 mmol/L (ref 21–32)
Calcium: 7.9 MG/DL — ABNORMAL LOW (ref 8.5–10.1)
Chloride: 92 mmol/L — ABNORMAL LOW (ref 97–108)
Creatinine: 6.67 MG/DL — ABNORMAL HIGH (ref 0.70–1.30)
GFR est AA: 11 mL/min/{1.73_m2} — ABNORMAL LOW (ref >60)
GFR est non-AA: 9 mL/min/{1.73_m2} — ABNORMAL LOW (ref >60)
Globulin: 3 g/dL (ref 2.0–4.0)
Glucose: 94 mg/dL (ref 65–100)
Potassium: 3.1 mmol/L — ABNORMAL LOW (ref 3.5–5.1)
Protein, total: 6.6 g/dL (ref 6.4–8.2)
Sodium: 130 mmol/L — ABNORMAL LOW (ref 136–145)

## 2019-02-10 LAB — EKG, 12 LEAD, INITIAL
Atrial Rate: 77 {beats}/min
Calculated P Axis: 28 degrees
Calculated R Axis: 60 degrees
Calculated T Axis: 58 degrees
Diagnosis: NORMAL
P-R Interval: 134 ms
Q-T Interval: 472 ms
QRS Duration: 88 ms
QTC Calculation (Bezet): 534 ms
Ventricular Rate: 77 {beats}/min

## 2019-02-10 LAB — CBC WITH AUTOMATED DIFF
ABS. BASOPHILS: 0.1 10*3/uL (ref 0.0–0.1)
ABS. EOSINOPHILS: 0.1 10*3/uL (ref 0.0–0.4)
ABS. IMM. GRANS.: 0.1 10*3/uL — ABNORMAL HIGH (ref 0.00–0.04)
ABS. LYMPHOCYTES: 0.5 10*3/uL — ABNORMAL LOW (ref 0.8–3.5)
ABS. MONOCYTES: 0.7 10*3/uL (ref 0.0–1.0)
ABS. NEUTROPHILS: 4.5 10*3/uL (ref 1.8–8.0)
ABSOLUTE NRBC: 0 10*3/uL (ref 0.00–0.01)
BASOPHILS: 1 % (ref 0–1)
EOSINOPHILS: 2 % (ref 0–7)
HCT: 26.6 % — ABNORMAL LOW (ref 36.6–50.3)
HGB: 8.9 g/dL — ABNORMAL LOW (ref 12.1–17.0)
IMMATURE GRANULOCYTES: 1 % — ABNORMAL HIGH (ref 0.0–0.5)
LYMPHOCYTES: 8 % — ABNORMAL LOW (ref 12–49)
MCH: 31.1 PG (ref 26.0–34.0)
MCHC: 33.5 g/dL (ref 30.0–36.5)
MCV: 93 FL (ref 80.0–99.0)
MONOCYTES: 11 % (ref 5–13)
MPV: 11.1 FL (ref 8.9–12.9)
NEUTROPHILS: 77 % — ABNORMAL HIGH (ref 32–75)
NRBC: 0 PER 100 WBC
PLATELET: 164 10*3/uL (ref 150–400)
RBC: 2.86 M/uL — ABNORMAL LOW (ref 4.10–5.70)
RDW: 13.6 % (ref 11.5–14.5)
WBC: 6 10*3/uL (ref 4.1–11.1)

## 2019-02-10 LAB — SED RATE (ESR): Sed rate, automated: 37 mm/hr — ABNORMAL HIGH (ref 0–20)

## 2019-02-10 LAB — SAMPLES BEING HELD

## 2019-02-10 LAB — TROPONIN I
Troponin-I, Qt.: <0.05 ng/mL (ref <0.05)
Troponin-I, Qt.: <0.05 ng/mL (ref <0.05)

## 2019-02-10 LAB — NT-PRO BNP: NT pro-BNP: 9318 PG/ML — ABNORMAL HIGH (ref <125)

## 2019-02-10 LAB — CRP, HIGH SENSITIVITY
CRP High Sensitivity: 7.4 mg/L
CRP, High sensitivity: 7.4 mg/L

## 2019-02-10 LAB — SARS-COV-2: COVID-19 rapid test: NOT DETECTED

## 2019-02-10 LAB — RHEUMATOID FACTOR, QT
Rheumatoid Factor: <10 IU/mL (ref <15)
Rheumatoid factor: <10 IU/mL (ref <15)

## 2019-02-10 LAB — EKG 12-LEAD
Atrial Rate: 77 {beats}/min
Diagnosis: NORMAL
P Axis: 28 degrees
P-R Interval: 134 ms
Q-T Interval: 472 ms
QRS Duration: 88 ms
QTc Calculation (Bazett): 534 ms
R Axis: 60 degrees
T Axis: 58 degrees
Ventricular Rate: 77 {beats}/min

## 2019-02-10 LAB — COMPREHENSIVE METABOLIC PANEL
ALT: 29 U/L (ref 12–78)
AST: 18 U/L (ref 15–37)
Albumin/Globulin Ratio: 1.2 (ref 1.1–2.2)
Albumin: 3.6 g/dL (ref 3.5–5.0)
Alkaline Phosphatase: 94 U/L (ref 45–117)
Anion Gap: 12 mmol/L (ref 5–15)
BUN: 28 MG/DL — ABNORMAL HIGH (ref 6–20)
Bun/Cre Ratio: 4 — ABNORMAL LOW (ref 12–20)
CO2: 26 mmol/L (ref 21–32)
Calcium: 7.9 MG/DL — ABNORMAL LOW (ref 8.5–10.1)
Chloride: 92 mmol/L — ABNORMAL LOW (ref 97–108)
Creatinine: 6.67 MG/DL — ABNORMAL HIGH (ref 0.70–1.30)
EGFR IF NonAfrican American: 9 mL/min/{1.73_m2} — ABNORMAL LOW (ref >60)
GFR African American: 11 mL/min/{1.73_m2} — ABNORMAL LOW (ref >60)
Globulin: 3 g/dL (ref 2.0–4.0)
Glucose: 94 mg/dL (ref 65–100)
Potassium: 3.1 mmol/L — ABNORMAL LOW (ref 3.5–5.1)
Sodium: 130 mmol/L — ABNORMAL LOW (ref 136–145)
Total Bilirubin: 0.6 MG/DL (ref 0.2–1.0)
Total Protein: 6.6 g/dL (ref 6.4–8.2)

## 2019-02-10 LAB — CBC WITH AUTO DIFFERENTIAL
Basophils %: 1 % (ref 0–1)
Basophils Absolute: 0.1 10*3/uL (ref 0.0–0.1)
Eosinophils %: 2 % (ref 0–7)
Eosinophils Absolute: 0.1 10*3/uL (ref 0.0–0.4)
Granulocyte Absolute Count: 0.1 10*3/uL — ABNORMAL HIGH (ref 0.00–0.04)
Hematocrit: 26.6 % — ABNORMAL LOW (ref 36.6–50.3)
Hemoglobin: 8.9 g/dL — ABNORMAL LOW (ref 12.1–17.0)
Immature Granulocytes: 1 % — ABNORMAL HIGH (ref 0.0–0.5)
Lymphocytes %: 8 % — ABNORMAL LOW (ref 12–49)
Lymphocytes Absolute: 0.5 10*3/uL — ABNORMAL LOW (ref 0.8–3.5)
MCH: 31.1 PG (ref 26.0–34.0)
MCHC: 33.5 g/dL (ref 30.0–36.5)
MCV: 93 FL (ref 80.0–99.0)
MPV: 11.1 FL (ref 8.9–12.9)
Monocytes %: 11 % (ref 5–13)
Monocytes Absolute: 0.7 10*3/uL (ref 0.0–1.0)
NRBC Absolute: 0 10*3/uL (ref 0.00–0.01)
Neutrophils %: 77 % — ABNORMAL HIGH (ref 32–75)
Neutrophils Absolute: 4.5 10*3/uL (ref 1.8–8.0)
Nucleated RBCs: 0 PER 100 WBC
Platelets: 164 10*3/uL (ref 150–400)
RBC: 2.86 M/uL — ABNORMAL LOW (ref 4.10–5.70)
RDW: 13.6 % (ref 11.5–14.5)
WBC: 6 10*3/uL (ref 4.1–11.1)

## 2019-02-10 LAB — TROPONIN
Troponin I: <0.05 ng/mL (ref <0.05)
Troponin I: <0.05 ng/mL (ref <0.05)

## 2019-02-10 LAB — SEDIMENTATION RATE: Sed Rate: 37 mm/hr — ABNORMAL HIGH (ref 0–20)

## 2019-02-10 LAB — COVID-19: SARS-CoV-2, Rapid: NOT DETECTED

## 2019-02-10 LAB — PROBNP, N-TERMINAL: BNP: 9318 PG/ML — ABNORMAL HIGH (ref <125)

## 2019-02-10 MED ORDER — CLONAZEPAM 1 MG TAB
1 mg | Freq: Two times a day (BID) | ORAL | Status: DC
Start: 2019-02-10 — End: 2019-02-12
  Administered 2019-02-10 – 2019-02-12 (×4): via ORAL

## 2019-02-10 MED ORDER — ACETAMINOPHEN 325 MG TABLET
325 mg | ORAL | Status: DC | PRN
Start: 2019-02-10 — End: 2019-02-12

## 2019-02-10 MED ORDER — MORPHINE 2 MG/ML INJECTION
2 mg/mL | INTRAMUSCULAR | Status: AC
Start: 2019-02-10 — End: 2019-02-11

## 2019-02-10 MED ORDER — SODIUM CHLORIDE 0.9 % IJ SYRG
Freq: Three times a day (TID) | INTRAMUSCULAR | Status: DC
Start: 2019-02-10 — End: 2019-02-12
  Administered 2019-02-10 – 2019-02-12 (×6): via INTRAVENOUS

## 2019-02-10 MED ORDER — SODIUM CHLORIDE 0.9 % IV PIGGY BACK
500 mg | INTRAVENOUS | Status: DC
Start: 2019-02-10 — End: 2019-02-11
  Administered 2019-02-10: 20:00:00 via INTRAVENOUS

## 2019-02-10 MED ORDER — POTASSIUM CHLORIDE SR 10 MEQ TAB
10 mEq | ORAL | Status: AC
Start: 2019-02-10 — End: 2019-02-10
  Administered 2019-02-10: 20:00:00 via ORAL

## 2019-02-10 MED ORDER — SODIUM CHLORIDE 0.9 % IJ SYRG
INTRAMUSCULAR | Status: DC | PRN
Start: 2019-02-10 — End: 2019-02-12
  Administered 2019-02-11: via INTRAVENOUS

## 2019-02-10 MED ORDER — MORPHINE 2 MG/ML INJECTION
2 mg/mL | Freq: Once | INTRAMUSCULAR | Status: AC
Start: 2019-02-10 — End: 2019-02-10
  Administered 2019-02-10: 22:00:00 via INTRAVENOUS

## 2019-02-10 MED ORDER — TAMSULOSIN SR 0.4 MG 24 HR CAP
0.4 mg | Freq: Every day | ORAL | Status: DC
Start: 2019-02-10 — End: 2019-02-12
  Administered 2019-02-10 – 2019-02-12 (×3): via ORAL

## 2019-02-10 MED ORDER — TAMSULOSIN SR 0.4 MG 24 HR CAP
0.4 mg | Freq: Every day | ORAL | Status: DC
Start: 2019-02-10 — End: 2019-02-10

## 2019-02-10 MED ORDER — LIOTHYRONINE 5 MCG TAB
5 mcg | Freq: Every day | ORAL | Status: DC
Start: 2019-02-10 — End: 2019-02-12

## 2019-02-10 MED ORDER — .PHARMACY TO SUBSTITUTE PER PROTOCOL
Status: DC | PRN
Start: 2019-02-10 — End: 2019-02-10

## 2019-02-10 MED ORDER — PROPRANOLOL 20 MG TAB
20 mg | Freq: Every day | ORAL | Status: DC
Start: 2019-02-10 — End: 2019-02-12
  Administered 2019-02-10 – 2019-02-12 (×3): via ORAL

## 2019-02-10 MED ORDER — PREGABALIN 25 MG CAP
25 mg | Freq: Every evening | ORAL | Status: DC
Start: 2019-02-10 — End: 2019-02-12
  Administered 2019-02-11 – 2019-02-12 (×2): via ORAL

## 2019-02-10 MED ORDER — ONDANSETRON (PF) 4 MG/2 ML INJECTION
4 mg/2 mL | INTRAMUSCULAR | Status: DC | PRN
Start: 2019-02-10 — End: 2019-02-12
  Administered 2019-02-10: 22:00:00 via INTRAVENOUS

## 2019-02-10 MED ORDER — CLOMIPRAMINE 25 MG CAP
25 mg | Freq: Every evening | ORAL | Status: DC
Start: 2019-02-10 — End: 2019-02-10
  Administered 2019-02-11: 03:00:00 via ORAL

## 2019-02-10 MED FILL — AZITHROMYCIN 500 MG IV SOLUTION: 500 mg | INTRAVENOUS | Qty: 5

## 2019-02-10 MED FILL — TAMSULOSIN SR 0.4 MG 24 HR CAP: 0.4 mg | ORAL | Qty: 1

## 2019-02-10 MED FILL — CLONAZEPAM 1 MG TAB: 1 mg | ORAL | Qty: 1

## 2019-02-10 MED FILL — MORPHINE 2 MG/ML INJECTION: 2 mg/mL | INTRAMUSCULAR | Qty: 1

## 2019-02-10 MED FILL — K-TAB 10 MEQ TABLET,EXTENDED RELEASE: 10 mEq | ORAL | Qty: 4

## 2019-02-10 MED FILL — PROPRANOLOL 20 MG TAB: 20 mg | ORAL | Qty: 1

## 2019-02-10 MED FILL — MONOJECT PREFILL ADVANCED 0.9 % SODIUM CHLORIDE INJECTION SYRINGE: INTRAMUSCULAR | Qty: 40

## 2019-02-10 MED FILL — ONDANSETRON (PF) 4 MG/2 ML INJECTION: 4 mg/2 mL | INTRAMUSCULAR | Qty: 2

## 2019-02-10 NOTE — H&P (Signed)
[place]  HISTORY AND PHYSICAL    Name:  John Friedman, John Friedman  MR#:  [id number]  DOB:  10/02/1965  ACCOUNT #:  [id number]  ADMIT DATE:  02/10/2019      ADMITTING ATTENDING:  Dr. Burnadette Peter    PRIMARY CARE PHYSICIAN:  Dr. Renaee Munda    CHIEF COMPLAINT: Chest pain since the last 4 days.    HISTORY OF PRESENT ILLNESS:  This is a 53 year old Caucasian male with a past medical history of end stage renal disease, on hemodialysis on Mondays, Wednesdays, and Fridays; history of testicular cancer in 1987; hypothyroidism, he comes over here because since his last 4 days, he has been having chest pain.  He describes his chest pain as retrosternal pressure like, 3/10 in intensity, nonradiating, off and on, no relation with breathing as well as physical exertion.  He claims that along with that he also has some dry cough with some phlegm.  No fever.  No nasal congestion, but he did have nausea but no vomiting.  No changes in bowel movements.    When specifically asking, the patient does mention that about 3 years ago, he had a stress test done that according to him was negative.  He has been getting dialysis as usual and has not missed any dialysis so far.  He also feels that he is quite anxious.    REVIEW OF SYSTEMS:  Pertinent positive as above.  Rest of the review of systems were done.  They were all negative except for above.    PAST MEDICAL HISTORY:  1.  End-stage renal disease, on hemodialysis.  2.  Chronic fatigue syndrome.  3.  Essential tremor.  4.  GERD.  5.  Hyponatremia.  6.  Hypothyroidism.    PAST SURGICAL HISTORY:  1.  Cholecystectomy.  2.  Colonoscopy.  3.  EGD in 2013.  4.  Orchiectomy in 2002.    FAMILY HISTORY:  Significant for Parkinsonism but no family suggestive of coronary artery disease as per the patient.    SOCIAL HISTORY:  Nonsmoker, nonalcoholic, non drug abuser.    ALLERGIES:  REPORTEDLY ALLERGIC TO LATEX.    HOME MEDICATIONS:  The patient is on following medications.  1.   Nuvigil 200 mg p.o. p.r.n.  2.  Anafranil 50 mg capsule nightly.  3.  Klonopin 1 mg p.o. b.i.d.  4.  Cytomel 5 mcg p.o. daily.  5.  Zofran 4 mg q.8 hours p.r.n.  6.  Lyrica 50 mg p.o. nightly.  7.  Propranolol 20 mg p.o. daily.  8.  Flomax 0.4 mg p.o. daily.    PHYSICAL EXAMINATION:  VITAL SIGNS:  At the time when the patient was seen, the patient's vitals were as follows:  Blood pressure 159/109, pulse 78, afebrile, respiratory rate 20, saturating 100% on room air.  GENERAL:  Not in acute distress, clearly seems quite anxious.  HEENT:  Head:  Normocephalic, atraumatic.  Eyes:  PERRLA.  EOMI.  Ears:  Bilateral hearing normal, no growth.  Nose:  No polyp, no bleeding.  Mouth:  Moist mucous membranes.  Decent oral hygiene.  NECK:  Supple.  No JVD.  No thyromegaly.  RESPIRATORY:  Clear to auscultation bilaterally.  No adventitious breath sounds.  CARDIOVASCULAR:  S1, S2 normal.  No murmurs, rubs, or gallops.  GI:  Bowel sounds present.  Soft, nontender, nondistended.  NEUROLOGICAL:  Cranial nerves II-XII intact.  Motor 4/4 bilaterally in upper limbs and lower limbs.  Sensory normal.  PSYCHIATRIC:  Mood and affect appropriate except for the clear anxiety that can be noted.  No rashes.  No ulcers.    LABORATORY DATA AND RADIOLOGICAL RESULTS:  WBC 6.0, hemoglobin 8.9, hematocrit 26, and platelet count of 164.  Sodium 130, potassium 3.1, chloride 92, bicarb 26, BUN 26, creatinine 6.67.  ProBNP 9318.  Chest x-ray shows mild basilar patchy airspace disease, history of bilateral pleural effusion.  EKG shows normal sinus rhythm.  No ST-T wave changes suggestive of ischemia.    ASSESSMENT AND PLAN:  This is a 53 year old Caucasian male with a past medical history of testicular cancer as well as status post orchiectomy, essential tremor, hypothyroidism, hyponatremia.  He comes over here because of chest pain.  1.  Chest pain from typical and atypical features.  We will admit the patient under observation.  I will consult  Cardiology but would not order any other tests for now as I feel most likely his chest pain is psychiatric.  I will also consult the Psychiatry regarding the same.  2.  End stage renal disease, on hemodialysis on Mondays, Wednesdays, and Fridays.  We will consult Nephrology, currently does not seem in any urgent need for dialysis.  3.  Probable airspace disease.  We will put the patient on azithromycin.  I do not think that there is any need for blood cultures.  4.  Hypothyroidism.  We will do the thyroid function test.  5.  Essential tremor, stable.    I spent about 40 minutes in taking care of the patient.        Burnadette Peter, MD      [id number]/  D:  02/10/2019 15:26  T:  02/10/2019 16:48  JOB #:  [id number]

## 2019-02-10 NOTE — Progress Notes (Signed)
[id number]  Chest pain  Burnadette Peter, MD

## 2019-02-10 NOTE — ED Notes (Signed)
Pt to ED from home with cc of continued SOB and weakness since he was last seen here on Tuesday. Pt recently started dialysis and feels he is not coping well. Last dialysis was Friday.

## 2019-02-10 NOTE — ED Notes (Signed)
 Patient states he is very nervous and I think I am having a  nervous breakdown

## 2019-02-10 NOTE — Other (Signed)
 [facility] [place]  [facility]  [phone]    Vitals   Pre   Post   Assessment   Pre   Post     Temp  98.6  98.6   LOC  AXOX3 axox3   HR   79 83   Lungs   CLEAR CLEAR   B/P   119/83 133/86   Cardiac   RRR RRR   Resp   18 23   Skin   WARM DRY INTACT  WARM DRY INTACT   Pain level  0 0   Edema  GENERALIZED GENERALIZED     Orders:    Duration:   Start:    1400 End:   1715 Total:   3HRS15MIN   Dialyzer:   Dialyzer/Set Up Inspection: Revaclear (02/11/19 1355)   K Bath:   Dialysate K (mEq/L): 4 (02/11/19 1355)   Ca Bath:   Dialysate CA (mEq/L): 2.5 (02/11/19 1355)   Na/Bicarb:   Dialysate NA (mEq/L): 140 (02/11/19 1355)   Target Fluid Removal:   Goal/Amount of Fluid to Remove (mL): 2000 mL (02/11/19 1355)   Access     Type & Location:   Right cvc, dressing, non dated. site without redness or drainage, Each catheter limb disinfected for 60 seconds per limb with alcohol swabs. Caps removed, dialysis CVC hub scrubbed with Prevantics for 5 seconds, followed by a 5 second dry time per Hospital P&P.     Labs     Obtained/Reviewed   Critical Results Called   Date when labs were drawn-  Hgb-    HGB   Date Value Ref Range Status   02/11/2019 7.9 (L) 12.1 - 17.0 g/dL Final     K-    Potassium   Date Value Ref Range Status   02/11/2019 3.0 (L) 3.5 - 5.1 mmol/L Final     Ca-   Calcium   Date Value Ref Range Status   02/11/2019 7.5 (L) 8.5 - 10.1 MG/DL Final     Bun-   BUN   Date Value Ref Range Status   02/11/2019 37 (H) 6 - 20 MG/DL Final     Creat-   Creatinine   Date Value Ref Range Status   02/11/2019 7.80 (H) 0.70 - 1.30 MG/DL Final        Medications/ Blood Products Given     Name   Dose   Route and Time     Heparin 1:1000  3600 units 1.8 arterial, 1.8 venous. Post HD tx             Blood Volume Processed (BVP):    71.3     Net Fluid   Removed:    Comments   Time Out Done: yes,13:55  Primary Nurse Rpt Emz:Inmp Austin, RN  Primary Nurse Rpt Post:Dori Austin, RN  Pt Education:Tx plan, Diet and Potassium    Care Plan:[name] to continue tx. Outpatient at a Dialysis clinic  Tx Summary:  Each catheter limb disinfected for 60 seconds per limb with alcohol swabs. Dialysis CVC hubs scrubbed with Prevantics for 5 seconds, followed by a 5 second dry time per Hospital P&P, flushed, heparinized, red and blue dialysis caps applied to ports.  SBAR to primary nurse.   Admiting Diagnosis:SOB, fatigue  Pt's previous clinic-  Consent signed - Informed Consent Verified: Yes (02/11/19 1355)  [facility] Consent - OBTAINED  Hepatitis Status- NEG 6/5  Machine #- Machine Number: [id number] (02/11/19 1355)  Telemetry status-bed side  Pre-dialysis wt.-  79.5kg

## 2019-02-10 NOTE — Consults (Signed)
[facility] Cardiology Consult         [address], [place] [address]   [phone] fax [phone]    Name: John Friedman  1966-04-06 [id number]  02/10/2019 4:26 PM    Assessment/Plan:  1. SOB  2. Pericardial effusion small on last echo  3. Chronic fatigue  4. Chest pain with normal stress echo 2018- given circumstances, ekg is negative, trop neg but with recurrent cp may need to consider repeat stress testing once off precautions. Will follow course.   5. Mild MR  6. Dyslipidemia  7. HTN on propranolol  8. ESRD on HD, new onset, with recent acute hypoxemic resp failure  9. Anemia Hgb 8.9    Admit Date: 02/10/2019     Admit Diagnosis: SOB (shortness of breath) [R06.02]  Primary Care Physician:Hayes, Suann Larry, MD     Attending Provider: Burnadette Peter, MD  Primary Cardiologist: Dr. Andrey Campanile FOR CONSULT: DD, GERD, Chronic fatigue, IBS    Subjective:     John Friedman is a 53 y.o. male admitted for SOB (shortness of breath) [R06.02].Patient complains of increased shortness of breath and weakness that started 4 days ago. He battles anxiety. No record of cath in the past. Last stress test 2018.   Does not follow regularly with cardiology.   EKG is normal, BNP is up.         Review of Symptoms:  A comprehensive review of systems was negative except for that written in the HPI.    Previous treatment/evaluation includes echocardiogram .  Cardiac risk factors: dyslipidemia, obesity, sedentary life style, male gender, hypertension, stress.    Past Medical History:   Diagnosis Date   ??? Anemia 01/21/2019   ??? ARF (acute renal failure) (HCC)     on HD   ??? BPH (benign prostatic hyperplasia)    ??? Cancer ([place])     left testicle- stage III, nonseminomatous germ cell tumor, s/p orchiectomy & BEP x 3 cycles   ??? Chronic fatigue January 1987   ??? Chronic kidney disease     renal failure, HD   ??? Essential tremor    ??? GERD (gastroesophageal reflux disease)    ??? H/O prolonged Q-T interval on ECG 01/21/2019     Chronic   ??? Hyponatremia 01/21/2019   ??? Hypothyroid    ??? 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 Lumbar radiculopathy 08/10/2012    S/p steroid injection by Dr Laverta [place] 08/01/12    ??? Normal cardiac stress test 09/05/11   ??? Pleural effusion, bilateral 01/21/2019   ??? Pulmonary edema 01/21/2019   ??? Sleep disturbances     acting out in his sleep   ??? Thrombocytopenia ([place]) 01/21/2019     Past Surgical History:   Procedure Laterality Date   ??? HX CHOLECYSTECTOMY  12/05/2011    abnormal HIDA scan (EF < 2%)   ??? HX COLONOSCOPY  07/12/10    hyperplastic polyp, repeat 10 yrs   ??? HX COLONOSCOPY  02/23/15    normal   ??? HX CYST REMOVAL Right 01/06/2016    3rd toe ganglion cyst removal - podiatry (Dr Raliegh Ip)   ??? HX GI  09/09/11    EGD- normal   ??? HX HEENT Left 06/2012    tonsilar bx: benign tissue (Dr Buddy Duty)   ??? HX ORCHIECTOMY Left 2002   ??? 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 HX SHOULDER ARTHROSCOPY  Right 08/13/2018     Current Facility-Administered Medications   Medication Dose Route Frequency   ??? clonazePAM (KlonoPIN) tablet 1 mg  1 mg Oral BID   ??? propranoloL (INDERAL) tablet 20 mg  20 mg Oral DAILY   ??? .PHARMACY TO SUBSTITUTE PER PROTOCOL (Reordered from: armodafinil (NUVIGIL) 200 mg tab)    Per Protocol   ??? clomiPRAMINE (ANAFRANIL) capsule 50 mg  50 mg Oral QHS   ??? [START ON 02/11/2019] liothyronine (CYTOMEL) tablet 5 mcg  5 mcg Oral DAILY   ??? pregabalin (LYRICA) capsule 50 mg  50 mg Oral QHS   ??? sodium chloride (NS) flush 5-40 mL  5-40 mL IntraVENous Q8H   ??? sodium chloride (NS) flush 5-40 mL  5-40 mL IntraVENous PRN   ??? acetaminophen (TYLENOL) tablet 650 mg  650 mg Oral Q4H PRN   ??? ondansetron (ZOFRAN) injection 4 mg  4 mg IntraVENous Q4H PRN   ??? azithromycin (ZITHROMAX) 500 mg in 0.9% sodium chloride (MBP/ADV) 250 mL  500 mg IntraVENous Q24H   ??? tamsulosin (FLOMAX) capsule 0.4 mg  0.4 mg Oral DAILY       Allergies   Allergen Reactions   ??? Latex Other (comments)     redness      Family History   Problem  Relation Age of Onset   ??? No Known Problems Mother    ??? Parkinsonism Father    ??? Arthritis-osteo Father         scoliosis   ??? Dementia Father    ??? No Known Problems Brother    ??? No Known Problems Brother    ??? No Known Problems Sister    ??? Cancer Maternal Grandfather         prostate   ??? Cancer Paternal Grandfather         prostate      Social History     Socioeconomic History   ??? Marital status: SINGLE     Spouse name: Not on file   ??? Number of children: Not on file   ??? Years of education: Not on file   ??? Highest education level: Not on file   Tobacco Use   ??? Smoking status: Never Smoker   ??? Smokeless tobacco: Never Used   Substance and Sexual Activity   ??? Alcohol use: Yes     Frequency: Never     Binge frequency: Never     Comment: rarely   ??? Drug use: No   ??? Sexual activity: Not Currently     Partners: Female          Objective:      Physical Exam  Vitals:    02/10/19 0919 02/10/19 1230 02/10/19 1411 02/10/19 1532   BP: 140/89 133/81 (!) 159/109 (!) 162/104   Pulse: 82 75 78 78   Resp: 18 20 20 24    Temp: 97.7 ??F (36.5 ??C) 97.8 ??F (36.6 ??C) 98.2 ??F (36.8 ??C) 98.2 ??F (36.8 ??C)   SpO2: 99% 95% 100% 94%   Weight: 175 lb (79.4 kg)      Height: 5' 11"  (1.803 m)        Exam deferred for covid precautions.     Telemetry: normal sinus rhythm  ECG: normal EKG, normal sinus rhythm  Echocardiogram: Not done    Data Review:     Recent Labs     02/10/19  0933   TROIQ <0.05     Recent Labs     02/10/19  0933   NA 130*   K 3.1*   CL 92*   CO2 26   BUN 28*   CREA 6.67*   GLU 94   CA 7.9*     Recent Labs     02/10/19  0933   WBC 6.0   HGB 8.9*   HCT 26.6*   PLT 164     Recent Labs     02/10/19  0933   AP 94     No results for input(s): CHOL, LDLC in the last 72 hours.    No lab exists for component: TGL, HDLC,  HBA1C  No results for input(s): CRP, TSH, TSHEXT in the last 72 hours.    No lab exists for component: ESR  Thank you very much for this referral. I appreciate the opportunity to participate in this patient's care.       Telford Nab, MD  NW:GNFAO, Suann Larry, MD

## 2019-02-10 NOTE — Progress Notes (Signed)
 Pharmacist Admission Medication Reconciliation:    Information obtained from: This medication history was obtained from the patient by phone.  RxQuery data available:  YES    Summary:     Medications added: none  Medications deleted: armodafinil   Dose changes: clomipramine 150 mg (vs 50 mg)    Please note that John Friedman is not taking pregabalin or liothyronine yet.  He was previously on pregabalin 300 mg night.  He stopped taking it because he was afraid it was, too much for my kidneys.  He was unaware of the new prescription with reduced dose.  He is waiting to start liothyronine because, I have too much going on right now.      Active and held inpatient orders were reviewed and Dr. Moody has been contacted regarding changes to the PTA medication record.     RxQuery pharmacy benefit data reflects medications processed through the patient's insurance, however this data does NOT capture whether the medication is currently being taken by the patient.    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 Taking?   clomiPRAMINE (ANAFRANIL) 50 mg capsule 02/09/2019  Yes Yes   Sig: Take 150 mg by mouth nightly.   clonazepam (KLONOPIN) 1 mg tablet 02/09/2019  Yes Yes   Sig: Take 1 mg by mouth two (2) times a day.   liothyronine (CYTOMEL) 5 mcg tablet Not Taking  Yes No   Sig: Take 5 mcg by mouth daily.   ondansetron hcl (ZOFRAN) 4 mg tablet   No Yes   Sig: Take 1 Tab by mouth every eight (8) hours as needed for Nausea or Vomiting.   pregabalin (LYRICA) 50 mg capsule Not Taking  No No   Sig: Take 1 Cap by mouth nightly. Max Daily Amount: 50 mg. For sleep disturbances - pt has had injuries from falling out of bed during sleep   propranolol (INDERAL) 20 mg tablet 02/09/2019  Yes Yes   Sig: Take 20 mg by mouth daily. For tremors   tamsulosin (Flomax) 0.4 mg capsule 02/09/2019  Yes Yes   Sig: Take 0.4 mg  by mouth daily.      Facility-Administered Medications: None       Thank you for allowing me to participate in this patient's care.  Please call [phone] or [phone] with any questions.    Olam Ditch, Pharm.D., BCPS, BCPPS

## 2019-02-10 NOTE — ED Notes (Signed)
Attempted to call report. No one available to take report at this time. Will call back in 5 mins

## 2019-02-10 NOTE — ED Provider Notes (Signed)
ED Provider Notes by Marinda Elk, NP at 02/10/19 [phone]                Author: Marinda Elk, NP  Service: Emergency Medicine  Author Type: Nurse Practitioner       Filed: 02/10/19 1703  Date of Service: 02/10/19 0939  Status: Attested           Editor: Lulia Schriner, Wannetta Sender, NP (Nurse Practitioner)  Cosigner: Bari Mantis, MD at 02/12/19 1419          Attestation signed by Bari Mantis, MD at 02/12/19 1419          I was personally available for consultation in the emergency department.  I have reviewed the chart and agree with the documentation recorded by the [facility], including  the assessment, treatment plan, and disposition.   Bari Mantis, MD                                  This is a 53 year old male who presents ambulatory to the emergency room with complaints of generalized weakness.   Patient states he started having chest pain on Thursday continuing into Friday and Saturday now with increased shortness of breath.  Patient states his chest pain has resolved but his shortness of breath is worsening.  Positive orthopnea.  Patient is  a end-stage renal disease on dialysis Monday Wednesday and Friday.  Has not missed any dialysis treatments.  States he is not sure if he is having an anxiety attack or if he truly short of breath.  Denies any current chest pain, dizziness, nausea or vomiting,  fever or chills.  No known COVID exposure.  There are no further complaints at this time.      Antony Haste, MD   Past Medical History:   01/21/2019: Anemia   No date: ARF (acute renal failure) (HCC)       Comment:  on HD   No date: BPH (benign prostatic hyperplasia)   No date: Cancer [facility])       Comment:  left testicle- stage III, nonseminomatous germ cell                 tumor, s/p orchiectomy & BEP x 3 cycles   January 1987: Chronic fatigue   No date: Chronic kidney disease       Comment:  renal failure, HD   No date: Essential tremor   No date: GERD (gastroesophageal reflux disease)   01/21/2019:  H/O prolonged Q-T interval on ECG       Comment:  Chronic   01/21/2019: Hyponatremia   No date: Hypothyroid   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   08/10/2012: Lumbar radiculopathy       Comment:  S/p steroid injection by Dr Laverta Morrow 08/01/12    09/05/11: Normal cardiac stress test   01/21/2019: Pleural effusion, bilateral   01/21/2019: Pulmonary edema   No date: Sleep disturbances       Comment:  acting out in his sleep   01/21/2019: Thrombocytopenia ([place])   Past Surgical History:   12/05/2011: HX CHOLECYSTECTOMY       Comment:  abnormal HIDA scan (EF < 2%)   07/12/10: HX COLONOSCOPY       Comment:  hyperplastic polyp, repeat 10 yrs   02/23/15: HX COLONOSCOPY       Comment:  normal   01/06/2016: HX CYST REMOVAL; Right  Comment:  3rd toe ganglion cyst removal - podiatry (Dr K)   09/09/11: HX GI       Comment:  EGD- normal   06/2012: HX HEENT; Left       Comment:  tonsilar bx: benign tissue (Dr Buddy Duty)   2002: HX ORCHIECTOMY; Left   No date: HX ORTHOPAEDIC       Comment:  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 He/she was evaluated in the context of the global COVID-19 pandemic, which necessitated consideration that the patient  might be at risk for infection with the SARS-CoV-2 virus that causes COVID-19. Institutional protocols and algorithms that pertain to the evaluation of patients at risk for COVID-19 are in a state of rapid change based on information released by regulatory  bodies including the CDC and federal and state organizations. These policies and algorithms were followed during the patient's care in the ED.      Surrogate Decision Maker (Who do you want to make decisions for you in the event you are not able to?): Extended Emergency Contact Information   Primary Emergency Contact: [place] Phone: [phone]   Mobile Phone: [phone]   Relation: Parent   Secondary Emergency Contact: [place], [place] Phone: [phone]   Relation: Sister      Ventilation (Do you want to be intubated and mechanically ventilated?):  Yes      CPR (Do you want chest compressions and electricity in an attempt to restart your heart?): Yes                        Past Medical History:        Diagnosis  Date         ?  Anemia  01/21/2019     ?  ARF (acute renal failure) (HCC)            on HD         ?  BPH (benign prostatic hyperplasia)       ?  Cancer ([place])            left testicle- stage III, nonseminomatous germ cell tumor, s/p orchiectomy & BEP x 3 cycles         ?  Chronic fatigue  January 1987     ?  Chronic kidney disease            renal failure, HD         ?  Essential tremor       ?  GERD (gastroesophageal reflux disease)       ?  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  Hyponatremia  01/21/2019     ?  Hypothyroid       ?  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  Lumbar radiculopathy  08/10/2012          S/p steroid injection by Dr Laverta Gassaway 08/01/12          ?  Normal cardiac stress test  09/05/11     ?  Pleural effusion, bilateral  01/21/2019     ?  Pulmonary edema  01/21/2019     ?  Sleep disturbances            acting out in his sleep         ?  Thrombocytopenia ([place])  01/21/2019             Past Surgical History:         Procedure  Laterality  Date          ?  HX CHOLECYSTECTOMY    12/05/2011          abnormal HIDA scan (EF < 2%)          ?  HX COLONOSCOPY    07/12/10          hyperplastic polyp, repeat 10 yrs          ?  HX COLONOSCOPY    02/23/15          normal          ?  HX CYST REMOVAL  Right  01/06/2016          3rd toe ganglion cyst removal - podiatry (Dr Raliegh Ip)          ?  HX GI    09/09/11          EGD- normal          ?  HX HEENT  Left  06/2012          tonsilar bx: benign tissue (Dr Buddy Duty)          ?  HX ORCHIECTOMY  Left  2002     ?  HX  ORTHOPAEDIC              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35          ?  HX SHOULDER ARTHROSCOPY  Right  08/13/2018               Family History:         Problem  Relation  Age of Onset          ?  No Known Problems  Mother       ?  Parkinsonism  Father       ?  Arthritis-osteo  Father                scoliosis          ?  Dementia  Father       ?  No Known Problems  Brother       ?  No Known Problems  Brother       ?  No Known Problems  Sister       ?  Cancer  Maternal Grandfather                prostate          ?  Cancer  Paternal Grandfather                prostate             Social History          Socioeconomic History         ?  Marital status:  SINGLE              Spouse name:  Not on file         ?  Number of children:  Not on file     ?  Years of education:  Not on file     ?  Highest education level:  Not on file       Occupational History        ?  Not on file       Social Needs         ?  Financial resource strain:  Not on file        ?  Food insecurity              Worry:  Not on file         Inability:  Not on file        ?  Transportation needs              Medical:  Not on file         Non-medical:  Not on file       Tobacco Use         ?  Smoking status:  Never Smoker     ?  Smokeless tobacco:  Never Used       Substance and Sexual Activity         ?  Alcohol use:  Yes              Frequency:  Never         Binge frequency:  Never             Comment: rarely         ?  Drug use:  No     ?  Sexual activity:  Not Currently              Partners:  Female       Lifestyle        ?  Physical activity              Days per week:  Not on file         Minutes per session:  Not on file         ?  Stress:  Not on file       Relationships        ?  Social Health visitor on phone:  Not on file         Gets together:  Not on file         Attends religious service:  Not on file         Active member of club or organization:  Not on file         Attends meetings of clubs or organizations:  Not on file          Relationship status:  Not on file        ?  Intimate partner violence              Fear of current or ex partner:  Not on file         Emotionally abused:  Not on file         Physically abused:  Not on file         Forced sexual activity:  Not on file        Other Topics  Concern        ?  Not on file       Social History Narrative        ?  Not on file  ALLERGIES: Latex      Review of Systems    Constitutional: Negative for activity change, appetite change, chills, fatigue and fever.    HENT: Negative for congestion, ear discharge, ear pain, sinus pressure, sinus pain, sore throat and trouble swallowing.     Eyes: Negative for photophobia, pain, redness, itching and visual disturbance.    Respiratory: Positive for shortness of breath. Negative for chest tightness.     Cardiovascular: Positive for chest pain. Negative for palpitations.    Gastrointestinal: Negative for abdominal distention, abdominal pain, nausea and vomiting.    Endocrine: Negative.     Genitourinary: Negative for difficulty urinating, frequency and urgency.    Musculoskeletal: Negative for back pain, neck pain and neck stiffness.    Skin: Positive for pallor. Negative for color change, rash and wound.    Allergic/Immunologic: Negative.     Neurological: Positive for weakness. Negative for dizziness, syncope and headaches.    Hematological: Does not bruise/bleed easily.    Psychiatric/Behavioral: Negative for behavioral problems. The patient is nervous/anxious.             Vitals:          02/10/19 0919        BP:  140/89     Pulse:  82     Resp:  18     Temp:  97.7 ??F (36.5 ??C)     SpO2:  99%     Weight:  79.4 kg (175 lb)        Height:  _0  (1.803 m)                Physical Exam   Vitals signs and nursing note reviewed.   Constitutional:        General: He is not in acute distress.     Appearance: He is well-developed.    HENT:       Head: Normocephalic and atraumatic.      Right Ear: External ear normal.      Left Ear:  External ear normal.      Nose: Nose normal.      Mouth/Throat:      Mouth: Mucous membranes are moist.   Eyes:       General:         Right eye: No discharge.         Left eye: No discharge.      Conjunctiva/sclera: Conjunctivae normal.      Pupils: Pupils are equal, round, and reactive to light.   Neck:       Musculoskeletal: Normal range of motion and neck supple.      Vascular: No JVD.      Trachea: No tracheal deviation.    Cardiovascular:       Rate and Rhythm: Normal rate and regular rhythm.      Pulses: Normal pulses.      Heart sounds: Normal heart sounds. No murmur. No gallop.    Pulmonary:       Effort: Pulmonary effort is normal. No respiratory distress.      Breath sounds: Normal breath sounds. No wheezing or rales.   Chest:       Chest wall: No tenderness.   Abdominal :      General: Bowel sounds are normal. There is no distension.      Palpations: Abdomen is soft.      Tenderness: There is no abdominal tenderness. There is no guarding or rebound.    Genitourinary :  Comments: Negative    Musculoskeletal: Normal range of motion.          General: No tenderness.    Skin:      General: Skin is warm and dry.      Coloration: Skin is pale.      Findings: No erythema or rash.   Neurological:       General: No focal deficit present.      Mental Status: He is alert and oriented to person, place, and time.      Motor: No weakness.      Coordination: Coordination normal.   Psychiatric:         Mood and Affect: Mood normal.         Behavior: Behavior  normal.         Thought Content: Thought content normal.         Judgment: Judgment normal.              MDM   Number of Diagnoses or Management Options   Elevated brain natriuretic peptide (BNP) level: new and requires workup   Hyponatremia: new and requires workup   Weakness: new and requires workup   Diagnosis management comments: Differential diagnosis includes kidney failure, congestive heart failure, COVID and others      Admit to the hospitalist service  for further evaluation and treatment of hyponatremia, elevated BNP, weakness.   Patient in agreement with plan of care.   Discussed with Dr. Patrica Duel who is in agreement with plan of care.          Amount and/or Complexity of Data Reviewed   Clinical lab tests: ordered and reviewed   Tests in the radiology section of CPT??: ordered and reviewed    Discuss the patient with other providers: yes (Dr. Patrica Duel)           ED Course as of Feb 09 938       Sun Feb 10, 2019        0937  EKG: 9:27 AMNormal sinus rhythm, ventricular rate 77, normal axis, no ST elevation or depression.  QTc 534     [NS]              ED Course User Index   [NS] Bari Mantis, MD          Labs Reviewed       CBC WITH AUTOMATED DIFF - Abnormal; Notable for the following components:            Result  Value            RBC  2.86 (*)         HGB  8.9 (*)         HCT  26.6 (*)         NEUTROPHILS  77 (*)         LYMPHOCYTES  8 (*)         IMMATURE GRANULOCYTES  1 (*)         ABS. LYMPHOCYTES  0.5 (*)         ABS. IMM. GRANS.  0.1 (*)            All other components within normal limits       METABOLIC PANEL, COMPREHENSIVE - Abnormal; Notable for the following components:            Sodium  130 (*)         Potassium  3.1 (*)  Chloride  92 (*)         BUN  28 (*)         Creatinine  6.67 (*)         BUN/Creatinine ratio  4 (*)         GFR est AA  11 (*)         GFR est non-AA  9 (*)         Calcium  7.9 (*)            All other components within normal limits       NT-PRO BNP - Abnormal; Notable for the following components:            NT pro-BNP  9,318 (*)            All other components within normal limits       SARS-COV-2, PCR     SAMPLES BEING HELD     TROPONIN I     SARS-COV-2     SED RATE (ESR)     CRP, HIGH SENSITIVITY     ANA, DIRECT, W/REFLEX       RHEUMATOID FACTOR, QT        Xr Chest Pa Lat      Result Date: 02/10/2019   IMPRESSION: Mild bibasilar patchy airspace disease. Trace bilateral pleural fluid.          Hospitalist Perfect  Serve for Admission   11:45 AM      ED Room Number: ER12/12   Patient Name and age:  John Friedman 53 y.o.   male   Working Diagnosis:       1.  Weakness      2.  Elevated brain natriuretic peptide (BNP) level         3.  Hyponatremia            COVID-19 Suspicion:  yes      Code Status:  Full Code   Readmission: no   Isolation Requirements:  yes   Recommended Level of Care:  telemetry   Department:[facility] ED - [phone]   Other:  COVID test pending, ESRD with dialysis Monday, Wednesday and Friday      Procedures

## 2019-02-10 NOTE — Consults (Signed)
[place]    Name:  John Friedman, John Friedman  MR#:  [id number]  DOB:  [birth year]  ACCOUNT #:  [id number]  DATE OF SERVICE:  02/12/2019    REASON FOR CONSULTATION:  Anxiety.    HISTORY OF PRESENT ILLNESS:  The patient is a 53 year old male who is currently being seen in the medical unit for psychiatric consultation for complaints mentioned above.  His admission to the medical unit is well documented on his chart.  His past medical history is significant for ESRD, on hemodialysis; history of testicular cancer; hypothyroidism.  He presented to the emergency room with 4 days of chest pain.  He states that he has a long history of anxiety and is currently seeing Dr. Delsa Bern on an outpatient basis.  He states that he started seeing him when he moved to [place] 7 years ago.  He tells me that he has been overwhelmed with psychosocial stressors, hemodialysis, family issues, moving into a new apartment.  He is visibly shaking during the interview and appears very anxious.  He spoke to Dr. Delsa Bern yesterday and he will have an appointment with him tomorrow.  He says that Dr. Delsa Bern would like for him to be started on Prozac 20 mg.  He states that he is currently on Klonopin 1 mg, Inderal 20 mg, and Anafranil 150 mg, and seems to be doing okay.  He denies suicidal ideation, homicidal ideation, auditory or visual hallucination.    PAST MEDICAL HISTORY:  See H and P.    PAST PSYCHIATRIC HISTORY:  He has never been admitted in any psychiatric floor.  He has a longstanding diagnosis of generalized anxiety disorder, and sees Dr. Delsa Bern as described above.    MEDICATIONS:  He is currently on Klonopin, Inderal, and Anafranil.    PSYCHOSOCIAL HISTORY:  He is single.  He has no children.  He is originally from [place] and moved to [place] 7 years ago.    MENTAL STATUS EXAMINATION:  He is alert and oriented in all spheres.  He is dressed in street clothes.  He is visibly shaking and anxious during the  interview.  He reports his mood is okay.  Speech, normal rate and rhythm.  Thought process, logical and goal-directed.  He denies suicidal ideation, homicidal ideation, auditory or visual hallucination.  Memory seems intact.  Intelligence seems average.  Insight is partial.  Judgment is fair.    ASSESSMENT AND PLANNING:  The patient carries a diagnosis of generalized anxiety disorder.  He has a followup appointment with Dr. Delsa Bern, his outpatient psychiatrist, tomorrow.  He had a phone consultation with Dr. Delsa Bern yesterday and he tells me that Dr. Delsa Bern recommends for him to be started on Prozac 20 mg.  I also recommend for him to increase his Inderal to 20 mg twice a day to help with shaking, and he agrees.  I have recommended for him to see a counselor or a therapist to help with anxiety.  There is no indication for inpatient psychiatric admission.  Please discharge to home once medically stable.    Thank you for this consult.  Please call with questions.      Breck  A Javari Bufkin, NP      [id number]  D:  02/12/2019 16:11  T:  02/12/2019 17:29  JOB #:  [id number]

## 2019-02-10 NOTE — Progress Notes (Signed)
Patient called and complained of increased chest pressure of 5 out of 10,feeling short of breath,patient is anxious.Rapid response was called ,Dr Elvia Collum notified,EKG,troponin morphine was ordered.Vitlas stable.Patient also complained of nausea,zofran given.1845,RN at bedside,pt states his chest pressure is at 4  Out of 10 now.patient appears comfortable,Pt was taught emotional support ,calm breathing techniques.Will continue to monitor.

## 2019-02-10 NOTE — Progress Notes (Signed)
[id number]  Chest pain  John Peter, MD

## 2019-02-10 NOTE — H&P (Signed)
[place]  HISTORY AND PHYSICAL    Name:  John Friedman, John Friedman  MR#:  [id number]  DOB:  06-Jan-1966  ACCOUNT #:  [id number]  ADMIT DATE:  02/10/2019      ADMITTING ATTENDING:  Dr. Burnadette Peter    PRIMARY CARE PHYSICIAN:  Dr. Renaee Munda    CHIEF COMPLAINT: Chest pain since the last 4 days.    HISTORY OF PRESENT ILLNESS:  This is a 53 year old Caucasian male with a past medical history of end stage renal disease, on hemodialysis on Mondays, Wednesdays, and Fridays; history of testicular cancer in 1987; hypothyroidism, he comes over here because since his last 4 days, he has been having chest pain.  He describes his chest pain as retrosternal pressure like, 3/10 in intensity, nonradiating, off and on, no relation with breathing as well as physical exertion.  He claims that along with that he also has some dry cough with some phlegm.  No fever.  No nasal congestion, but he did have nausea but no vomiting.  No changes in bowel movements.    When specifically asking, the patient does mention that about 3 years ago, he had a stress test done that according to him was negative.  He has been getting dialysis as usual and has not missed any dialysis so far.  He also feels that he is quite anxious.    REVIEW OF SYSTEMS:  Pertinent positive as above.  Rest of the review of systems were done.  They were all negative except for above.    PAST MEDICAL HISTORY:  1.  End-stage renal disease, on hemodialysis.  2.  Chronic fatigue syndrome.  3.  Essential tremor.  4.  GERD.  5.  Hyponatremia.  6.  Hypothyroidism.    PAST SURGICAL HISTORY:  1.  Cholecystectomy.  2.  Colonoscopy.  3.  EGD in 2013.  4.  Orchiectomy in 2002.    FAMILY HISTORY:  Significant for Parkinsonism but no family suggestive of coronary artery disease as per the patient.    SOCIAL HISTORY:  Nonsmoker, nonalcoholic, non drug abuser.    ALLERGIES:  REPORTEDLY ALLERGIC TO LATEX.    HOME MEDICATIONS:  The patient is on following medications.   1.  Nuvigil 200 mg p.o. p.r.n.  2.  Anafranil 50 mg capsule nightly.  3.  Klonopin 1 mg p.o. b.i.d.  4.  Cytomel 5 mcg p.o. daily.  5.  Zofran 4 mg q.8 hours p.r.n.  6.  Lyrica 50 mg p.o. nightly.  7.  Propranolol 20 mg p.o. daily.  8.  Flomax 0.4 mg p.o. daily.    PHYSICAL EXAMINATION:  VITAL SIGNS:  At the time when the patient was seen, the patient's vitals were as follows:  Blood pressure 159/109, pulse 78, afebrile, respiratory rate 20, saturating 100% on room air.  GENERAL:  Not in acute distress, clearly seems quite anxious.  HEENT:  Head:  Normocephalic, atraumatic.  Eyes:  PERRLA.  EOMI.  Ears:  Bilateral hearing normal, no growth.  Nose:  No polyp, no bleeding.  Mouth:  Moist mucous membranes.  Decent oral hygiene.  NECK:  Supple.  No JVD.  No thyromegaly.  RESPIRATORY:  Clear to auscultation bilaterally.  No adventitious breath sounds.  CARDIOVASCULAR:  S1, S2 normal.  No murmurs, rubs, or gallops.  GI:  Bowel sounds present.  Soft, nontender, nondistended.  NEUROLOGICAL:  Cranial nerves II-XII intact.  Motor 4/4 bilaterally in upper limbs and lower limbs.  Sensory normal.  PSYCHIATRIC:  Mood and affect appropriate except for the clear anxiety that can be noted.  No rashes.  No ulcers.    LABORATORY DATA AND RADIOLOGICAL RESULTS:  WBC 6.0, hemoglobin 8.9, hematocrit 26, and platelet count of 164.  Sodium 130, potassium 3.1, chloride 92, bicarb 26, BUN 26, creatinine 6.67.  ProBNP 9318.  Chest x-ray shows mild basilar patchy airspace disease, history of bilateral pleural effusion.  EKG shows normal sinus rhythm.  No ST-T wave changes suggestive of ischemia.    ASSESSMENT AND PLAN:  This is a 53 year old Caucasian male with a past medical history of testicular cancer as well as status post orchiectomy, essential tremor, hypothyroidism, hyponatremia.  He comes over here because of chest pain.  1.  Chest pain from typical and atypical features.  We will admit the  patient under observation.  I will consult Cardiology but would not order any other tests for now as I feel most likely his chest pain is psychiatric.  I will also consult the Psychiatry regarding the same.  2.  End stage renal disease, on hemodialysis on Mondays, Wednesdays, and Fridays.  We will consult Nephrology, currently does not seem in any urgent need for dialysis.  3.  Probable airspace disease.  We will put the patient on azithromycin.  I do not think that there is any need for blood cultures.  4.  Hypothyroidism.  We will do the thyroid function test.  5.  Essential tremor, stable.    I spent about 40 minutes in taking care of the patient.        Burnadette Peter, MD      [id number]  D:  02/10/2019 15:26  T:  02/10/2019 16:48  JOB #:  [id number]

## 2019-02-10 NOTE — ED Provider Notes (Signed)
This is a 53 year old male who presents ambulatory to the emergency room with complaints of generalized weakness.  Patient states he started having chest pain on Thursday continuing into Friday and Saturday now with increased shortness of breath.  Patient states his chest pain has resolved but his shortness of breath is worsening.  Positive orthopnea.  Patient is a end-stage renal disease on dialysis Monday Wednesday and Friday.  Has not missed any dialysis treatments.  States he is not sure if he is having an anxiety attack or if he truly short of breath.  Denies any current chest pain, dizziness, nausea or vomiting, fever or chills.  No known COVID exposure.  There are no further complaints at this time.    Antony Haste, MD  Past Medical History:  01/21/2019: Anemia  No date: ARF (acute renal failure) (HCC)      Comment:  on HD  No date: BPH (benign prostatic hyperplasia)  No date: Cancer [facility])      Comment:  left testicle- stage III, nonseminomatous germ cell                tumor, s/p orchiectomy & BEP x 3 cycles  January 1987: Chronic fatigue  No date: Chronic kidney disease      Comment:  renal failure, HD  No date: Essential tremor  No date: GERD (gastroesophageal reflux disease)  01/21/2019: H/O prolonged Q-T interval on ECG      Comment:  Chronic  01/21/2019: Hyponatremia  No date: Hypothyroid  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  08/10/2012: Lumbar radiculopathy      Comment:  S/p steroid injection by Dr Laverta Upham 08/01/12   09/05/11: Normal cardiac stress test  01/21/2019: Pleural effusion, bilateral  01/21/2019: Pulmonary edema  No date: Sleep disturbances      Comment:  acting out in his sleep  01/21/2019: Thrombocytopenia ([place])  Past Surgical History:  12/05/2011: HX CHOLECYSTECTOMY      Comment:  abnormal HIDA scan (EF < 2%)  07/12/10: HX COLONOSCOPY      Comment:  hyperplastic polyp, repeat 10 yrs  02/23/15: HX COLONOSCOPY      Comment:  normal  01/06/2016: HX CYST REMOVAL; Right       Comment:  3rd toe ganglion cyst removal - podiatry (Dr K)  09/09/11: HX GI      Comment:  EGD- normal  06/2012: HX HEENT; Left      Comment:  tonsilar bx: benign tissue (Dr Buddy Duty)  2002: HX ORCHIECTOMY; Left  No date: HX ORTHOPAEDIC      Comment:  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 He/she was evaluated in the context of the global COVID-19 pandemic, which necessitated consideration that the patient might be at risk for infection with the SARS-CoV-2 virus that causes COVID-19. Institutional protocols and algorithms that pertain to the evaluation of patients at risk for COVID-19 are in a state of rapid change based on information released by regulatory bodies including the CDC and federal and state organizations. These policies and algorithms were followed during the patient's care in the ED.    Surrogate Decision Maker (Who do you want to make decisions for you in the event you are not able to?): Extended Emergency Contact Information  Primary Emergency Contact: [place] Phone: [phone]  Mobile Phone: [phone]  Relation: Parent  Secondary Emergency Contact: Miranda, [place] Phone: [phone]  Relation: Sister    Ventilation (Do you want to be intubated and mechanically ventilated?):  Yes    CPR (Do you want chest compressions and electricity in an attempt to restart your heart?): Yes               Past Medical History:   Diagnosis Date   ??? Anemia 01/21/2019   ??? ARF (acute renal failure) ([place])     on HD   ??? BPH (benign prostatic hyperplasia)    ??? Cancer ([place])     left testicle- stage III, nonseminomatous germ cell tumor, s/p orchiectomy & BEP x 3 cycles   ??? Chronic fatigue January 1987   ??? Chronic kidney disease     renal failure, HD   ??? Essential tremor     ??? GERD (gastroesophageal reflux disease)    ??? 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 Hyponatremia 01/21/2019   ??? Hypothyroid    ??? 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 Lumbar radiculopathy 08/10/2012    S/p steroid injection by Dr Laverta Guadalupe 08/01/12    ??? Normal cardiac stress test 09/05/11   ??? Pleural effusion, bilateral 01/21/2019   ??? Pulmonary edema 01/21/2019   ??? Sleep disturbances     acting out in his sleep   ??? Thrombocytopenia ([place]) 01/21/2019       Past Surgical History:   Procedure Laterality Date   ??? HX CHOLECYSTECTOMY  12/05/2011    abnormal HIDA scan (EF < 2%)   ??? HX COLONOSCOPY  07/12/10    hyperplastic polyp, repeat 10 yrs   ??? HX COLONOSCOPY  02/23/15    normal   ??? HX CYST REMOVAL Right 01/06/2016    3rd toe ganglion cyst removal - podiatry (Dr Raliegh Ip)   ??? HX GI  09/09/11    EGD- normal   ??? HX HEENT Left 06/2012    tonsilar bx: benign tissue (Dr Buddy Duty)   ??? HX ORCHIECTOMY Left 2002   ??? HX ORTHOPAEDIC      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14   ??? HX SHOULDER ARTHROSCOPY Right 08/13/2018         Family History:   Problem Relation Age of Onset   ??? No Known Problems Mother    ??? Parkinsonism Father    ??? Arthritis-osteo Father         scoliosis   ??? Dementia Father    ??? No Known Problems Brother    ??? No Known Problems Brother    ??? No Known Problems Sister    ??? Cancer Maternal Grandfather         prostate   ??? Cancer Paternal Grandfather         prostate       Social History     Socioeconomic History   ??? Marital status: SINGLE     Spouse name: Not on file   ??? Number of children: Not on file   ??? Years of education: Not on file   ??? Highest education level: Not on file   Occupational History   ??? Not on file   Social Needs   ??? Financial resource strain: Not on file   ??? Food insecurity     Worry: Not on file     Inability: Not on file   ??? Transportation needs  Medical: Not on file     Non-medical: Not on file   Tobacco Use   ??? Smoking status: Never Smoker   ??? Smokeless tobacco: Never Used    Substance and Sexual Activity   ??? Alcohol use: Yes     Frequency: Never     Binge frequency: Never     Comment: rarely   ??? Drug use: No   ??? Sexual activity: Not Currently     Partners: Female   Lifestyle   ??? Physical activity     Days per week: Not on file     Minutes per session: Not on file   ??? Stress: Not on file   Relationships   ??? Social Product manager on phone: Not on file     Gets together: Not on file     Attends religious service: Not on file     Active member of club or organization: Not on file     Attends meetings of clubs or organizations: Not on file     Relationship status: Not on file   ??? Intimate partner violence     Fear of current or ex partner: Not on file     Emotionally abused: Not on file     Physically abused: Not on file     Forced sexual activity: Not on file   Other Topics Concern   ??? Not on file   Social History Narrative   ??? Not on file         ALLERGIES: Latex    Review of Systems   Constitutional: Negative for activity change, appetite change, chills, fatigue and fever.   HENT: Negative for congestion, ear discharge, ear pain, sinus pressure, sinus pain, sore throat and trouble swallowing.    Eyes: Negative for photophobia, pain, redness, itching and visual disturbance.   Respiratory: Positive for shortness of breath. Negative for chest tightness.    Cardiovascular: Positive for chest pain. Negative for palpitations.   Gastrointestinal: Negative for abdominal distention, abdominal pain, nausea and vomiting.   Endocrine: Negative.    Genitourinary: Negative for difficulty urinating, frequency and urgency.   Musculoskeletal: Negative for back pain, neck pain and neck stiffness.   Skin: Positive for pallor. Negative for color change, rash and wound.   Allergic/Immunologic: Negative.    Neurological: Positive for weakness. Negative for dizziness, syncope and headaches.   Hematological: Does not bruise/bleed easily.    Psychiatric/Behavioral: Negative for behavioral problems. The patient is nervous/anxious.        Vitals:    02/10/19 0919   BP: 140/89   Pulse: 82   Resp: 18   Temp: 97.7 ??F (36.5 ??C)   SpO2: 99%   Weight: 79.4 kg (175 lb)   Height: '5\' 11"'  (1.803 m)            Physical Exam  Vitals signs and nursing note reviewed.   Constitutional:       General: He is not in acute distress.     Appearance: He is well-developed.   HENT:      Head: Normocephalic and atraumatic.      Right Ear: External ear normal.      Left Ear: External ear normal.      Nose: Nose normal.      Mouth/Throat:      Mouth: Mucous membranes are moist.   Eyes:      General:         Right eye: No discharge.  Left eye: No discharge.      Conjunctiva/sclera: Conjunctivae normal.      Pupils: Pupils are equal, round, and reactive to light.   Neck:      Musculoskeletal: Normal range of motion and neck supple.      Vascular: No JVD.      Trachea: No tracheal deviation.   Cardiovascular:      Rate and Rhythm: Normal rate and regular rhythm.      Pulses: Normal pulses.      Heart sounds: Normal heart sounds. No murmur. No gallop.    Pulmonary:      Effort: Pulmonary effort is normal. No respiratory distress.      Breath sounds: Normal breath sounds. No wheezing or rales.   Chest:      Chest wall: No tenderness.   Abdominal:      General: Bowel sounds are normal. There is no distension.      Palpations: Abdomen is soft.      Tenderness: There is no abdominal tenderness. There is no guarding or rebound.   Genitourinary:     Comments: Negative    Musculoskeletal: Normal range of motion.         General: No tenderness.   Skin:     General: Skin is warm and dry.      Coloration: Skin is pale.      Findings: No erythema or rash.   Neurological:      General: No focal deficit present.      Mental Status: He is alert and oriented to person, place, and time.      Motor: No weakness.      Coordination: Coordination normal.   Psychiatric:          Mood and Affect: Mood normal.         Behavior: Behavior normal.         Thought Content: Thought content normal.         Judgment: Judgment normal.          MDM  Number of Diagnoses or Management Options  Elevated brain natriuretic peptide (BNP) level: new and requires workup  Hyponatremia: new and requires workup  Weakness: new and requires workup  Diagnosis management comments: Differential diagnosis includes kidney failure, congestive heart failure, COVID and others    Admit to the hospitalist service for further evaluation and treatment of hyponatremia, elevated BNP, weakness.  Patient in agreement with plan of care.  Discussed with Dr. Patrica Duel who is in agreement with plan of care.       Amount and/or Complexity of Data Reviewed  Clinical lab tests: ordered and reviewed  Tests in the radiology section of CPT??: ordered and reviewed  Discuss the patient with other providers: yes (Dr. Patrica Duel)      ED Course as of Feb 09 938   Sun Feb 10, 2019   0937 EKG: 9:27 AMNormal sinus rhythm, ventricular rate 77, normal axis, no ST elevation or depression.  QTc 534    [NS]      ED Course User Index  [NS] Bari Mantis, MD     Labs Reviewed   CBC WITH AUTOMATED DIFF - Abnormal; Notable for the following components:       Result Value    RBC 2.86 (*)     HGB 8.9 (*)     HCT 26.6 (*)     NEUTROPHILS 77 (*)     LYMPHOCYTES 8 (*)     IMMATURE GRANULOCYTES  1 (*)     ABS. LYMPHOCYTES 0.5 (*)     ABS. IMM. GRANS. 0.1 (*)     All other components within normal limits   METABOLIC PANEL, COMPREHENSIVE - Abnormal; Notable for the following components:    Sodium 130 (*)     Potassium 3.1 (*)     Chloride 92 (*)     BUN 28 (*)     Creatinine 6.67 (*)     BUN/Creatinine ratio 4 (*)     GFR est AA 11 (*)     GFR est non-AA 9 (*)     Calcium 7.9 (*)     All other components within normal limits   NT-PRO BNP - Abnormal; Notable for the following components:    NT pro-BNP 9,318 (*)     All other components within normal limits    SARS-COV-2, PCR   SAMPLES BEING HELD   TROPONIN I   SARS-COV-2   SED RATE (ESR)   CRP, HIGH SENSITIVITY   ANA, DIRECT, W/REFLEX   RHEUMATOID FACTOR, QT     Xr Chest Pa Lat    Result Date: 02/10/2019  IMPRESSION: Mild bibasilar patchy airspace disease. Trace bilateral pleural fluid.       Hospitalist Perfect Serve for Admission  11:45 AM    ED Room Number: ER12/12  Patient Name and age:  TRAVION KE 53 y.o.  male  Working Diagnosis:   1. Weakness    2. Elevated brain natriuretic peptide (BNP) level    3. Hyponatremia        COVID-19 Suspicion:  yes    Code Status:  Full Code  Readmission: no  Isolation Requirements:  yes  Recommended Level of Care:  telemetry  Department:[facility] Adult ED - [phone]  Other:  COVID test pending, ESRD with dialysis Monday, Wednesday and Friday    Procedures

## 2019-02-10 NOTE — Consults (Signed)
[facility] Cardiology Consult         [address], [place] [address]   [phone] fax [phone]    Name: John Friedman  1966-04-06 [id number]  02/10/2019 4:26 PM    Assessment/Plan:  1. SOB  2. Pericardial effusion small on last echo  3. Chronic fatigue  4. Chest pain with normal stress echo 2018- given circumstances, ekg is negative, trop neg but with recurrent cp may need to consider repeat stress testing once off precautions. Will follow course.   5. Mild MR  6. Dyslipidemia  7. HTN on propranolol  8. ESRD on HD, new onset, with recent acute hypoxemic resp failure  9. Anemia Hgb 8.9    Admit Date: 02/10/2019     Admit Diagnosis: SOB (shortness of breath) [R06.02]  Primary Care Physician:Hayes, Suann Larry, MD     Attending Provider: Burnadette Peter, MD  Primary Cardiologist: Dr. Andrey Campanile FOR CONSULT: DD, GERD, Chronic fatigue, IBS    Subjective:     John Friedman is a 53 y.o. male admitted for SOB (shortness of breath) [R06.02].Patient complains of increased shortness of breath and weakness that started 4 days ago. He battles anxiety. No record of cath in the past. Last stress test 2018.   Does not follow regularly with cardiology.   EKG is normal, BNP is up.         Review of Symptoms:  A comprehensive review of systems was negative except for that written in the HPI.    Previous treatment/evaluation includes echocardiogram .  Cardiac risk factors: dyslipidemia, obesity, sedentary life style, male gender, hypertension, stress.    Past Medical History:   Diagnosis Date   ??? Anemia 01/21/2019   ??? ARF (acute renal failure) (HCC)     on HD   ??? BPH (benign prostatic hyperplasia)    ??? Cancer ([place])     left testicle- stage III, nonseminomatous germ cell tumor, s/p orchiectomy & BEP x 3 cycles   ??? Chronic fatigue January 1987   ??? Chronic kidney disease     renal failure, HD   ??? Essential tremor    ??? GERD (gastroesophageal reflux disease)    ??? H/O prolonged Q-T interval on ECG 01/21/2019     Chronic   ??? Hyponatremia 01/21/2019   ??? Hypothyroid    ??? 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 Lumbar radiculopathy 08/10/2012    S/p steroid injection by Dr Laverta Roscoe 08/01/12    ??? Normal cardiac stress test 09/05/11   ??? Pleural effusion, bilateral 01/21/2019   ??? Pulmonary edema 01/21/2019   ??? Sleep disturbances     acting out in his sleep   ??? Thrombocytopenia ([place]) 01/21/2019     Past Surgical History:   Procedure Laterality Date   ??? HX CHOLECYSTECTOMY  12/05/2011    abnormal HIDA scan (EF < 2%)   ??? HX COLONOSCOPY  07/12/10    hyperplastic polyp, repeat 10 yrs   ??? HX COLONOSCOPY  02/23/15    normal   ??? HX CYST REMOVAL Right 01/06/2016    3rd toe ganglion cyst removal - podiatry (Dr Raliegh Ip)   ??? HX GI  09/09/11    EGD- normal   ??? HX HEENT Left 06/2012    tonsilar bx: benign tissue (Dr Buddy Duty)   ??? HX ORCHIECTOMY Left 2002   ??? 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 HX SHOULDER ARTHROSCOPY  Right 08/13/2018     Current Facility-Administered Medications   Medication Dose Route Frequency   ??? clonazePAM (KlonoPIN) tablet 1 mg  1 mg Oral BID   ??? propranoloL (INDERAL) tablet 20 mg  20 mg Oral DAILY   ??? .PHARMACY TO SUBSTITUTE PER PROTOCOL (Reordered from: armodafinil (NUVIGIL) 200 mg tab)    Per Protocol   ??? clomiPRAMINE (ANAFRANIL) capsule 50 mg  50 mg Oral QHS   ??? [START ON 02/11/2019] liothyronine (CYTOMEL) tablet 5 mcg  5 mcg Oral DAILY   ??? pregabalin (LYRICA) capsule 50 mg  50 mg Oral QHS   ??? sodium chloride (NS) flush 5-40 mL  5-40 mL IntraVENous Q8H   ??? sodium chloride (NS) flush 5-40 mL  5-40 mL IntraVENous PRN   ??? acetaminophen (TYLENOL) tablet 650 mg  650 mg Oral Q4H PRN   ??? ondansetron (ZOFRAN) injection 4 mg  4 mg IntraVENous Q4H PRN   ??? azithromycin (ZITHROMAX) 500 mg in 0.9% sodium chloride (MBP/ADV) 250 mL  500 mg IntraVENous Q24H   ??? tamsulosin (FLOMAX) capsule 0.4 mg  0.4 mg Oral DAILY       Allergies   Allergen Reactions   ??? Latex Other (comments)     redness      Family History    Problem Relation Age of Onset   ??? No Known Problems Mother    ??? Parkinsonism Father    ??? Arthritis-osteo Father         scoliosis   ??? Dementia Father    ??? No Known Problems Brother    ??? No Known Problems Brother    ??? No Known Problems Sister    ??? Cancer Maternal Grandfather         prostate   ??? Cancer Paternal Grandfather         prostate      Social History     Socioeconomic History   ??? Marital status: SINGLE     Spouse name: Not on file   ??? Number of children: Not on file   ??? Years of education: Not on file   ??? Highest education level: Not on file   Tobacco Use   ??? Smoking status: Never Smoker   ??? Smokeless tobacco: Never Used   Substance and Sexual Activity   ??? Alcohol use: Yes     Frequency: Never     Binge frequency: Never     Comment: rarely   ??? Drug use: No   ??? Sexual activity: Not Currently     Partners: Female          Objective:      Physical Exam  Vitals:    02/10/19 0919 02/10/19 1230 02/10/19 1411 02/10/19 1532   BP: 140/89 133/81 (!) 159/109 (!) 162/104   Pulse: 82 75 78 78   Resp: '18 20 20 24   '$ Temp: 97.7 ??F (36.5 ??C) 97.8 ??F (36.6 ??C) 98.2 ??F (36.8 ??C) 98.2 ??F (36.8 ??C)   SpO2: 99% 95% 100% 94%   Weight: 175 lb (79.4 kg)      Height: '5\' 11"'$  (1.803 m)        Exam deferred for covid precautions.     Telemetry: normal sinus rhythm  ECG: normal EKG, normal sinus rhythm  Echocardiogram: Not done    Data Review:     Recent Labs     02/10/19  0933   TROIQ <0.05     Recent Labs     02/10/19  0933   NA 130*   K 3.1*   CL 92*   CO2 26   BUN 28*   CREA 6.67*   GLU 94   CA 7.9*     Recent Labs     02/10/19  0933   WBC 6.0   HGB 8.9*   HCT 26.6*   PLT 164     Recent Labs     02/10/19  0933   AP 94     No results for input(s): CHOL, LDLC in the last 72 hours.    No lab exists for component: TGL, HDLC,  HBA1C  No results for input(s): CRP, TSH, TSHEXT in the last 72 hours.    No lab exists for component: ESR  Thank you very much for this referral. I appreciate the opportunity to  participate in this patient's care.      Telford Nab, MD  JO:INOMV, Suann Larry, MD

## 2019-02-10 NOTE — ED Triage Notes (Signed)
Pt to ED from home with cc of continued SOB and weakness since he was last seen here on Tuesday. Pt recently started dialysis and feels he is not coping well. Last dialysis was Friday.

## 2019-02-10 NOTE — Progress Notes (Signed)
Pharmacist Admission Medication Reconciliation:    Information obtained from: This medication history was obtained from the patient by phone.  RxQuery data available??:  YES    Summary:     Medications added: none  Medications deleted: armodafinil   Dose changes: clomipramine 150 mg (vs 50 mg)    Please note that Maurie Boettcher is not taking pregabalin or liothyronine yet.  He was previously on pregabalin 300 mg night.  He stopped taking it because he was afraid it was, "too much for my kidneys."  He was unaware of the new prescription with reduced dose.  He is waiting to start liothyronine because, "I have too much going on right now."      Active and held inpatient orders were reviewed and Dr. Elvia Collum has been contacted regarding changes to the PTA medication record.     ??RxQuery pharmacy benefit data reflects medications processed through the patient's insurance, however this data does NOT capture whether the medication is currently being taken by the patient.    Allergies:  Latex    Significant PMH/Disease States:   Past Medical History:   Diagnosis Date    Anemia 01/21/2019    ARF (acute renal failure) (HCC)     on HD    BPH (benign prostatic hyperplasia)     Cancer (HCC)     left testicle- stage III, nonseminomatous germ cell tumor, s/p orchiectomy & BEP x 3 cycles    Chronic fatigue January 1987    Chronic kidney disease     renal failure, HD    Essential tremor     GERD (gastroesophageal reflux disease)     H/O prolonged Q-T interval on ECG 01/21/2019    Chronic    Hyponatremia 01/21/2019    Hypothyroid     Irritable bowel syndrome with diarrhea 05/12/2015    Lumbar radiculopathy 08/10/2012    S/p steroid injection by Dr Laverta [place] 08/01/12     Normal cardiac stress test 09/05/11    Pleural effusion, bilateral 01/21/2019    Pulmonary edema 01/21/2019    Sleep disturbances     acting out in his sleep    Thrombocytopenia ([place]) 01/21/2019     Chief Complaint for this Admission:    Chief Complaint   Patient presents with     Fatigue    Shortness of Breath     Prior to Admission Medications:   Prior to Admission Medications   Prescriptions Last Dose Informant Patient Reported? Taking?   clomiPRAMINE (ANAFRANIL) 50 mg capsule 02/09/2019  Yes Yes   Sig: Take 150 mg by mouth nightly.   clonazepam (KLONOPIN) 1 mg tablet 02/09/2019  Yes Yes   Sig: Take 1 mg by mouth two (2) times a day.   liothyronine (CYTOMEL) 5 mcg tablet Not Taking  Yes No   Sig: Take 5 mcg by mouth daily.   ondansetron hcl (ZOFRAN) 4 mg tablet   No Yes   Sig: Take 1 Tab by mouth every eight (8) hours as needed for Nausea or Vomiting.   pregabalin (LYRICA) 50 mg capsule Not Taking  No No   Sig: Take 1 Cap by mouth nightly. Max Daily Amount: 50 mg. For "sleep disturbances" - pt has had injuries from falling out of bed during sleep   propranolol (INDERAL) 20 mg tablet 02/09/2019  Yes Yes   Sig: Take 20 mg by mouth daily. For tremors   tamsulosin (Flomax) 0.4 mg capsule 02/09/2019  Yes Yes   Sig: Take 0.4 mg  by mouth daily.      Facility-Administered Medications: None       Thank you for allowing me to participate in this patient's care.  Please call [phone] or [phone] with any questions.    Freda Munro, Pharm.D., BCPS, BCPPS

## 2019-02-10 NOTE — ED Notes (Signed)
Patient states he is very nervous and "I think I am having a  nervous breakdown"

## 2019-02-10 NOTE — Other (Signed)
TRANSFER - OUT REPORT:    Verbal report given to aeisha RN(name) on John Friedman  being transferred to 402(unit) for routine progression of care       Report consisted of patient???s Situation, Background, Assessment and   Recommendations(SBAR).     Information from the following report(s) SBAR, Kardex, ED Summary, [facility] and Recent Results was reviewed with the receiving nurse.    Lines:   Peripheral IV 02/10/19 Left Forearm (Active)   Site Assessment Clean, dry, & intact 02/10/2019  9:33 AM   Phlebitis Assessment 0 02/10/2019  9:33 AM   Infiltration Assessment 0 02/10/2019  9:33 AM   Dressing Status Clean, dry, & intact 02/10/2019  9:33 AM   Dressing Type Transparent 02/10/2019  9:33 AM        Opportunity for questions and clarification was provided.      Patient transported with:   [employer]

## 2019-02-11 LAB — CBC WITH AUTOMATED DIFF
ABS. BASOPHILS: 0 10*3/uL (ref 0.0–0.1)
ABS. EOSINOPHILS: 0.2 10*3/uL (ref 0.0–0.4)
ABS. IMM. GRANS.: 0 10*3/uL (ref 0.00–0.04)
ABS. LYMPHOCYTES: 0.9 10*3/uL (ref 0.8–3.5)
ABS. MONOCYTES: 0.6 10*3/uL (ref 0.0–1.0)
ABS. NEUTROPHILS: 2.4 10*3/uL (ref 1.8–8.0)
ABSOLUTE NRBC: 0 10*3/uL (ref 0.00–0.01)
BASOPHILS: 1 % (ref 0–1)
EOSINOPHILS: 5 % (ref 0–7)
HCT: 23.2 % — ABNORMAL LOW (ref 36.6–50.3)
HGB: 7.9 g/dL — ABNORMAL LOW (ref 12.1–17.0)
IMMATURE GRANULOCYTES: 0 % (ref 0.0–0.5)
LYMPHOCYTES: 22 % (ref 12–49)
MCH: 31.7 PG (ref 26.0–34.0)
MCHC: 34.1 g/dL (ref 30.0–36.5)
MCV: 93.2 FL (ref 80.0–99.0)
MONOCYTES: 15 % — ABNORMAL HIGH (ref 5–13)
MPV: 11.1 FL (ref 8.9–12.9)
NEUTROPHILS: 57 % (ref 32–75)
NRBC: 0 PER 100 WBC
PLATELET: 125 10*3/uL — ABNORMAL LOW (ref 150–400)
RBC: 2.49 M/uL — ABNORMAL LOW (ref 4.10–5.70)
RDW: 13.4 % (ref 11.5–14.5)
WBC: 4.2 10*3/uL (ref 4.1–11.1)

## 2019-02-11 LAB — EKG, 12 LEAD, INITIAL
Atrial Rate: 76 {beats}/min
Atrial Rate: 78 {beats}/min
Calculated P Axis: 50 degrees
Calculated P Axis: 60 degrees
Calculated R Axis: 51 degrees
Calculated R Axis: 58 degrees
Calculated T Axis: 61 degrees
Calculated T Axis: 70 degrees
Diagnosis: NORMAL
Diagnosis: NORMAL
P-R Interval: 166 ms
P-R Interval: 170 ms
Q-T Interval: 442 ms
Q-T Interval: 470 ms
QRS Duration: 88 ms
QRS Duration: 90 ms
QTC Calculation (Bezet): 503 ms
QTC Calculation (Bezet): 528 ms
Ventricular Rate: 76 {beats}/min
Ventricular Rate: 78 {beats}/min

## 2019-02-11 LAB — METABOLIC PANEL, COMPREHENSIVE
A-G Ratio: 1.3 (ref 1.1–2.2)
ALT (SGPT): 28 U/L (ref 12–78)
AST (SGOT): 14 U/L — ABNORMAL LOW (ref 15–37)
Albumin: 3.2 g/dL — ABNORMAL LOW (ref 3.5–5.0)
Alk. phosphatase: 84 U/L (ref 45–117)
Anion gap: 12 mmol/L (ref 5–15)
BUN/Creatinine ratio: 5 — ABNORMAL LOW (ref 12–20)
BUN: 37 MG/DL — ABNORMAL HIGH (ref 6–20)
Bilirubin, total: 0.5 MG/DL (ref 0.2–1.0)
CO2: 25 mmol/L (ref 21–32)
Calcium: 7.5 MG/DL — ABNORMAL LOW (ref 8.5–10.1)
Chloride: 93 mmol/L — ABNORMAL LOW (ref 97–108)
Creatinine: 7.8 MG/DL — ABNORMAL HIGH (ref 0.70–1.30)
GFR est AA: 9 mL/min/{1.73_m2} — ABNORMAL LOW (ref >60)
GFR est non-AA: 7 mL/min/{1.73_m2} — ABNORMAL LOW (ref >60)
Globulin: 2.5 g/dL (ref 2.0–4.0)
Glucose: 100 mg/dL (ref 65–100)
Potassium: 3 mmol/L — ABNORMAL LOW (ref 3.5–5.1)
Protein, total: 5.7 g/dL — ABNORMAL LOW (ref 6.4–8.2)
Sodium: 130 mmol/L — ABNORMAL LOW (ref 136–145)

## 2019-02-11 LAB — SARS-COV-2, PCR: SARS-CoV-2 by PCR: NOT DETECTED

## 2019-02-11 LAB — PROCALCITONIN
Procalcitonin: 0.14 ng/mL
Procalcitonin: 0.14 ng/mL

## 2019-02-11 LAB — TSH 3RD GENERATION
TSH: 6.46 u[IU]/mL — ABNORMAL HIGH (ref 0.36–3.74)
TSH: 6.46 u[IU]/mL — ABNORMAL HIGH (ref 0.36–3.74)

## 2019-02-11 LAB — CBC WITH AUTO DIFFERENTIAL
Basophils %: 1 % (ref 0–1)
Basophils Absolute: 0 10*3/uL (ref 0.0–0.1)
Eosinophils %: 5 % (ref 0–7)
Eosinophils Absolute: 0.2 10*3/uL (ref 0.0–0.4)
Granulocyte Absolute Count: 0 10*3/uL (ref 0.00–0.04)
Hematocrit: 23.2 % — ABNORMAL LOW (ref 36.6–50.3)
Hemoglobin: 7.9 g/dL — ABNORMAL LOW (ref 12.1–17.0)
Immature Granulocytes: 0 % (ref 0.0–0.5)
Lymphocytes %: 22 % (ref 12–49)
Lymphocytes Absolute: 0.9 10*3/uL (ref 0.8–3.5)
MCH: 31.7 PG (ref 26.0–34.0)
MCHC: 34.1 g/dL (ref 30.0–36.5)
MCV: 93.2 FL (ref 80.0–99.0)
MPV: 11.1 FL (ref 8.9–12.9)
Monocytes %: 15 % — ABNORMAL HIGH (ref 5–13)
Monocytes Absolute: 0.6 10*3/uL (ref 0.0–1.0)
NRBC Absolute: 0 10*3/uL (ref 0.00–0.01)
Neutrophils %: 57 % (ref 32–75)
Neutrophils Absolute: 2.4 10*3/uL (ref 1.8–8.0)
Nucleated RBCs: 0 PER 100 WBC
Platelets: 125 10*3/uL — ABNORMAL LOW (ref 150–400)
RBC: 2.49 M/uL — ABNORMAL LOW (ref 4.10–5.70)
RDW: 13.4 % (ref 11.5–14.5)
WBC: 4.2 10*3/uL (ref 4.1–11.1)

## 2019-02-11 LAB — EKG 12-LEAD
Atrial Rate: 76 {beats}/min
Atrial Rate: 78 {beats}/min
Diagnosis: NORMAL
Diagnosis: NORMAL
P Axis: 50 degrees
P Axis: 60 degrees
P-R Interval: 166 ms
P-R Interval: 170 ms
Q-T Interval: 442 ms
Q-T Interval: 470 ms
QRS Duration: 88 ms
QRS Duration: 90 ms
QTc Calculation (Bazett): 503 ms
QTc Calculation (Bazett): 528 ms
R Axis: 51 degrees
R Axis: 58 degrees
T Axis: 61 degrees
T Axis: 70 degrees
Ventricular Rate: 76 {beats}/min
Ventricular Rate: 78 {beats}/min

## 2019-02-11 LAB — COMPREHENSIVE METABOLIC PANEL
ALT: 28 U/L (ref 12–78)
AST: 14 U/L — ABNORMAL LOW (ref 15–37)
Albumin/Globulin Ratio: 1.3 (ref 1.1–2.2)
Albumin: 3.2 g/dL — ABNORMAL LOW (ref 3.5–5.0)
Alkaline Phosphatase: 84 U/L (ref 45–117)
Anion Gap: 12 mmol/L (ref 5–15)
BUN: 37 MG/DL — ABNORMAL HIGH (ref 6–20)
Bun/Cre Ratio: 5 — ABNORMAL LOW (ref 12–20)
CO2: 25 mmol/L (ref 21–32)
Calcium: 7.5 MG/DL — ABNORMAL LOW (ref 8.5–10.1)
Chloride: 93 mmol/L — ABNORMAL LOW (ref 97–108)
Creatinine: 7.8 MG/DL — ABNORMAL HIGH (ref 0.70–1.30)
EGFR IF NonAfrican American: 7 mL/min/{1.73_m2} — ABNORMAL LOW (ref >60)
GFR African American: 9 mL/min/{1.73_m2} — ABNORMAL LOW (ref >60)
Globulin: 2.5 g/dL (ref 2.0–4.0)
Glucose: 100 mg/dL (ref 65–100)
Potassium: 3 mmol/L — ABNORMAL LOW (ref 3.5–5.1)
Sodium: 130 mmol/L — ABNORMAL LOW (ref 136–145)
Total Bilirubin: 0.5 MG/DL (ref 0.2–1.0)
Total Protein: 5.7 g/dL — ABNORMAL LOW (ref 6.4–8.2)

## 2019-02-11 LAB — SARS-COV-2: SARS-CoV-2: NOT DETECTED

## 2019-02-11 MED ORDER — EPOETIN ALFA-EPBX 10,000 UNIT/ML INJECTION SOLUTION
10000 unit/mL | INTRAMUSCULAR | Status: DC
Start: 2019-02-11 — End: 2019-02-12
  Administered 2019-02-12: 01:00:00 via SUBCUTANEOUS

## 2019-02-11 MED ORDER — CLOMIPRAMINE 25 MG CAP
25 mg | Freq: Every evening | ORAL | Status: DC
Start: 2019-02-11 — End: 2019-02-12
  Administered 2019-02-12: 01:00:00 via ORAL

## 2019-02-11 MED ORDER — CLOMIPRAMINE 25 MG CAP
25 mg | Freq: Once | ORAL | Status: AC
Start: 2019-02-11 — End: 2019-02-10
  Administered 2019-02-11: 03:00:00 via ORAL

## 2019-02-11 MED ORDER — HEPARIN (PORCINE) 1,000 UNIT/ML IJ SOLN
1000 unit/mL | INTRAMUSCULAR | Status: DC | PRN
Start: 2019-02-11 — End: 2019-02-12
  Administered 2019-02-11: 21:00:00 via ARTERIOVENOUS_FISTULA

## 2019-02-11 MED FILL — NORMAL SALINE FLUSH 0.9 % INJECTION SYRINGE: INTRAMUSCULAR | Qty: 20

## 2019-02-11 MED FILL — CYTOMEL 5 MCG TABLET: 5 mcg | ORAL | Qty: 1

## 2019-02-11 MED FILL — NORMAL SALINE FLUSH 0.9 % INJECTION SYRINGE: INTRAMUSCULAR | Qty: 30

## 2019-02-11 MED FILL — PROPRANOLOL 20 MG TAB: 20 mg | ORAL | Qty: 1

## 2019-02-11 MED FILL — CLOMIPRAMINE 25 MG CAP: 25 mg | ORAL | Qty: 6

## 2019-02-11 MED FILL — CLOMIPRAMINE 25 MG CAP: 25 mg | ORAL | Qty: 4

## 2019-02-11 MED FILL — LYRICA 50 MG CAPSULE: 50 mg | ORAL | Qty: 1

## 2019-02-11 MED FILL — CLONAZEPAM 1 MG TAB: 1 mg | ORAL | Qty: 1

## 2019-02-11 MED FILL — RETACRIT 10,000 UNIT/ML INJECTION SOLUTION: 10000 unit/mL | INTRAMUSCULAR | Qty: 2

## 2019-02-11 MED FILL — TAMSULOSIN SR 0.4 MG 24 HR CAP: 0.4 mg | ORAL | Qty: 1

## 2019-02-11 MED FILL — CLOMIPRAMINE 25 MG CAP: 25 mg | ORAL | Qty: 2

## 2019-02-11 MED FILL — HEPARIN (PORCINE) 1,000 UNIT/ML IJ SOLN: 1000 unit/mL | INTRAMUSCULAR | Qty: 3.6

## 2019-02-11 MED FILL — NORMAL SALINE FLUSH 0.9 % INJECTION SYRINGE: INTRAMUSCULAR | Qty: 10

## 2019-02-11 NOTE — Consults (Signed)
Attempted to see patient re psych consult for anxiety. He is currently talking to his outpatient psychiatrist and declined to speak to me. I will try again tomorrow. Bedside nurse aware.

## 2019-02-11 NOTE — Consults (Signed)
Consults  by Willette Cluster, MD at 02/11/19 [place]                Author: Willette Cluster, MD  Service: Nephrology  Author Type: Physician       Filed: 02/11/19 1159  Date of Service: 02/11/19 1154  Status: Signed          Editor: Willette Cluster, MD (Physician)            Consult Orders        1. IP CONSULT TO NEPHROLOGY [[id number]] ordered by Burnadette Peter, MD at 02/10/19 [place] NOTE            Patient: John Friedman  MRN: [id number]   PCP: Antony Haste, MD         DOB:     07-Mar-1966   Age:   53 y.o.   Sex:  male         Referring physician: Cathren Harsh, MD   Reason for consultation: 53 y.o. male with SOB (shortness of breath) [J69.67]  complicated by AKI    Admission Date: 02/10/2019  9:24 AM  LOS: 0 days          ASSESSMENT and  PLAN :       AKI:  - unclear cause, progressive CKD vs obstructive uropathy   - HD MWF at [facility] today   - orders in and [facility] aware   ??   SOB and Chest pain   - SARS-CoV-2 neg   - per cardiology   ??   Anemia of CKD:   - ESA w/ HD while here   ??   Obstructive uropathy:   - foley in place   - f/u w/ urology as an outpt      PNA:   - on azithro      Anxiety:   - on klonopin          Active Problems / Assessment AAActive  :       Active Problems:     SOB (shortness of breath) (02/10/2019)                Subjective:     HPI:  John Friedman is a 53 y.o. Caucasian male who has been admitted to the hospital for  SOB and chest pain.  He has AKI, likely obstructive uropathy and has been on HD MWF at [facility].  He presented last night with SOB.  He is COVID neg this admission and in the past.  His CXR shows b/l airspace disease.  He did have a thoracentesis  last admission.  He is feeling ok.  Stable O2 on RA now.  No cp or sob reported.  No n/v/d, fevers or chills      Past Medical Hx:      Past Medical History:        Diagnosis  Date         ?  Anemia  01/21/2019     ?  ARF (acute renal failure)  (HCC)            on HD         ?  BPH (benign prostatic  hyperplasia)       ?  Cancer ([place])            left testicle- stage III, nonseminomatous germ cell tumor, s/p orchiectomy & BEP x 3 cycles         ?  Chronic fatigue  January 1987     ?  Chronic kidney disease            renal failure, HD         ?  Essential tremor       ?  GERD (gastroesophageal reflux disease)       ?  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  Hyponatremia  01/21/2019     ?  Hypothyroid       ?  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  Lumbar radiculopathy  08/10/2012          S/p steroid injection by Dr Laverta [place] 08/01/12          ?  Normal cardiac stress test  09/05/11     ?  Pleural effusion, bilateral  01/21/2019     ?  Pulmonary edema  01/21/2019     ?  Sleep disturbances            acting out in his sleep         ?  Thrombocytopenia ([place])  01/21/2019            Past Surgical Hx:         Past Surgical History:         Procedure  Laterality  Date          ?  HX CHOLECYSTECTOMY    12/05/2011          abnormal HIDA scan (EF < 2%)          ?  HX COLONOSCOPY    07/12/10          hyperplastic polyp, repeat 10 yrs          ?  HX COLONOSCOPY    02/23/15          normal          ?  HX CYST REMOVAL  Right  01/06/2016          3rd toe ganglion cyst removal - podiatry (Dr Raliegh Ip)          ?  HX GI    09/09/11          EGD- normal          ?  HX HEENT  Left  06/2012          tonsilar bx: benign tissue (Dr Buddy Duty)          ?  HX ORCHIECTOMY  Left  2002     ?  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  HX SHOULDER ARTHROSCOPY  Right  08/13/2018           Medications:     Prior to Admission medications             Medication  Sig  Start Date  End Date  Taking?  Authorizing Provider  ondansetron hcl (ZOFRAN) 4 mg tablet  Take 1 Tab by mouth every eight (8) hours as needed for Nausea or Vomiting.  02/06/19    Yes  Antony Haste, MD     clomiPRAMINE (ANAFRANIL) 50 mg capsule  Take 150 mg  by mouth nightly.      Yes  Provider, Historical     tamsulosin (Flomax) 0.4 mg capsule  Take 0.4 mg by mouth daily.      Yes  Provider, Historical     propranolol (INDERAL) 20 mg tablet  Take 20 mg by mouth daily. For tremors      Yes  Provider, Historical     clonazepam (KLONOPIN) 1 mg tablet  Take 1 mg by mouth two (2) times a day.      Yes  Provider, Historical     pregabalin (LYRICA) 50 mg capsule  Take 1 Cap by mouth nightly. Max Daily Amount: 50 mg. For "sleep disturbances" - pt has had injuries from falling out of bed during sleep  01/29/19      Antony Haste, MD            liothyronine (CYTOMEL) 5 mcg tablet  Take 5 mcg by mouth daily.        Provider, Historical             Allergies        Allergen  Reactions         ?  Latex  Other (comments)             redness           Social Hx:  reports that he has never smoked. He has never used smokeless tobacco. He reports current alcohol use. He reports that he does not use drugs.         Family History         Problem  Relation  Age of Onset          ?  No Known Problems  Mother       ?  Parkinsonism  Father       ?  Arthritis-osteo  Father                scoliosis          ?  Dementia  Father       ?  No Known Problems  Brother       ?  No Known Problems  Brother       ?  No Known Problems  Sister       ?  Cancer  Maternal Grandfather                prostate          ?  Cancer  Paternal Grandfather                prostate           Review of Systems:   A twelve point review of system was performed today. Pertinent positives and negatives are  mentioned in the HPI. The reminder of the ROS is negative and noncontributory.         Objective:      Vitals:       Vitals:             02/11/19 0313  02/11/19 0722  02/11/19 1006  02/11/19 1105           BP:  125/84  Marland Kitchen)  150/92  144/85  123/86     Pulse:  80  85  88  78     Resp:  16  16  20  16      Temp:  98.7 ??F (37.1 ??C)  98.3 ??F (36.8 ??C)    98.2 ??F (36.8 ??C)     SpO2:  95%  97%  95%  95%     Weight:  83.5 kg  (184 lb)                 Height:                I&O's:  06/28 0701 - 06/29 0700   In: 250 [I.V.:250]   Out: 1200 [Urine:1200]   Visit Vitals      BP  123/86 (BP 1 Location: Right arm, BP Patient Position: At rest)     Pulse  78     Temp  98.2 ??F (36.8 ??C)     Resp  16     Ht  5\' 11"  (1.803 m)     Wt  83.5 kg (184 lb)     SpO2  95%        BMI  25.66 kg/m??           Physical Exam:   General:Alert, No distress,    HEENT: Eyes are PERRL.  Conjunctiva without pallor ,erythema.  The sclerae without icterus. .    Neck:Supple,no mass palpable   Lungs : Clears to auscultation Bilaterally, Normal respiratory effort   CVS: RRR, S1 S2 normal, No rub, no LE edema   Abdomen: Soft, Non tender, No hepatosplenomegaly, bowel sounds present   Extremities: No cyanosis, No clubbing   Skin: No rash or lesions.   Lymph nodes: No palpable nodes   MS: No joint swelling, erythema, warmth   Neurologic: non focal, AAO x 3   Psych: normal affect   GU: foley in place      Laboratory Results:        Lab Results         Component  Value  Date            BUN  37 (H)  02/11/2019       NA  130 (L)  02/11/2019       K  3.0 (L)  02/11/2019       CL  93 (L)  02/11/2019            CO2  25  02/11/2019             Lab Results         Component  Value  Date            BUN  37 (H)  02/11/2019       BUN  28 (H)  02/10/2019       BUN  21 (H)  02/05/2019       BUN  13  01/27/2019       BUN  15  01/26/2019       K  3.0 (L)  02/11/2019       K  3.1 (L)  02/10/2019            K  3.0 (L)  02/05/2019            K  3.6  01/27/2019            K  3.5  01/26/2019  Lab Results         Component  Value  Date            WBC  4.2  02/11/2019       RBC  2.49 (L)  02/11/2019       HGB  7.9 (L)  02/11/2019       HCT  23.2 (L)  02/11/2019       MCV  93.2  02/11/2019       MCH  31.7  02/11/2019       RDW  13.4  02/11/2019            PLT  125 (L)  02/11/2019             Lab Results         Component  Value  Date            PHOS  4.1  02/05/2019           Urine  dipstick:      Lab Results         Component  Value  Date/Time            Color  YELLOW/STRAW  02/05/2019 07:52 PM       Appearance  CLEAR  02/05/2019 07:52 PM       Specific gravity  1.006  02/05/2019 07:52 PM       pH (UA)  >8.5 (H)  02/05/2019 07:52 PM       Protein  100 (A)  02/05/2019 07:52 PM       Glucose  Negative  02/05/2019 07:52 PM       Ketone  Negative  02/05/2019 07:52 PM       Bilirubin  Negative  02/05/2019 07:52 PM       Urobilinogen  0.2  02/05/2019 07:52 PM       Nitrites  Negative  02/05/2019 07:52 PM       Leukocyte Esterase  MODERATE (A)  02/05/2019 07:52 PM       Epithelial cells  FEW  02/05/2019 07:52 PM       Bacteria  Negative  02/05/2019 07:52 PM       WBC  10-20  02/05/2019 07:52 PM            RBC  5-10  02/05/2019 07:52 PM           I have reviewed the following:     All pertinent labs, microbiology data, radiology imaging for my assessment                Thank you for allowing [place] to participate in the care of this patient.    We will follow patient. Please dont hesitate to call with any questions      Willette Cluster, MD   02/11/2019            [facility]     [address] [place] [address]   Phone - [phone]    Fax - [phone]   [email or web address]

## 2019-02-11 NOTE — Progress Notes (Signed)
TRANSITIONS OF CARE PLAN:   1. DESTINATION: Likely Own Home  2. TRANSPORT: Self - vehicle is on site  3. ADDITIONAL SUPPORT: Family support appears limited  4. DME: None  5. HOME HEALTH: TBD  6. CODE STATUS/AMD STATUS: Full Code; not on file, ACP completed  7. FOLLOW UP APPOINTMENTS: PCP, Nephro  8. STILL NEEDS: PUI COVID, Psych Consult pending, Nephro Consult Pending, Likely HD today, Possible Stress Test    Reason for Admission:   SOB                   RUR Score:          N/A; RRAT: 4 - Patient has had: 1 ED visit 6/23; OBS stay 6/12-6/14; Hypoxemia; INP stay 6/8-6/9, PNA           Plan for utilizing home health:      TBD    PCP: First and Last name:  Dr. Renaee Munda   Name of Practice: [facility]   Are you a current patient: Yes/No: YES   Approximate date of last visit: 1 week   Can you participate in a virtual visit with your PCP: NO                    Current Advanced Directive/Advance Care Plan: Full Code; not on file - ACP completed                         Transition of Care Plan:                CM spoke with patient by phone, with patient alert and oriented x 4.  Patient identified that he is not taking very good care of himself and that a lot has been going on for him.  It appears that patient has minimal home support.  Patient lives alone in a 5th floor apartment with elevator access.  CM updated emergency contact information per patient's request.  Patient has no hx of HH, IPR, or SNF.  Pharmacy preference [facility] [address].  With chart review, it appears that patient recently started HD with MWF schedule; facility is not known.  CM discussed OBS status, but patient became flustered and excused self from phone.              Care Management Interventions  PCP Verified by CM: Yes(last seen by Dr. Amedeo Plenty 1 week ago)  Palliative Care Criteria Met (RRAT>21 & CHF Dx)?: No  Mode of Transport at Discharge: Self(vehicle is on site)  Transition of Care Consult (CM Consult): Discharge Planning  MyChart  Signup: Yes  Discharge Durable Medical Equipment: No(has no DME)  Health Maintenance Reviewed: Yes(CM spoke with patient, with patient alert and oriented x 4)  Physical Therapy Consult: No  Occupational Therapy Consult: No  Speech Therapy Consult: No  Current Support Network: Own Home, Lives Alone  Confirm Follow Up Transport: Self(independent in ADLs, to include driving and living alone)  [employer] Provided?: No  Discharge Location  Discharge Placement: Unable to determine at this time(lives alone in a 5th floor apartment with elevator access)  CRM: Irving Shows, MPH, [place]; Z: [phone]

## 2019-02-11 NOTE — Progress Notes (Signed)
Progress Notes by Cammy Copa, MD at 02/11/19 1417                Author: Cammy Copa, MD  Service: Cardiology  Author Type: Physician       Filed: 02/11/19 2116  Date of Service: 02/11/19 1417  Status: Addendum          Editor: Cammy Copa, MD (Physician)          Related Notes: Original Note by Eula Listen (Physician Assistant) filed at 02/11/19  1431                 Assessment/Plan/Discussion:Cardiology Attending :          Patient seen on the day of progress note and examined  and agree with Advance Practice Provider (APP, NP,PA)  assessment and plans.    John Friedman is a 53 y.o.  male    Seen in afternoon while getting hemodialysis    Talkative pleasant gentleman recently started on HD in past month   Admits to lots of water intake for many years and still takes in several liters of H2O daily   Atypical CP with normal trop, normal EF, prev normal stress test   Likely relates to water intake,exam CV ok   Will see back PRN   Can fu in office 2 months and monitor for any ischemic sx   Cammy Copa, MD                                                                                         Cardiology Progress Note                 Admit Date: 02/10/2019   Admit Diagnosis: SOB (shortness of breath) [R06.02]   Date: 02/11/2019     Time:  2:17 PM      COVID NEGATIVE.      Subjective:   Feeling better today.  No longer on isolation.  COVID negative.  No longer with chest pressure or SOB with laying flat.         Assessment and Plan        1. Chest pressure    - atypical with some typical features    - resolved    - Troponin 0.05 x 2    - EKG with NSR.  No evidence for ACS    - CXR with edema and airspace disease   2. ESRD    - Nephrology following    - getting HD now    - ? Dry weight with continued effusions and SOB - need to pull more on dialysis?    - ESRD etiology unclear   3. Hypothyroidism    - TSH elevated    - He has not started his Cytomel as prescribed as outpatient    -  Started as inpatient   4. CFS    - contributing to presentation   5. Anxiety    - Seems high strung    - Psyc consulted by Primary team   6. Elevated proBNP    - 2/2 ESRD     01/25/19  ECHO ADULT FOLLOW-UP OR LIMITED 01/26/2019 01/26/2019           Narrative  ?? Normal cavity size, wall thickness and systolic function (ejection    fraction normal). Estimated left ventricular ejection fraction is 55 -    60%. Visually measured ejection fraction.   ?? Trivial pericardial effusion. Effusion is fluid.                 Signed by: Shelle Iron, MD      - no evidence for HF   7. Anemia    - as related to ESRD      No longer with chest pressure or orthopnea.  CXR with edema/effusion and airspace disease.  Normal troponin and EKG.  Suspect SOB/pressure not cardiac related.  Stress echo 2018 normal.  Getting HD at this time, suspect needs more fluid pulled with dialysis.   No acute indication at this time for ischemic work up.  TSH is elevated.  D/w him about starting his prescribed meds as directed.           No results found for: CRES      Past Medical History:        Diagnosis  Date         ?  Anemia  01/21/2019     ?  ARF (acute renal failure) (HCC)            on HD         ?  BPH (benign prostatic hyperplasia)       ?  Cancer ([place])            left testicle- stage III, nonseminomatous germ cell tumor, s/p orchiectomy & BEP x 3 cycles         ?  Chronic fatigue  January 1987     ?  Chronic kidney disease            renal failure, HD         ?  Essential tremor       ?  GERD (gastroesophageal reflux disease)       ?  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  Hyponatremia  01/21/2019     ?  Hypothyroid       ?  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  Lumbar radiculopathy  08/10/2012          S/p steroid injection by Dr Laverta [place] 08/01/12          ?  Normal cardiac stress test  09/05/11     ?  Pleural effusion, bilateral  01/21/2019     ?  Pulmonary edema  01/21/2019     ?  Sleep disturbances             acting out in his sleep         ?  Thrombocytopenia ([place])  01/21/2019           Social History          Tobacco Use         ?  Smoking status:  Never Smoker     ?  Smokeless tobacco:  Never Used       Substance Use Topics         ?  Alcohol use:  Yes  Frequency:  Never         Binge frequency:  Never             Comment: rarely         ?  Drug use:  No                Review of Systems:     _0  Patient unable to provide secondary to  condition      _1  All systems negative, except as checked  below.     Constitutional:     _2 Weight Change  _3 Fever   _4  Chills   _5 Night Sweats  _6 Fatigue  _7  Malaise  _8 ____   ENT/Mouth:      _9 Hearing Changes  _10 Ear Pain  _11  Nasal Congestion   _12 Sinus Pain  _13 Hoarseness    _14 Sore throat  _15 Rhinorrhea  _16  Swallowing Difficulty  _17 ____   Eyes:     _18 Eye Pain  _19 Swelling  _20  Redness  _21 Foreign Body  _22 Discharge  _23  Vision Changes  _24 ____   Cardiovascular:     _25 Chest Pain  _26 SOB  _27  PND  _28 DOE  _29 Orthopnea  _30  Claudication  _31 Edema    _32 Palpitations  _33 ____   Respiratory:     _34 Cough  _35 Sputum  _36  Wheezing,  _37 SOB  _38 Hemoptysis  _39  ____   Gastrointestinal:     _40 Nausea  _41 Vomiting  _42  Diarrhea  _43 Constipation  _44 Pain  _45  Heartburn  _46 Anorexia   _47 Dysphagia  _48 Hematochezia  _49  Melena,  _50 Jaundice  _51 ____   Genitourinary:     _52 Dysuria  _53 Urinary Frequency  _54  Hematuria  _55 Urinary Incontinence  _56 Urgency   _57 Flank Pain  _58 Hesitancy  _59  ____   Musculoskeletal:     _60 Arthralgias  _61 Myalgias  _62  Joint Swelling  _63 Joint Stiffness  _64 Back Pain  _65  Neck Pain  _66 ____   Skin:     _67 Skin Lesions  _68 Pruritis  _69  Hair Changes  _70 Skin rashes  _71 ____   Neuro:     _72 Weakness  _73 Numbness  _74  Paresthesias  _75 Loss of Consciousness  _76 Syncope    _77 Dizziness  _78 Headache  _79  Coordination Changes  _80 Recent Falls  _81 ____   Psych:     _82 Anxiety/Depression  _83 Insomnia  _84  Memory Changes  _85 Violence/Abuse Hx.  _86 ____   Heme/Lymph:     _87 Bruising  _88 Bleeding  _89  Lymphadenopathy  _90 ____    Endocrine:     _91 Polyuria  _92 Polydipsia  _93  Temperature Intolerance  _94 ____              Objective:       Physical Exam:                  Visit Vitals      BP  123/86 (BP 1 Location: Right arm, BP Patient Position: At rest)     Pulse  78     Temp  98.2 ??F (36.8 ??C)     Resp  16     Ht  _95  (1.803 m)     Wt  184 lb (83.5 kg)     SpO2  95%        BMI  25.66 kg/m??               General Appearance:    Well developed, well nourished,alert and oriented x 3, and    individual in no acute distress.        Ears/Nose/Mouth/Throat:     Hearing grossly normal.  Neck:   Supple.     Chest:     Lungs clear anterolaterally to auscultation bilaterally.        Cardiovascular:    Regular rate and rhythm, S1, S2 normal, no murmur.        Abdomen:     Soft, non-tender, bowel sounds are active.        Extremities:   No edema bilaterally.         Skin:   Warm and dry.        Telemetry: normal sinus rhythm       Data Review:     Labs:       Recent Results (from the past 24 hour(s))     EKG, 12 LEAD, INITIAL          Collection Time: 02/10/19  6:23 PM         Result  Value  Ref Range            Ventricular Rate  76  BPM       Atrial Rate  76  BPM       P-R Interval  170  ms       QRS Duration  90  ms       Q-T Interval  470  ms       QTC Calculation (Bezet)  528  ms       Calculated P Axis  50  degrees       Calculated R Axis  51  degrees       Calculated T Axis  61  degrees       Diagnosis                 Normal sinus rhythm   Prolonged QT   When compared with ECG of 10-Feb-2019 09:27,   No significant change was found          SED RATE (ESR)          Collection Time: 02/10/19  6:32 PM         Result  Value  Ref Range            Sed rate, automated  37 (H)  0 - 20 mm/hr       CRP, HIGH SENSITIVITY          Collection Time: 02/10/19  6:32 PM         Result  Value  Ref Range            CRP, High sensitivity  7.4  mg/L       RHEUMATOID FACTOR, QT          Collection Time: 02/10/19  6:32 PM         Result  Value  Ref Range             Rheumatoid factor  <10  <15 IU/mL       TROPONIN I          Collection Time: 02/10/19  6:32 PM         Result  Value  Ref Range            Troponin-I, Qt.  <0.05  <0.05 ng/mL       METABOLIC PANEL, COMPREHENSIVE          Collection Time: 02/11/19  2:41 AM         Result  Value  Ref Range  Sodium  130 (L)  136 - 145 mmol/L       Potassium  3.0 (L)  3.5 - 5.1 mmol/L       Chloride  93 (L)  97 - 108 mmol/L       CO2  25  21 - 32 mmol/L       Anion gap  12  5 - 15 mmol/L       Glucose  100  65 - 100 mg/dL       BUN  37 (H)  6 - 20 MG/DL       Creatinine  7.80 (H)  0.70 - 1.30 MG/DL       BUN/Creatinine ratio  5 (L)  12 - 20         GFR est AA  9 (L)  >60 ml/min/1.15m       GFR est non-AA  7 (L)  >60 ml/min/1.764m      Calcium  7.5 (L)  8.5 - 10.1 MG/DL       Bilirubin, total  0.5  0.2 - 1.0 MG/DL       ALT (SGPT)  28  12 - 78 U/L       AST (SGOT)  14 (L)  15 - 37 U/L       Alk. phosphatase  84  45 - 117 U/L       Protein, total  5.7 (L)  6.4 - 8.2 g/dL       Albumin  3.2 (L)  3.5 - 5.0 g/dL       Globulin  2.5  2.0 - 4.0 g/dL       A-G Ratio  1.3  1.1 - 2.2         CBC WITH AUTOMATED DIFF          Collection Time: 02/11/19  2:41 AM         Result  Value  Ref Range            WBC  4.2  4.1 - 11.1 K/uL       RBC  2.49 (L)  4.10 - 5.70 M/uL       HGB  7.9 (L)  12.1 - 17.0 g/dL       HCT  23.2 (L)  36.6 - 50.3 %       MCV  93.2  80.0 - 99.0 FL       MCH  31.7  26.0 - 34.0 PG       MCHC  34.1  30.0 - 36.5 g/dL       RDW  13.4  11.5 - 14.5 %       PLATELET  125 (L)  150 - 400 K/uL       MPV  11.1  8.9 - 12.9 FL       NRBC  0.0  0 PER 100 WBC       ABSOLUTE NRBC  0.00  0.00 - 0.01 K/uL       NEUTROPHILS  57  32 - 75 %       LYMPHOCYTES  22  12 - 49 %       MONOCYTES  15 (H)  5 - 13 %       EOSINOPHILS  5  0 - 7 %       BASOPHILS  1  0 - 1 %       IMMATURE GRANULOCYTES  0  0.0 - 0.5 %  ABS. NEUTROPHILS  2.4  1.8 - 8.0 K/UL       ABS. LYMPHOCYTES  0.9  0.8 - 3.5 K/UL       ABS. MONOCYTES  0.6  0.0 - 1.0  K/UL       ABS. EOSINOPHILS  0.2  0.0 - 0.4 K/UL       ABS. BASOPHILS  0.0  0.0 - 0.1 K/UL       ABS. IMM. GRANS.  0.0  0.00 - 0.04 K/UL       DF  AUTOMATED          EKG, 12 LEAD, INITIAL          Collection Time: 02/11/19 10:58 AM         Result  Value  Ref Range            Ventricular Rate  78  BPM       Atrial Rate  78  BPM       P-R Interval  166  ms       QRS Duration  88  ms       Q-T Interval  442  ms       QTC Calculation (Bezet)  503  ms       Calculated P Axis  60  degrees       Calculated R Axis  58  degrees       Calculated T Axis  70  degrees       Diagnosis                 Normal sinus rhythm   Prolonged QT   When compared with ECG of 10-Feb-2019 18:23,   No significant change was found          PROCALCITONIN          Collection Time: 02/11/19 12:26 PM         Result  Value  Ref Range            Procalcitonin  0.14  ng/mL       TSH 3RD GENERATION          Collection Time: 02/11/19 12:26 PM         Result  Value  Ref Range            TSH  6.46 (H)  0.36 - 3.74 uIU/mL              Radiology:             Current Facility-Administered Medications          Medication  Dose  Route  Frequency           ?  epoetin alfa-epbx (RETACRIT) injection 20,000 Units   20,000 Units  SubCUTAneous  DIALYSIS MON, WED & FRI     ?  clonazePAM (KlonoPIN) tablet 1 mg   1 mg  Oral  BID     ?  propranoloL (INDERAL) tablet 20 mg   20 mg  Oral  DAILY     ?  liothyronine (CYTOMEL) tablet 5 mcg   5 mcg  Oral  DAILY     ?  pregabalin (LYRICA) capsule 50 mg   50 mg  Oral  QHS     ?  sodium chloride (NS) flush 5-40 mL   5-40 mL  IntraVENous  Q8H     ?  sodium chloride (NS) flush 5-40 mL   5-40 mL  IntraVENous  PRN     ?  acetaminophen (TYLENOL) tablet 650 mg   650 mg  Oral  Q4H PRN     ?  ondansetron (ZOFRAN) injection 4 mg   4 mg  IntraVENous  Q4H PRN           ?  azithromycin (ZITHROMAX) 500 mg in 0.9% sodium chloride (MBP/ADV) 250 mL   500 mg  IntraVENous  Q24H           ?  tamsulosin (FLOMAX) capsule 0.4 mg   0.4 mg  Oral  DAILY            ?  clomiPRAMINE (ANAFRANIL) capsule 150 mg   150 mg  Oral  QHS           Tyler R. Nadara Mustard, PA-C   Cammy Copa, MD         [facility], [address]    [place], [place]    [phone]

## 2019-02-11 NOTE — Progress Notes (Signed)
1910 TRANSFER - IN REPORT:    Verbal report received from [facility], Charity fundraiser (name) on John Friedman  being received from [facility] (unit) for routine progression of care      Report consisted of patient's Situation, Background, Assessment and   Recommendations(SBAR).     Information from the following report(s) SBAR and Kardex was reviewed with the receiving nurse.    Opportunity for questions and clarification was provided.      Assessment completed upon patient's arrival to unit and care assumed.     1930 Bedside shift change report given to [place], Charity fundraiser  (Cabin crew) by Tresa Endo, RN (offgoing nurse). Report included the following information SBAR, Kardex, Intake/Output, MAR and Recent Results.

## 2019-02-11 NOTE — ACP (Advance Care Planning) (Signed)
Advance Care Planning     Advance Care Planning Activator (Inpatient)  Conversation Note      Date of ACP Conversation: 02/11/19     Conversation Conducted with:   Patient with Decision Making Capacity    ACP Activator: Lucianne Lei    *When Decision Maker makes decisions on behalf of the incapacitated patient: Decision Maker is asked to consider and make decisions based on patient values, known preferences, or best interests.     Health Care Decision Maker:    Current Designated Health Care Decision Maker:   Primary Decision Maker: Fransico Meadow - Parent [phone]    Secondary Decision Maker: Antony Contras (biological mother) - Other Non-relative - [phone]    Supplemental (Other) Decision Maker: [place]es, Saucer - Sister [phone]    Patient would want Secondary and Supplemental decision makers to jointly make decisions    Care Preferences    Ventilation:  "If you were in your present state of health and suddenly became very ill and were unable to breathe on your own, what would your preference be about the use of a ventilator (breathing machine) if it were available to you?"  YES    "If your health worsens and it becomes clear that your chance of recovery is unlikely, what would your preference be about the use of a ventilator (breathing machine) if it were available to you?" YES    Resuscitation  "CPR works best to restart the heart when there is a sudden event, like a heart attack, in someone who is otherwise healthy. Unfortunately, CPR does not typically restart the heart for people who have serious health conditions or who are very sick." YES      []  Yes  [x]  No   Educated Patient / Decision Maker regarding differences between Advance Directives and portable DNR orders.    Length of ACP Conversation in minutes:  10    Conversation Outcomes:  [x]  ACP discussion completed  []  Existing advance directive reviewed with patient; no changes to patient's previously recorded  wishes     []  New Advance Directive completed   []  Portable Do Not Resuscitate prepared for Provider review and signature  []  POLST/POST/MOLST/MOST prepared for Provider review and signature      Follow-up plan:    []  Schedule follow-up conversation to continue planning  []  Referred individual to Provider for additional questions/concerns   []  Advised patient/agent/surrogate to review completed ACP document and update if needed with changes in condition, patient preferences or care setting     []  This note routed to one or more involved healthcare providers      CRM: Zollie Beckers, MPH, [facility]; Z: [phone]

## 2019-02-11 NOTE — Progress Notes (Signed)
 Verbal shift change report given to [employer] (oncoming nurse) by Ulysees RN (offgoing nurse). Report included the following information SBAR, Kardex, ED Summary, Intake/Output, MAR, Accordion, Med Rec Status and Cardiac Rhythm NSR.     Patient Vitals for the past 12 hrs:   Temp Pulse Resp BP SpO2   02/11/19 0722 98.3 F (36.8 C) 85 16 (!) 150/92 97 %   02/11/19 0313 98.7 F (37.1 C) 80 16 125/84 95 %   02/10/19 2310 97.3 F (36.3 C) 79 18 (!) 134/92 94 %     Last 3 Recorded Weights in this Encounter    02/10/19 0919 02/11/19 0313   Weight: 79.4 kg (175 lb) 83.5 kg (184 lb)         Problem: Falls - Risk of  Goal: *Absence of Falls  Description: Document Schmid Fall Risk and appropriate interventions in the flowsheet.  Outcome: Progressing Towards Goal  Note: Fall Risk Interventions:  Mobility Interventions: Patient to call before getting OOB     Medication Interventions: Patient to call before getting OOB, Teach patient to arise slowly    Elimination Interventions: Call light in reach, Patient to call for help with toileting needs, Stay With Me (per policy)          Problem: Pressure Injury - Risk of  Goal: *Prevention of pressure injury  Description: Document Braden Scale and appropriate interventions in the flowsheet.  Outcome: Progressing Towards Goal  Note: Pressure Injury Interventions:        Activity Interventions: Increase time out of bed, Pressure redistribution bed/mattress(bed type)    Mobility Interventions: Assess need for specialty bed, Pressure redistribution bed/mattress (bed type)    Nutrition Interventions: Document food/fluid/supplement intake             Problem: Activity Intolerance  Goal: *Oxygen saturation during activity within specified parameters  Outcome: Progressing Towards Goal  Goal: *Able to remain out of bed as prescribed  Outcome: Progressing Towards Goal     Problem: Anxiety  Goal: *Alleviation of anxiety  Outcome: Progressing Towards Goal

## 2019-02-11 NOTE — Progress Notes (Signed)
Progress  Notes by Cathren Harsh, MD at 02/11/19 1059                Author: Cathren Harsh, MD  Service: Internal Medicine  Author Type: Physician       Filed: 02/11/19 1116  Date of Service: 02/11/19 1059  Status: Signed          Editor: Cathren Harsh, MD (Physician)                    [facility]                                                                                              Hospitalist Progress Note   Cathren Harsh, MD   Answering service: [phone] OR [phone] from in house phone            Date of Service:  02/11/2019   NAME:  John Friedman   DOB:  January 27, 1966   MRN:  [id number]         Please note that this dictation was completed with Dragon, the computer voice recognition software.  Quite often unanticipated grammatical, syntax, homophones, and other interpretive errors are inadvertently transcribed by the computer  software.  Please disregard these errors.  Please excuse any errors that have escaped final proofreading.            Admission Summary:               53 year old Caucasian male with a past medical history of end stage renal disease, on hemodialysis on Mondays, Wednesdays, and Fridays; history of testicular cancer in 1987; hypothyroidism, he comes over here because since his last 4 days, he has been  having chest pain.  He describes his chest pain as retrosternal pressure like, 3/10 in intensity, nonradiating, off and on, no relation with breathing as well as physical exertion.  He claims that along with that he also has some dry cough with some  phlegm.  No fever.  No nasal congestion, but he did have nausea but no vomiting.  No changes in bowel movements.   ??   When specifically asking, the patient does mention that about 3 years ago, he had a stress test done that according to him was negative.  He has been getting  dialysis as usual and has not missed any dialysis so far.  He also feels that he is quite anx      Interval history /  Subjective:             Patient was recently discharged on 6/12 for shortness of breath.  Considered to be volume overload and was dialyzed, he was tested COVID twice negative then      According to him , his chest pain is better    But he is upset about about things not getting done today              Assessment & Plan:            Chest pain from typical and atypical feature  patient under observation telemetry.   Chest x-ray with mild bibasilar airspace disease and on azithromycin for that   It very well could be atelectasis given absence of leukocytosis and fever   Some chronic changes seen   Cardiology on board   COVID test negative   Plan to get stress test, defer to cardiology   Last echo showed a small pericardial effusion and was treated with the volume removal with hemodialysis, may need a repeat echo   His chest pain and shortness of breath has become recurrent at this point           End stage renal disease, on hemodialysis on Mondays, Wednesdays, and Fridays.     Nephrology consult placed   He needs to get dialysis today          Hypothyroidism.   Last TSH was 22   I will repeat one today       Essential tremor   , stable.      Chronic fatigue syndrome         Code status: full    DVT prophylaxis:       Care Plan discussed with: Patient/Family   Anticipated Disposition: Home w/Family   Anticipated Discharge: 24 hours to 48 hours           Hospital Problems   Date Reviewed:  02/06/2019                         Codes  Class  Noted  POA              SOB (shortness of breath)  ICD-10-CM: R06.02   ICD-9-CM: 786.05    02/10/2019  Unknown                               Review of Systems:     A comprehensive review of systems was negative except for that written in the HPI.            Vital Signs:      Last 24hrs VS reviewed since prior progress note. Most recent are:   Visit Vitals      BP  144/85     Pulse  88     Temp  98.3 ??F (36.8 ??C)     Resp  20     Ht  5\' 11"  (1.803 m)     Wt  83.5 kg (184 lb)     SpO2   95%        BMI  25.66 kg/m??              Intake/Output Summary (Last 24 hours) at 02/11/2019 1059   Last data filed at 02/10/2019 2335     Gross per 24 hour        Intake  250 ml        Output  1200 ml        Net  -950 ml              Physical Examination:                     Constitutional:   No acute distress,      ENT:   Oral mucosa moist, oropharynx benign.      Resp:   CTA bilaterally.      CV:   Regular rhythm, normal rate, no murmurs, gallops,  rubs      GI:   Soft,          Musculoskeletal:   No edema         Neurologic:   Moves all extremities.                       Data Review:      Review and/or order of clinical lab test           Labs:          Recent Labs            02/11/19   0241  02/10/19   0933     WBC  4.2  6.0     HGB  7.9*  8.9*     HCT  23.2*  26.6*         PLT  125*  164          Recent Labs            02/11/19   0241  02/10/19   0933     NA  130*  130*     K  3.0*  3.1*     CL  93*  92*     CO2  25  26     BUN  37*  28*     CREA  7.80*  6.67*     GLU  100  94         CA  7.5*  7.9*          Recent Labs            02/11/19   0241  02/10/19   0933     ALT  28  29     AP  84  94     TBILI  0.5  0.6     TP  5.7*  6.6     ALB  3.2*  3.6         GLOB  2.5  3.0        No results for input(s): INR, PTP, APTT, INREXT in the last 72 hours.    No results for input(s): FE, TIBC, PSAT, FERR in the last 72 hours.    No results found for: FOL, RBCF    No results for input(s): PH, PCO2, PO2 in the last 72 hours.     Recent Labs            02/10/19   1832  02/10/19   0933         TROIQ  <0.05  <0.05          Lab Results         Component  Value  Date/Time            Cholesterol, total  184  04/09/2018 07:25 AM       HDL Cholesterol  52  04/09/2018 07:25 AM       LDL, calculated  120 (H)  04/09/2018 07:25 AM            Triglyceride  59  04/09/2018 07:25 AM          Lab Results         Component  Value  Date/Time            Glucose (POC)  87  06/26/2013 02:54 AM          Lab Results  Component  Value   Date/Time            Color  YELLOW/STRAW  02/05/2019 07:52 PM       Appearance  CLEAR  02/05/2019 07:52 PM       Specific gravity  1.006  02/05/2019 07:52 PM       pH (UA)  >8.5 (H)  02/05/2019 07:52 PM       Protein  100 (A)  02/05/2019 07:52 PM       Glucose  Negative  02/05/2019 07:52 PM       Ketone  Negative  02/05/2019 07:52 PM       Bilirubin  Negative  02/05/2019 07:52 PM       Urobilinogen  0.2  02/05/2019 07:52 PM       Nitrites  Negative  02/05/2019 07:52 PM       Leukocyte Esterase  MODERATE (A)  02/05/2019 07:52 PM       Epithelial cells  FEW  02/05/2019 07:52 PM       Bacteria  Negative  02/05/2019 07:52 PM       WBC  10-20  02/05/2019 07:52 PM            RBC  5-10  02/05/2019 07:52 PM                Medications Reviewed:          Current Facility-Administered Medications          Medication  Dose  Route  Frequency           ?  clonazePAM (KlonoPIN) tablet 1 mg   1 mg  Oral  BID     ?  propranoloL (INDERAL) tablet 20 mg   20 mg  Oral  DAILY     ?  liothyronine (CYTOMEL) tablet 5 mcg   5 mcg  Oral  DAILY     ?  pregabalin (LYRICA) capsule 50 mg   50 mg  Oral  QHS     ?  sodium chloride (NS) flush 5-40 mL   5-40 mL  IntraVENous  Q8H     ?  sodium chloride (NS) flush 5-40 mL   5-40 mL  IntraVENous  PRN     ?  acetaminophen (TYLENOL) tablet 650 mg   650 mg  Oral  Q4H PRN     ?  ondansetron (ZOFRAN) injection 4 mg   4 mg  IntraVENous  Q4H PRN           ?  azithromycin (ZITHROMAX) 500 mg in 0.9% sodium chloride (MBP/ADV) 250 mL   500 mg  IntraVENous  Q24H           ?  tamsulosin (FLOMAX) capsule 0.4 mg   0.4 mg  Oral  DAILY           ?  clomiPRAMINE (ANAFRANIL) capsule 150 mg   150 mg  Oral  QHS        ______________________________________________________________________   EXPECTED LENGTH OF STAY: - - -   ACTUAL LENGTH OF STAY:          0                    Cathren Harsh, MD

## 2019-02-11 NOTE — Progress Notes (Signed)
 1500: Spoke with Dr. Sarai about patient's chronic foley, orders to continue foley and no s/s of UTI or fever so do not change it out.    TRANSFER - OUT REPORT:    Verbal report given to [facility] (name) on John Friedman  being transferred to [facility](unit) for routine progression of care       Report consisted of patient's Situation, Background, Assessment and   Recommendations(SBAR).     Information from the following report(s) SBAR, Kardex, Procedure Summary, Intake/Output, MAR, Recent Results and Cardiac Rhythm SR was reviewed with the receiving nurse.    Lines:   Peripheral IV 02/10/19 Left Forearm (Active)   Site Assessment Clean, dry, & intact 02/11/2019  4:00 PM   Phlebitis Assessment 0 02/11/2019  4:00 PM   Infiltration Assessment 0 02/11/2019  4:00 PM   Dressing Status Clean, dry, & intact 02/11/2019  4:00 PM   Dressing Type Transparent;Tape 02/11/2019  4:00 PM   Hub Color/Line Status Pink;End cap changed;Flushed 02/11/2019  4:00 PM   Action Taken Open ports on tubing capped 02/11/2019  4:00 PM   Alcohol Cap Used Yes 02/11/2019  4:00 PM        Opportunity for questions and clarification was provided.      Patient transported with:   Tech              Problem: Falls - Risk of  Goal: *Absence of Falls  Description: Document Schmid Fall Risk and appropriate interventions in the flowsheet.  Outcome: Progressing Towards Goal  Note: Fall Risk Interventions:  Mobility Interventions: Assess mobility with egress test, Communicate number of staff needed for ambulation/transfer, Patient to call before getting OOB, Strengthening exercises (ROM-active/passive), Utilize walker, cane, or other assistive device, Utilize gait belt for transfers/ambulation    Medication Interventions: Evaluate medications/consider consulting pharmacy, Patient to call before getting OOB, Teach patient to arise slowly, Utilize gait belt for transfers/ambulation    Elimination Interventions: Call light in reach, Patient to call for help with toileting  needs, Stay With Me (per policy), Toilet paper/wipes in reach, Toileting schedule/hourly rounds, Urinal in reach    Problem: Activity Intolerance  Goal: *Oxygen saturation during activity within specified parameters  Outcome: Progressing Towards Goal  Note: Patient weaned to room air, oxygen saturations greater than 92%, dyspnea on exertion.     Problem: Anxiety  Goal: *Alleviation of anxiety  Outcome: Progressing Towards Goal  Note: Patient BLE tremor, PO clonazepam ordered.      Problem: Chronic Renal Failure  Goal: *Fluid and electrolytes stabilized  Outcome: Progressing Towards Goal  Note: Nephrology consulted, order for HD today

## 2019-02-11 NOTE — Other (Signed)
[facility]  [phone]    Vitals   Pre   Post   Assessment   Pre   Post     Temp  98.6  98.6   LOC  AXOX3 axox3   HR   79 83   Lungs   CLEAR CLEAR   B/P   119/83 133/86   Cardiac   RRR RRR   Resp   18 23   Skin   WARM DRY INTACT  WARM DRY INTACT   Pain level  0 0   Edema  GENERALIZED GENERALIZED     Orders:    Duration:   Start:    1400 End:   1715 Total:   3HRS15MIN   Dialyzer:   Dialyzer/Set Up Inspection: Revaclear (02/11/19 1355)   K Bath:   Dialysate K (mEq/L): 4 (02/11/19 1355)   Ca Bath:   Dialysate CA (mEq/L): 2.5 (02/11/19 1355)   Na/Bicarb:   Dialysate NA (mEq/L): 140 (02/11/19 1355)   Target Fluid Removal:   Goal/Amount of Fluid to Remove (mL): 2000 mL (02/11/19 1355)   Access     Type & Location:   Right cvc, dressing, non dated. site without redness or drainage, Each catheter limb disinfected for 60 seconds per limb with alcohol swabs. Caps removed, dialysis CVC hub scrubbed with Prevantics for 5 seconds, followed by a 5 second dry time per Hospital P&P.  ??   Labs     Obtained/Reviewed   Critical Results Called   Date when labs were drawn-  Hgb-    HGB   Date Value Ref Range Status   02/11/2019 7.9 (L) 12.1 - 17.0 g/dL Final     K-    Potassium   Date Value Ref Range Status   02/11/2019 3.0 (L) 3.5 - 5.1 mmol/L Final     Ca-   Calcium   Date Value Ref Range Status   02/11/2019 7.5 (L) 8.5 - 10.1 MG/DL Final     Bun-   BUN   Date Value Ref Range Status   02/11/2019 37 (H) 6 - 20 MG/DL Final     Creat-   Creatinine   Date Value Ref Range Status   02/11/2019 7.80 (H) 0.70 - 1.30 MG/DL Final        Medications/ Blood Products Given     Name   Dose   Route and Time     Heparin 1:1000  3600 units 1.8 arterial, 1.8 venous. Post HD tx             Blood Volume Processed (BVP):    71.3     Net Fluid   Removed:  2053ml   Comments   Time Out Done: yes,13:55  Primary Nurse Rpt OEV:OJJK Ulanda Edison, RN  Primary Nurse Rpt Post:Dori Ulanda Edison, RN  Pt Education:Tx plan, Diet and Potassium    Care Plan:[name] to continue tx. Outpatient at a Dialysis clinic  Tx Summary:  Each catheter limb disinfected for 60 seconds per limb with alcohol swabs. Dialysis CVC hubs scrubbed with Prevantics for 5 seconds, followed by a 5 second dry time per Hospital P&P, flushed, heparinized, red and blue dialysis caps applied to ports.  SBAR to primary nurse.   Admiting Diagnosis:SOB, fatigue  Pt's previous clinic-  Consent signed - Informed Consent Verified: Yes (02/11/19 1355)  [facility] Consent - OBTAINED  Hepatitis Status- NEG 6/5  Machine #- Machine Number: [id number] (02/11/19 1355)  Telemetry status-bed side  Pre-dialysis wt.-  79.5kg

## 2019-02-11 NOTE — ACP (Advance Care Planning) (Signed)
Advance Care Planning     Advance Care Planning Activator (Inpatient)  Conversation Note      Date of ACP Conversation: 02/11/19     Conversation Conducted with:   Patient with Decision Making Capacity    ACP Activator: [place] Neighbors makes decisions on behalf of the incapacitated patient: Decision Maker is asked to consider and make decisions based on patient values, known preferences, or best interests.     Health Care Decision Maker:    Current Designated Health Care Decision Maker:   Primary Decision Maker: Zada Girt - Parent [phone]    Secondary Decision Maker: Kandace Parkins (biological mother) - Other Non-relative - [phone]    Supplemental (Other) Decision Maker: Jaken, Fregia - Sister [phone]    Patient would want Secondary and Supplemental decision makers to jointly make decisions    Care Preferences    Ventilation:  "If you were in your present state of health and suddenly became very ill and were unable to breathe on your own, what would your preference be about the use of a ventilator (breathing machine) if it were available to you?"  YES    "If your health worsens and it becomes clear that your chance of recovery is unlikely, what would your preference be about the use of a ventilator (breathing machine) if it were available to you?" YES    Resuscitation  "CPR works best to restart the heart when there is a sudden event, like a heart attack, in someone who is otherwise healthy. Unfortunately, CPR does not typically restart the heart for people who have serious health conditions or who are very sick." YES      []  Yes  [x]  No   Educated Patient / Decision Maker regarding differences between Advance Directives and portable DNR orders.    Length of ACP Conversation in minutes:  10    Conversation Outcomes:  [x]  ACP discussion completed  []  Existing advance directive reviewed with patient; no changes to patient's previously recorded wishes      []  New Advance Directive completed   []  Portable Do Not Resuscitate prepared for Provider review and signature  []  POLST/POST/MOLST/MOST prepared for Provider review and signature      Follow-up plan:    []  Schedule follow-up conversation to continue planning  []  Referred individual to Provider for additional questions/concerns   []  Advised patient/agent/surrogate to review completed ACP document and update if needed with changes in condition, patient preferences or care setting     []  This note routed to one or more involved healthcare providers      CRM: Irving Shows, MPH, [place]; Z: [phone]

## 2019-02-11 NOTE — Consults (Signed)
NEPHROLOGY CONSULT NOTE     Patient: John Friedman MRN: [id number]  PCP: Antony Haste, MD   DOB:     Dec 24, 1965  Age:   53 y.o.  Sex:  male      Referring physician: Cathren Harsh, MD  Reason for consultation: 53 y.o. male with SOB (shortness of breath) [P32.95] complicated by AKI   Admission Date: 02/10/2019  9:24 AM  LOS: 0 days      ASSESSMENT and PLAN :   AKI:  - unclear cause, progressive CKD vs obstructive uropathy  - HD MWF at [employer] [facility] today  - orders in and [facility] aware  ??  SOB and Chest pain  - SARS-CoV-2 neg  - per cardiology  ??  Anemia of CKD:  - ESA w/ HD while here  ??  Obstructive uropathy:  - foley in place  - f/u w/ urology as an outpt    PNA:  - on azithro    Anxiety:  - on klonopin     Active Problems / Assessment AAActive  :   Active Problems:    SOB (shortness of breath) (02/10/2019)         Subjective:   HPI: John Friedman is a 53 y.o. Caucasian male who has been admitted to the hospital for SOB and chest pain.  He has AKI, likely obstructive uropathy and has been on HD MWF at [place].  He presented last night with SOB.  He is COVID neg this admission and in the past.  His CXR shows b/l airspace disease.  He did have a thoracentesis last admission.  He is feeling ok.  Stable O2 on RA now.  No cp or sob reported.  No n/v/d, fevers or chills    Past Medical Hx:   Past Medical History:   Diagnosis Date   ??? Anemia 01/21/2019   ??? ARF (acute renal failure) (HCC)     on HD   ??? BPH (benign prostatic hyperplasia)    ??? Cancer ([place])     left testicle- stage III, nonseminomatous germ cell tumor, s/p orchiectomy & BEP x 3 cycles   ??? Chronic fatigue January 1987   ??? Chronic kidney disease     renal failure, HD   ??? Essential tremor    ??? GERD (gastroesophageal reflux disease)    ??? 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 Hyponatremia 01/21/2019   ??? Hypothyroid    ??? 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 Lumbar radiculopathy 08/10/2012     S/p steroid injection by Dr Laverta Daggett 08/01/12    ??? Normal cardiac stress test 09/05/11   ??? Pleural effusion, bilateral 01/21/2019   ??? Pulmonary edema 01/21/2019   ??? Sleep disturbances     acting out in his sleep   ??? Thrombocytopenia ([place]) 01/21/2019        Past Surgical Hx:     Past Surgical History:   Procedure Laterality Date   ??? HX CHOLECYSTECTOMY  12/05/2011    abnormal HIDA scan (EF < 2%)   ??? HX COLONOSCOPY  07/12/10    hyperplastic polyp, repeat 10 yrs   ??? HX COLONOSCOPY  02/23/15    normal   ??? HX CYST REMOVAL Right 01/06/2016    3rd toe ganglion cyst removal - podiatry (Dr Raliegh Ip)   ??? HX GI  09/09/11    EGD- normal   ??? HX HEENT Left  06/2012    tonsilar bx: benign tissue (Dr Buddy Duty)   ??? HX ORCHIECTOMY Left 2002   ??? 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 HX SHOULDER ARTHROSCOPY Right 08/13/2018       Medications:  Prior to Admission medications    Medication Sig Start Date End Date Taking? Authorizing Provider   ondansetron hcl (ZOFRAN) 4 mg tablet Take 1 Tab by mouth every eight (8) hours as needed for Nausea or Vomiting. 02/06/19  Yes Antony Haste, MD   clomiPRAMINE (ANAFRANIL) 50 mg capsule Take 150 mg by mouth nightly.   Yes Provider, Historical   tamsulosin (Flomax) 0.4 mg capsule Take 0.4 mg by mouth daily.   Yes Provider, Historical   propranolol (INDERAL) 20 mg tablet Take 20 mg by mouth daily. For tremors   Yes Provider, Historical   clonazepam (KLONOPIN) 1 mg tablet Take 1 mg by mouth two (2) times a day.   Yes Provider, Historical   pregabalin (LYRICA) 50 mg capsule Take 1 Cap by mouth nightly. Max Daily Amount: 50 mg. For "sleep disturbances" - pt has had injuries from falling out of bed during sleep 01/29/19   Antony Haste, MD   liothyronine (CYTOMEL) 5 mcg tablet Take 5 mcg by mouth daily.    Provider, Historical       Allergies   Allergen Reactions   ??? Latex Other (comments)     redness       Social Hx:  reports that he has never smoked. He has never used smokeless  tobacco. He reports current alcohol use. He reports that he does not use drugs.     Family History   Problem Relation Age of Onset   ??? No Known Problems Mother    ??? Parkinsonism Father    ??? Arthritis-osteo Father         scoliosis   ??? Dementia Father    ??? No Known Problems Brother    ??? No Known Problems Brother    ??? No Known Problems Sister    ??? Cancer Maternal Grandfather         prostate   ??? Cancer Paternal Grandfather         prostate       Review of Systems:  A twelve point review of system was performed today. Pertinent positives and negatives are mentioned in the HPI. The reminder of the ROS is negative and noncontributory.     Objective:    Vitals:    Vitals:    02/11/19 0313 02/11/19 0722 02/11/19 1006 02/11/19 1105   BP: 125/84 (!) 150/92 144/85 123/86   Pulse: 80 85 88 78   Resp: 16 16 20 16    Temp: 98.7 ??F (37.1 ??C) 98.3 ??F (36.8 ??C)  98.2 ??F (36.8 ??C)   SpO2: 95% 97% 95% 95%   Weight: 83.5 kg (184 lb)      Height:         I&O's:  06/28 0701 - 06/29 0700  In: 250 [I.V.:250]  Out: 1200 [Urine:1200]  Visit Vitals  BP 123/86 (BP 1 Location: Right arm, BP Patient Position: At rest)   Pulse 78   Temp 98.2 ??F (36.8 ??C)   Resp 16   Ht 5\' 11"  (1.803 m)   Wt 83.5 kg (184 lb)   SpO2 95%   BMI 25.66 kg/m??       Physical Exam:  General:Alert, No distress,   HEENT:  Eyes are PERRL.  Conjunctiva without pallor ,erythema.  The sclerae without icterus. .   Neck:Supple,no mass palpable  Lungs : Clears to auscultation Bilaterally, Normal respiratory effort  CVS: RRR, S1 S2 normal, No rub, no LE edema  Abdomen: Soft, Non tender, No hepatosplenomegaly, bowel sounds present  Extremities: No cyanosis, No clubbing  Skin: No rash or lesions.  Lymph nodes: No palpable nodes  MS: No joint swelling, erythema, warmth  Neurologic: non focal, AAO x 3  Psych: normal affect  GU: foley in place    Laboratory Results:    Lab Results   Component Value Date    BUN 37 (H) 02/11/2019    NA 130 (L) 02/11/2019    K 3.0 (L) 02/11/2019     CL 93 (L) 02/11/2019    CO2 25 02/11/2019       Lab Results   Component Value Date    BUN 37 (H) 02/11/2019    BUN 28 (H) 02/10/2019    BUN 21 (H) 02/05/2019    BUN 13 01/27/2019    BUN 15 01/26/2019    K 3.0 (L) 02/11/2019    K 3.1 (L) 02/10/2019    K 3.0 (L) 02/05/2019    K 3.6 01/27/2019    K 3.5 01/26/2019       Lab Results   Component Value Date    WBC 4.2 02/11/2019    RBC 2.49 (L) 02/11/2019    HGB 7.9 (L) 02/11/2019    HCT 23.2 (L) 02/11/2019    MCV 93.2 02/11/2019    MCH 31.7 02/11/2019    RDW 13.4 02/11/2019    PLT 125 (L) 02/11/2019       Lab Results   Component Value Date    PHOS 4.1 02/05/2019       Urine dipstick:   Lab Results   Component Value Date/Time    Color YELLOW/STRAW 02/05/2019 07:52 PM    Appearance CLEAR 02/05/2019 07:52 PM    Specific gravity 1.006 02/05/2019 07:52 PM    pH (UA) >8.5 (H) 02/05/2019 07:52 PM    Protein 100 (A) 02/05/2019 07:52 PM    Glucose Negative 02/05/2019 07:52 PM    Ketone Negative 02/05/2019 07:52 PM    Bilirubin Negative 02/05/2019 07:52 PM    Urobilinogen 0.2 02/05/2019 07:52 PM    Nitrites Negative 02/05/2019 07:52 PM    Leukocyte Esterase MODERATE (A) 02/05/2019 07:52 PM    Epithelial cells FEW 02/05/2019 07:52 PM    Bacteria Negative 02/05/2019 07:52 PM    WBC 10-20 02/05/2019 07:52 PM    RBC 5-10 02/05/2019 07:52 PM       I have reviewed the following:   All pertinent labs, microbiology data, radiology imaging for my assessment         Thank you for allowing [place] to participate in the care of this patient.   We will follow patient. Please don???t hesitate to call with any questions    Willette Cluster, MD  02/11/2019        [facility] Nephrology [facility]   [address], [place] [address]  Phone - [phone]   Fax - [phone]  [email or web address]

## 2019-02-11 NOTE — Progress Notes (Addendum)
1500: Spoke with Dr. Lizbeth Bark about patient's chronic foley, orders to continue foley and no s/s of UTI or fever so do not change it out.    TRANSFER - OUT REPORT:    Verbal report given to [facility] (name) on John Friedman  being transferred to [facility](unit) for routine progression of care       Report consisted of patient???s Situation, Background, Assessment and   Recommendations(SBAR).     Information from the following report(s) SBAR, Kardex, Procedure Summary, Intake/Output, MAR, Recent Results and Cardiac Rhythm SR was reviewed with the receiving nurse.    Lines:   Peripheral IV 02/10/19 Left Forearm (Active)   Site Assessment Clean, dry, & intact 02/11/2019  4:00 PM   Phlebitis Assessment 0 02/11/2019  4:00 PM   Infiltration Assessment 0 02/11/2019  4:00 PM   Dressing Status Clean, dry, & intact 02/11/2019  4:00 PM   Dressing Type Transparent;Tape 02/11/2019  4:00 PM   Hub Color/Line Status Pink;End cap changed;Flushed 02/11/2019  4:00 PM   Action Taken Open ports on tubing capped 02/11/2019  4:00 PM   Alcohol Cap Used Yes 02/11/2019  4:00 PM        Opportunity for questions and clarification was provided.      Patient transported with:   Tech              Problem: Falls - Risk of  Goal: *Absence of Falls  Description: Document Schmid Fall Risk and appropriate interventions in the flowsheet.  Outcome: Progressing Towards Goal  Note: Fall Risk Interventions:  Mobility Interventions: Assess mobility with egress test, Communicate number of staff needed for ambulation/transfer, Patient to call before getting OOB, Strengthening exercises (ROM-active/passive), Utilize walker, cane, or other assistive device, Utilize gait belt for transfers/ambulation    Medication Interventions: Evaluate medications/consider consulting pharmacy, Patient to call before getting OOB, Teach patient to arise slowly, Utilize gait belt for transfers/ambulation    Elimination Interventions: Call light in reach, Patient to call for help  with toileting needs, Stay With Me (per policy), Toilet paper/wipes in reach, Toileting schedule/hourly rounds, Urinal in reach    Problem: Activity Intolerance  Goal: *Oxygen saturation during activity within specified parameters  Outcome: Progressing Towards Goal  Note: Patient weaned to room air, oxygen saturations greater than 92%, dyspnea on exertion.     Problem: Anxiety  Goal: *Alleviation of anxiety  Outcome: Progressing Towards Goal  Note: Patient BLE tremor, PO clonazepam ordered.      Problem: Chronic Renal Failure  Goal: *Fluid and electrolytes stabilized  Outcome: Progressing Towards Goal  Note: Nephrology consulted, order for HD today

## 2019-02-11 NOTE — Progress Notes (Addendum)
Assessment/Plan/Discussion:Cardiology Attending:     Patient seen on the day of progress note and examined  and agree with Advance Practice Provider (APP, NP,PA)  assessment and plans.   John Friedman is a 53 y.o. male   Seen in afternoon while getting hemodialysis   Talkative pleasant gentleman recently started on HD in past month  Admits to lots of water intake for many years and still takes in several liters of H2O daily  Atypical CP with normal trop, normal EF, prev normal stress test  Likely relates to water intake,exam CV ok  Will see back PRN  Can fu in office 2 months and monitor for any ischemic sx  Cammy Copa, MD                                                                                  Cardiology Progress Note            Admit Date: 02/10/2019  Admit Diagnosis: SOB (shortness of breath) [R06.02]  Date: 02/11/2019     Time: 2:17 PM    COVID NEGATIVE.    Subjective:  Feeling better today.  No longer on isolation.  COVID negative.  No longer with chest pressure or SOB with laying flat.     Assessment and Plan     1. Chest pressure   - atypical with some typical features   - resolved   - Troponin 0.05 x 2   - EKG with NSR.  No evidence for ACS   - CXR with edema and airspace disease  2. ESRD   - Nephrology following   - getting HD now   - ? Dry weight with continued effusions and SOB - need to pull more on dialysis?   - ESRD etiology unclear  3. Hypothyroidism   - TSH elevated   - He has not started his Cytomel as prescribed as outpatient   - Started as inpatient  4. CFS   - contributing to presentation  5. Anxiety   - Seems high strung   - Psyc consulted by Primary team  6. Elevated proBNP   - 2/2 ESRD  01/25/19   ECHO ADULT FOLLOW-UP OR LIMITED 01/26/2019 01/26/2019    Narrative ?? Normal cavity size, wall thickness and systolic function (ejection   fraction normal). Estimated left ventricular ejection fraction is 55 -   60%. Visually measured ejection fraction.   ?? Trivial pericardial effusion. Effusion is fluid.        Signed by: Shelle Iron, MD    - no evidence for HF  7. Anemia   - as related to ESRD    No longer with chest pressure or orthopnea.  CXR with edema/effusion and airspace disease.  Normal troponin and EKG.  Suspect SOB/pressure not cardiac related.  Stress echo 2018 normal.  Getting HD at this time, suspect needs more fluid pulled with dialysis.  No acute indication at this time for ischemic work up.  TSH is elevated.  D/w him about starting his prescribed meds as directed.        No results found for: CRES   Past Medical History:   Diagnosis Date   ??? Anemia  01/21/2019   ??? ARF (acute renal failure) (HCC)     on HD   ??? BPH (benign prostatic hyperplasia)    ??? Cancer ([place])     left testicle- stage III, nonseminomatous germ cell tumor, s/p orchiectomy & BEP x 3 cycles   ??? Chronic fatigue January 1987   ??? Chronic kidney disease     renal failure, HD   ??? Essential tremor    ??? GERD (gastroesophageal reflux disease)    ??? 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 Hyponatremia 01/21/2019   ??? Hypothyroid    ??? 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 Lumbar radiculopathy 08/10/2012    S/p steroid injection by Dr Laverta Ilion 08/01/12    ??? Normal cardiac stress test 09/05/11   ??? Pleural effusion, bilateral 01/21/2019   ??? Pulmonary edema 01/21/2019   ??? Sleep disturbances     acting out in his sleep   ??? Thrombocytopenia ([place]) 01/21/2019      Social History     Tobacco Use   ??? Smoking status: Never Smoker   ??? Smokeless tobacco: Never Used   Substance Use Topics   ??? Alcohol use: Yes     Frequency: Never     Binge frequency: Never     Comment: rarely   ??? Drug use: No           Review of Systems:    '[]'  Patient unable to provide secondary to condition    '[x]'  All systems negative, except as checked below.  Constitutional:    '[]' Weight Change  '[]' Fever   '[]' Chills   '[]' Night Sweats  '[]' Fatigue  '[]' Malaise  '[]' ____  ENT/Mouth:      '[]' Hearing Changes  '[]' Ear Pain  '[]' Nasal Congestion   '[]' Sinus Pain  '[]' Hoarseness   '[]' Sore throat  '[]' Rhinorrhea  '[]' Swallowing Difficulty  '[]' ____  Eyes:    '[]' Eye Pain  '[]' Swelling  '[]' Redness  '[]' Foreign Body  '[]' Discharge  '[]' Vision Changes  '[]' ____  Cardiovascular:    '[]' Chest Pain  '[]' SOB  '[]' PND  '[]' DOE  '[]' Orthopnea  '[]' Claudication  '[]' Edema   '[]' Palpitations  '[]' ____  Respiratory:    '[]' Cough  '[]' Sputum  '[]' Wheezing,  '[]' SOB  '[]' Hemoptysis  '[]' ____  Gastrointestinal:    '[]' Nausea  '[]' Vomiting  '[]' Diarrhea  '[]' Constipation  '[]' Pain  '[]' Heartburn  '[]' Anorexia  '[]' Dysphagia  '[]' Hematochezia  '[]' Melena,  '[]' Jaundice  '[]' ____  Genitourinary:    '[]' Dysuria  '[]' Urinary Frequency  '[]' Hematuria  '[]' Urinary Incontinence  '[]' Urgency  '[]' Flank Pain  '[]' Hesitancy  '[]' ____  Musculoskeletal:    '[]' Arthralgias  '[]' Myalgias  '[]' Joint Swelling  '[]' Joint Stiffness  '[]' Back Pain  '[]' Neck Pain  '[]' ____  Skin:    '[]' Skin Lesions  '[]' Pruritis  '[]' Hair Changes  '[]' Skin rashes  '[]' ____  Neuro:    '[]' Weakness  '[]' Numbness  '[]' Paresthesias  '[]' Loss of Consciousness  '[]' Syncope   '[]' Dizziness  '[]' Headache  '[]' Coordination Changes  '[]' Recent Falls  '[]' ____  Psych:    '[]' Anxiety/Depression  '[]' Insomnia  '[]' Memory Changes  '[]' Violence/Abuse Hx.  '[]' ____  Heme/Lymph:    '[]' Bruising  '[]' Bleeding  '[]' Lymphadenopathy  '[]' ____  Endocrine:    '[]' Polyuria  '[]' Polydipsia  '[]' Temperature Intolerance  '[]' ____         Objective:     Physical Exam:                Visit Vitals  BP 123/86 (BP 1 Location: Right arm, BP Patient Position: At rest)   Pulse 78   Temp 98.2 ??F (36.8 ??  C)   Resp 16   Ht '5\' 11"'  (1.803 m)   Wt 184 lb (83.5 kg)   SpO2 95%   BMI 25.66 kg/m??        General Appearance:   Well developed, well nourished,alert and oriented x 3, and   individual in no acute distress.   Ears/Nose/Mouth/Throat:    Hearing grossly normal.         Neck:  Supple.   Chest:    Lungs clear anterolaterally to auscultation bilaterally.   Cardiovascular:   Regular rate and rhythm, S1, S2 normal, no murmur.    Abdomen:    Soft, non-tender, bowel sounds are active.   Extremities:  No edema bilaterally.    Skin:  Warm and dry.     Telemetry: normal sinus rhythm     Data Review:    Labs:    Recent Results (from the past 24 hour(s))   EKG, 12 LEAD, INITIAL    Collection Time: 02/10/19  6:23 PM   Result Value Ref Range    Ventricular Rate 76 BPM    Atrial Rate 76 BPM    P-R Interval 170 ms    QRS Duration 90 ms    Q-T Interval 470 ms    QTC Calculation (Bezet) 528 ms    Calculated P Axis 50 degrees    Calculated R Axis 51 degrees    Calculated T Axis 61 degrees    Diagnosis       Normal sinus rhythm  Prolonged QT  When compared with ECG of 10-Feb-2019 09:27,  No significant change was found     SED RATE (ESR)    Collection Time: 02/10/19  6:32 PM   Result Value Ref Range    Sed rate, automated 37 (H) 0 - 20 mm/hr   CRP, HIGH SENSITIVITY    Collection Time: 02/10/19  6:32 PM   Result Value Ref Range    CRP, High sensitivity 7.4 mg/L   RHEUMATOID FACTOR, QT    Collection Time: 02/10/19  6:32 PM   Result Value Ref Range    Rheumatoid factor <10 <15 IU/mL   TROPONIN I    Collection Time: 02/10/19  6:32 PM   Result Value Ref Range    Troponin-I, Qt. <0.05 <0.63 ng/mL   METABOLIC PANEL, COMPREHENSIVE    Collection Time: 02/11/19  2:41 AM   Result Value Ref Range    Sodium 130 (L) 136 - 145 mmol/L    Potassium 3.0 (L) 3.5 - 5.1 mmol/L    Chloride 93 (L) 97 - 108 mmol/L    CO2 25 21 - 32 mmol/L    Anion gap 12 5 - 15 mmol/L    Glucose 100 65 - 100 mg/dL    BUN 37 (H) 6 - 20 MG/DL    Creatinine 7.80 (H) 0.70 - 1.30 MG/DL    BUN/Creatinine ratio 5 (L) 12 - 20      GFR est AA 9 (L) >60 ml/min/1.25m    GFR est non-AA 7 (L) >60 ml/min/1.739m   Calcium 7.5 (L) 8.5 - 10.1 MG/DL    Bilirubin, total 0.5 0.2 - 1.0 MG/DL    ALT (SGPT) 28 12 - 78 U/L    AST (SGOT) 14 (L) 15 - 37 U/L    Alk. phosphatase 84 45 - 117 U/L    Protein, total 5.7 (L) 6.4 - 8.2 g/dL    Albumin 3.2 (L) 3.5 - 5.0 g/dL    Globulin 2.5 2.0 -  4.0 g/dL     A-G Ratio 1.3 1.1 - 2.2     CBC WITH AUTOMATED DIFF    Collection Time: 02/11/19  2:41 AM   Result Value Ref Range    WBC 4.2 4.1 - 11.1 K/uL    RBC 2.49 (L) 4.10 - 5.70 M/uL    HGB 7.9 (L) 12.1 - 17.0 g/dL    HCT 23.2 (L) 36.6 - 50.3 %    MCV 93.2 80.0 - 99.0 FL    MCH 31.7 26.0 - 34.0 PG    MCHC 34.1 30.0 - 36.5 g/dL    RDW 13.4 11.5 - 14.5 %    PLATELET 125 (L) 150 - 400 K/uL    MPV 11.1 8.9 - 12.9 FL    NRBC 0.0 0 PER 100 WBC    ABSOLUTE NRBC 0.00 0.00 - 0.01 K/uL    NEUTROPHILS 57 32 - 75 %    LYMPHOCYTES 22 12 - 49 %    MONOCYTES 15 (H) 5 - 13 %    EOSINOPHILS 5 0 - 7 %    BASOPHILS 1 0 - 1 %    IMMATURE GRANULOCYTES 0 0.0 - 0.5 %    ABS. NEUTROPHILS 2.4 1.8 - 8.0 K/UL    ABS. LYMPHOCYTES 0.9 0.8 - 3.5 K/UL    ABS. MONOCYTES 0.6 0.0 - 1.0 K/UL    ABS. EOSINOPHILS 0.2 0.0 - 0.4 K/UL    ABS. BASOPHILS 0.0 0.0 - 0.1 K/UL    ABS. IMM. GRANS. 0.0 0.00 - 0.04 K/UL    DF AUTOMATED     EKG, 12 LEAD, INITIAL    Collection Time: 02/11/19 10:58 AM   Result Value Ref Range    Ventricular Rate 78 BPM    Atrial Rate 78 BPM    P-R Interval 166 ms    QRS Duration 88 ms    Q-T Interval 442 ms    QTC Calculation (Bezet) 503 ms    Calculated P Axis 60 degrees    Calculated R Axis 58 degrees    Calculated T Axis 70 degrees    Diagnosis       Normal sinus rhythm  Prolonged QT  When compared with ECG of 10-Feb-2019 18:23,  No significant change was found     PROCALCITONIN    Collection Time: 02/11/19 12:26 PM   Result Value Ref Range    Procalcitonin 0.14 ng/mL   TSH 3RD GENERATION    Collection Time: 02/11/19 12:26 PM   Result Value Ref Range    TSH 6.46 (H) 0.36 - 3.74 uIU/mL          Radiology:        Current Facility-Administered Medications   Medication Dose Route Frequency   ??? epoetin alfa-epbx (RETACRIT) injection 20,000 Units  20,000 Units SubCUTAneous DIALYSIS MON, WED & FRI   ??? clonazePAM (KlonoPIN) tablet 1 mg  1 mg Oral BID   ??? propranoloL (INDERAL) tablet 20 mg  20 mg Oral DAILY    ??? liothyronine (CYTOMEL) tablet 5 mcg  5 mcg Oral DAILY   ??? pregabalin (LYRICA) capsule 50 mg  50 mg Oral QHS   ??? sodium chloride (NS) flush 5-40 mL  5-40 mL IntraVENous Q8H   ??? sodium chloride (NS) flush 5-40 mL  5-40 mL IntraVENous PRN   ??? acetaminophen (TYLENOL) tablet 650 mg  650 mg Oral Q4H PRN   ??? ondansetron (ZOFRAN) injection 4 mg  4 mg IntraVENous Q4H PRN   ??? azithromycin (  ZITHROMAX) 500 mg in 0.9% sodium chloride (MBP/ADV) 250 mL  500 mg IntraVENous Q24H   ??? tamsulosin (FLOMAX) capsule 0.4 mg  0.4 mg Oral DAILY   ??? clomiPRAMINE (ANAFRANIL) capsule 150 mg  150 mg Oral QHS       Tyler R. Nadara Mustard, PA-C  Cammy Copa, MD     [facility], [address]   [place], [place]   [phone]

## 2019-02-11 NOTE — Progress Notes (Signed)
TRANSITIONS OF CARE PLAN:   1. DESTINATION: Likely Own Home  2. TRANSPORT: Self - vehicle is on site  3. ADDITIONAL SUPPORT: Family support appears limited  4. DME: None  5. HOME HEALTH: TBD  6. CODE STATUS/AMD STATUS: Full Code; not on file, ACP completed  7. FOLLOW UP APPOINTMENTS: PCP, Nephro  8. STILL NEEDS: PUI COVID, Psych Consult pending, Nephro Consult Pending, Likely HD today, Possible Stress Test    Reason for Admission:   SOB                   RUR Score:          N/A; RRAT: 4 - Patient has had: 1 ED visit 6/23; OBS stay 6/12-6/14; Hypoxemia; INP stay 6/8-6/9, PNA           Plan for utilizing home health:      TBD    PCP: First and Last name:  Dr. Renaee Munda   [facility] of Practice[facility]   Are you a current patient: Yes/No: YES   Approximate date of last visit: 1 week   Can you participate in a virtual visit with your PCP: NO                    Current Advanced Directive/Advance Care Plan: Full Code; not on file - ACP completed                         Transition of Care Plan:                CM spoke with patient by phone, with patient alert and oriented x 4.  Patient identified that he is not taking very good care of himself and that a lot has been going on for him.  It appears that patient has minimal home support.  Patient lives alone in a 5th floor apartment with elevator access.  CM updated emergency contact information per patient's request.  Patient has no hx of HH, IPR, or SNF.  [facility] preference [facility] [address].  With chart review, it appears that patient recently started HD with MWF schedule; facility is not known.  CM discussed OBS status, but patient became flustered and excused self from phone.              Care Management Interventions  PCP Verified by CM: Yes(last seen by Dr. Amedeo Plenty 1 week ago)  Palliative Care Criteria Met (RRAT>21 & CHF Dx)?: No  Mode of Transport at Discharge: Self(vehicle is on site)  Transition of Care Consult (CM Consult): Discharge Planning   MyChart Signup: Yes  Discharge Durable Medical Equipment: No(has no DME)  Health Maintenance Reviewed: Yes(CM spoke with patient, with patient alert and oriented x 4)  Physical Therapy Consult: No  Occupational Therapy Consult: No  Speech Therapy Consult: No  Current Support Network: Own Home, Lives Alone  Confirm Follow Up Transport: Self(independent in ADLs, to include driving and living alone)  [employer] Provided?: No  Discharge Location  Discharge Placement: Unable to determine at this time(lives alone in a 5th floor apartment with elevator access)  CRM: Irving Shows, MPH, [place]; Z: [phone]

## 2019-02-11 NOTE — Progress Notes (Signed)
1910 TRANSFER - IN REPORT:    Verbal report received from [place], Therapist, sports (name) on KENDREW PACI  being received from [facility] (unit) for routine progression of care      Report consisted of patient???s Situation, Background, Assessment and   Recommendations(SBAR).     Information from the following report(s) SBAR and Kardex was reviewed with the receiving nurse.    Opportunity for questions and clarification was provided.      Assessment completed upon patient???s arrival to unit and care assumed.     1930 Bedside shift change report given to [place], Therapist, sports  (Soil scientist) by Claiborne Billings, RN (offgoing nurse). Report included the following information SBAR, Kardex, Intake/Output, MAR and Recent Results.

## 2019-02-11 NOTE — Progress Notes (Signed)
[facility]                                                                                          Hospitalist Progress Note  Cathren Harsh, MD  Answering service: [phone] OR [phone] from in house phone        Date of Service:  02/11/2019  NAME:  John Friedman  DOB:  12-08-1965  MRN:  [id number]      Please note that this dictation was completed with Dragon, the computer voice recognition software.  Quite often unanticipated grammatical, syntax, homophones, and other interpretive errors are inadvertently transcribed by the computer software.  Please disregard these errors.  Please excuse any errors that have escaped final proofreading.       Admission Summary:        53 year old Caucasian male with a past medical history of end stage renal disease, on hemodialysis on Mondays, Wednesdays, and Fridays; history of testicular cancer in 1987; hypothyroidism, he comes over here because since his last 4 days, he has been having chest pain.  He describes his chest pain as retrosternal pressure like, 3/10 in intensity, nonradiating, off and on, no relation with breathing as well as physical exertion.  He claims that along with that he also has some dry cough with some phlegm.  No fever.  No nasal congestion, but he did have nausea but no vomiting.  No changes in bowel movements.  ??  When specifically asking, the patient does mention that about 3 years ago, he had a stress test done that according to him was negative.  He has been getting dialysis as usual and has not missed any dialysis so far.  He also feels that he is quite anx    Interval history / Subjective:       Patient was recently discharged on 6/12 for shortness of breath.  Considered to be volume overload and was dialyzed, he was tested COVID twice negative then    According to him , his chest pain is better   But he is upset about about things not getting done today        Assessment & Plan:        Chest pain from typical and atypical feature   patient under observation telemetry.  Chest x-ray with mild bibasilar airspace disease and on azithromycin for that  It very well could be atelectasis given absence of leukocytosis and fever  Some chronic changes seen  Cardiology on board  COVID test negative  Plan to get stress test, defer to cardiology  Last echo showed a small pericardial effusion and was treated with the volume removal with hemodialysis, may need a repeat echo  His chest pain and shortness of breath has become recurrent at this point        End stage renal disease, on hemodialysis on Mondays, Wednesdays, and Fridays.    Nephrology consult placed  He needs to get dialysis today       Hypothyroidism.  Last TSH was 22  I will repeat one today     Essential tremor  ,  stable.    Chronic fatigue syndrome      Code status: full   DVT prophylaxis:     Care Plan discussed with: Patient/Family  Anticipated Disposition: Home w/Family  Anticipated Discharge: 24 hours to 48 hours     Hospital Problems  Date Reviewed: 2019-02-16          Codes Class Noted POA    SOB (shortness of breath) ICD-10-CM: R06.02  ICD-9-CM: 786.05  02/10/2019 Unknown                Review of Systems:   A comprehensive review of systems was negative except for that written in the HPI.       Vital Signs:    Last 24hrs VS reviewed since prior progress note. Most recent are:  Visit Vitals  BP 144/85   Pulse 88   Temp 98.3 ??F (36.8 ??C)   Resp 20   Ht 5\' 11"  (1.803 m)   Wt 83.5 kg (184 lb)   SpO2 95%   BMI 25.66 kg/m??         Intake/Output Summary (Last 24 hours) at 02/11/2019 1059  Last data filed at 02/10/2019 2335  Gross per 24 hour   Intake 250 ml   Output 1200 ml   Net -950 ml        Physical Examination:             Constitutional:  No acute distress,    ENT:  Oral mucosa moist, oropharynx benign.    Resp:  CTA bilaterally.    CV:  Regular rhythm, normal rate, no murmurs, gallops, rubs    GI:  Soft,     Musculoskeletal:  No edema     Neurologic:  Moves all extremities.             Data Review:    Review and/or order of clinical lab test      Labs:     Recent Labs     02/11/19  0241 02/10/19  0933   WBC 4.2 6.0   HGB 7.9* 8.9*   HCT 23.2* 26.6*   PLT 125* 164     Recent Labs     02/11/19  0241 02/10/19  0933   NA 130* 130*   K 3.0* 3.1*   CL 93* 92*   CO2 25 26   BUN 37* 28*   CREA 7.80* 6.67*   GLU 100 94   CA 7.5* 7.9*     Recent Labs     02/11/19  0241 02/10/19  0933   ALT 28 29   AP 84 94   TBILI 0.5 0.6   TP 5.7* 6.6   ALB 3.2* 3.6   GLOB 2.5 3.0     No results for input(s): INR, PTP, APTT, INREXT in the last 72 hours.   No results for input(s): FE, TIBC, PSAT, FERR in the last 72 hours.   No results found for: FOL, RBCF   No results for input(s): PH, PCO2, PO2 in the last 72 hours.  Recent Labs     02/10/19  1832 02/10/19  0933   TROIQ <0.05 <0.05     Lab Results   Component Value Date/Time    Cholesterol, total 184 04/09/2018 07:25 AM    HDL Cholesterol 52 04/09/2018 07:25 AM    LDL, calculated 120 (H) 04/09/2018 07:25 AM    Triglyceride 59 04/09/2018 07:25 AM     Lab Results   Component Value Date/Time  Glucose (POC) 87 06/26/2013 02:54 AM     Lab Results   Component Value Date/Time    Color YELLOW/STRAW 02/05/2019 07:52 PM    Appearance CLEAR 02/05/2019 07:52 PM    Specific gravity 1.006 02/05/2019 07:52 PM    pH (UA) >8.5 (H) 02/05/2019 07:52 PM    Protein 100 (A) 02/05/2019 07:52 PM    Glucose Negative 02/05/2019 07:52 PM    Ketone Negative 02/05/2019 07:52 PM    Bilirubin Negative 02/05/2019 07:52 PM    Urobilinogen 0.2 02/05/2019 07:52 PM    Nitrites Negative 02/05/2019 07:52 PM    Leukocyte Esterase MODERATE (A) 02/05/2019 07:52 PM    Epithelial cells FEW 02/05/2019 07:52 PM    Bacteria Negative 02/05/2019 07:52 PM    WBC 10-20 02/05/2019 07:52 PM    RBC 5-10 02/05/2019 07:52 PM         Medications Reviewed:     Current Facility-Administered Medications   Medication Dose Route Frequency    ??? clonazePAM (KlonoPIN) tablet 1 mg  1 mg Oral BID   ??? propranoloL (INDERAL) tablet 20 mg  20 mg Oral DAILY   ??? liothyronine (CYTOMEL) tablet 5 mcg  5 mcg Oral DAILY   ??? pregabalin (LYRICA) capsule 50 mg  50 mg Oral QHS   ??? sodium chloride (NS) flush 5-40 mL  5-40 mL IntraVENous Q8H   ??? sodium chloride (NS) flush 5-40 mL  5-40 mL IntraVENous PRN   ??? acetaminophen (TYLENOL) tablet 650 mg  650 mg Oral Q4H PRN   ??? ondansetron (ZOFRAN) injection 4 mg  4 mg IntraVENous Q4H PRN   ??? azithromycin (ZITHROMAX) 500 mg in 0.9% sodium chloride (MBP/ADV) 250 mL  500 mg IntraVENous Q24H   ??? tamsulosin (FLOMAX) capsule 0.4 mg  0.4 mg Oral DAILY   ??? clomiPRAMINE (ANAFRANIL) capsule 150 mg  150 mg Oral QHS     ______________________________________________________________________  EXPECTED LENGTH OF STAY: - - -  ACTUAL LENGTH OF STAY:          0                 Cathren Harsh, MD

## 2019-02-11 NOTE — Progress Notes (Addendum)
Verbal shift change report given to [employer] (oncoming nurse) by Marilynn Latino RN (offgoing nurse). Report included the following information SBAR, Kardex, ED Summary, Intake/Output, MAR, Accordion, Med Rec Status and Cardiac Rhythm NSR.     Patient Vitals for the past 12 hrs:   Temp Pulse Resp BP SpO2   02/11/19 0722 98.3 ??F (36.8 ??C) 85 16 (!) 150/92 97 %   02/11/19 0313 98.7 ??F (37.1 ??C) 80 16 125/84 95 %   02/10/19 2310 97.3 ??F (36.3 ??C) 79 18 (!) 134/92 94 %     Last 3 Recorded Weights in this Encounter    02/10/19 0919 02/11/19 0313   Weight: 79.4 kg (175 lb) 83.5 kg (184 lb)         Problem: Falls - Risk of  Goal: *Absence of Falls  Description: Document Schmid Fall Risk and appropriate interventions in the flowsheet.  Outcome: Progressing Towards Goal  Note: Fall Risk Interventions:  Mobility Interventions: Patient to call before getting OOB     Medication Interventions: Patient to call before getting OOB, Teach patient to arise slowly    Elimination Interventions: Call light in reach, Patient to call for help with toileting needs, Stay With Me (per policy)          Problem: Pressure Injury - Risk of  Goal: *Prevention of pressure injury  Description: Document Braden Scale and appropriate interventions in the flowsheet.  Outcome: Progressing Towards Goal  Note: Pressure Injury Interventions:        Activity Interventions: Increase time out of bed, Pressure redistribution bed/mattress(bed type)    Mobility Interventions: Assess need for specialty bed, Pressure redistribution bed/mattress (bed type)    Nutrition Interventions: Document food/fluid/supplement intake             Problem: Activity Intolerance  Goal: *Oxygen saturation during activity within specified parameters  Outcome: Progressing Towards Goal  Goal: *Able to remain out of bed as prescribed  Outcome: Progressing Towards Goal     Problem: Anxiety  Goal: *Alleviation of anxiety  Outcome: Progressing Towards Goal

## 2019-02-12 LAB — SAMPLES BEING HELD

## 2019-02-12 LAB — ANA, DIRECT, W/REFLEX
ANA, Direct: NEGATIVE
ANA: NEGATIVE

## 2019-02-12 MED ORDER — FLUOXETINE 20 MG CAP
20 mg | Freq: Every day | ORAL | Status: DC
Start: 2019-02-12 — End: 2019-02-12
  Administered 2019-02-12: 16:00:00 via ORAL

## 2019-02-12 MED ORDER — PROPRANOLOL 20 MG TAB
20 mg | ORAL_TABLET | Freq: Two times a day (BID) | ORAL | 0 refills | Status: AC
Start: 2019-02-12 — End: ?

## 2019-02-12 MED ORDER — PROPRANOLOL 20 MG TAB
20 mg | Freq: Two times a day (BID) | ORAL | Status: DC
Start: 2019-02-12 — End: 2019-02-12

## 2019-02-12 MED ORDER — FLUOXETINE 20 MG CAP
20 mg | ORAL_CAPSULE | Freq: Every day | ORAL | 0 refills | Status: AC
Start: 2019-02-12 — End: ?

## 2019-02-12 MED FILL — FLUOXETINE 20 MG CAP: 20 mg | ORAL | Qty: 1

## 2019-02-12 MED FILL — TAMSULOSIN SR 0.4 MG 24 HR CAP: 0.4 mg | ORAL | Qty: 1

## 2019-02-12 MED FILL — NORMAL SALINE FLUSH 0.9 % INJECTION SYRINGE: INTRAMUSCULAR | Qty: 10

## 2019-02-12 MED FILL — CLONAZEPAM 1 MG TAB: 1 mg | ORAL | Qty: 1

## 2019-02-12 MED FILL — LYRICA 25 MG CAPSULE: 25 mg | ORAL | Qty: 2

## 2019-02-12 MED FILL — CYTOMEL 5 MCG TABLET: 5 mcg | ORAL | Qty: 1

## 2019-02-12 MED FILL — PROPRANOLOL 20 MG TAB: 20 mg | ORAL | Qty: 1

## 2019-02-12 NOTE — Progress Notes (Signed)
Problem: Falls - Risk of  Goal: *Absence of Falls  Description: Document John Friedman Fall Risk and appropriate interventions in the flowsheet.  Outcome: Progressing Towards Goal  Note: Fall Risk Interventions:  Mobility Interventions: Assess mobility with egress test, Communicate number of staff needed for ambulation/transfer, Patient to call before getting OOB, Strengthening exercises (ROM-active/passive), Utilize walker, cane, or other assistive device, Utilize gait belt for transfers/ambulation      Medication Interventions: Teach patient to arise slowly, Patient to call before getting OOB    Elimination Interventions: Call light in reach    Problem: Anxiety  Goal: *Alleviation of anxiety  Outcome: Progressing Towards Goal     Problem: Chronic Renal Failure  Goal: *Fluid and electrolytes stabilized  Outcome: Progressing Towards Goal     0730: Bedside and Verbal shift change report given to Imani,RN (Soil scientist) by Reginal Lutes (offgoing nurse). Report included the following information SBAR, Kardex, Intake/Output, MAR, Recent Results, Med Rec Status and Cardiac Rhythm NSR.

## 2019-02-12 NOTE — Progress Notes (Signed)
0730: Bedside shift change report given to [employer] developer) by Weber Cooks (offgoing nurse). Report included the following information SBAR, Kardex, MAR, Recent Results and Cardiac Rhythm NSR.     1215: I have reviewed discharge instructions with the patient.  The patient verbalized understanding.      Problem: Falls - Risk of  Goal: *Absence of Falls  Description: Document Patrcia Dolly Fall Risk and appropriate interventions in the flowsheet.  Outcome: Resolved/Met  Note: Fall Risk Interventions:  Mobility Interventions: Assess mobility with egress test, Communicate number of staff needed for ambulation/transfer, Patient to call before getting OOB, Strengthening exercises (ROM-active/passive), Utilize walker, cane, or other assistive device, Utilize gait belt for transfers/ambulation         Medication Interventions: Teach patient to arise slowly    Elimination Interventions: Call light in reach              Problem: Patient Education: Go to Patient Education Activity  Goal: Patient/Family Education  Outcome: Resolved/Met     Problem: Pressure Injury - Risk of  Goal: *Prevention of pressure injury  Description: Document Braden Scale and appropriate interventions in the flowsheet.  Outcome: Resolved/Met  Note: Pressure Injury Interventions:            Activity Interventions: Increase time out of bed, Pressure redistribution bed/mattress(bed type)    Mobility Interventions: Pressure redistribution bed/mattress (bed type)    Nutrition Interventions: Document food/fluid/supplement intake, Offer support with meals,snacks and hydration                     Problem: Patient Education: Go to Patient Education Activity  Goal: Patient/Family Education  Outcome: Resolved/Met     Problem: Activity Intolerance  Goal: *Oxygen saturation during activity within specified parameters  Outcome: Resolved/Met  Goal: *Able to remain out of bed as prescribed  Outcome: Resolved/Met     Problem: Patient Education: Go to Patient Education  Activity  Goal: Patient/Family Education  Outcome: Resolved/Met     Problem: Anxiety  Goal: *Alleviation of anxiety  Outcome: Resolved/Met  Goal: *Alleviation of anxiety (Palliative Care)  Outcome: Resolved/Met     Problem: Patient Education: Go to Patient Education Activity  Goal: Patient/Family Education  Outcome: Resolved/Met     Problem: Discharge Planning  Goal: *Discharge to safe environment  Description: See CM Notes  CRM: Irving Shows, MPH, [facility]; Z: [phone]     Outcome: Resolved/Met  Goal: *Knowledge of medication management  Outcome: Resolved/Met  Goal: *Knowledge of discharge instructions  Outcome: Resolved/Met     Problem: Chronic Renal Failure  Goal: *Fluid and electrolytes stabilized  Outcome: Resolved/Met     Problem: Patient Education: Go to Patient Education Activity  Goal: Patient/Family Education  Outcome: Resolved/Met

## 2019-02-12 NOTE — Discharge Summary (Signed)
Discharge Summary by Rocco Serene, MD at 02/12/19 [phone]                Author: Rocco Serene, MD  Service: Internal Medicine  Author Type: Physician       Filed: 02/12/19 0940  Date of Service: 02/12/19 0934  Status: Signed          Editor: Rocco Serene, MD (Physician)                       Discharge Summary           PATIENT ID: John Friedman   MRN: [id number]    DATE OF BIRTH: [birth year]     DATE OF ADMISSION: 02/10/2019  9:24 AM     DATE OF DISCHARGE: 02/12/2019    PRIMARY CARE PROVIDER: Antony Haste, MD        ATTENDING PHYSICIAN: Rocco Serene, MD   DISCHARGING PROVIDER: Rocco Serene, MD      To contact this individual call [phone] and ask the operator to page.  If unavailable ask to be transferred the Adult Hospitalist Department.      CONSULTATIONS: IP CONSULT TO NEPHROLOGY   IP CONSULT TO PSYCHIATRY   IP CONSULT TO CARDIOLOGY      PROCEDURES/SURGERIES: * No surgery found *      [place] COURSE:    Re- admitted with chest pain, evaluated by cardiology, non cardiac sounding, sob resolved with dialysis, some anxiety psych eval. Needs f/u with PCP for anxiety/counselling.       Risk of Re-Admission: mod     DISCHARGE DIAGNOSES / PLAN:           Chest pain atypical- patient under observation telemetry.   Chest x-ray with mild bibasilar airspace disease -It very well could be atelectasis given absence of leukocytosis and fever   Cardiology following, no plans for repeat stress test. Reassured likely non cardiac.   COVID test negative   Last echo showed a small pericardial effusion and was treated with the volume removal with hemodialysis. Improved, will f/u with Cardiology - Dr Estill Bamberg in 87months.   ??   ??   ESRD: HD MWF ??Nephrology - HD while inpatient.   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 Getting epogen. F/u with nephrology op.   Hypothyroidism.TSH 6.5, synthroid??   Essential tremor, stable.   Chronic fatigue syndrome   Anxiety disorder: no depression,  no suicidal ideas, no panic, psych eval. F/u op.        FOLLOW UP APPOINTMENTS:       Follow-up Information               Follow up With  Specialties  Details  Why  Contact Info              Antony Haste, MD  Internal Medicine  In 1 week    [address]   [address]   [place] [address]   [phone]                     In 2 months    [facility]- cardiology              ADDITIONAL CARE RECOMMENDATIONS:  Follow up with PCP, cardiology and nephrology      DIET: Resume previous diet      ACTIVITY: Activity as tolerated      DISCHARGE MEDICATIONS:     Current Discharge Medication  List              CONTINUE these medications which have NOT CHANGED          Details        ondansetron hcl (ZOFRAN) 4 mg tablet  Take 1 Tab by mouth every eight (8) hours as needed for Nausea or Vomiting.   Qty: 10 Tab, Refills:  2          Associated Diagnoses: Acute renal failure, unspecified acute renal failure type (HCC)               clomiPRAMINE (ANAFRANIL) 50 mg capsule  Take 150 mg by mouth nightly.               tamsulosin (Flomax) 0.4 mg capsule  Take 0.4 mg by mouth daily.               propranolol (INDERAL) 20 mg tablet  Take 20 mg by mouth daily. For tremors               clonazepam (KLONOPIN) 1 mg tablet  Take 1 mg by mouth two (2) times a day.               pregabalin (LYRICA) 50 mg capsule  Take 1 Cap by mouth nightly. Max Daily Amount: 50 mg. For "sleep disturbances" - pt has had injuries from falling out of bed during sleep   Qty: 30 Cap, Refills:  0          Associated Diagnoses: Sleep disturbances               liothyronine (CYTOMEL) 5 mcg tablet  Take 5 mcg by mouth daily.                      NOTIFY YOUR PHYSICIAN FOR ANY OF THE FOLLOWING:    Fever over 101 degrees for 24 hours.    Chest pain, shortness of breath, fever, chills, nausea, vomiting, diarrhea, change in mentation, falling, weakness, bleeding. Severe pain or pain not relieved by medications.   Or, any other signs or symptoms that you may have questions  about.      DISPOSITION:         Home With:     OT    PT    HH    RN                   Long term SNF/Inpatient Rehab     x  Independent/assisted living          Hospice          Other:        PATIENT CONDITION AT DISCHARGE:       Functional status         Poor        Deconditioned           Independent         Cognition          Lucid        Forgetful           Dementia         Catheters/lines (plus indication)         Foley        PICC        PEG           None         Code status  Full code           DNR         PHYSICAL EXAMINATION AT DISCHARGE:   Patient seen and examined at bedside, Condition stable, explained discharge and follow up plans.   BP 144/86 (BP 1 Location: Right arm, BP Patient Position: At rest)    Pulse 92    Temp 98.1 ??F (36.7 ??C)    Resp 18    Ht 5\' 11"  (1.803 m)    Wt 76 kg (167 lb 8.8 oz)    SpO2 92%    BMI 23.37  kg/m??    General:  Alert, oriented, No acute distress. Axious, pale looking.   Resp:  No accessory muscle use, Good AE.   Neuro:  Grossly normal, no focal neuro deficits, follows commands       CHRONIC MEDICAL DIAGNOSES:      Problem List as of 02/12/2019  Date Reviewed:  02/20/19                        Codes  Class  Noted - Resolved             SOB (shortness of breath)  ICD-10-CM: R06.02   ICD-9-CM: 786.05    02/10/2019 - Present                       Hyponatremia  ICD-10-CM: E87.1   ICD-9-CM: 276.1    01/21/2019 - Present                       Thrombocytopenia ([place])  ICD-10-CM: D69.6   ICD-9-CM: 287.5    01/21/2019 - Present                       Anemia  ICD-10-CM: D64.9   ICD-9-CM: 285.9    01/21/2019 - Present                       Pleural effusion, bilateral  ICD-10-CM: J90   ICD-9-CM: 511.9    01/21/2019 - Present                       History of diastolic dysfunction  JIR-67-EL: Z86.79   ICD-9-CM: V12.59    01/21/2019 - Present          Overview Signed 01/21/2019  1:54 PM by Orlinda Blalock, NP            Chronic                                   Acute renal failure [facility])   ICD-10-CM: N17.9   ICD-9-CM: 584.9    01/20/2019 - Present          Overview Signed 01/20/2019  8:16 PM by Antony Haste, MD            May '20 - due to urinary retention, seen at [facility].  HD on M/W/F.                                   Irritable bowel syndrome with diarrhea  ICD-10-CM: K58.0   ICD-9-CM: 564.1    05/12/2015 - Present  Subclinical hypothyroidism  ICD-10-CM: E03.9   ICD-9-CM: 244.8    Unknown - Present                       H/O testicular cancer  ICD-10-CM: Z85.47   ICD-9-CM: V10.47    Unknown - Present          Overview Signed 09/19/2011  8:54 AM by Antony Haste, MD            testicular cancer (Stage III)                                   Sleep disturbances  ICD-10-CM: G47.9   ICD-9-CM: 780.50    Unknown - Present                       RESOLVED: Hypoxemia  ICD-10-CM: R09.02   ICD-9-CM: 799.02    01/25/2019 - 01/27/2019                       RESOLVED: ESRD (end stage renal disease) on dialysis [facility])  ICD-10-CM: N18.6, Z99.2   ICD-9-CM: 585.6, V45.11    01/25/2019 - 01/27/2019                       RESOLVED: Pericardial effusion  ICD-10-CM: I31.3   ICD-9-CM: 423.9    01/25/2019 - 01/27/2019                       RESOLVED: Hospital acquired PNA  ICD-10-CM: J18.9, Y95   ICD-9-CM: 573    01/21/2019 - 01/29/2019                       RESOLVED: ARF (acute renal failure) ([place])  ICD-10-CM: N17.9   ICD-9-CM: 584.9    Unknown - 01/29/2019          Overview Signed 01/21/2019  1:36 PM by Orlinda Blalock, NP            on HD                                   RESOLVED: Pulmonary edema  ICD-10-CM: J81.1   ICD-9-CM: 514    01/21/2019 - 01/29/2019                       RESOLVED: Weakness of left upper extremity  ICD-10-CM: R29.898   ICD-9-CM: 729.89    06/26/2013 - 06/26/2013                       RESOLVED: Lumbar radiculopathy  ICD-10-CM: M54.16   ICD-9-CM: 724.4    08/10/2012 - 01/01/2016          Overview Signed 08/10/2012  7:48 AM by Antony Haste, MD            S/p steroid injection by Dr Laverta Waymart  08/01/12                                   RESOLVED: Essential tremor  ICD-10-CM: G25.0   ICD-9-CM: 333.1    Unknown - 01/27/2019  RESOLVED: GERD (gastroesophageal reflux disease)  ICD-10-CM: K21.9   ICD-9-CM: 530.81    Unknown - 01/27/2019          Overview Signed 09/26/2011  7:16 AM by Antony Haste, MD            Normal EGD (09/09/11)                                   RESOLVED: Chronic fatigue  ICD-10-CM: R53.82   ICD-9-CM: 780.79    08/15/1985 - 01/27/2019                          37 minutes were spent with the patient on counseling and coordination of care.      Signed:    Rocco Serene, MD   02/12/2019   9:34 AM

## 2019-02-12 NOTE — Discharge Summary (Signed)
Discharge Summary       PATIENT ID: John Friedman  MRN: [id number]   DATE OF BIRTH: 1966-03-16    DATE OF ADMISSION: 02/10/2019  9:24 AM    DATE OF DISCHARGE: 02/12/2019   PRIMARY CARE PROVIDER: Antony Haste, MD     ATTENDING PHYSICIAN: Rocco Serene, MD  DISCHARGING PROVIDER: Rocco Serene, MD    To contact this individual call [phone] and ask the operator to page.  If unavailable ask to be transferred the Adult Hospitalist Department.    CONSULTATIONS: IP CONSULT TO NEPHROLOGY  IP CONSULT TO PSYCHIATRY  IP CONSULT TO CARDIOLOGY    PROCEDURES/SURGERIES: * No surgery found *    [place] COURSE:   Re- admitted with chest pain, evaluated by cardiology, non cardiac sounding, sob resolved with dialysis, some anxiety psych eval. Needs f/u with PCP for anxiety/counselling.     Risk of Re-Admission: mod  DISCHARGE DIAGNOSES / PLAN:      Chest pain atypical- patient under observation telemetry.  Chest x-ray with mild bibasilar airspace disease -It very well could be atelectasis given absence of leukocytosis and fever  Cardiology following, no plans for repeat stress test. Reassured likely non cardiac.  COVID test negative  Last echo showed a small pericardial effusion and was treated with the volume removal with hemodialysis. Improved, will f/u with Cardiology - Dr Estill Bamberg in 2months.  ??  ??  ESRD: HD MWF ??Nephrology - HD while inpatient.  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 Getting epogen. F/u with nephrology op.  Hypothyroidism.TSH 6.5, synthroid??  Essential tremor, stable.  Chronic fatigue syndrome  Anxiety disorder: no depression, no suicidal ideas, no panic, psych eval. F/u op.     FOLLOW UP APPOINTMENTS:    Follow-up Information     Follow up With Specialties Details Why Contact Info    Antony Haste, MD Internal Medicine In 1 week  [address]  [place] [address]  [phone]        In 2 months  [facility]- cardiology          ADDITIONAL CARE RECOMMENDATIONS:  Follow up with PCP, cardiology and nephrology    DIET: Resume previous diet    ACTIVITY: Activity as tolerated    DISCHARGE MEDICATIONS:  Current Discharge Medication List      CONTINUE these medications which have NOT CHANGED    Details   ondansetron hcl (ZOFRAN) 4 mg tablet Take 1 Tab by mouth every eight (8) hours as needed for Nausea or Vomiting.  Qty: 10 Tab, Refills: 2    Associated Diagnoses: Acute renal failure, unspecified acute renal failure type (HCC)      clomiPRAMINE (ANAFRANIL) 50 mg capsule Take 150 mg by mouth nightly.      tamsulosin (Flomax) 0.4 mg capsule Take 0.4 mg by mouth daily.      propranolol (INDERAL) 20 mg tablet Take 20 mg by mouth daily. For tremors      clonazepam (KLONOPIN) 1 mg tablet Take 1 mg by mouth two (2) times a day.      pregabalin (LYRICA) 50 mg capsule Take 1 Cap by mouth nightly. Max Daily Amount: 50 mg. For "sleep disturbances" - pt has had injuries from falling out of bed during sleep  Qty: 30 Cap, Refills: 0    Associated Diagnoses: Sleep disturbances      liothyronine (CYTOMEL) 5 mcg tablet Take 5 mcg by mouth daily.  NOTIFY YOUR PHYSICIAN FOR ANY OF THE FOLLOWING:   Fever over 101 degrees for 24 hours.   Chest pain, shortness of breath, fever, chills, nausea, vomiting, diarrhea, change in mentation, falling, weakness, bleeding. Severe pain or pain not relieved by medications.  Or, any other signs or symptoms that you may have questions about.    DISPOSITION:    Home With:   OT  PT  HH  RN       Long term SNF/Inpatient Rehab   x Independent/assisted living    Hospice    Other:     PATIENT CONDITION AT DISCHARGE:     Functional status    Poor     Deconditioned     Independent      Cognition     Lucid     Forgetful     Dementia      Catheters/lines (plus indication)    Foley     PICC     PEG     None      Code status     Full code     DNR      PHYSICAL EXAMINATION AT DISCHARGE:   Patient seen and examined at bedside, Condition stable, explained discharge and follow up plans.  BP 144/86 (BP 1 Location: Right arm, BP Patient Position: At rest)    Pulse 92    Temp 98.1 ??F (36.7 ??C)    Resp 18    Ht 5\' 11"  (1.803 m)    Wt 76 kg (167 lb 8.8 oz)    SpO2 92%    BMI 23.37 kg/m??   General:  Alert, oriented, No acute distress. Axious, pale looking.  Resp:  No accessory muscle use, Good AE.  Neuro:  Grossly normal, no focal neuro deficits, follows commands     CHRONIC MEDICAL DIAGNOSES:  Problem List as of 02/12/2019 Date Reviewed: 02/16/19          Codes Class Noted - Resolved    SOB (shortness of breath) ICD-10-CM: R06.02  ICD-9-CM: 786.05  02/10/2019 - Present        Hyponatremia ICD-10-CM: E87.1  ICD-9-CM: 276.1  01/21/2019 - Present        Thrombocytopenia ([place]) ICD-10-CM: D69.6  ICD-9-CM: 287.5  01/21/2019 - Present        Anemia ICD-10-CM: D64.9  ICD-9-CM: 285.9  01/21/2019 - Present        Pleural effusion, bilateral ICD-10-CM: J90  ICD-9-CM: 511.9  01/21/2019 - Present        History of diastolic dysfunction ZOX-09-UE: Z86.79  ICD-9-CM: V12.59  01/21/2019 - Present    Overview Signed 01/21/2019  1:54 PM by Orlinda Blalock, NP     Chronic             Acute renal failure [facility]) ICD-10-CM: N17.9  ICD-9-CM: 584.9  01/20/2019 - Present    Overview Signed 01/20/2019  8:16 PM by Antony Haste, MD     May '20 - due to urinary retention, seen at [facility].  HD on M/W/F.             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  ICD-9-CM: 564.1  05/12/2015 - Present        Subclinical hypothyroidism ICD-10-CM: E03.9  ICD-9-CM: 244.8  Unknown - Present        H/O testicular cancer ICD-10-CM: Z85.47  ICD-9-CM: V10.47  Unknown - Present    Overview Signed 09/19/2011  8:54 AM by Antony Haste, MD  testicular cancer (Stage III)             Sleep disturbances ICD-10-CM: G47.9  ICD-9-CM: 780.50  Unknown - Present        RESOLVED: Hypoxemia ICD-10-CM: R09.02  ICD-9-CM: 799.02  01/25/2019 - 01/27/2019         RESOLVED: ESRD (end stage renal disease) on dialysis [facility]) ICD-10-CM: N18.6, Z99.2  ICD-9-CM: 585.6, V45.11  01/25/2019 - 01/27/2019        RESOLVED: Pericardial effusion ICD-10-CM: I31.3  ICD-9-CM: 423.9  01/25/2019 - 01/27/2019        RESOLVED: Hospital acquired PNA ICD-10-CM: J18.9, Y95  ICD-9-CM: 093  01/21/2019 - 01/29/2019        RESOLVED: ARF (acute renal failure) ([place]) ICD-10-CM: N17.9  ICD-9-CM: 584.9  Unknown - 01/29/2019    Overview Signed 01/21/2019  1:36 PM by Orlinda Blalock, NP     on HD             RESOLVED: Pulmonary edema ICD-10-CM: J81.1  ICD-9-CM: 514  01/21/2019 - 01/29/2019        RESOLVED: Weakness of left upper extremity ICD-10-CM: R29.898  ICD-9-CM: 729.89  06/26/2013 - 06/26/2013        RESOLVED: Lumbar radiculopathy ICD-10-CM: M54.16  ICD-9-CM: 724.4  08/10/2012 - 01/01/2016    Overview Signed 08/10/2012  7:48 AM by Antony Haste, MD     S/p steroid injection by Dr Laverta [place] 08/01/12             RESOLVED: Essential tremor ICD-10-CM: G25.0  ICD-9-CM: 333.1  Unknown - 01/27/2019        RESOLVED: GERD (gastroesophageal reflux disease) ICD-10-CM: K21.9  ICD-9-CM: 530.81  Unknown - 01/27/2019    Overview Signed 09/26/2011  7:16 AM by Antony Haste, MD     Normal EGD (09/09/11)             RESOLVED: Chronic fatigue ICD-10-CM: R53.82  ICD-9-CM: 780.79  08/15/1985 - 01/27/2019              37 minutes were spent with the patient on counseling and coordination of care.    Signed:   Rocco Serene, MD  02/12/2019  9:34 AM

## 2019-02-12 NOTE — Progress Notes (Addendum)
0730: Bedside shift change report given to [employer] developer) by Weber Cooks (offgoing nurse). Report included the following information SBAR, Kardex, MAR, Recent Results and Cardiac Rhythm NSR.     1215: I have reviewed discharge instructions with the patient.  The patient verbalized understanding.      Problem: Falls - Risk of  Goal: *Absence of Falls  Description: Document Patrcia Dolly Fall Risk and appropriate interventions in the flowsheet.  Outcome: Resolved/Met  Note: Fall Risk Interventions:  Mobility Interventions: Assess mobility with egress test, Communicate number of staff needed for ambulation/transfer, Patient to call before getting OOB, Strengthening exercises (ROM-active/passive), Utilize walker, cane, or other assistive device, Utilize gait belt for transfers/ambulation         Medication Interventions: Teach patient to arise slowly    Elimination Interventions: Call light in reach              Problem: Patient Education: Go to Patient Education Activity  Goal: Patient/Family Education  Outcome: Resolved/Met     Problem: Pressure Injury - Risk of  Goal: *Prevention of pressure injury  Description: Document Braden Scale and appropriate interventions in the flowsheet.  Outcome: Resolved/Met  Note: Pressure Injury Interventions:            Activity Interventions: Increase time out of bed, Pressure redistribution bed/mattress(bed type)    Mobility Interventions: Pressure redistribution bed/mattress (bed type)    Nutrition Interventions: Document food/fluid/supplement intake, Offer support with meals,snacks and hydration                     Problem: Patient Education: Go to Patient Education Activity  Goal: Patient/Family Education  Outcome: Resolved/Met     Problem: Activity Intolerance  Goal: *Oxygen saturation during activity within specified parameters  Outcome: Resolved/Met  Goal: *Able to remain out of bed as prescribed  Outcome: Resolved/Met      Problem: Patient Education: Go to Patient Education Activity  Goal: Patient/Family Education  Outcome: Resolved/Met     Problem: Anxiety  Goal: *Alleviation of anxiety  Outcome: Resolved/Met  Goal: *Alleviation of anxiety (Palliative Care)  Outcome: Resolved/Met     Problem: Patient Education: Go to Patient Education Activity  Goal: Patient/Family Education  Outcome: Resolved/Met     Problem: Discharge Planning  Goal: *Discharge to safe environment  Description: See CM Notes  CRM: Irving Shows, MPH, CHES; Z: [phone]     Outcome: Resolved/Met  Goal: *Knowledge of medication management  Outcome: Resolved/Met  Goal: *Knowledge of discharge instructions  Outcome: Resolved/Met     Problem: Chronic Renal Failure  Goal: *Fluid and electrolytes stabilized  Outcome: Resolved/Met     Problem: Patient Education: Go to Patient Education Activity  Goal: Patient/Family Education  Outcome: Resolved/Met

## 2019-02-12 NOTE — Other (Signed)
Letter of Status Determination: Current Status   OBSERVATION is Appropriate         Pt Name:  John Friedman   MR#  [id number]   CSN#   [id number]   Room and Hospital  460/01  @ [address] [facility]   Hospitalization date  02/10/2019  9:24 AM   Current Attending Physician  Rocco Serene, MD   Principal diagnosis  Chest pain    Clinicals  This is a 53 year old Caucasian male with a past medical history of end stage renal disease, on hemodialysis on Mondays, Wednesdays, and Fridays; history of testicular cancer in 1987; hypothyroidism, he comes over here because since his last 4 days, he has been having chest pain, w/o negative for ACS, stable vitals      [place] MCG criteria   Does  NOT apply ORG: M-89 (ISC)      STATUS DETERMINATION  On the basis of clinical data, available documentaion, we believe that the current status of this patient as OBSERVATION is Appropriate     The final decision of the patient's hospitalization status depends on the attending physician's judgment    Additional comments     Insurance  Payor: AETNA / Plan: [employer] AETNA PPO / Product Type: PPO /    Insurance Information                AETNA/BS[employer]SI [place] PPO Phone:     Subscriber: John Friedman, John Friedman Subscriber#: [id number]    Group#: [id number] Precert#:                    Toy Baker MD  Cell: [phone]  Physician Advisor

## 2019-02-12 NOTE — Progress Notes (Signed)
Problem: Falls - Risk of  Goal: *Absence of Falls  Description: Document Patrcia Dolly Fall Risk and appropriate interventions in the flowsheet.  Outcome: Progressing Towards Goal  Note: Fall Risk Interventions:  Mobility Interventions: Assess mobility with egress test, Communicate number of staff needed for ambulation/transfer, Patient to call before getting OOB, Strengthening exercises (ROM-active/passive), Utilize walker, cane, or other assistive device, Utilize gait belt for transfers/ambulation      Medication Interventions: Teach patient to arise slowly, Patient to call before getting OOB    Elimination Interventions: Call light in reach    Problem: Anxiety  Goal: *Alleviation of anxiety  Outcome: Progressing Towards Goal     Problem: Chronic Renal Failure  Goal: *Fluid and electrolytes stabilized  Outcome: Progressing Towards Goal     0730: Bedside and Verbal shift change report given to Imani,RN (Soil scientist) by Reginal Lutes (offgoing nurse). Report included the following information SBAR, Kardex, Intake/Output, MAR, Recent Results, Med Rec Status and Cardiac Rhythm NSR.

## 2019-02-12 NOTE — Consults (Signed)
[place]    Name:  John Friedman, John Friedman  MR#:  [id number]  DOB:  [birth year]  ACCOUNT #:  [id number]  DATE OF SERVICE:  02/12/2019    REASON FOR CONSULTATION:  Anxiety.    HISTORY OF PRESENT ILLNESS:  The patient is a 53 year old male who is currently being seen in the medical unit for psychiatric consultation for complaints mentioned above.  His admission to the medical unit is well documented on his chart.  His past medical history is significant for ESRD, on hemodialysis; history of testicular cancer; hypothyroidism.  He presented to the emergency room with 4 days of chest pain.  He states that he has a long history of anxiety and is currently seeing Dr. Delsa Bern on an outpatient basis.  He states that he started seeing him when he moved to [place] 7 years ago.  He tells me that he has been overwhelmed with psychosocial stressors, hemodialysis, family issues, moving into a new apartment.  He is visibly shaking during the interview and appears very anxious.  He spoke to Dr. Delsa Bern yesterday and he will have an appointment with him tomorrow.  He says that Dr. Delsa Bern would like for him to be started on Prozac 20 mg.  He states that he is currently on Klonopin 1 mg, Inderal 20 mg, and Anafranil 150 mg, and seems to be doing okay.  He denies suicidal ideation, homicidal ideation, auditory or visual hallucination.    PAST MEDICAL HISTORY:  See H and P.    PAST PSYCHIATRIC HISTORY:  He has never been admitted in any psychiatric floor.  He has a longstanding diagnosis of generalized anxiety disorder, and sees Dr. Delsa Bern as described above.    MEDICATIONS:  He is currently on Klonopin, Inderal, and Anafranil.    PSYCHOSOCIAL HISTORY:  He is single.  He has no children.  He is originally from [place] and moved to [place] 7 years ago.    MENTAL STATUS EXAMINATION:  He is alert and oriented in all spheres.  He is dressed in street clothes.  He is visibly shaking and anxious during  the interview.  He reports his mood is okay.  Speech, normal rate and rhythm.  Thought process, logical and goal-directed.  He denies suicidal ideation, homicidal ideation, auditory or visual hallucination.  Memory seems intact.  Intelligence seems average.  Insight is partial.  Judgment is fair.    ASSESSMENT AND PLANNING:  The patient carries a diagnosis of generalized anxiety disorder.  He has a followup appointment with Dr. Delsa Bern, his outpatient psychiatrist, tomorrow.  He had a phone consultation with Dr. Delsa Bern yesterday and he tells me that Dr. Delsa Bern recommends for him to be started on Prozac 20 mg.  I also recommend for him to increase his Inderal to 20 mg twice a day to help with shaking, and he agrees.  I have recommended for him to see a counselor or a therapist to help with anxiety.  There is no indication for inpatient psychiatric admission.  Please discharge to home once medically stable.    Thank you for this consult.  Please call with questions.      Amilcar Reever A Daniqua Campoy, NP      [id number]  D:  02/12/2019 16:11  T:  02/12/2019 17:29  JOB #:  [id number]

## 2019-02-13 NOTE — Progress Notes (Signed)
 Patient contacted regarding recent discharge and COVID-19 risk. Discussed COVID-19 related testing which was available at this time. Test results were negative. Patient informed of results, if available? yes    Care Transition Nurse/ Ambulatory Care Manager contacted the patient by telephone to perform post discharge assessment. Verified name and DOB with patient as identifiers.     Patient has following risk factors of: chronic kidney disease. CTN/ACM reviewed discharge instructions, medical action plan and red flags related to discharge diagnosis. Reviewed and educated them on any new and changed medications related to discharge diagnosis.  Advised obtaining a 90-day supply of all daily and as-needed medications.     Education provided regarding infection prevention, and signs and symptoms of COVID-19 and when to seek medical attention with patient who verbalized understanding. Discussed exposure protocols and quarantine from [facility] Guidelines "Are you at higher risk for severe illness 2019" and given an opportunity for questions and concerns. The patient agrees to contact the COVID-19 hotline [phone] or PCP office for questions related to their healthcare. CTN/ACM provided contact information for future reference.    From CDC: Are you at higher risk for severe illness?    SABRA Hauser your hands often.  SABRA Avoid close contact (6 feet, which is about two arm lengths) with people who are sick.  . Put distance between yourself and other people if COVID-19 is spreading in your community.  . Clean and disinfect frequently touched surfaces.  . Avoid all cruise travel and non-essential air travel.  . Call your healthcare professional if you have concerns about COVID-19 and your underlying condition or if you are sick.    For more information on steps you can take to protect yourself, see CDC's How to Protect Yourself      Patient/family/caregiver given information for GetWell Loop and agrees to enroll yes  Patient's  preferred e-mail:  [email or web address]  Patient's preferred phone number: [phone]  Based on Loop alert triggers, patient will be contacted by nurse care manager for worsening symptoms.    Pt will be further monitored by COVID Loop Team based on severity of symptoms and risk factors.    Patient has PCP f/u on 7/8

## 2019-02-13 NOTE — Progress Notes (Signed)
Patient contacted regarding recent discharge and COVID-19 risk. Discussed COVID-19 related testing which was available at this time. Test results were negative. Patient informed of results, if available? yes    Care Transition Nurse/ Ambulatory Care Manager contacted the patient by telephone to perform post discharge assessment. Verified name and DOB with patient as identifiers.     Patient has following risk factors of: chronic kidney disease. CTN/ACM reviewed discharge instructions, medical action plan and red flags related to discharge diagnosis. Reviewed and educated them on any new and changed medications related to discharge diagnosis.  Advised obtaining a 90-day supply of all daily and as-needed medications.     Education provided regarding infection prevention, and signs and symptoms of COVID-19 and when to seek medical attention with patient who verbalized understanding. Discussed exposure protocols and quarantine from [facility] Guidelines ???Are you at higher risk for severe illness 2019??? and given an opportunity for questions and concerns. The patient agrees to contact the COVID-19 hotline ([phone] or PCP office for questions related to their healthcare. CTN/ACM provided contact information for future reference.    From CDC: Are you at higher risk for severe illness?    ??? Wash your hands often.  ??? Avoid close contact (6 feet, which is about two arm lengths) with people who are sick.  ??? Put distance between yourself and other people if COVID-19 is spreading in your community.  ??? Clean and disinfect frequently touched surfaces.  ??? Avoid all cruise travel and non-essential air travel.  ??? Call your healthcare professional if you have concerns about COVID-19 and your underlying condition or if you are sick.    For more information on steps you can take to protect yourself, see CDC's How to Protect Yourself      Patient/family/caregiver given information for GetWell Loop and agrees to enroll yes   Patient's preferred e-mail:  [email or web address]  Patient's preferred phone number: [phone]  Based on Loop alert triggers, patient will be contacted by nurse care manager for worsening symptoms.    Pt will be further monitored by COVID Loop Team?? based on severity of symptoms and risk factors.    Patient has PCP f/u on 7/8

## 2019-02-17 ENCOUNTER — Inpatient Hospital Stay
Admit: 2019-02-17 | Discharge: 2019-02-17 | Disposition: A | Discharge status: Home / Self Care | Payer: PRIVATE HEALTH INSURANCE | Attending: Emergency Medicine

## 2019-02-17 DIAGNOSIS — Z466 Encounter for fitting and adjustment of urinary device: Secondary | ICD-10-CM

## 2019-02-17 MED ORDER — LIDOCAINE 2 % MUCOUS MEMBRANE JELLY IN APPLICATOR
2 % | Status: AC
Start: 2019-02-17 — End: 2019-02-17
  Administered 2019-02-17: 21:00:00 via URETHRAL

## 2019-02-17 MED FILL — GLYDO 2 % MUCOSAL JELLY IN APPLICATOR: 2 % | Qty: 11

## 2019-02-17 NOTE — ED Notes (Signed)
Triage Note: Patient arrives by EMS from his hotel for a foley problem. Patient reports he noticed his foley was pulled out and leaking. Patient has a foley due to renal failure. Patient has no other complaints.

## 2019-02-17 NOTE — ED Provider Notes (Signed)
The history is provided by the patient.   Urinary Catheter Problem    This is a new problem. The current episode started 1 to 2 hours ago. The problem has not changed since onset.The patient is experiencing no pain. There has been no fever. Associated symptoms comments: Leaking at site of catheter or leg bag, patient is unsure. His past medical history is significant for urinary catheter problem. His past medical history does not include recurrent UTIs.        Past Medical History:   Diagnosis Date   ??? Anemia 01/21/2019   ??? ARF (acute renal failure) (HCC)     on HD   ??? BPH (benign prostatic hyperplasia)    ??? Cancer ([place])     left testicle- stage III, nonseminomatous germ cell tumor, s/p orchiectomy & BEP x 3 cycles   ??? Chronic fatigue January 1987   ??? Chronic kidney disease     renal failure, HD   ??? Essential tremor    ??? GERD (gastroesophageal reflux disease)    ??? 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 Hyponatremia 01/21/2019   ??? Hypothyroid    ??? 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 Lumbar radiculopathy 08/10/2012    S/p steroid injection by Dr Laverta [place] 08/01/12    ??? Normal cardiac stress test 09/05/11   ??? Pleural effusion, bilateral 01/21/2019   ??? Pulmonary edema 01/21/2019   ??? Sleep disturbances     acting out in his sleep   ??? Thrombocytopenia ([place]) 01/21/2019       Past Surgical History:   Procedure Laterality Date   ??? HX CHOLECYSTECTOMY  12/05/2011    abnormal HIDA scan (EF < 2%)   ??? HX COLONOSCOPY  07/12/10    hyperplastic polyp, repeat 10 yrs   ??? HX COLONOSCOPY  02/23/15    normal   ??? HX CYST REMOVAL Right 01/06/2016    3rd toe ganglion cyst removal - podiatry (Dr Raliegh Ip)   ??? HX GI  09/09/11    EGD- normal   ??? HX HEENT Left 06/2012    tonsilar bx: benign tissue (Dr Buddy Duty)   ??? HX ORCHIECTOMY Left 2002   ??? 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 HX SHOULDER ARTHROSCOPY Right 08/13/2018         Family History:   Problem Relation Age of Onset   ??? No Known Problems Mother    ???  Parkinsonism Father    ??? Arthritis-osteo Father         scoliosis   ??? Dementia Father    ??? No Known Problems Brother    ??? No Known Problems Brother    ??? No Known Problems Sister    ??? Cancer Maternal Grandfather         prostate   ??? Cancer Paternal Grandfather         prostate       Social History     Socioeconomic History   ??? Marital status: SINGLE     Spouse name: Not on file   ??? Number of children: Not on file   ??? Years of education: Not on file   ??? Highest education level: Not on file   Occupational History   ??? Not on file   Social Needs   ??? Financial resource strain: Not on file   ??? Food insecurity     Worry: Not  on file     Inability: Not on file   ??? Transportation needs     Medical: Not on file     Non-medical: Not on file   Tobacco Use   ??? Smoking status: Never Smoker   ??? Smokeless tobacco: Never Used   Substance and Sexual Activity   ??? Alcohol use: Yes     Frequency: Never     Binge frequency: Never     Comment: rarely   ??? Drug use: No   ??? Sexual activity: Not Currently     Partners: Female   Lifestyle   ??? Physical activity     Days per week: Not on file     Minutes per session: Not on file   ??? Stress: Not on file   Relationships   ??? Social Product manager on phone: Not on file     Gets together: Not on file     Attends religious service: Not on file     Active member of club or organization: Not on file     Attends meetings of clubs or organizations: Not on file     Relationship status: Not on file   ??? Intimate partner violence     Fear of current or ex partner: Not on file     Emotionally abused: Not on file     Physically abused: Not on file     Forced sexual activity: Not on file   Other Topics Concern   ??? Not on file   Social History Narrative   ??? Not on file         ALLERGIES: Latex    Review of Systems   All other systems reviewed and are negative.      Vitals:    02/17/19 1703   BP: 128/90   Pulse: 90   Resp: 18   Temp: 98.7 ??F (37.1 ??C)   SpO2: 97%   Weight: 77.9 kg (171 lb 11.8 oz)             Physical Exam  Vitals signs and nursing note reviewed. Exam conducted with a chaperone present.   Constitutional:       General: He is not in acute distress.     Appearance: He is well-developed.   HENT:      Head: Normocephalic and atraumatic.   Eyes:      Conjunctiva/sclera: Conjunctivae normal.   Neck:      Musculoskeletal: Neck supple.      Trachea: No tracheal deviation.   Cardiovascular:      Rate and Rhythm: Normal rate and regular rhythm.   Pulmonary:      Effort: Pulmonary effort is normal. No respiratory distress.   Abdominal:      General: There is no distension.   Genitourinary:     Penis: Normal.       Comments: Foley catheter appropriately placed and not leaking at meatus. Leg bag with small nick in proximal plastic.  Musculoskeletal: Normal range of motion.         General: No deformity.   Skin:     General: Skin is warm and dry.   Neurological:      Mental Status: He is alert.      Cranial Nerves: No cranial nerve deficit.   Psychiatric:         Behavior: Behavior normal.          MDM     53 y.o. male  presents with concern for leaking foley catheter. He says It leaked all over his pants earlier. It appears to be functioning appropriately but unclear if it an external source of leak or if it is obstructed so it was replaced here by nursing. He is anxious but has no other complaints. Plan to follow up with PCP as needed and return precautions discussed for worsening or new concerning symptoms.     Procedures

## 2019-02-17 NOTE — ED Notes (Signed)
Provided patient with address to hotel and cab numbers. Patient will be arranging his own transportation upon discharge.

## 2019-02-17 NOTE — ED Triage Notes (Signed)
Triage Note: Patient arrives by EMS from his hotel for a foley problem. Patient reports he noticed his foley was pulled out and leaking. Patient has a foley due to renal failure. Patient has no other complaints.

## 2019-02-17 NOTE — ED Provider Notes (Signed)
The history is provided by the patient.   Urinary Catheter Problem    This is a new problem. The current episode started 1 to 2 hours ago. The problem has not changed since onset.The patient is experiencing no pain. There has been no fever. Associated symptoms comments: Leaking at site of catheter or leg bag, patient is unsure. His past medical history is significant for urinary catheter problem. His past medical history does not include recurrent UTIs.        Past Medical History:   Diagnosis Date   ??? Anemia 01/21/2019   ??? ARF (acute renal failure) (HCC)     on HD   ??? BPH (benign prostatic hyperplasia)    ??? Cancer ([place])     left testicle- stage III, nonseminomatous germ cell tumor, s/p orchiectomy & BEP x 3 cycles   ??? Chronic fatigue January 1987   ??? Chronic kidney disease     renal failure, HD   ??? Essential tremor    ??? GERD (gastroesophageal reflux disease)    ??? 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 Hyponatremia 01/21/2019   ??? Hypothyroid    ??? 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 Lumbar radiculopathy 08/10/2012    S/p steroid injection by Dr Laverta [place] 08/01/12    ??? Normal cardiac stress test 09/05/11   ??? Pleural effusion, bilateral 01/21/2019   ??? Pulmonary edema 01/21/2019   ??? Sleep disturbances     acting out in his sleep   ??? Thrombocytopenia ([place]) 01/21/2019       Past Surgical History:   Procedure Laterality Date   ??? HX CHOLECYSTECTOMY  12/05/2011    abnormal HIDA scan (EF < 2%)   ??? HX COLONOSCOPY  07/12/10    hyperplastic polyp, repeat 10 yrs   ??? HX COLONOSCOPY  02/23/15    normal   ??? HX CYST REMOVAL Right 01/06/2016    3rd toe ganglion cyst removal - podiatry (Dr Raliegh Ip)   ??? HX GI  09/09/11    EGD- normal   ??? HX HEENT Left 06/2012    tonsilar bx: benign tissue (Dr Buddy Duty)   ??? HX ORCHIECTOMY Left 2002   ??? HX ORTHOPAEDIC      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46   ??? HX SHOULDER ARTHROSCOPY Right 08/13/2018         Family History:   Problem Relation Age of Onset   ??? No Known Problems Mother     ??? Parkinsonism Father    ??? Arthritis-osteo Father         scoliosis   ??? Dementia Father    ??? No Known Problems Brother    ??? No Known Problems Brother    ??? No Known Problems Sister    ??? Cancer Maternal Grandfather         prostate   ??? Cancer Paternal Grandfather         prostate       Social History     Socioeconomic History   ??? Marital status: SINGLE     Spouse name: Not on file   ??? Number of children: Not on file   ??? Years of education: Not on file   ??? Highest education level: Not on file   Occupational History   ??? Not on file   Social Needs   ??? Financial resource strain: Not on file   ??? Food insecurity     Worry: Not  on file     Inability: Not on file   ??? Transportation needs     Medical: Not on file     Non-medical: Not on file   Tobacco Use   ??? Smoking status: Never Smoker   ??? Smokeless tobacco: Never Used   Substance and Sexual Activity   ??? Alcohol use: Yes     Frequency: Never     Binge frequency: Never     Comment: rarely   ??? Drug use: No   ??? Sexual activity: Not Currently     Partners: Female   Lifestyle   ??? Physical activity     Days per week: Not on file     Minutes per session: Not on file   ??? Stress: Not on file   Relationships   ??? Social Product manager on phone: Not on file     Gets together: Not on file     Attends religious service: Not on file     Active member of club or organization: Not on file     Attends meetings of clubs or organizations: Not on file     Relationship status: Not on file   ??? Intimate partner violence     Fear of current or ex partner: Not on file     Emotionally abused: Not on file     Physically abused: Not on file     Forced sexual activity: Not on file   Other Topics Concern   ??? Not on file   Social History Narrative   ??? Not on file         ALLERGIES: Latex    Review of Systems   All other systems reviewed and are negative.      Vitals:    02/17/19 1703   BP: 128/90   Pulse: 90   Resp: 18   Temp: 98.7 ??F (37.1 ??C)   SpO2: 97%   Weight: 77.9 kg (171 lb 11.8 oz)             Physical Exam  Vitals signs and nursing note reviewed. Exam conducted with a chaperone present.   Constitutional:       General: He is not in acute distress.     Appearance: He is well-developed.   HENT:      Head: Normocephalic and atraumatic.   Eyes:      Conjunctiva/sclera: Conjunctivae normal.   Neck:      Musculoskeletal: Neck supple.      Trachea: No tracheal deviation.   Cardiovascular:      Rate and Rhythm: Normal rate and regular rhythm.   Pulmonary:      Effort: Pulmonary effort is normal. No respiratory distress.   Abdominal:      General: There is no distension.   Genitourinary:     Penis: Normal.       Comments: Foley catheter appropriately placed and not leaking at meatus. Leg bag with small nick in proximal plastic.  Musculoskeletal: Normal range of motion.         General: No deformity.   Skin:     General: Skin is warm and dry.   Neurological:      Mental Status: He is alert.      Cranial Nerves: No cranial nerve deficit.   Psychiatric:         Behavior: Behavior normal.          MDM     53 y.o. male  presents with concern for leaking foley catheter. He says It leaked all over his pants earlier. It appears to be functioning appropriately but unclear if it an external source of leak or if it is obstructed so it was replaced here by nursing. He is anxious but has no other complaints. Plan to follow up with PCP as needed and return precautions discussed for worsening or new concerning symptoms.     Procedures

## 2019-02-17 NOTE — ED Notes (Signed)
The patient left the Emergency Department ambulatory, alert and oriented and in no acute distress. The patient was encouraged to call or return to the ED for worsening issues or problems and was encouraged to schedule a follow up appointment for continuing care.   ??  The patient verbalized understanding of discharge instructions and prescriptions, all questions were answered. The patient has no further concerns at this time.

## 2019-02-17 NOTE — ED Notes (Signed)
 The patient left the Emergency Department ambulatory, alert and oriented and in no acute distress. The patient was encouraged to call or return to the ED for worsening issues or problems and was encouraged to schedule a follow up appointment for continuing care.      The patient verbalized understanding of discharge instructions and prescriptions, all questions were answered. The patient has no further concerns at this time.

## 2019-02-19 ENCOUNTER — Inpatient Hospital Stay
Admit: 2019-02-19 | Discharge: 2019-02-19 | Disposition: A | Discharge status: Home / Self Care | Payer: PRIVATE HEALTH INSURANCE | Attending: Emergency Medicine

## 2019-02-19 DIAGNOSIS — T83091A Other mechanical complication of indwelling urethral catheter, initial encounter: Secondary | ICD-10-CM

## 2019-02-19 NOTE — ED Provider Notes (Signed)
The history is provided by the patient. No language interpreter was used.   Urinary Catheter Problem    [name]. The current episode started 3 to 5 hours ago. The problem occurs intermittently. The problem has been resolved. The pain is at a severity of 0/10. The patient is experiencing no pain. Pertinent negatives include no chills, no sweats, no nausea, no vomiting, no discharge, no frequency, no hematuria, no hesitancy, no urgency, no flank pain, no vaginal discharge, no penile discharge, no abdominal pain and no back pain. He has tried nothing for the symptoms. The treatment provided significant relief. His past medical history is significant for urinary catheter problem.        Past Medical History:   Diagnosis Date   ??? Anemia 01/21/2019   ??? ARF (acute renal failure) (HCC)     on HD   ??? BPH (benign prostatic hyperplasia)    ??? Cancer ([place])     left testicle- stage III, nonseminomatous germ cell tumor, s/p orchiectomy & BEP x 3 cycles   ??? Chronic fatigue January 1987   ??? Chronic kidney disease     renal failure, HD   ??? Essential tremor    ??? GERD (gastroesophageal reflux disease)    ??? 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 Hyponatremia 01/21/2019   ??? Hypothyroid    ??? 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 Lumbar radiculopathy 08/10/2012    S/p steroid injection by Dr Laverta [place] 08/01/12    ??? Normal cardiac stress test 09/05/11   ??? Pleural effusion, bilateral 01/21/2019   ??? Pulmonary edema 01/21/2019   ??? Sleep disturbances     acting out in his sleep   ??? Thrombocytopenia ([place]) 01/21/2019       Past Surgical History:   Procedure Laterality Date   ??? HX CHOLECYSTECTOMY  12/05/2011    abnormal HIDA scan (EF < 2%)   ??? HX COLONOSCOPY  07/12/10    hyperplastic polyp, repeat 10 yrs   ??? HX COLONOSCOPY  02/23/15    normal   ??? HX CYST REMOVAL Right 01/06/2016    3rd toe ganglion cyst removal - podiatry (Dr Raliegh Ip)   ??? HX GI  09/09/11    EGD- normal   ??? HX HEENT Left 06/2012    tonsilar bx: benign  tissue (Dr Buddy Duty)   ??? HX ORCHIECTOMY Left 2002   ??? HX ORTHOPAEDIC      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51   ??? HX SHOULDER ARTHROSCOPY Right 08/13/2018         Family History:   Problem Relation Age of Onset   ??? No Known Problems Mother    ??? Parkinsonism Father    ??? Arthritis-osteo Father         scoliosis   ??? Dementia Father    ??? No Known Problems Brother    ??? No Known Problems Brother    ??? No Known Problems Sister    ??? Cancer Maternal Grandfather         prostate   ??? Cancer Paternal Grandfather         prostate       Social History     Socioeconomic History   ??? Marital status: SINGLE     Spouse name: Not on file   ??? Number of children: Not on file   ??? Years of education: Not on file   ??? Highest education level:  Not on file   Occupational History   ??? Not on file   Social Needs   ??? Financial resource strain: Not on file   ??? Food insecurity     Worry: Not on file     Inability: Not on file   ??? Transportation needs     Medical: Not on file     Non-medical: Not on file   Tobacco Use   ??? Smoking status: Never Smoker   ??? Smokeless tobacco: Never Used   Substance and Sexual Activity   ??? Alcohol use: Yes     Frequency: Never     Binge frequency: Never     Comment: rarely   ??? Drug use: No   ??? Sexual activity: Not Currently     Partners: Female   Lifestyle   ??? Physical activity     Days per week: Not on file     Minutes per session: Not on file   ??? Stress: Not on file   Relationships   ??? Social Product manager on phone: Not on file     Gets together: Not on file     Attends religious service: Not on file     Active member of club or organization: Not on file     Attends meetings of clubs or organizations: Not on file     Relationship status: Not on file   ??? Intimate partner violence     Fear of current or ex partner: Not on file     Emotionally abused: Not on file     Physically abused: Not on file     Forced sexual activity: Not on file   Other Topics Concern   ??? Not on file   Social History Narrative   ??? Not on file          ALLERGIES: Latex    Review of Systems   Constitutional: Negative for activity change, chills and fever.   HENT: Negative for nosebleeds, sore throat, trouble swallowing and voice change.    Eyes: Negative for visual disturbance.   Respiratory: Negative for shortness of breath.    Cardiovascular: Negative for chest pain and palpitations.   Gastrointestinal: Negative for abdominal pain, constipation, diarrhea, nausea and vomiting.   Genitourinary: Negative for difficulty urinating, dysuria, flank pain, frequency, hematuria, hesitancy, penile discharge, urgency and vaginal discharge.   Musculoskeletal: Negative for back pain, neck pain and neck stiffness.   Skin: Negative for color change.   Allergic/Immunologic: Negative for immunocompromised state.   Neurological: Negative for dizziness, seizures, syncope, weakness, light-headedness, numbness and headaches.   Psychiatric/Behavioral: Negative for behavioral problems, confusion, hallucinations, self-injury and suicidal ideas.       Vitals:    02/19/19 1815   BP: (!) 134/96   Resp: 16   Temp: 98.5 ??F (36.9 ??C)   SpO2: 98%            Physical Exam  Vitals signs and nursing note reviewed.   Constitutional:       General: He is not in acute distress.     Appearance: He is well-developed. He is not diaphoretic.   HENT:      Head: Atraumatic.   Neck:      Trachea: No tracheal deviation.   Cardiovascular:      Comments: Warm and well perfused  Pulmonary:      Effort: Pulmonary effort is normal. No respiratory distress.   Musculoskeletal: Normal range of motion.  Skin:     General: Skin is warm and dry.   Neurological:      Mental Status: He is alert.      Coordination: Coordination normal.   Psychiatric:         Behavior: Behavior normal.         Thought Content: Thought content normal.         Judgment: Judgment normal.          MDM     This is a 53 year old male with past medical history, review of systems, physical exam as above, presenting with complaints of  urinary catheter dysfunction.  Patient states earlier today the cath came loose on his leg bag, causing urine to spill.  Patient was very upset this is contaminated his workspace at home.  He is here requesting alternative method of urine collection.  I discussed with the patient will be happy to change his leg bag in hopes of finding a firmer method of securement, as well as the addition of tape.  Patient is satisfied with this, states he has follow-up with urology later this month.  Denies fevers, chills, nausea or vomiting.  Will discharge home, recommend primary care follow-up with urology follow-up as scheduled, return precautions given.    Procedures

## 2019-02-19 NOTE — ED Notes (Signed)
Leg bag changed per ER MD request.     Discharge instructions reviewed with pt and copy given by this RN. Pt educated on leg bag care and to follow up with PCP tomorrow. Pt verbalized understanding and is ambulatory from ED in no sign of distress or discomfort.

## 2019-02-19 NOTE — ED Notes (Signed)
Pt reports "the metal clip in his foley is messing up and it is causing his bag to empty out." Pt denies all other complaints at this time.

## 2019-02-19 NOTE — ED Triage Notes (Signed)
Pt reports "the metal clip in his foley is messing up and it is causing his bag to empty out." Pt denies all other complaints at this time.

## 2019-02-19 NOTE — ED Provider Notes (Signed)
The history is provided by the patient. No language interpreter was used.   Urinary Catheter Problem    [name]. The current episode started 3 to 5 hours ago. The problem occurs intermittently. The problem has been resolved. The pain is at a severity of 0/10. The patient is experiencing no pain. Pertinent negatives include no chills, no sweats, no nausea, no vomiting, no discharge, no frequency, no hematuria, no hesitancy, no urgency, no flank pain, no vaginal discharge, no penile discharge, no abdominal pain and no back pain. He has tried nothing for the symptoms. The treatment provided significant relief. His past medical history is significant for urinary catheter problem.        Past Medical History:   Diagnosis Date   ??? Anemia 01/21/2019   ??? ARF (acute renal failure) (HCC)     on HD   ??? BPH (benign prostatic hyperplasia)    ??? Cancer ([place])     left testicle- stage III, nonseminomatous germ cell tumor, s/p orchiectomy & BEP x 3 cycles   ??? Chronic fatigue January 1987   ??? Chronic kidney disease     renal failure, HD   ??? Essential tremor    ??? GERD (gastroesophageal reflux disease)    ??? 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 Hyponatremia 01/21/2019   ??? Hypothyroid    ??? 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 Lumbar radiculopathy 08/10/2012    S/p steroid injection by Dr Laverta Bozeman 08/01/12    ??? Normal cardiac stress test 09/05/11   ??? Pleural effusion, bilateral 01/21/2019   ??? Pulmonary edema 01/21/2019   ??? Sleep disturbances     acting out in his sleep   ??? Thrombocytopenia ([place]) 01/21/2019       Past Surgical History:   Procedure Laterality Date   ??? HX CHOLECYSTECTOMY  12/05/2011    abnormal HIDA scan (EF < 2%)   ??? HX COLONOSCOPY  07/12/10    hyperplastic polyp, repeat 10 yrs   ??? HX COLONOSCOPY  02/23/15    normal   ??? HX CYST REMOVAL Right 01/06/2016    3rd toe ganglion cyst removal - podiatry (Dr Raliegh Ip)   ??? HX GI  09/09/11    EGD- normal   ??? HX HEENT Left 06/2012     tonsilar bx: benign tissue (Dr Buddy Duty)   ??? HX ORCHIECTOMY Left 2002   ??? HX ORTHOPAEDIC      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77   ??? HX SHOULDER ARTHROSCOPY Right 08/13/2018         Family History:   Problem Relation Age of Onset   ??? No Known Problems Mother    ??? Parkinsonism Father    ??? Arthritis-osteo Father         scoliosis   ??? Dementia Father    ??? No Known Problems Brother    ??? No Known Problems Brother    ??? No Known Problems Sister    ??? Cancer Maternal Grandfather         prostate   ??? Cancer Paternal Grandfather         prostate       Social History     Socioeconomic History   ??? Marital status: SINGLE     Spouse name: Not on file   ??? Number of children: Not on file   ??? Years of education: Not on file   ??? Highest education level:  Not on file   Occupational History   ??? Not on file   Social Needs   ??? Financial resource strain: Not on file   ??? Food insecurity     Worry: Not on file     Inability: Not on file   ??? Transportation needs     Medical: Not on file     Non-medical: Not on file   Tobacco Use   ??? Smoking status: Never Smoker   ??? Smokeless tobacco: Never Used   Substance and Sexual Activity   ??? Alcohol use: Yes     Frequency: Never     Binge frequency: Never     Comment: rarely   ??? Drug use: No   ??? Sexual activity: Not Currently     Partners: Female   Lifestyle   ??? Physical activity     Days per week: Not on file     Minutes per session: Not on file   ??? Stress: Not on file   Relationships   ??? Social Product manager on phone: Not on file     Gets together: Not on file     Attends religious service: Not on file     Active member of club or organization: Not on file     Attends meetings of clubs or organizations: Not on file     Relationship status: Not on file   ??? Intimate partner violence     Fear of current or ex partner: Not on file     Emotionally abused: Not on file     Physically abused: Not on file     Forced sexual activity: Not on file   Other Topics Concern   ??? Not on file    Social History Narrative   ??? Not on file         ALLERGIES: Latex    Review of Systems   Constitutional: Negative for activity change, chills and fever.   HENT: Negative for nosebleeds, sore throat, trouble swallowing and voice change.    Eyes: Negative for visual disturbance.   Respiratory: Negative for shortness of breath.    Cardiovascular: Negative for chest pain and palpitations.   Gastrointestinal: Negative for abdominal pain, constipation, diarrhea, nausea and vomiting.   Genitourinary: Negative for difficulty urinating, dysuria, flank pain, frequency, hematuria, hesitancy, penile discharge, urgency and vaginal discharge.   Musculoskeletal: Negative for back pain, neck pain and neck stiffness.   Skin: Negative for color change.   Allergic/Immunologic: Negative for immunocompromised state.   Neurological: Negative for dizziness, seizures, syncope, weakness, light-headedness, numbness and headaches.   Psychiatric/Behavioral: Negative for behavioral problems, confusion, hallucinations, self-injury and suicidal ideas.       Vitals:    02/19/19 1815   BP: (!) 134/96   Resp: 16   Temp: 98.5 ??F (36.9 ??C)   SpO2: 98%            Physical Exam  Vitals signs and nursing note reviewed.   Constitutional:       General: He is not in acute distress.     Appearance: He is well-developed. He is not diaphoretic.   HENT:      Head: Atraumatic.   Neck:      Trachea: No tracheal deviation.   Cardiovascular:      Comments: Warm and well perfused  Pulmonary:      Effort: Pulmonary effort is normal. No respiratory distress.   Musculoskeletal: Normal range of motion.  Skin:     General: Skin is warm and dry.   Neurological:      Mental Status: He is alert.      Coordination: Coordination normal.   Psychiatric:         Behavior: Behavior normal.         Thought Content: Thought content normal.         Judgment: Judgment normal.          MDM     This is a 53 year old male with past medical history, review of systems,  physical exam as above, presenting with complaints of urinary catheter dysfunction.  Patient states earlier today the cath came loose on his leg bag, causing urine to spill.  Patient was very upset this is contaminated his workspace at home.  He is here requesting alternative method of urine collection.  I discussed with the patient will be happy to change his leg bag in hopes of finding a firmer method of securement, as well as the addition of tape.  Patient is satisfied with this, states he has follow-up with urology later this month.  Denies fevers, chills, nausea or vomiting.  Will discharge home, recommend primary care follow-up with urology follow-up as scheduled, return precautions given.    Procedures

## 2019-02-20 ENCOUNTER — Ambulatory Visit
Admit: 2019-02-20 | Discharge: 2019-02-20 | Payer: PRIVATE HEALTH INSURANCE | Attending: Internal Medicine | Primary: Internal Medicine

## 2019-02-20 NOTE — Progress Notes (Signed)
error 

## 2019-02-22 ENCOUNTER — Ambulatory Visit: Attending: Internal Medicine | Primary: Internal Medicine

## 2019-02-22 ENCOUNTER — Ambulatory Visit: Admit: 2019-02-22 | Payer: PRIVATE HEALTH INSURANCE | Attending: Internal Medicine | Primary: Internal Medicine

## 2019-02-22 DIAGNOSIS — N179 Acute kidney failure, unspecified: Secondary | ICD-10-CM

## 2019-02-22 MED ORDER — LEVOTHYROXINE 25 MCG TAB
25 mcg | ORAL_TABLET | Freq: Every day | ORAL | 1 refills | Status: DC
Start: 2019-02-22 — End: 2019-04-17

## 2019-02-22 NOTE — Progress Notes (Signed)
John Friedman is a 53 y.o. male who was seen in clinic today (02/22/2019).          Assessment & Plan:   Diagnoses and all orders for this visit:    1. Acute renal failure, unspecified acute renal failure type ([place])- unchanged, he reports renal fxn stable, having issues w/ catheter, followed by urologist, will get 2nd opinion at [facility].  Reviewed when to consider seeing surgeon to create graph and when to consider seeing transplant surgeron.  -     REFERRAL TO NEPHROLOGY    2. Subclinical hypothyroidism- levels have been fluctuating, baseline has been TSH 6-8, reviewed likely elevation related to acute illness, does seem to have returned to baseline, he is worried & he reports psychiatrist is worried abnormal TSH could be compounding mood.  Not sure, but will check labs and start on low dose levothyroxine.  -     levothyroxine (SYNTHROID) 25 mcg tablet; Take 1 Tab by mouth Daily (before breakfast).  -     TSH 3RD GENERATION    3. Situational stress- poorly controlled, will need to defer to specialist, reviewed needing to keep specialist abreast of how he is doing and time needed to be given prior to seeing changes.  Has poor social support.  Reviewed pros/cons to going back to [place].  Agree with taking time off.    4. SOB (shortness of breath)- recurrent issue, has a h/o pleural effusions, will repeat imaging.  -     XR CHEST PA LAT; Future      I spent 25 minutes with the patient and > 50% of the time was spent counseling him on issues above.      Follow-up and Dispositions    ?? Return in about 1 month (around 03/25/2019) for Regular follow up.       Subjective:   John Friedman was seen today for Anxiety    Last seen on 02/06/19.  Since then admitted to [facility] (6/28-6/30) for SOB.  He has been in the ER x 2 (7/5 and 7/7) due to trouble with urinary catheter.   He did have appointment with nephrology on 6/26, notes received & reviewed.    He wants to talk to me today to talk about BP, thyroid, and getting a referral to [facility] for  2nd opinion.    He reports BP is well controlled at home but on HD it is 150's/90's.  He has concerns about this.  Checking at home and is "normal".  He did meet with nephrologist since last visit and reports still has a lot of questions.      He reports stress is still elevated (psychiatry- Dr Delsa Bern).  He has been following up with specialist and keeping him informed of how he is doing.  Medication adjusted recently.  He has not social support in town.  He is concerned about going back to [place] as his mother is 56+ yrs old.      He has been prescribed Cytomel but has not been taking it.  He reports prescribed by physician in [place].  Just started this week, has taken two doses.            04/09/2018   TSH 0.36 - 3.74 uIU/mL 6.790 (A)       02/05/2019   TSH 0.36 - 3.74 uIU/mL 22.60 (A)      02/11/2019   TSH 0.36 - 3.74 uIU/mL 6.46 (A)          Brief Labs:  Lab Results   Component Value Date/Time    Sodium 130 02/11/2019 02:41 AM    Potassium 3.0 02/11/2019 02:41 AM    Creatinine 7.80 02/11/2019 02:41 AM    Creatinine, External 0.87 11/14/2017    Cholesterol, total 184 04/09/2018 07:25 AM    HDL Cholesterol 52 04/09/2018 07:25 AM    LDL, calculated 120 04/09/2018 07:25 AM    Triglyceride 59 04/09/2018 07:25 AM    TSH 6.46 02/11/2019 12:26 PM          Prior to Admission medications    Medication Sig Start Date End Date Taking? Authorizing Provider   propranoloL (INDERAL) 20 mg tablet Take 1 Tab by mouth two (2) times a day. Indications: gad 02/12/19  Yes Epstein, Steffanie A, NP   FLUoxetine (PROzac) 20 mg capsule Take 1 Cap by mouth daily. Indications: repeated episodes of anxiety 02/12/19  Yes Epstein, Steffanie A, NP   ondansetron hcl (ZOFRAN) 4 mg tablet Take 1 Tab by mouth every eight (8) hours as needed for Nausea or Vomiting. 02/06/19  Yes Antony Haste, MD   pregabalin (LYRICA) 50 mg capsule Take 1 Cap by mouth nightly. Max Daily Amount: 50 mg. For "sleep disturbances" - pt has had injuries from falling out of  bed during sleep 01/29/19  Yes Antony Haste, MD   clomiPRAMINE (ANAFRANIL) 50 mg capsule Take 150 mg by mouth nightly.   Yes Provider, Historical   liothyronine (CYTOMEL) 5 mcg tablet Take 5 mcg by mouth daily.   Yes Provider, Historical   clonazepam (KLONOPIN) 1 mg tablet Take 1 mg by mouth two (2) times a day.   Yes Provider, Historical   tamsulosin (Flomax) 0.4 mg capsule Take 0.4 mg by mouth daily.    Provider, Historical          Allergies   Allergen Reactions   ??? Latex Other (comments)     redness           Review of Systems   Constitutional: Positive for malaise/fatigue. Negative for chills, fever and weight loss.   Respiratory: Positive for shortness of breath. Negative for cough.    Cardiovascular: Negative for chest pain, palpitations and leg swelling.   Gastrointestinal: Positive for nausea (on/off, no obvious triggers). Negative for abdominal pain, constipation, diarrhea, heartburn and vomiting.   Musculoskeletal: Negative for joint pain and myalgias.   Neurological: Positive for tremors. Negative for dizziness and headaches.   Psychiatric/Behavioral: Negative for depression. The patient is nervous/anxious and has insomnia.         Objective:   Physical Exam  Constitutional:       General: He is not in acute distress.     Appearance: Normal appearance.   Eyes:      Conjunctiva/sclera: Conjunctivae normal.   Cardiovascular:      Rate and Rhythm: Regular rhythm.      Heart sounds: No murmur.   Pulmonary:      Effort: Pulmonary effort is normal.      Breath sounds: No decreased breath sounds or wheezing.      Comments: Decreased BS in R lower base  Abdominal:      General: Bowel sounds are normal.      Palpations: Abdomen is soft.      Tenderness: There is no abdominal tenderness.   Musculoskeletal:      Right lower leg: No edema.      Left lower leg: No edema.   Neurological:      Comments:  Tremor, with motion   Psychiatric:         Mood and Affect: Affect normal. Mood is anxious.           Visit  Vitals  BP 124/80   Pulse 86   Temp 98.2 ??F (36.8 ??C) (Temporal)   Resp 20   Ht 5\' 11"  (1.803 m)   Wt 169 lb (76.7 kg)   SpO2 95%   BMI 23.57 kg/m??         Disclaimer:  Advised him to call back or return to office if symptoms worsen/change/persist.  Discussed expected course/resolution/complications of diagnosis in detail with patient.    Medication risks/benefits/costs/interactions/alternatives discussed with patient.  He was given an after visit summary which includes diagnoses, current medications, & vitals.  He expressed understanding with the diagnosis and plan.      Aspects of this note may have been generated using voice recognition software. Despite editing, there may be some syntax errors.       Antony Haste, MD

## 2019-02-22 NOTE — Progress Notes (Signed)
Anxiety, disability.  Asking for referral to [facility]phrology.

## 2019-02-22 NOTE — Progress Notes (Signed)
Results released to patient via MyChart.  TSH is high.  Will start on levothyroxine.

## 2019-02-22 NOTE — Progress Notes (Signed)
Pt. States his SOB is because he needs dialysis 4p today.

## 2019-02-22 NOTE — Progress Notes (Signed)
Anxiety, disability.  Asking for referral to [facility] Nephrology.

## 2019-02-22 NOTE — Progress Notes (Signed)
John Friedman is a 53 y.o. male who was seen in clinic today (02/22/2019).          Assessment & Plan:   Diagnoses and all orders for this visit:    1. Acute renal failure, unspecified acute renal failure type ([place])- unchanged, he reports renal fxn stable, having issues w/ catheter, followed by urologist, will get 2nd opinion at [facility].  Reviewed when to consider seeing surgeon to create graph and when to consider seeing transplant surgeron.  -     REFERRAL TO NEPHROLOGY    2. Subclinical hypothyroidism- levels have been fluctuating, baseline has been TSH 6-8, reviewed likely elevation related to acute illness, does seem to have returned to baseline, he is worried & he reports psychiatrist is worried abnormal TSH could be compounding mood.  Not sure, but will check labs and start on low dose levothyroxine.  -     levothyroxine (SYNTHROID) 25 mcg tablet; Take 1 Tab by mouth Daily (before breakfast).  -     TSH 3RD GENERATION    3. Situational stress- poorly controlled, will need to defer to specialist, reviewed needing to keep specialist abreast of how he is doing and time needed to be given prior to seeing changes.  Has poor social support.  Reviewed pros/cons to going back to [place].  Agree with taking time off.    4. SOB (shortness of breath)- recurrent issue, has a h/o pleural effusions, will repeat imaging.  -     XR CHEST PA LAT; Future      I spent 25 minutes with the patient and > 50% of the time was spent counseling him on issues above.      Follow-up and Dispositions    ?? Return in about 1 month (around 03/25/2019) for Regular follow up.       Subjective:   [name] was seen today for Anxiety    Last seen on 02/06/19.  Since then admitted to [facility] (6/28-6/30) for SOB.  He has been in the ER x 2 (7/5 and 7/7) due to trouble with urinary catheter.   He did have appointment with nephrology on 6/26, notes received & reviewed.    He wants to talk to me today to talk about BP, thyroid, and getting a  referral to [facility] for 2nd opinion.    He reports BP is well controlled at home but on HD it is 150's/90's.  He has concerns about this.  Checking at home and is "normal".  He did meet with nephrologist since last visit and reports still has a lot of questions.      He reports stress is still elevated (psychiatry- Dr Delsa Bern).  He has been following up with specialist and keeping him informed of how he is doing.  Medication adjusted recently.  He has not social support in town.  He is concerned about going back to [place] as his mother is 25+ yrs old.      He has been prescribed Cytomel but has not been taking it.  He reports prescribed by physician in [place].  Just started this week, has taken two doses.            04/09/2018   TSH 0.36 - 3.74 uIU/mL 6.790 (A)       02/05/2019   TSH 0.36 - 3.74 uIU/mL 22.60 (A)      02/11/2019   TSH 0.36 - 3.74 uIU/mL 6.46 (A)          Brief Labs:  Lab Results   Component Value Date/Time    Sodium 130 02/11/2019 02:41 AM    Potassium 3.0 02/11/2019 02:41 AM    Creatinine 7.80 02/11/2019 02:41 AM    Creatinine, External 0.87 11/14/2017    Cholesterol, total 184 04/09/2018 07:25 AM    HDL Cholesterol 52 04/09/2018 07:25 AM    LDL, calculated 120 04/09/2018 07:25 AM    Triglyceride 59 04/09/2018 07:25 AM    TSH 6.46 02/11/2019 12:26 PM          Prior to Admission medications    Medication Sig Start Date End Date Taking? Authorizing Provider   propranoloL (INDERAL) 20 mg tablet Take 1 Tab by mouth two (2) times a day. Indications: gad 02/12/19  Yes Epstein, Steffanie A, NP   FLUoxetine (PROzac) 20 mg capsule Take 1 Cap by mouth daily. Indications: repeated episodes of anxiety 02/12/19  Yes Epstein, Steffanie A, NP   ondansetron hcl (ZOFRAN) 4 mg tablet Take 1 Tab by mouth every eight (8) hours as needed for Nausea or Vomiting. 02/06/19  Yes Antony Haste, MD   pregabalin (LYRICA) 50 mg capsule Take 1 Cap by mouth nightly. Max Daily  Amount: 50 mg. For "sleep disturbances" - pt has had injuries from falling out of bed during sleep 01/29/19  Yes Antony Haste, MD   clomiPRAMINE (ANAFRANIL) 50 mg capsule Take 150 mg by mouth nightly.   Yes Provider, Historical   liothyronine (CYTOMEL) 5 mcg tablet Take 5 mcg by mouth daily.   Yes Provider, Historical   clonazepam (KLONOPIN) 1 mg tablet Take 1 mg by mouth two (2) times a day.   Yes Provider, Historical   tamsulosin (Flomax) 0.4 mg capsule Take 0.4 mg by mouth daily.    Provider, Historical          Allergies   Allergen Reactions   ??? Latex Other (comments)     redness           Review of Systems   Constitutional: Positive for malaise/fatigue. Negative for chills, fever and weight loss.   Respiratory: Positive for shortness of breath. Negative for cough.    Cardiovascular: Negative for chest pain, palpitations and leg swelling.   Gastrointestinal: Positive for nausea (on/off, no obvious triggers). Negative for abdominal pain, constipation, diarrhea, heartburn and vomiting.   Musculoskeletal: Negative for joint pain and myalgias.   Neurological: Positive for tremors. Negative for dizziness and headaches.   Psychiatric/Behavioral: Negative for depression. The patient is nervous/anxious and has insomnia.         Objective:   Physical Exam  Constitutional:       General: He is not in acute distress.     Appearance: Normal appearance.   Eyes:      Conjunctiva/sclera: Conjunctivae normal.   Cardiovascular:      Rate and Rhythm: Regular rhythm.      Heart sounds: No murmur.   Pulmonary:      Effort: Pulmonary effort is normal.      Breath sounds: No decreased breath sounds or wheezing.      Comments: Decreased BS in R lower base  Abdominal:      General: Bowel sounds are normal.      Palpations: Abdomen is soft.      Tenderness: There is no abdominal tenderness.   Musculoskeletal:      Right lower leg: No edema.      Left lower leg: No edema.   Neurological:      Comments:  Tremor, with motion    Psychiatric:         Mood and Affect: Affect normal. Mood is anxious.           Visit Vitals  BP 124/80   Pulse 86   Temp 98.2 ??F (36.8 ??C) (Temporal)   Resp 20   Ht 5\' 11"  (1.803 m)   Wt 169 lb (76.7 kg)   SpO2 95%   BMI 23.57 kg/m??         Disclaimer:  Advised him to call back or return to office if symptoms worsen/change/persist.  Discussed expected course/resolution/complications of diagnosis in detail with patient.    Medication risks/benefits/costs/interactions/alternatives discussed with patient.  He was given an after visit summary which includes diagnoses, current medications, & vitals.  He expressed understanding with the diagnosis and plan.      Aspects of this note may have been generated using voice recognition software. Despite editing, there may be some syntax errors.       Antony Haste, MD

## 2019-02-23 LAB — TSH 3RD GENERATION
TSH: 16.49 u[IU]/mL — ABNORMAL HIGH (ref 0.450–4.500)
TSH: 16.49 u[IU]/mL — ABNORMAL HIGH (ref 0.450–4.500)

## 2019-02-27 ENCOUNTER — Inpatient Hospital Stay: Admit: 2019-02-27 | Payer: PRIVATE HEALTH INSURANCE | Primary: Internal Medicine

## 2019-02-27 DIAGNOSIS — R0602 Shortness of breath: Secondary | ICD-10-CM

## 2019-02-27 NOTE — Progress Notes (Signed)
Results reviewed.  Ordered on 7/10, completed on 7/15.  Results released via MyChart.

## 2019-02-28 NOTE — Telephone Encounter (Signed)
Left message for patient to return call.  Referral, last OV note, last labs faxed to number provided.

## 2019-02-28 NOTE — Telephone Encounter (Addendum)
Pt will be seeing dr Amedeo Plenty  On Monday  And he  Had asked dr Amedeo Plenty for a referral to [facility]   Dept.  This has to be done asap can you call pt [phone] pt said he left the fax  [phone]

## 2019-02-28 NOTE — Telephone Encounter (Signed)
Returning call

## 2019-03-01 ENCOUNTER — Encounter: Attending: Internal Medicine | Primary: Internal Medicine

## 2019-03-01 NOTE — Telephone Encounter (Signed)
Verified patient identity with two identifiers. Spoke with patient by phone and informed records and referral have been faxed to [facility]. Patient verbalized understanding.

## 2019-03-04 ENCOUNTER — Ambulatory Visit: Attending: Internal Medicine | Primary: Internal Medicine

## 2019-03-04 ENCOUNTER — Ambulatory Visit
Admit: 2019-03-04 | Discharge: 2019-03-04 | Payer: PRIVATE HEALTH INSURANCE | Attending: Internal Medicine | Primary: Internal Medicine

## 2019-03-04 DIAGNOSIS — N319 Neuromuscular dysfunction of bladder, unspecified: Secondary | ICD-10-CM

## 2019-03-04 MED ORDER — ONDANSETRON HCL 4 MG TAB
4 mg | ORAL_TABLET | Freq: Three times a day (TID) | ORAL | 2 refills | Status: DC | PRN
Start: 2019-03-04 — End: 2020-05-21

## 2019-03-04 NOTE — Progress Notes (Signed)
John Friedman is a 53 y.o. male who was seen in clinic today (03/04/2019).          Assessment & Plan:   Diagnoses and all orders for this visit:    1. Bladder dysfunction- new dx, he reports seen by urology and had urodynamics, records will be requested, reviewed he needs to discuss tremor and self cath issues w/ specialist.  Reviewed he needs to empty his bladder.    2. Situational stress- stable, will plan on staying in [place], continue current meds.      I spent 25 minutes with the patient and > 50% of the time was spent counseling on treatment options and expectations for issues above.  He asked for my opinion and it was provided.  No med changes, zofran refilled to use prn.       Follow-up and Dispositions    ?? Return in about 1 month (around 04/04/2019) for Regular follow up.       Subjective:   My was seen today for Anxiety    Since last visit he did see Urology (Dr Garnette Czech).  He had catheter removed.  He is doing intermittent self catheterization.  He is having some limitations based on his tremor, he is trying to do at least once/day.  He is still making urine.    He is still not working, still on disability.  He is thinking about retiring to [facility] next year.  He has concerns about COVID-19 due to her age (she is 50).  He is hopeful for a vaccine.  He wants to do everything safe         Brief Labs:     Lab Results   Component Value Date/Time    Sodium 130 02/11/2019 02:41 AM    Potassium 3.0 02/11/2019 02:41 AM    Creatinine 7.80 02/11/2019 02:41 AM    Creatinine, External 0.87 11/14/2017    Cholesterol, total 184 04/09/2018 07:25 AM    HDL Cholesterol 52 04/09/2018 07:25 AM    LDL, calculated 120 04/09/2018 07:25 AM    Triglyceride 59 04/09/2018 07:25 AM    TSH 16.490 02/22/2019 02:37 PM          Prior to Admission medications    Medication Sig Start Date End Date Taking? Authorizing Provider   levothyroxine (SYNTHROID) 25 mcg tablet Take 1 Tab by mouth Daily (before breakfast). 02/22/19  Yes Antony Haste, MD   propranoloL (INDERAL) 20 mg tablet Take 1 Tab by mouth two (2) times a day. Indications: gad 02/12/19  Yes Epstein, Steffanie A, NP   FLUoxetine (PROzac) 20 mg capsule Take 1 Cap by mouth daily. Indications: repeated episodes of anxiety 02/12/19  Yes Epstein, Steffanie A, NP   ondansetron hcl (ZOFRAN) 4 mg tablet Take 1 Tab by mouth every eight (8) hours as needed for Nausea or Vomiting. 02/06/19  Yes Antony Haste, MD   pregabalin (LYRICA) 50 mg capsule Take 1 Cap by mouth nightly. Max Daily Amount: 50 mg. For "sleep disturbances" - pt has had injuries from falling out of bed during sleep 01/29/19  Yes Antony Haste, MD   clomiPRAMINE (ANAFRANIL) 50 mg capsule Take 150 mg by mouth nightly.   Yes Provider, Historical   clonazepam (KLONOPIN) 1 mg tablet Take 1 mg by mouth two (2) times a day.   Yes Provider, Historical   tamsulosin (Flomax) 0.4 mg capsule Take 0.4 mg by mouth daily.    Provider, Historical  Allergies   Allergen Reactions   ??? Latex Other (comments)     redness           Review of Systems   Constitutional: Positive for malaise/fatigue. Negative for chills, fever and weight loss.   Respiratory: Negative for cough and shortness of breath.    Cardiovascular: Negative for chest pain and palpitations.   Gastrointestinal: Positive for diarrhea and nausea (associated w/ HD). Negative for abdominal pain, constipation and vomiting.        Objective:   Physical Exam - deferred       Visit Vitals  BP 120/82   Pulse 77   Temp 98 ??F (36.7 ??C) (Temporal)   Resp 16   Ht 5\' 11"  (1.803 m)   Wt 166 lb (75.3 kg)   SpO2 97%   BMI 23.15 kg/m??         Disclaimer:  Advised him to call back or return to office if symptoms worsen/change/persist.  Discussed expected course/resolution/complications of diagnosis in detail with patient.    Medication risks/benefits/costs/interactions/alternatives discussed with patient.  He was given an after visit summary which includes diagnoses, current  medications, & vitals.  He expressed understanding with the diagnosis and plan.      Aspects of this note may have been generated using voice recognition software. Despite editing, there may be some syntax errors.       Antony Haste, MD

## 2019-03-04 NOTE — Progress Notes (Signed)
Follow up, to discuss anxiety, paper work, how he is feeling in general.  Reports increased nausea. Trying intermittent catheterizations.

## 2019-03-04 NOTE — Progress Notes (Signed)
John Friedman is a 53 y.o. male who was seen in clinic today (03/04/2019).          Assessment & Plan:   Diagnoses and all orders for this visit:    1. Bladder dysfunction- new dx, he reports seen by urology and had urodynamics, records will be requested, reviewed he needs to discuss tremor and self cath issues w/ specialist.  Reviewed he needs to empty his bladder.    2. Situational stress- stable, will plan on staying in [place], continue current meds.      I spent 25 minutes with the patient and > 50% of the time was spent counseling on treatment options and expectations for issues above.  He asked for my opinion and it was provided.  No med changes, zofran refilled to use prn.       Follow-up and Dispositions    ?? Return in about 1 month (around 04/04/2019) for Regular follow up.       Subjective:   John Friedman was seen today for Anxiety    Since last visit he did see Urology (Dr Garnette Czech).  He had catheter removed.  He is doing intermittent self catheterization.  He is having some limitations based on his tremor, he is trying to do at least once/day.  He is still making urine.    He is still not working, still on disability.  He is thinking about retiring to [facility] next year.  He has concerns about COVID-19 due to her age (she is 53).  He is hopeful for a vaccine.  He wants to do everything safe         Brief Labs:     Lab Results   Component Value Date/Time    Sodium 130 02/11/2019 02:41 AM    Potassium 3.0 02/11/2019 02:41 AM    Creatinine 7.80 02/11/2019 02:41 AM    Creatinine, External 0.87 11/14/2017    Cholesterol, total 184 04/09/2018 07:25 AM    HDL Cholesterol 52 04/09/2018 07:25 AM    LDL, calculated 120 04/09/2018 07:25 AM    Triglyceride 59 04/09/2018 07:25 AM    TSH 16.490 02/22/2019 02:37 PM          Prior to Admission medications    Medication Sig Start Date End Date Taking? Authorizing Provider   levothyroxine (SYNTHROID) 25 mcg tablet Take 1 Tab by mouth Daily (before  breakfast). 02/22/19  Yes Antony Haste, MD   propranoloL (INDERAL) 20 mg tablet Take 1 Tab by mouth two (2) times a day. Indications: gad 02/12/19  Yes Epstein, Steffanie A, NP   FLUoxetine (PROzac) 20 mg capsule Take 1 Cap by mouth daily. Indications: repeated episodes of anxiety 02/12/19  Yes Epstein, Steffanie A, NP   ondansetron hcl (ZOFRAN) 4 mg tablet Take 1 Tab by mouth every eight (8) hours as needed for Nausea or Vomiting. 02/06/19  Yes Antony Haste, MD   pregabalin (LYRICA) 50 mg capsule Take 1 Cap by mouth nightly. Max Daily Amount: 50 mg. For "sleep disturbances" - pt has had injuries from falling out of bed during sleep 01/29/19  Yes Antony Haste, MD   clomiPRAMINE (ANAFRANIL) 50 mg capsule Take 150 mg by mouth nightly.   Yes Provider, Historical   clonazepam (KLONOPIN) 1 mg tablet Take 1 mg by mouth two (2) times a day.   Yes Provider, Historical   tamsulosin (Flomax) 0.4 mg capsule Take 0.4 mg by mouth daily.    Provider, Historical  Allergies   Allergen Reactions   ??? Latex Other (comments)     redness           Review of Systems   Constitutional: Positive for malaise/fatigue. Negative for chills, fever and weight loss.   Respiratory: Negative for cough and shortness of breath.    Cardiovascular: Negative for chest pain and palpitations.   Gastrointestinal: Positive for diarrhea and nausea (associated w/ HD). Negative for abdominal pain, constipation and vomiting.        Objective:   Physical Exam - deferred       Visit Vitals  BP 120/82   Pulse 77   Temp 98 ??F (36.7 ??C) (Temporal)   Resp 16   Ht 5\' 11"  (1.803 m)   Wt 166 lb (75.3 kg)   SpO2 97%   BMI 23.15 kg/m??         Disclaimer:  Advised him to call back or return to office if symptoms worsen/change/persist.  Discussed expected course/resolution/complications of diagnosis in detail with patient.    Medication risks/benefits/costs/interactions/alternatives discussed with patient.   He was given an after visit summary which includes diagnoses, current medications, & vitals.  He expressed understanding with the diagnosis and plan.      Aspects of this note may have been generated using voice recognition software. Despite editing, there may be some syntax errors.       Antony Haste, MD

## 2019-03-06 ENCOUNTER — Ambulatory Visit: Attending: Internal Medicine | Primary: Internal Medicine

## 2019-03-06 ENCOUNTER — Ambulatory Visit
Admit: 2019-03-06 | Discharge: 2019-03-06 | Payer: PRIVATE HEALTH INSURANCE | Attending: Internal Medicine | Primary: Internal Medicine

## 2019-03-06 ENCOUNTER — Encounter

## 2019-03-06 DIAGNOSIS — C6292 Malignant neoplasm of left testis, unspecified whether descended or undescended: Secondary | ICD-10-CM

## 2019-03-06 NOTE — Addendum Note (Signed)
Addendum Note by Philis Fendt A at 03/06/19 1300                Author: Philis Fendt A  Service: --  Author Type: --       Filed: 03/07/19 0946  Encounter Date: 03/06/2019  Status: Signed          Editor: Philis Fendt A          Addended by: Philis Fendt A on: 03/07/2019 09:46 AM    Modules accepted: Level of Service

## 2019-03-06 NOTE — Progress Notes (Signed)
Progress  Notes by Juliene Pina, MD at 03/06/19 1300                Author: Juliene Pina, MD  Service: --  Author Type: Physician       Filed: 03/06/19 1411  Encounter Date: 03/06/2019  Status: Signed          Editor: Juliene Pina, MD (Physician)                       [place] [facility]   [address], [place] [address]   W: [phone]   F: [phone]        Reason for Visit:     John Friedman is a 53 y.o.  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      Negative tumor markers and CT scans in 2017. He has relocated to [place].    He has had  Leucopenia since 2007, worsening anemia and fluctuating thrombocytopenia. 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 He thinks its from chronic obstruction. He states that he had A BM biopsy at [facility] in June 2020 when he was admitted with SOB secondary to fluid  overload and pleural effusions   He is on Epogen per nephrology      His SOB is at baseline, has chronic diarrhea, has no fevers, bleeding, has anxiety, has no fevers, chills, sweats, he has no rashes, has stable weight and appetite. Lives alone. Mother is 67 and lives in [place]           Past Medical History:        Diagnosis  Date         ?  Anemia  01/21/2019     ?  ARF (acute renal failure) (HCC)            on HD         ?  BPH (benign prostatic hyperplasia)       ?  Cancer ([place])            left testicle- stage III, nonseminomatous germ cell tumor, s/p orchiectomy & BEP x 3 cycles         ?  Chronic fatigue  January 1987     ?  Chronic kidney disease            renal failure, HD         ?  Essential tremor       ?  GERD (gastroesophageal reflux disease)       ?  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   Hyponatremia  01/21/2019     ?  Hypothyroid       ?  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  Lumbar radiculopathy  08/10/2012          S/p steroid injection by Dr Laverta Corfu 08/01/12          ?  Normal cardiac stress test  09/05/11     ?  Pleural effusion, bilateral  01/21/2019     ?  Pulmonary edema  01/21/2019     ?  Sleep disturbances            acting out in his sleep         ?  Thrombocytopenia ([place])  01/21/2019           Past Surgical History:         Procedure  Laterality  Date          ?  HX CHOLECYSTECTOMY    12/05/2011          abnormal HIDA scan (EF < 2%)          ?  HX COLONOSCOPY    07/12/10          hyperplastic polyp, repeat 10 yrs          ?  HX COLONOSCOPY    02/23/15          normal          ?  HX CYST REMOVAL  Right  01/06/2016          3rd toe ganglion cyst removal - podiatry (Dr Raliegh Ip)          ?  HX GI    09/09/11          EGD- normal          ?  HX HEENT  Left  06/2012          tonsilar bx: benign tissue (Dr Buddy Duty)          ?  HX ORCHIECTOMY  Left  2002     ?  HX ORTHOPAEDIC              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37          ?  HX SHOULDER ARTHROSCOPY  Right  08/13/2018           Social History          Tobacco Use         ?  Smoking status:  Never Smoker     ?  Smokeless tobacco:  Never Used       Substance Use Topics         ?  Alcohol use:  Yes              Frequency:  Never         Binge frequency:  Never             Comment: rarely           Family History         Problem  Relation  Age of Onset          ?  No Known Problems  Mother       ?  Parkinsonism  Father       ?  Arthritis-osteo  Father                scoliosis          ?  Dementia  Father       ?  No Known Problems  Brother       ?  No Known Problems  Brother       ?  No Known Problems  Sister       ?  Cancer  Maternal Grandfather  prostate          ?  Cancer  Paternal Grandfather                prostate          Current Outpatient Medications        Medication  Sig         ?  ondansetron hcl (ZOFRAN) 4 mg tablet   Take 1 Tab by mouth every eight (8) hours as needed for Nausea or Vomiting.     ?  levothyroxine (SYNTHROID) 25 mcg tablet  Take 1 Tab by mouth Daily (before breakfast).     ?  propranoloL (INDERAL) 20 mg tablet  Take 1 Tab by mouth two (2) times a day. Indications: gad     ?  FLUoxetine (PROzac) 20 mg capsule  Take 1 Cap by mouth daily. Indications: repeated episodes of anxiety     ?  pregabalin (LYRICA) 50 mg capsule  Take 1 Cap by mouth nightly. Max Daily Amount: 50 mg. For "sleep disturbances" - pt has had injuries from falling out of bed during sleep     ?  clomiPRAMINE (ANAFRANIL) 50 mg capsule  Take 150 mg by mouth nightly.     ?  clonazepam (KLONOPIN) 1 mg tablet  Take 1 mg by mouth two (2) times a day.         ?  tamsulosin (Flomax) 0.4 mg capsule  Take 0.4 mg by mouth daily.          No current facility-administered medications for this visit.            Allergies        Allergen  Reactions         ?  Latex  Other (comments)             redness            Review of Systems: A complete review of systems was obtained, negative except as described above.        Physical Exam:        Visit Vitals      BP  (!) 143/92     Pulse  83     Temp  97.6 ??F (36.4 ??C)     Ht  5' 11"  (1.803 m)     Wt  167 lb 3.2 oz (75.8 kg)     SpO2  96%        BMI  23.32 kg/m??        ECOG PS: 1   General: No distress, anxious   Eyes:  PERRLA, anicteric sclerae   HENT: Atraumatic, OP clear   Neck: Supple   Lymphatic: No cervical, supraclavicular, or inguinal adenopathy   Respiratory:  normal respiratory effort   CV: Normal rate, regular rhythm, no murmurs, no peripheral edema   GI: Soft, nontender   MS: Normal gait and station. Digits without clubbing or cyanosis.   Skin: No rashes, ecchymoses, or petechiae. Normal temperature, turgor, and texture.   Psych: Alert, oriented        Results:          Lab Results         Component  Value  Date/Time            WBC  4.2  02/11/2019 02:41 AM       HGB  7.9 (L)  02/11/2019 02:41 AM       HCT   23.2 (L)  02/11/2019 02:41 AM       PLATELET  125 (L)  02/11/2019 02:41 AM       MCV  93.2  02/11/2019 02:41 AM            ABS. NEUTROPHILS  2.4  02/11/2019 02:41 AM          Lab Results         Component  Value  Date/Time            Sodium  130 (L)  02/11/2019 02:41 AM       Potassium  3.0 (L)  02/11/2019 02:41 AM       Chloride  93 (L)  02/11/2019 02:41 AM       CO2  25  02/11/2019 02:41 AM       Glucose  100  02/11/2019 02:41 AM       BUN  37 (H)  02/11/2019 02:41 AM       Creatinine  7.80 (H)  02/11/2019 02:41 AM       GFR est AA  9 (L)  02/11/2019 02:41 AM       GFR est non-AA  7 (L)  02/11/2019 02:41 AM       Calcium  7.5 (L)  02/11/2019 02:41 AM            Glucose (POC)  87  06/26/2013 02:54 AM          Lab Results         Component  Value  Date/Time            Bilirubin, total  0.5  02/11/2019 02:41 AM       ALT (SGPT)  28  02/11/2019 02:41 AM       Alk. phosphatase  84  02/11/2019 02:41 AM       Protein, total  5.7 (L)  02/11/2019 02:41 AM       Albumin  3.2 (L)  02/11/2019 02:41 AM            Globulin  2.5  02/11/2019 02:41 AM           Outside    Records reviewed and summarized above.   Pathology report(s) reviewed above.      BONE MARROW BIOPSY 01/17/2019      Hypocellular   No dysplasia or blasts   1% kappa restricted plasma cells      Radiology report(s) reviewed above.   CT 2017 reviewed      CTA 01/25/2019   IMPRESSION   IMPRESSION: No pulmonary emboli. Other findings include interstitial edema,   bilateral infiltrates, bilateral effusions.        Assessment:     1) Left NSGCT- Stage III   S/P Orchiectomy and 3 cycles of BEP in 2002   2017 AFP, HCG and CT unremarkable       He is likely cured of this and per NCCN guidelines no additional surveillance is indicated      2) Leucopenia      This has resolved     BM biopsy 01/2019 Reviewed and is unremarkable apart from hypocellular marrow   1% kappa restricted plasma cells noted but do not necessarily imply a plasma cell dyscrasia   Will assess for  paraproteins      3) Anemia   Secondary to renal insufficiency      4) Thrombocytopenia   Fluctuates and is mild   From BM suppression       5)  ESRD on Dialysis      6) Anxiety        Plan:        ??  CBC diff, smear, Iron profile, ferritin, B12, Gammopathy eval      Will call with results and if unrevealing will not need to follow up      I appreciate the opportunity to participate in Mr.  John Friedman care.         Signed By:  Juliene Pina, MD

## 2019-03-06 NOTE — Progress Notes (Signed)
[facility]  [place], [place] [address]  W: [phone]  F: [phone]    Reason for Visit:   John Friedman is a 53 y.o. 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    Negative tumor markers and CT scans in 2017. He has relocated to [place].   He has had  Leucopenia since 2007, worsening anemia and fluctuating thrombocytopenia. 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 He thinks its from chronic obstruction. He states that he had A BM biopsy at [facility] in June 2020 when he was admitted with SOB secondary to fluid overload and pleural effusions  He is on Epogen per nephrology    His SOB is at baseline, has chronic diarrhea, has no fevers, bleeding, has anxiety, has no fevers, chills, sweats, he has no rashes, has stable weight and appetite. Lives alone. Mother is 64 and lives in [place]      Past Medical History:   Diagnosis Date   ??? Anemia 01/21/2019   ??? ARF (acute renal failure) ([place])     on HD   ??? BPH (benign prostatic hyperplasia)    ??? Cancer ([place])     left testicle- stage III, nonseminomatous germ cell tumor, s/p orchiectomy & BEP x 3 cycles   ??? Chronic fatigue January 1987   ??? Chronic kidney disease     renal failure, HD   ??? Essential tremor    ??? GERD (gastroesophageal reflux disease)    ??? 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 Hyponatremia 01/21/2019   ??? Hypothyroid    ??? 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 Lumbar radiculopathy 08/10/2012    S/p steroid injection by Dr Laverta Ruby 08/01/12    ??? Normal cardiac stress test 09/05/11   ??? Pleural effusion, bilateral 01/21/2019   ??? Pulmonary edema 01/21/2019   ??? Sleep disturbances     acting out in his sleep   ??? Thrombocytopenia ([place]) 01/21/2019      Past Surgical History:    Procedure Laterality Date   ??? HX CHOLECYSTECTOMY  12/05/2011    abnormal HIDA scan (EF < 2%)   ??? HX COLONOSCOPY  07/12/10    hyperplastic polyp, repeat 10 yrs   ??? HX COLONOSCOPY  02/23/15    normal   ??? HX CYST REMOVAL Right 01/06/2016    3rd toe ganglion cyst removal - podiatry (Dr Raliegh Ip)   ??? HX GI  09/09/11    EGD- normal   ??? HX HEENT Left 06/2012    tonsilar bx: benign tissue (Dr Buddy Duty)   ??? HX ORCHIECTOMY Left 2002   ??? HX ORTHOPAEDIC      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63   ??? HX SHOULDER ARTHROSCOPY Right 08/13/2018      Social History     Tobacco Use   ??? Smoking status: Never Smoker   ??? Smokeless tobacco: Never Used   Substance Use Topics   ??? Alcohol use: Yes     Frequency: Never     Binge frequency: Never     Comment: rarely      Family History   Problem Relation Age  of Onset   ??? No Known Problems Mother    ??? Parkinsonism Father    ??? Arthritis-osteo Father         scoliosis   ??? Dementia Father    ??? No Known Problems Brother    ??? No Known Problems Brother    ??? No Known Problems Sister    ??? Cancer Maternal Grandfather         prostate   ??? Cancer Paternal Grandfather         prostate     Current Outpatient Medications   Medication Sig   ??? ondansetron hcl (ZOFRAN) 4 mg tablet Take 1 Tab by mouth every eight (8) hours as needed for Nausea or Vomiting.   ??? levothyroxine (SYNTHROID) 25 mcg tablet Take 1 Tab by mouth Daily (before breakfast).   ??? propranoloL (INDERAL) 20 mg tablet Take 1 Tab by mouth two (2) times a day. Indications: gad   ??? FLUoxetine (PROzac) 20 mg capsule Take 1 Cap by mouth daily. Indications: repeated episodes of anxiety   ??? pregabalin (LYRICA) 50 mg capsule Take 1 Cap by mouth nightly. Max Daily Amount: 50 mg. For "sleep disturbances" - pt has had injuries from falling out of bed during sleep   ??? clomiPRAMINE (ANAFRANIL) 50 mg capsule Take 150 mg by mouth nightly.   ??? clonazepam (KLONOPIN) 1 mg tablet Take 1 mg by mouth two (2) times a day.    ??? tamsulosin (Flomax) 0.4 mg capsule Take 0.4 mg by mouth daily.     No current facility-administered medications for this visit.       Allergies   Allergen Reactions   ??? Latex Other (comments)     redness        Review of Systems: A complete review of systems was obtained, negative except as described above.    Physical Exam:     Visit Vitals  BP (!) 143/92   Pulse 83   Temp 97.6 ??F (36.4 ??C)   Ht '5\' 11"'  (1.803 m)   Wt 167 lb 3.2 oz (75.8 kg)   SpO2 96%   BMI 23.32 kg/m??     ECOG PS: 1  General: No distress, anxious  Eyes: PERRLA, anicteric sclerae  HENT: Atraumatic, OP clear  Neck: Supple  Lymphatic: No cervical, supraclavicular, or inguinal adenopathy  Respiratory:  normal respiratory effort  CV: Normal rate, regular rhythm, no murmurs, no peripheral edema  GI: Soft, nontender  MS: Normal gait and station. Digits without clubbing or cyanosis.  Skin: No rashes, ecchymoses, or petechiae. Normal temperature, turgor, and texture.  Psych: Alert, oriented    Results:     Lab Results   Component Value Date/Time    WBC 4.2 02/11/2019 02:41 AM    HGB 7.9 (L) 02/11/2019 02:41 AM    HCT 23.2 (L) 02/11/2019 02:41 AM    PLATELET 125 (L) 02/11/2019 02:41 AM    MCV 93.2 02/11/2019 02:41 AM    ABS. NEUTROPHILS 2.4 02/11/2019 02:41 AM     Lab Results   Component Value Date/Time    Sodium 130 (L) 02/11/2019 02:41 AM    Potassium 3.0 (L) 02/11/2019 02:41 AM    Chloride 93 (L) 02/11/2019 02:41 AM    CO2 25 02/11/2019 02:41 AM    Glucose 100 02/11/2019 02:41 AM    BUN 37 (H) 02/11/2019 02:41 AM    Creatinine 7.80 (H) 02/11/2019 02:41 AM    GFR est AA 9 (L) 02/11/2019 02:41 AM  GFR est non-AA 7 (L) 02/11/2019 02:41 AM    Calcium 7.5 (L) 02/11/2019 02:41 AM    Glucose (POC) 87 06/26/2013 02:54 AM     Lab Results   Component Value Date/Time    Bilirubin, total 0.5 02/11/2019 02:41 AM    ALT (SGPT) 28 02/11/2019 02:41 AM    Alk. phosphatase 84 02/11/2019 02:41 AM    Protein, total 5.7 (L) 02/11/2019 02:41 AM     Albumin 3.2 (L) 02/11/2019 02:41 AM    Globulin 2.5 02/11/2019 02:41 AM       Outside   Records reviewed and summarized above.  Pathology report(s) reviewed above.    BONE MARROW BIOPSY 01/17/2019    Hypocellular  No dysplasia or blasts  1% kappa restricted plasma cells    Radiology report(s) reviewed above.  CT 2017 reviewed    CTA 01/25/2019  IMPRESSION  IMPRESSION: No pulmonary emboli. Other findings include interstitial edema,  bilateral infiltrates, bilateral effusions.    Assessment:   1) Left NSGCT- Stage III  S/P Orchiectomy and 3 cycles of BEP in 2002  2017 AFP, HCG and CT unremarkable     He is likely cured of this and per NCCN guidelines no additional surveillance is indicated    2) Leucopenia    This has resolved    BM biopsy 01/2019 Reviewed and is unremarkable apart from hypocellular marrow  1% kappa restricted plasma cells noted but do not necessarily imply a plasma cell dyscrasia  Will assess for paraproteins    3) Anemia  Secondary to renal insufficiency    4) Thrombocytopenia  Fluctuates and is mild  From BM suppression     5) ESRD on Dialysis    6) Anxiety    Plan:     ?? CBC diff, smear, Iron profile, ferritin, B12, Gammopathy eval    Will call with results and if unrevealing will not need to follow up    I appreciate the opportunity to participate in Mr. GLENVILLE ESPINA care.    Signed By: Juliene Pina, MD

## 2019-03-06 NOTE — Progress Notes (Signed)
John Friedman is a 53 y.o. male  Chief Complaint   Patient presents with   ??? New Patient     testicular cancer   1. Have you been to the ER, urgent care clinic since your last visit?  Hospitalized since your last visit? Yes. 01/11/2019    2. Have you seen or consulted any other health care providers outside of the [facility] since your last visit?  Include any pap smears or colon screening. [facility] 01/11/2019-01/18/2019.. [facility]

## 2019-03-06 NOTE — Progress Notes (Signed)
This note will not be viewable in [place].    Oncology Navigator  Psychosocial Assessment    Reason for Assessment:    [] Depression  [] Anxiety  [] Caregiver Burden  [] Maladaptive Coping with Serious Illness   []  Social Work Referral [x]  Initial Assessment  []  Other     Sources of Information:    [x] Patient  [] Family  [] Staff  [] Medical Record    Advance Care Planning:  Advance Care Planning 02/12/2019   Patient's Healthcare Decision Maker is: -   Confirm Advance Directive None   Patient Would Like to Complete Advance Directive No   Patient does not have an advanced medical directive, and did not express interest in completing one today.    Mental Status:    [x] Alert  [] Lethargic  [] Unresponsive   []  Unable to assess   Oriented to:  [x] Person  [x] Place  [x] Time  [x] Situation      Barriers to Learning:    [] Language  [] Developmental  [] Cognitive  [] Altered Mental Status  [] Visual/Hearing Impairment  [] Unable to Read/Write  [] Motivational   [x] No Barriers Identified  [] Other:    Relationship Status:  [x] Single  [] Married  [] Significant Other/Life Partner  [] Divorced  [] Separated  [] Widowed      Living Circumstances:  [x] Lives Alone  [] Family/Significant Other in Household  [] Roommates  [] Children in the Home  [] Paid Caregivers  [] Assisted Living Facility/Group Home  [] [place]  [] Homeless  [] Incarcerated  [] Environmental/Care Concerns  [] other:    Employment Status:  [] Employed Full-time [] Employed Part-time [] Homemaker []  Disabled  []  Retired [x] Other: Patient is on short term disability.  May need to apply for long term disability.      Support System:    [] Strong  [] Fair  [x] Limited    Financial/Legal Concerns:    [] Uninsured  [] Limited Income/Resources  [] Non-Citizen  [x] No Concerns Identified  [] Financial POA:    [] Other:    Religious/Spiritual/Existential:  [] Strong Sense of Spirituality  [] Involved in Faith Community  [] Request Chaplain Visit  [] Expressing Spiritual/Existential Angst  [x] No  Concerns Identified    Coping with Illness:         Patient: Family/Caregiver:   Understanding and Acceptance of Illness/Prognosis  [x]  []    Strong Sense of Resilience [x]  []    Self Reflection [x]  []    Engaged Support System []  []    Does not Readily Discuss Illness []  []    Denial of Terminal Status []  []    Anger []  []    Depression []  []    Anxiety/Fear [x]  []    Bargaining []  []    Recent Diagnosis/Prognosis []  []    Difficulties with Body Image []  []    Loss of Identity []  []    Excessive Substance Use []  []    Mental Health History []  []    Enmeshed Relationships []  []    History of Loss []  []    Anticipatory Grief []  []    Concern for Complicated Grief []  []    Suicidal Ideation or Plan []  []    Unable to assess []  [x]             Narrative:   Met with the patient during his office visit today.  Patient is being seen for evaluation of Left testicular cancer- NSGCT III.  Patient's PCP is Dr. Renaee Munda.      Patient lives alone.  He is single with no children.  Patient explained that he was adopted.  His biological family lives in [place], but he does not have much contact with them.  He has one friend from work, Elenora Fender,  who checks on him via phone.  His adoptive mother is 67 years old and lives in [place].  His adoptive sister lives in [place] as well, but has not spoken to the patient or his mother in some time now.  The patient explained that his mother is a Ship broker.  The patient described his own hoarding tendencies, stating "I haven't cleaned in at least 6 months, because of my chronic fatigue syndrome, and nausea.  I can't eat in my house.  I go to either [employer] or [employer] for my meals.  There are things all over the floor in my apartment.  [employer] is coming for the next two days to help me clean out the apartment.  I think I'll need a new mattress and maybe new flooring."      Patient has had a hospitalization at [facility] in June  2020, and three subsequent hospitalizations at [facility].  He has been attempting to do intermittent catheterization, but this has been challenging due to his essential tremor.  He is on dialysis through [facility] on Monday, Wednesday, Fridays.  He is followed by Dr. Iona Beard, nephrologist.      The patient is on short term disability through work.  He wonders if his health challenges will continue, and if he will need to apply for disability.  He has long term disability through work if needed.  Provided information on when, where, and how to apply for Social Security Disability.  Provided a checklist from the [employer] as well as the [employer] on Disability.    Provided this social worker's contact information and offered continued support as needed.      Assessment/Action:   1.  Introduced self and role of this Education officer, museum in the [employer].    2.  Ongoing psychosocial support as desired by patient.      Plan/Referral:   No referrals placed at this time.   John Dickens Tykira Wachs, LCSW

## 2019-03-06 NOTE — Addendum Note (Signed)
Addended by: Philis Fendt A on: 03/07/2019 09:46 AM     Modules accepted: Level of Service

## 2019-03-06 NOTE — Progress Notes (Signed)
 This note will not be viewable in MyChart.    Oncology Navigator  Psychosocial Assessment    Reason for Assessment:    [] Depression  [] Anxiety  [] Caregiver Burden  [] Maladaptive Coping with Serious Illness   []  Social Work Referral [x]  Initial Assessment  []  Other     Sources of Information:    [x] Patient  [] Family  [] Staff  [] Medical Record    Advance Care Planning:  Advance Care Planning 02/12/2019   Patient's Healthcare Decision Maker is: -   Confirm Advance Directive None   Patient Would Like to Complete Advance Directive No   Patient does not have an advanced medical directive, and did not express interest in completing one today.    Mental Status:    [x] Alert  [] Lethargic  [] Unresponsive   []  Unable to assess   Oriented to:  [x] Person  [x] Place  [x] Time  [x] Situation      Barriers to Learning:    [] Language  [] Developmental  [] Cognitive  [] Altered Mental Status  [] Visual/Hearing Impairment  [] Unable to Read/Write  [] Motivational   [x] No Barriers Identified  [] Other:    Relationship Status:  [x] Single  [] Married  [] Significant Other/Life Partner  [] Divorced  [] Separated  [] Widowed      Living Circumstances:  [x] Lives Alone  [] Family/Significant Other in Household  [] Roommates  [] Children in the Home  [] Paid Caregivers  [] Assisted Living Facility/Group Home  [] Skilled Nursing Facility  [] Homeless  [] Incarcerated  [] Environmental/Care Concerns  [] other:    Employment Status:  [] Employed Full-time [] Employed Part-time [] Homemaker []  Disabled  []  Retired [x] Other: Patient is on short term disability.  May need to apply for long term disability.      Support System:    [] Strong  [] Fair  [x] Limited    Financial/Legal Concerns:    [] Uninsured  [] Limited Income/Resources  [] Non-Citizen  [x] No Concerns Identified  [] Financial POA:    [] Other:    Religious/Spiritual/Existential:  [] Strong Sense of Spirituality  [] Involved in Faith Community  [] Request Chaplain Visit  [] Expressing Spiritual/Existential Angst  [x] No  Concerns Identified    Coping with Illness:         Patient: Family/Caregiver:   Understanding and Acceptance of Illness/Prognosis  [x]  []    Strong Sense of Resilience [x]  []    Self Reflection [x]  []    Engaged Support System []  []    Does not Readily Discuss Illness []  []    Denial of Terminal Status []  []    Anger []  []    Depression []  []    Anxiety/Fear [x]  []    Bargaining []  []    Recent Diagnosis/Prognosis []  []    Difficulties with Body Image []  []    Loss of Identity []  []    Excessive Substance Use []  []    Mental Health History []  []    Enmeshed Relationships []  []    History of Loss []  []    Anticipatory Grief []  []    Concern for Complicated Grief []  []    Suicidal Ideation or Plan []  []    Unable to assess []  [x]             Narrative:   Met with the patient during his office visit today.  Patient is being seen for evaluation of Left testicular cancer- NSGCT III.  Patient's PCP is Dr. Lonni Hint.      Patient lives alone.  He is single with no children.  Patient explained that he was adopted.  His biological family lives in [place] , but he does not have much contact with them.  He has one friend from work, Medford Medley,  who checks on him via phone.  His adoptive mother is 68 years old and lives in [place] .  His adoptive sister lives in [place]  as well, but has not spoken to the patient or his mother in some time now.  The patient explained that his mother is a Chartered loss adjuster.  The patient described his own hoarding tendencies, stating I haven't cleaned in at least 6 months, because of my chronic fatigue syndrome, and nausea.  I can't eat in my house.  I go to either [employer] or [employer] for my meals.  There are things all over the floor in my apartment.  [employer] is coming for the next two days to help me clean out the apartment.  I think I'll need a new mattress and maybe new flooring.      Patient has had a hospitalization at [facility] in June 2020, and three subsequent hospitalizations at  [facility].  He has been attempting to do intermittent catheterization, but this has been challenging due to his essential tremor.  He is on dialysis through [facility] on Monday, Wednesday, Fridays.  He is followed by Dr. Zachary, nephrologist.      The patient is on short term disability through work.  He wonders if his health challenges will continue, and if he will need to apply for disability.  He has long term disability through work if needed.  Provided information on when, where, and how to apply for Social Security Disability.  Provided a checklist from the [employer] as well as the [employer] on Disability.    Provided this social worker's contact information and offered continued support as needed.      Assessment/Action:   1.  Introduced self and role of this Child psychotherapist in the [employer].    2.  Ongoing psychosocial support as desired by patient.      Plan/Referral:   No referrals placed at this time.   Damien SQUIBB Carretto, LCSW

## 2019-03-06 NOTE — Progress Notes (Signed)
 John Friedman is a 53 y.o. male  Chief Complaint   Patient presents with   . New Patient     testicular cancer   1. Have you been to the ER, urgent care clinic since your last visit?  Hospitalized since your last visit? Yes. 01/11/2019    2. Have you seen or consulted any other health care providers outside of the [facility] since your last visit?  Include any pap smears or colon screening. [facility] 01/11/2019-01/18/2019.. [facility]

## 2019-03-07 ENCOUNTER — Encounter: Attending: Internal Medicine | Primary: Internal Medicine

## 2019-03-12 NOTE — Telephone Encounter (Signed)
Please sent note: 6/16, 6/24, 7/10, and 7/20

## 2019-03-12 NOTE — Telephone Encounter (Signed)
[place], [place] (Self) [phone] (H)     [name] requesting that office notes be sent to [facility] [phone] (f), they have notes for why pt is referred to them, but they want some records from before his problem started.  Their phone number is [facility] [phone]

## 2019-03-13 NOTE — Telephone Encounter (Signed)
Faxed notes to [name] and received confirmation.

## 2019-03-14 NOTE — Telephone Encounter (Signed)
[name] has access to all our records via [place].  They are on the same EMR - EPIC.  The notes should be enough and they can get everything else they need.

## 2019-03-15 NOTE — Telephone Encounter (Signed)
Left a voicemail for patient to inform him that [name] should be able to access records per Dr. Amedeo Plenty.

## 2019-03-18 ENCOUNTER — Encounter

## 2019-03-21 ENCOUNTER — Inpatient Hospital Stay: Admit: 2019-03-21 | Discharge: 2019-03-21 | Payer: PRIVATE HEALTH INSURANCE | Attending: Emergency Medicine

## 2019-03-21 ENCOUNTER — Emergency Department: Admit: 2019-03-22 | Payer: PRIVATE HEALTH INSURANCE | Primary: Internal Medicine

## 2019-03-21 DIAGNOSIS — R0602 Shortness of breath: Secondary | ICD-10-CM

## 2019-03-21 DIAGNOSIS — J90 Pleural effusion, not elsewhere classified: Secondary | ICD-10-CM

## 2019-03-21 NOTE — ED Notes (Signed)
Called to triage, no answer. Screening staff says he left saying it was ridiculous for him to wait and that he was going to [facility]

## 2019-03-21 NOTE — ED Notes (Signed)
I have reviewed discharge instructions with the patient.  The patient verbalized understanding.

## 2019-03-21 NOTE — ED Provider Notes (Signed)
Please note that this dictation was completed with Dragon, the computer voice recognition software. ??Quite often unanticipated grammatical, syntax, homophones, and other interpretive errors are inadvertently transcribed by the computer software. ??Please disregard these errors. ??Please excuse any errors that have escaped final proofreading.    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  Patient states he completed dialysis last night was fine is been afebrile no cough.  Patient states he noticed he had trouble laying flat to sleep last night but was able to sleep sitting up.  Patient states he did not think much of it today maybe noticed some slight dyspnea with exertion but otherwise was fine tolerating p.o. normally.  Patient's been afebrile.  Patient states he had been exposed maybe to [place] but was recently tested and was negative (1 month ago).  He denies interval COVID exposures.  He denies runny nose sore throat earaches.  Patient states he then was cannot lay down to go to sleep and noticed he was feeling more short of breath when he laid down so [facility] and did an x-ray and told there was something on his chest x-ray.  They then sent to [facility] patient decided he did not want to wait in [place] waiting room around "all the sick people" so he drove himself to this ER for further evaluation.  Discussion with the patient we opted to skip the  chest x-ray as his previous x-ray from 2 weeks ago shows of increased size right-sided pleural effusion and we will get a chest CT as patient has dialysis scheduled for tomorrow.  He agrees with these plans.  Patient had "used to have trouble maintaining my dry weight and I would drink many fluids and become fluid overloaded and [facility] but have gotten much better at it since then."      pt denies HA, vison changes, diff swallowing, CP,  Abd pain, F/Ch, N/V, D/Cons or other current systemic complaints    Social/ PSH reviewed in EMR    EMR Chart Reviewed           Past Medical History:   Diagnosis Date   ??? Anemia 01/21/2019   ??? ARF (acute renal failure) ([place])     on HD   ??? BPH (benign prostatic hyperplasia)    ??? Cancer ([place])     left testicle- stage III, nonseminomatous germ cell tumor, s/p orchiectomy & BEP x 3 cycles   ??? Chronic fatigue January 1987   ??? Chronic kidney disease     renal failure, HD   ??? Essential tremor    ??? GERD (gastroesophageal reflux disease)    ??? 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 Hyponatremia 01/21/2019   ??? Hypothyroid    ??? 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 Lumbar radiculopathy 08/10/2012    S/p steroid injection by Dr Laverta [place] 08/01/12    ??? Normal cardiac stress  test 09/05/11   ??? Pleural effusion, bilateral 01/21/2019   ??? Pulmonary edema 01/21/2019   ??? Sleep disturbances     acting out in his sleep   ??? Thrombocytopenia ([place]) 01/21/2019       Past Surgical History:   Procedure Laterality Date   ??? HX CHOLECYSTECTOMY  12/05/2011    abnormal HIDA scan (EF < 2%)   ??? HX COLONOSCOPY  07/12/10    hyperplastic polyp, repeat 10 yrs   ??? HX COLONOSCOPY  02/23/15    normal   ??? HX CYST REMOVAL Right 01/06/2016    3rd toe ganglion cyst removal - podiatry (Dr Raliegh Ip)   ??? HX GI  09/09/11    EGD- normal   ??? HX HEENT Left 06/2012    tonsilar bx: benign tissue (Dr Buddy Duty)   ??? HX ORCHIECTOMY Left 2002   ??? HX ORTHOPAEDIC       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69   ??? HX SHOULDER ARTHROSCOPY Right 08/13/2018         Family History:   Problem Relation Age of Onset   ??? No Known Problems Mother    ??? Parkinsonism Father    ??? Arthritis-osteo Father         scoliosis   ??? Dementia Father    ??? No Known Problems Brother    ??? No Known Problems Brother    ??? No Known Problems Sister    ??? Cancer Maternal Grandfather         prostate   ??? Cancer Paternal Grandfather         prostate       Social History     Socioeconomic History   ??? Marital status: SINGLE     Spouse name: Not on file   ??? Number of children: Not on file   ??? Years of education: Not on file   ??? Highest education level: Not on file   Occupational History   ??? Not on file   Social Needs   ??? Financial resource strain: Not on file   ??? Food insecurity     Worry: Not on file     Inability: Not on file   ??? Transportation needs     Medical: Not on file     Non-medical: Not on file   Tobacco Use   ??? Smoking status: Never Smoker   ??? Smokeless tobacco: Never Used   Substance and Sexual Activity   ??? Alcohol use: Yes     Frequency: Never     Binge frequency: Never     Comment: rarely   ??? Drug use: No   ??? Sexual activity: Not Currently     Partners: Female   Lifestyle   ??? Physical activity     Days per week: Not on file     Minutes per session: Not on file   ??? Stress: Not on file   Relationships   ??? Social Product manager on phone: Not on file     Gets together: Not on file     Attends religious service: Not on file     Active member of club or organization: Not on file     Attends meetings of clubs or organizations: Not on file     Relationship status: Not on file   ??? Intimate partner violence     Fear of current or ex partner: Not on file     Emotionally abused: Not on file  Physically abused: Not on file     Forced sexual activity: Not on file   Other Topics Concern   ??? Not on file   Social History Narrative   ??? Not on file         ALLERGIES: Latex    Review of Systems   Constitutional: Negative  for chills and fever.   HENT: Negative for drooling, trouble swallowing and voice change.    Eyes: Negative for visual disturbance.   Respiratory: Positive for chest tightness and shortness of breath. Negative for cough, wheezing and stridor.    Cardiovascular: Positive for chest pain. Negative for palpitations and leg swelling.   Gastrointestinal: Negative for abdominal pain, diarrhea, nausea and vomiting.   Genitourinary: Negative for dysuria.   Musculoskeletal: Negative for arthralgias and myalgias.   Skin: Negative for rash.   Neurological: Negative for speech difficulty.   All other systems reviewed and are negative.      Vitals:    03/21/19 2014   BP: (!) 142/100   Pulse: 99   Resp: 22   Temp: 97.8 ??F (36.6 ??C)   Weight: 75.4 kg (166 lb 3.6 oz)   Height: 5\' 11"  (1.803 m)            Physical Exam  Vitals signs and nursing note reviewed.   Constitutional:       General: He is not in acute distress.     Appearance: Normal appearance. He is well-developed. He is not ill-appearing, toxic-appearing or diaphoretic.      Comments: NAD, AxOx4, speaking in complete sentences    sats = 97% on RA;    HENT:      Head: Normocephalic and atraumatic.      Right Ear: External ear normal.      Left Ear: External ear normal.      Mouth/Throat:      Pharynx: No oropharyngeal exudate.   Eyes:      General:         Right eye: No discharge.         Left eye: No discharge.      Extraocular Movements: Extraocular movements intact.      Conjunctiva/sclera: Conjunctivae normal.      Pupils: Pupils are equal, round, and reactive to light.   Neck:      Musculoskeletal: Normal range of motion and neck supple.   Cardiovascular:      Rate and Rhythm: Normal rate and regular rhythm.      Pulses: Normal pulses.      Heart sounds: Normal heart sounds. No murmur. No friction rub. No gallop.    Pulmonary:      Effort: Pulmonary effort is normal. No respiratory distress.      Breath sounds: Normal breath sounds. No stridor. No wheezing, rhonchi or  rales.   Chest:      Chest wall: No tenderness.   Abdominal:      General: Bowel sounds are normal. There is no distension.      Palpations: Abdomen is soft. There is no mass.      Tenderness: There is no abdominal tenderness. There is no guarding or rebound.   Genitourinary:     Comments: Pt denies urinary/ Testicular/ scrotal or penile  complaints  Musculoskeletal: Normal range of motion.         General: No swelling, tenderness, deformity or signs of injury.      Right lower leg: No edema.      Left lower  leg: No edema.   Lymphadenopathy:      Cervical: No cervical adenopathy.   Skin:     General: Skin is warm and dry.      Capillary Refill: Capillary refill takes less than 2 seconds.      Findings: No bruising, erythema or rash.   Neurological:      General: No focal deficit present.      Mental Status: He is alert and oriented to person, place, and time.      Cranial Nerves: No cranial nerve deficit.      Sensory: No sensory deficit.      Motor: No weakness.      Coordination: Coordination normal.      Gait: Gait normal.      Deep Tendon Reflexes: Reflexes normal.      Comments: pt has motor/ CV/ Sensation grossly intact to all extremities, R = L in strength;          MDM       Procedures    Chief Complaint   Patient presents with   ??? Shortness of Breath       8:16 PM  The patients presenting problems have been discussed, and they are in agreement with the care plan formulated and outlined with them.  I have encouraged them to ask questions as they arise throughout their visit.    MEDICATIONS GIVEN:  Medications - No data to display    LABS REVIEWED:  Labs Reviewed   SAMPLES BEING HELD       RADIOLOGY RESULTS:  The following have been ordered and reviewed:  _____________________________________________________________________  _____________________________________________________________________    EKG interpretation:   Rhythm: normal sinus rhythm; and regular . Rate (approx.): 86; Axis: normal; P wave: normal;  QRS interval: normal ; ST/T wave: normal; Negative acute significant segmental elevations/ unchanged compared to study dated 02/11/2019    PROCEDURES:        CONSULTATIONS:       PROGRESS NOTES:      DIAGNOSIS:    1. Pleural effusion    2. Dialysis patient [facility])        PLAN:  1-d/c home;       ED COURSE: The patients hospital course has been uncomplicated.      10:07 PM Discussed improved pulmonary effusions/ offered/ refused admission/ 'will take disc to my Nephrologist tomorrow';  NELDON SHEPARD  results have been reviewed with him.  He has been counseled regarding his diagnosis.  He verbally conveys understanding and agreement of the signs, symptoms, diagnosis, treatment and prognosis and additionally agrees to Call/ Arrange follow up as recommended with Dr. Amedeo Plenty, Suann Larry, MD in 24 - 48 hours.  He also agrees with the care-plan and conveys that all of his questions have been answered.  I have also put together some discharge instructions for him that include: 1) educational information regarding their diagnosis, 2) how to care for their diagnosis at home, as well a 3) list of reasons why they would want to return to the ED prior to their follow-up appointment, should their condition change or for concerns.

## 2019-03-21 NOTE — ED Triage Notes (Signed)
Pt arrives from [facility] with SOB and some chest pressure, hx of HD and has not missed any HD. Pt reports having frequent ER trips for similar episodes. [name] very anxious during triage.

## 2019-03-21 NOTE — ED Provider Notes (Signed)
Please note that this dictation was completed with Dragon, the computer voice recognition software. ??Quite often unanticipated grammatical, syntax, homophones, and other interpretive errors are inadvertently transcribed by the computer software. ??Please disregard these errors. ??Please excuse any errors that have escaped final proofreading.    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  Patient states he completed dialysis last night was fine is been afebrile no cough.  Patient states he noticed he had trouble laying flat to sleep last night but was able to sleep sitting up.  Patient states he did not think much of it today maybe noticed some slight dyspnea with exertion but otherwise was fine tolerating p.o. normally.  Patient's been afebrile.  Patient states he had been exposed maybe to [place] but was recently tested and was negative (1 month ago).  He denies interval COVID exposures.  He denies runny nose sore throat earaches.  Patient states he then was cannot lay down to go to sleep and noticed he was feeling more short of breath when he laid down so [facility] lab work and did an x-ray and told there was something on his chest x-ray.  They then sent to [facility] patient decided he did not want to wait in [place] waiting room around "all the sick people" so he drove himself to this ER for further evaluation.   Discussion with the patient we opted to skip the chest x-ray as his previous x-ray from 2 weeks ago shows of increased size right-sided pleural effusion and we will get a chest CT as patient has dialysis scheduled for tomorrow.  He agrees with these plans.  Patient had "used to have trouble maintaining my dry weight and I would drink many fluids and become fluid overloaded and was actually seen and admitted and dialyzed 3 times at [facility] since May but have gotten much better at it since then."      pt denies HA, vison changes, diff swallowing, CP,  Abd pain, F/Ch, N/V, D/Cons or other current systemic complaints    Social/ PSH reviewed in EMR    EMR Chart Reviewed           Past Medical History:   Diagnosis Date   ??? Anemia 01/21/2019   ??? ARF (acute renal failure) ([place])     on HD   ??? BPH (benign prostatic hyperplasia)    ??? Cancer ([place])     left testicle- stage III, nonseminomatous germ cell tumor, s/p orchiectomy & BEP x 3 cycles   ??? Chronic fatigue January 1987   ??? Chronic kidney disease     renal failure, HD   ??? Essential tremor    ??? GERD (gastroesophageal reflux disease)    ??? 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 Hyponatremia 01/21/2019   ??? Hypothyroid    ??? 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 Lumbar radiculopathy 08/10/2012    S/p steroid injection by Dr Laverta [place] 08/01/12    ??? Normal cardiac stress  test 09/05/11   ??? Pleural effusion, bilateral 01/21/2019   ??? Pulmonary edema 01/21/2019   ??? Sleep disturbances     acting out in his sleep   ??? Thrombocytopenia ([place]) 01/21/2019       Past Surgical History:   Procedure Laterality Date   ??? HX CHOLECYSTECTOMY  12/05/2011    abnormal HIDA scan (EF < 2%)   ??? HX COLONOSCOPY  07/12/10    hyperplastic polyp, repeat 10 yrs   ??? HX COLONOSCOPY  02/23/15    normal   ??? HX CYST REMOVAL Right 01/06/2016    3rd toe ganglion cyst removal - podiatry (Dr Raliegh Ip)   ??? HX GI  09/09/11    EGD- normal   ??? HX HEENT Left 06/2012    tonsilar bx: benign tissue (Dr Buddy Duty)    ??? HX ORCHIECTOMY Left 2002   ??? HX ORTHOPAEDIC      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62   ??? HX SHOULDER ARTHROSCOPY Right 08/13/2018         Family History:   Problem Relation Age of Onset   ??? No Known Problems Mother    ??? Parkinsonism Father    ??? Arthritis-osteo Father         scoliosis   ??? Dementia Father    ??? No Known Problems Brother    ??? No Known Problems Brother    ??? No Known Problems Sister    ??? Cancer Maternal Grandfather         prostate   ??? Cancer Paternal Grandfather         prostate       Social History     Socioeconomic History   ??? Marital status: SINGLE     Spouse name: Not on file   ??? Number of children: Not on file   ??? Years of education: Not on file   ??? Highest education level: Not on file   Occupational History   ??? Not on file   Social Needs   ??? Financial resource strain: Not on file   ??? Food insecurity     Worry: Not on file     Inability: Not on file   ??? Transportation needs     Medical: Not on file     Non-medical: Not on file   Tobacco Use   ??? Smoking status: Never Smoker   ??? Smokeless tobacco: Never Used   Substance and Sexual Activity   ??? Alcohol use: Yes     Frequency: Never     Binge frequency: Never     Comment: rarely   ??? Drug use: No   ??? Sexual activity: Not Currently     Partners: Female   Lifestyle   ??? Physical activity     Days per week: Not on file     Minutes per session: Not on file   ??? Stress: Not on file   Relationships   ??? Social Product manager on phone: Not on file     Gets together: Not on file     Attends religious service: Not on file     Active member of club or organization: Not on file     Attends meetings of clubs or organizations: Not on file     Relationship status: Not on file   ??? Intimate partner violence     Fear of current or ex partner: Not on file     Emotionally abused: Not on file  Physically abused: Not on file     Forced sexual activity: Not on file   Other Topics Concern   ??? Not on file   Social History Narrative   ??? Not on file          ALLERGIES: Latex    Review of Systems   Constitutional: Negative for chills and fever.   HENT: Negative for drooling, trouble swallowing and voice change.    Eyes: Negative for visual disturbance.   Respiratory: Positive for chest tightness and shortness of breath. Negative for cough, wheezing and stridor.    Cardiovascular: Positive for chest pain. Negative for palpitations and leg swelling.   Gastrointestinal: Negative for abdominal pain, diarrhea, nausea and vomiting.   Genitourinary: Negative for dysuria.   Musculoskeletal: Negative for arthralgias and myalgias.   Skin: Negative for rash.   Neurological: Negative for speech difficulty.   All other systems reviewed and are negative.      Vitals:    03/21/19 2014   BP: (!) 142/100   Pulse: 99   Resp: 22   Temp: 97.8 ??F (36.6 ??C)   Weight: 75.4 kg (166 lb 3.6 oz)   Height: 5\' 11"  (1.803 m)            Physical Exam  Vitals signs and nursing note reviewed.   Constitutional:       General: He is not in acute distress.     Appearance: Normal appearance. He is well-developed. He is not ill-appearing, toxic-appearing or diaphoretic.      Comments: NAD, AxOx4, speaking in complete sentences    sats = 97% on RA;    HENT:      Head: Normocephalic and atraumatic.      Right Ear: External ear normal.      Left Ear: External ear normal.      Mouth/Throat:      Pharynx: No oropharyngeal exudate.   Eyes:      General:         Right eye: No discharge.         Left eye: No discharge.      Extraocular Movements: Extraocular movements intact.      Conjunctiva/sclera: Conjunctivae normal.      Pupils: Pupils are equal, round, and reactive to light.   Neck:      Musculoskeletal: Normal range of motion and neck supple.   Cardiovascular:      Rate and Rhythm: Normal rate and regular rhythm.      Pulses: Normal pulses.      Heart sounds: Normal heart sounds. No murmur. No friction rub. No gallop.    Pulmonary:      Effort: Pulmonary effort is normal. No respiratory distress.       Breath sounds: Normal breath sounds. No stridor. No wheezing, rhonchi or rales.   Chest:      Chest wall: No tenderness.   Abdominal:      General: Bowel sounds are normal. There is no distension.      Palpations: Abdomen is soft. There is no mass.      Tenderness: There is no abdominal tenderness. There is no guarding or rebound.   Genitourinary:     Comments: Pt denies urinary/ Testicular/ scrotal or penile  complaints  Musculoskeletal: Normal range of motion.         General: No swelling, tenderness, deformity or signs of injury.      Right lower leg: No edema.      Left lower  leg: No edema.   Lymphadenopathy:      Cervical: No cervical adenopathy.   Skin:     General: Skin is warm and dry.      Capillary Refill: Capillary refill takes less than 2 seconds.      Findings: No bruising, erythema or rash.   Neurological:      General: No focal deficit present.      Mental Status: He is alert and oriented to person, place, and time.      Cranial Nerves: No cranial nerve deficit.      Sensory: No sensory deficit.      Motor: No weakness.      Coordination: Coordination normal.      Gait: Gait normal.      Deep Tendon Reflexes: Reflexes normal.      Comments: pt has motor/ CV/ Sensation grossly intact to all extremities, R = L in strength;          MDM       Procedures    Chief Complaint   Patient presents with   ??? Shortness of Breath       8:16 PM  The patients presenting problems have been discussed, and they are in agreement with the care plan formulated and outlined with them.  I have encouraged them to ask questions as they arise throughout their visit.    MEDICATIONS GIVEN:  Medications - No data to display    LABS REVIEWED:  Labs Reviewed   SAMPLES BEING HELD       RADIOLOGY RESULTS:  The following have been ordered and reviewed:  _____________________________________________________________________  _____________________________________________________________________    EKG interpretation:    Rhythm: normal sinus rhythm; and regular . Rate (approx.): 86; Axis: normal; P wave: normal; QRS interval: normal ; ST/T wave: normal; Negative acute significant segmental elevations/ unchanged compared to study dated 02/11/2019    PROCEDURES:        CONSULTATIONS:       PROGRESS NOTES:      DIAGNOSIS:    1. Pleural effusion    2. Dialysis patient [facility])        PLAN:  1-d/c home;       ED COURSE: The patients hospital course has been uncomplicated.      10:07 PM Discussed improved pulmonary effusions/ offered/ refused admission/ 'will take disc to my Nephrologist tomorrow';  TAYON PAREKH  results have been reviewed with him.  He has been counseled regarding his diagnosis.  He verbally conveys understanding and agreement of the signs, symptoms, diagnosis, treatment and prognosis and additionally agrees to Call/ Arrange follow up as recommended with Dr. Amedeo Plenty, Suann Larry, MD in 24 - 48 hours.  He also agrees with the care-plan and conveys that all of his questions have been answered.  I have also put together some discharge instructions for him that include: 1) educational information regarding their diagnosis, 2) how to care for their diagnosis at home, as well a 3) list of reasons why they would want to return to the ED prior to their follow-up appointment, should their condition change or for concerns.

## 2019-03-21 NOTE — ED Triage Notes (Signed)
Called to triage, no answer. Screening staff says he left saying it was ridiculous for him to wait and that he was going to [place] ED

## 2019-03-21 NOTE — ED Notes (Signed)
Pt arrives from [facility] with SOB and some chest pressure, hx of HD and has not missed any HD. Pt reports having frequent ER trips for similar episodes. [name] very anxious during triage.

## 2019-03-22 ENCOUNTER — Inpatient Hospital Stay
Admit: 2019-03-22 | Discharge: 2019-03-22 | Disposition: A | Discharge status: Home / Self Care | Payer: PRIVATE HEALTH INSURANCE | Attending: Emergency Medicine

## 2019-03-22 LAB — LIPASE
Lipase: 183 U/L (ref 73–393)
Lipase: 183 U/L (ref 73–393)

## 2019-03-22 LAB — CBC WITH AUTOMATED DIFF
ABS. BASOPHILS: 0.1 10*3/uL (ref 0.0–0.1)
ABS. EOSINOPHILS: 0.2 10*3/uL (ref 0.0–0.4)
ABS. IMM. GRANS.: 0 10*3/uL (ref 0.00–0.04)
ABS. LYMPHOCYTES: 1.1 10*3/uL (ref 0.8–3.5)
ABS. MONOCYTES: 0.8 10*3/uL (ref 0.0–1.0)
ABS. NEUTROPHILS: 4.3 10*3/uL (ref 1.8–8.0)
ABSOLUTE NRBC: 0 10*3/uL (ref 0.00–0.01)
BASOPHILS: 1 % (ref 0–1)
EOSINOPHILS: 4 % (ref 0–7)
HCT: 35.6 % — ABNORMAL LOW (ref 36.6–50.3)
HGB: 11.2 g/dL — ABNORMAL LOW (ref 12.1–17.0)
IMMATURE GRANULOCYTES: 1 % — ABNORMAL HIGH (ref 0.0–0.5)
LYMPHOCYTES: 17 % (ref 12–49)
MCH: 31.3 PG (ref 26.0–34.0)
MCHC: 31.5 g/dL (ref 30.0–36.5)
MCV: 99.4 FL — ABNORMAL HIGH (ref 80.0–99.0)
MONOCYTES: 13 % (ref 5–13)
MPV: 10.5 FL (ref 8.9–12.9)
NEUTROPHILS: 64 % (ref 32–75)
NRBC: 0 PER 100 WBC
PLATELET: 102 10*3/uL — ABNORMAL LOW (ref 150–400)
RBC: 3.58 M/uL — ABNORMAL LOW (ref 4.10–5.70)
RDW: 15.2 % — ABNORMAL HIGH (ref 11.5–14.5)
WBC: 6.6 10*3/uL (ref 4.1–11.1)

## 2019-03-22 LAB — METABOLIC PANEL, COMPREHENSIVE
A-G Ratio: 1.1 (ref 1.1–2.2)
ALT (SGPT): 28 U/L (ref 12–78)
AST (SGOT): 23 U/L (ref 15–37)
Albumin: 3.6 g/dL (ref 3.5–5.0)
Alk. phosphatase: 100 U/L (ref 45–117)
Anion gap: 10 mmol/L (ref 5–15)
BUN/Creatinine ratio: 5 — ABNORMAL LOW (ref 12–20)
BUN: 24 MG/DL — ABNORMAL HIGH (ref 6–20)
Bilirubin, total: 0.5 MG/DL (ref 0.2–1.0)
CO2: 30 mmol/L (ref 21–32)
Calcium: 8.2 MG/DL — ABNORMAL LOW (ref 8.5–10.1)
Chloride: 96 mmol/L — ABNORMAL LOW (ref 97–108)
Creatinine: 4.56 MG/DL — ABNORMAL HIGH (ref 0.70–1.30)
GFR est AA: 16 mL/min/{1.73_m2} — ABNORMAL LOW (ref >60)
GFR est non-AA: 14 mL/min/{1.73_m2} — ABNORMAL LOW (ref >60)
Globulin: 3.3 g/dL (ref 2.0–4.0)
Glucose: 80 mg/dL (ref 65–100)
Potassium: 4 mmol/L (ref 3.5–5.1)
Protein, total: 6.9 g/dL (ref 6.4–8.2)
Sodium: 136 mmol/L (ref 136–145)

## 2019-03-22 LAB — EKG, 12 LEAD, INITIAL
Atrial Rate: 86 {beats}/min
Calculated P Axis: 57 degrees
Calculated R Axis: 61 degrees
Calculated T Axis: 73 degrees
Diagnosis: NORMAL
P-R Interval: 172 ms
Q-T Interval: 440 ms
QRS Duration: 94 ms
QTC Calculation (Bezet): 526 ms
Ventricular Rate: 86 {beats}/min

## 2019-03-22 LAB — PHOSPHORUS
Phosphorus: 5.1 MG/DL — ABNORMAL HIGH (ref 2.6–4.7)
Phosphorus: 5.1 MG/DL — ABNORMAL HIGH (ref 2.6–4.7)

## 2019-03-22 LAB — SAMPLES BEING HELD

## 2019-03-22 LAB — MAGNESIUM
Magnesium: 2.3 mg/dL (ref 1.6–2.4)
Magnesium: 2.3 mg/dL (ref 1.6–2.4)

## 2019-03-22 LAB — LACTIC ACID
Lactic Acid: 0.8 MMOL/L (ref 0.4–2.0)
Lactic acid: 0.8 MMOL/L (ref 0.4–2.0)

## 2019-03-22 LAB — TROPONIN I: Troponin-I, Qt.: <0.05 ng/mL (ref <0.05)

## 2019-03-22 LAB — COMPREHENSIVE METABOLIC PANEL
ALT: 28 U/L (ref 12–78)
AST: 23 U/L (ref 15–37)
Albumin/Globulin Ratio: 1.1 (ref 1.1–2.2)
Albumin: 3.6 g/dL (ref 3.5–5.0)
Alkaline Phosphatase: 100 U/L (ref 45–117)
Anion Gap: 10 mmol/L (ref 5–15)
BUN: 24 MG/DL — ABNORMAL HIGH (ref 6–20)
Bun/Cre Ratio: 5 — ABNORMAL LOW (ref 12–20)
CO2: 30 mmol/L (ref 21–32)
Calcium: 8.2 MG/DL — ABNORMAL LOW (ref 8.5–10.1)
Chloride: 96 mmol/L — ABNORMAL LOW (ref 97–108)
Creatinine: 4.56 MG/DL — ABNORMAL HIGH (ref 0.70–1.30)
EGFR IF NonAfrican American: 14 mL/min/{1.73_m2} — ABNORMAL LOW (ref >60)
GFR African American: 16 mL/min/{1.73_m2} — ABNORMAL LOW (ref >60)
Globulin: 3.3 g/dL (ref 2.0–4.0)
Glucose: 80 mg/dL (ref 65–100)
Potassium: 4 mmol/L (ref 3.5–5.1)
Sodium: 136 mmol/L (ref 136–145)
Total Bilirubin: 0.5 MG/DL (ref 0.2–1.0)
Total Protein: 6.9 g/dL (ref 6.4–8.2)

## 2019-03-22 LAB — CBC WITH AUTO DIFFERENTIAL
Basophils %: 1 % (ref 0–1)
Basophils Absolute: 0.1 10*3/uL (ref 0.0–0.1)
Eosinophils %: 4 % (ref 0–7)
Eosinophils Absolute: 0.2 10*3/uL (ref 0.0–0.4)
Granulocyte Absolute Count: 0 10*3/uL (ref 0.00–0.04)
Hematocrit: 35.6 % — ABNORMAL LOW (ref 36.6–50.3)
Hemoglobin: 11.2 g/dL — ABNORMAL LOW (ref 12.1–17.0)
Immature Granulocytes: 1 % — ABNORMAL HIGH (ref 0.0–0.5)
Lymphocytes %: 17 % (ref 12–49)
Lymphocytes Absolute: 1.1 10*3/uL (ref 0.8–3.5)
MCH: 31.3 PG (ref 26.0–34.0)
MCHC: 31.5 g/dL (ref 30.0–36.5)
MCV: 99.4 FL — ABNORMAL HIGH (ref 80.0–99.0)
MPV: 10.5 FL (ref 8.9–12.9)
Monocytes %: 13 % (ref 5–13)
Monocytes Absolute: 0.8 10*3/uL (ref 0.0–1.0)
NRBC Absolute: 0 10*3/uL (ref 0.00–0.01)
Neutrophils %: 64 % (ref 32–75)
Neutrophils Absolute: 4.3 10*3/uL (ref 1.8–8.0)
Nucleated RBCs: 0 PER 100 WBC
Platelets: 102 10*3/uL — ABNORMAL LOW (ref 150–400)
RBC: 3.58 M/uL — ABNORMAL LOW (ref 4.10–5.70)
RDW: 15.2 % — ABNORMAL HIGH (ref 11.5–14.5)
WBC: 6.6 10*3/uL (ref 4.1–11.1)

## 2019-03-22 LAB — EKG 12-LEAD
Atrial Rate: 86 {beats}/min
Diagnosis: NORMAL
P Axis: 57 degrees
P-R Interval: 172 ms
Q-T Interval: 440 ms
QRS Duration: 94 ms
QTc Calculation (Bazett): 526 ms
R Axis: 61 degrees
T Axis: 73 degrees
Ventricular Rate: 86 {beats}/min

## 2019-03-22 LAB — TROPONIN: Troponin I: <0.05 ng/mL (ref <0.05)

## 2019-03-22 MED ORDER — SODIUM CHLORIDE 0.9 % IV
Freq: Once | INTRAVENOUS | Status: AC
Start: 2019-03-22 — End: 2019-03-21
  Administered 2019-03-22: 01:00:00 via INTRAVENOUS

## 2019-03-22 MED ORDER — SODIUM CHLORIDE 0.9 % INJECTION
Freq: Once | INTRAMUSCULAR | Status: AC
Start: 2019-03-22 — End: 2019-03-21
  Administered 2019-03-22: 01:00:00 via INTRAVENOUS

## 2019-03-22 MED ORDER — IOPAMIDOL 61 % IV SOLN
61 % | Freq: Once | INTRAVENOUS | Status: AC
Start: 2019-03-22 — End: 2019-03-21
  Administered 2019-03-22: 01:00:00 via INTRAVENOUS

## 2019-03-22 MED FILL — ISOVUE-300  61 % INTRAVENOUS SOLUTION: 300 mg iodine /mL (61 %) | INTRAVENOUS | Qty: 100

## 2019-03-22 MED FILL — SODIUM CHLORIDE 0.9 % INJECTION: INTRAMUSCULAR | Qty: 10

## 2019-03-27 LAB — AMB EXT CREATININE
Creatinine, External: 4.2
Creatinine, External: 4.2 NA

## 2019-03-27 LAB — CULTURE, BLOOD: Culture result:: NO GROWTH

## 2019-03-27 LAB — CULTURE, BLOOD 1: Culture: NO GROWTH

## 2019-04-09 ENCOUNTER — Encounter: Attending: Internal Medicine | Primary: Internal Medicine

## 2019-04-15 ENCOUNTER — Ambulatory Visit: Attending: Internal Medicine | Primary: Internal Medicine

## 2019-04-15 ENCOUNTER — Encounter

## 2019-04-15 ENCOUNTER — Ambulatory Visit
Admit: 2019-04-15 | Discharge: 2019-04-15 | Payer: PRIVATE HEALTH INSURANCE | Attending: Internal Medicine | Primary: Internal Medicine

## 2019-04-15 DIAGNOSIS — R110 Nausea: Secondary | ICD-10-CM

## 2019-04-15 NOTE — Progress Notes (Signed)
Follow up.  Has question about CHF, nausea, disability has been extended.     Scheduled for kidney biopsy on September 8th at [facility].      Reports has never had chicken pox.

## 2019-04-15 NOTE — Progress Notes (Signed)
John Friedman is a 53 y.o. male who was seen in clinic today (04/15/2019).          Assessment & Plan:     Diagnoses and all orders for this visit are below.  I spent 15 minutes with the patient and >50% of the time was spent counseling him on differential dx and treatment options for issues below.    1. Nausea- this is a chronic problem but new to me, symptoms are: not changed, differential dx reviewed with the patient, exact etiology is unclear at this time.  Would favor due to HD as no obvious medication cause or medication changes.  Did review he should review medications w/ specialists.  Will continue w/ Zofran for now (prior to lunch and dinner).  Reviewed not a long term option.  Red flags were reviewed with the patient to RTC or notify me, expected time course for resolution reviewed.       2. Subclinical hypothyroidism- due for labs due to starting on medications, reviewed expectations  -     TSH 3RD GENERATION      Follow-up and Dispositions    ?? Return in about 6 weeks (around 05/27/2019) for Regular follow up.       Subjective:   [name] was seen today for Hypothyroidism and Stress        Since last visit: seen in ER for SOB/orthopnea (03/21/19).  Had bilateral pleural effusion (R>L).  No intervention.  He was called by [facility] the next day and told xray showed CHF.  He had a TTE on 01/26/19, we reviewed this.  EF: 55-60%.  Weight is down 25 lbs, he attributes to nausea and not being able to find something that he eats.  Nausea is constant.  No vomiting but some burping.  He is using zofran as needed with good relief but only temporary relief.  He has kidney bx set up for 9/8.      Endocrine Review  Patient is seen for followup of hypothyroidism.  Started on medication at last visit.  He reports medication compliance: compliant all of the time.  He reports the following concerns/problems/med side effects: none.  Lab review: labs reviewed and discussed with patient.         Prior to Admission medications     Medication Sig Start Date End Date Taking? Authorizing Provider   MULTIVITAMIN PO Take 1 Tab by mouth. rarely   Yes Provider, Historical   ondansetron hcl (ZOFRAN) 4 mg tablet Take 1 Tab by mouth every eight (8) hours as needed for Nausea or Vomiting. 03/04/19  Yes Antony Haste, MD   levothyroxine (SYNTHROID) 25 mcg tablet Take 1 Tab by mouth Daily (before breakfast). 02/22/19  Yes Antony Haste, MD   propranoloL (INDERAL) 20 mg tablet Take 1 Tab by mouth two (2) times a day. Indications: gad 02/12/19  Yes Epstein, Steffanie A, NP   FLUoxetine (PROzac) 20 mg capsule Take 1 Cap by mouth daily. Indications: repeated episodes of anxiety 02/12/19  Yes Epstein, Steffanie A, NP   clomiPRAMINE (ANAFRANIL) 50 mg capsule Take 150 mg by mouth nightly.   Yes Provider, Historical   clonazepam (KLONOPIN) 1 mg tablet Take 1 mg by mouth two (2) times a day.   Yes Provider, Historical   bethanechol chloride (URECHOLINE) 50 mg tablet Take 50 mg by mouth three (3) times daily. 04/03/19   Provider, Historical   pregabalin (LYRICA) 50 mg capsule Take 1 Cap by mouth nightly.  Max Daily Amount: 50 mg. For "sleep disturbances" - pt has had injuries from falling out of bed during sleep 01/29/19   Antony Haste, MD   tamsulosin (Flomax) 0.4 mg capsule Take 0.4 mg by mouth daily.    Provider, Historical          Allergies   Allergen Reactions   ??? Latex Other (comments)     redness           Review of Systems   Constitutional: Positive for malaise/fatigue and weight loss.   Respiratory: Negative for cough and shortness of breath.    Cardiovascular: Negative for chest pain, palpitations and leg swelling.   Gastrointestinal: Positive for nausea. Negative for abdominal pain, constipation, diarrhea, heartburn and vomiting.   Skin: Negative for rash.   Neurological: Negative for dizziness and headaches.        Objective:   Physical Exam - thin, anxious, well groomed, good eye contact      Visit Vitals  BP 116/78   Pulse 76   Temp  97.5 ??F (36.4 ??C) (Temporal)   Resp 18   Ht 5\' 11"  (1.803 m)   Wt 156 lb (70.8 kg)   SpO2 95%   BMI 21.76 kg/m??         Disclaimer:  Advised him to call back or return to office if symptoms worsen/change/persist.  Discussed expected course/resolution/complications of diagnosis in detail with patient.    Medication risks/benefits/costs/interactions/alternatives discussed with patient.  He was given an after visit summary which includes diagnoses, current medications, & vitals.  He expressed understanding with the diagnosis and plan.      Aspects of this note may have been generated using voice recognition software. Despite editing, there may be some syntax errors.       Antony Haste, MD

## 2019-04-15 NOTE — Progress Notes (Signed)
Results released to patient via MyChart.  TSH high, only slightly improved, dose increased.

## 2019-04-15 NOTE — Progress Notes (Signed)
John Friedman is a 53 y.o. male who was seen in clinic today (04/15/2019).          Assessment & Plan:     Diagnoses and all orders for this visit are below.  I spent 15 minutes with the patient and >50% of the time was spent counseling him on differential dx and treatment options for issues below.    1. Nausea- this is a chronic problem but new to me, symptoms are: not changed, differential dx reviewed with the patient, exact etiology is unclear at this time.  Would favor due to HD as no obvious medication cause or medication changes.  Did review he should review medications w/ specialists.  Will continue w/ Zofran for now (prior to lunch and dinner).  Reviewed not a long term option.  Red flags were reviewed with the patient to RTC or notify me, expected time course for resolution reviewed.       2. Subclinical hypothyroidism- due for labs due to starting on medications, reviewed expectations  -     TSH 3RD GENERATION      Follow-up and Dispositions    ?? Return in about 6 weeks (around 05/27/2019) for Regular follow up.       Subjective:   [name] was seen today for Hypothyroidism and Stress        Since last visit: seen in ER for SOB/orthopnea (03/21/19).  Had bilateral pleural effusion (R>L).  No intervention.  He was called by [facility] the next day and told xray showed CHF.  He had a TTE on 01/26/19, we reviewed this.  EF: 55-60%.  Weight is down 25 lbs, he attributes to nausea and not being able to find something that he eats.  Nausea is constant.  No vomiting but some burping.  He is using zofran as needed with good relief but only temporary relief.  He has kidney bx set up for 9/8.      Endocrine Review  Patient is seen for followup of hypothyroidism.  Started on medication at last visit.  He reports medication compliance: compliant all of the time.  He reports the following concerns/problems/med side effects: none.  Lab review: labs reviewed and discussed with patient.          Prior to Admission medications    Medication Sig Start Date End Date Taking? Authorizing Provider   MULTIVITAMIN PO Take 1 Tab by mouth. rarely   Yes Provider, Historical   ondansetron hcl (ZOFRAN) 4 mg tablet Take 1 Tab by mouth every eight (8) hours as needed for Nausea or Vomiting. 03/04/19  Yes Antony Haste, MD   levothyroxine (SYNTHROID) 25 mcg tablet Take 1 Tab by mouth Daily (before breakfast). 02/22/19  Yes Antony Haste, MD   propranoloL (INDERAL) 20 mg tablet Take 1 Tab by mouth two (2) times a day. Indications: gad 02/12/19  Yes Epstein, Steffanie A, NP   FLUoxetine (PROzac) 20 mg capsule Take 1 Cap by mouth daily. Indications: repeated episodes of anxiety 02/12/19  Yes Epstein, Steffanie A, NP   clomiPRAMINE (ANAFRANIL) 50 mg capsule Take 150 mg by mouth nightly.   Yes Provider, Historical   clonazepam (KLONOPIN) 1 mg tablet Take 1 mg by mouth two (2) times a day.   Yes Provider, Historical   bethanechol chloride (URECHOLINE) 50 mg tablet Take 50 mg by mouth three (3) times daily. 04/03/19   Provider, Historical   pregabalin (LYRICA) 50 mg capsule Take 1 Cap by mouth nightly.  Max Daily Amount: 50 mg. For "sleep disturbances" - pt has had injuries from falling out of bed during sleep 01/29/19   Antony Haste, MD   tamsulosin (Flomax) 0.4 mg capsule Take 0.4 mg by mouth daily.    Provider, Historical          Allergies   Allergen Reactions   ??? Latex Other (comments)     redness           Review of Systems   Constitutional: Positive for malaise/fatigue and weight loss.   Respiratory: Negative for cough and shortness of breath.    Cardiovascular: Negative for chest pain, palpitations and leg swelling.   Gastrointestinal: Positive for nausea. Negative for abdominal pain, constipation, diarrhea, heartburn and vomiting.   Skin: Negative for rash.   Neurological: Negative for dizziness and headaches.        Objective:   Physical Exam - thin, anxious, well groomed, good eye contact       Visit Vitals  BP 116/78   Pulse 76   Temp 97.5 ??F (36.4 ??C) (Temporal)   Resp 18   Ht 5\' 11"  (1.803 m)   Wt 156 lb (70.8 kg)   SpO2 95%   BMI 21.76 kg/m??         Disclaimer:  Advised him to call back or return to office if symptoms worsen/change/persist.  Discussed expected course/resolution/complications of diagnosis in detail with patient.    Medication risks/benefits/costs/interactions/alternatives discussed with patient.  He was given an after visit summary which includes diagnoses, current medications, & vitals.  He expressed understanding with the diagnosis and plan.      Aspects of this note may have been generated using voice recognition software. Despite editing, there may be some syntax errors.       Antony Haste, MD

## 2019-04-17 ENCOUNTER — Encounter

## 2019-04-17 LAB — TSH 3RD GENERATION
TSH: 12.8 u[IU]/mL — ABNORMAL HIGH (ref 0.450–4.500)
TSH: 12.8 u[IU]/mL — ABNORMAL HIGH (ref 0.450–4.500)

## 2019-04-17 MED ORDER — LEVOTHYROXINE 75 MCG TAB
75 mcg | ORAL_TABLET | ORAL | 1 refills | Status: DC
Start: 2019-04-17 — End: 2019-04-26

## 2019-04-17 MED ORDER — LEVOTHYROXINE 25 MCG TAB
25 mcg | ORAL_TABLET | ORAL | 0 refills | Status: DC
Start: 2019-04-17 — End: 2019-04-17

## 2019-04-17 NOTE — Telephone Encounter (Signed)
Waiting on repeat labs.

## 2019-04-17 NOTE — Telephone Encounter (Signed)
Levothyroxine increased from 25mg  to 37mcg

## 2019-04-18 LAB — PT/INR EXTERNAL
INR, External: 1.1
Prothrombin time, External: 12.2

## 2019-04-18 LAB — AMB PT/INR EXTERNAL
INR, External: 1.1 NA
Prothrombin time, External: 12.2 NA

## 2019-04-23 ENCOUNTER — Encounter

## 2019-04-23 NOTE — Telephone Encounter (Signed)
sent in on 9/2

## 2019-04-26 ENCOUNTER — Encounter

## 2019-04-26 NOTE — Telephone Encounter (Signed)
[name] requesting 90 tablets

## 2019-04-27 MED ORDER — LEVOTHYROXINE 75 MCG TAB
75 mcg | ORAL_TABLET | ORAL | 0 refills | Status: DC
Start: 2019-04-27 — End: 2019-08-20

## 2019-05-13 ENCOUNTER — Encounter

## 2019-05-14 ENCOUNTER — Ambulatory Visit: Attending: Internal Medicine | Primary: Internal Medicine

## 2019-05-14 ENCOUNTER — Ambulatory Visit: Admit: 2019-05-14 | Payer: PRIVATE HEALTH INSURANCE | Attending: Internal Medicine | Primary: Internal Medicine

## 2019-05-14 DIAGNOSIS — N186 End stage renal disease: Secondary | ICD-10-CM

## 2019-05-14 NOTE — Progress Notes (Signed)
John Friedman is a 53 y.o. male who was seen in clinic today (05/14/2019).          Assessment & Plan:     Diagnoses and all orders for this visit.  I spent 15 minutes with the patient and >50% of the time was spent counseling him on different renal replacement options while being w/u for transplant and expectations to return to work.    1. ESRD (end stage renal disease) on dialysis John Friedman)- new dx, will change from ARF to ESRD.  Agree w/ his current plan.  Reviewed pros/cons from my standpoint for AVF/AVG vs PD.  Ultimately up to him.  He will d/w NP at HD regarding recommendations regarding nausea following HD.  Form will be completed.    2. Hypothyroidism due to acquired atrophy of thyroid- asymptomatic, dose recently increased, will check labs in 2-3 wks, no changes pending review of labs  -     TSH 3RD GENERATION      He will cancel f/u in 2 wks and reschedule for 3 month.    Follow-up and Dispositions    ?? Return in about 3 months (around 08/13/2019) for Regular follow up.       Subjective:   John Friedman was seen today for Form Completion      Since last visit: is s/p renal biopsy - he reports it was not sufficient and does not plan on redoing right now.  He did see [name] for initial evaluation.  He RTC to review paperwork to all him to RTW.    He wants to try going back to work.  He is still having some nausea.  It tends to be worse the morning after dialysis.  He is doing HD on M/W/F at 3:30-7:00.  He is also starting to think about transitioning to PD and/or getting a AVF/AVG to replace his Perm-a-cath.  Fatigue is stable.      Prior to Admission medications    Medication Sig Start Date End Date Taking? Authorizing Provider   levothyroxine (SYNTHROID) 75 mcg tablet TAKE 1 TABLET BY MOUTH EVERY DAY BEFORE BREAKFAST 04/26/19  Yes John Haste, MD   propranoloL (INDERAL) 20 mg tablet Take 1 Tab by mouth two (2) times a day. Indications: gad 02/12/19  Yes Epstein, Steffanie A, NP   FLUoxetine (PROzac) 20 mg  capsule Take 1 Cap by mouth daily. Indications: repeated episodes of anxiety 02/12/19  Yes Epstein, Steffanie A, NP   clomiPRAMINE (ANAFRANIL) 50 mg capsule Take 150 mg by mouth nightly.   Yes Provider, Historical   clonazepam (KLONOPIN) 1 mg tablet Take 1 mg by mouth two (2) times a day.   Yes Provider, Historical   bethanechol chloride (URECHOLINE) 50 mg tablet Take 50 mg by mouth three (3) times daily. 04/03/19   Provider, Historical   MULTIVITAMIN PO Take 1 Tab by mouth. rarely    Provider, Historical   ondansetron hcl (ZOFRAN) 4 mg tablet Take 1 Tab by mouth every eight (8) hours as needed for Nausea or Vomiting. 03/04/19   John Haste, MD   pregabalin (LYRICA) 50 mg capsule Take 1 Cap by mouth nightly. Max Daily Amount: 50 mg. For "sleep disturbances" - pt has had injuries from falling out of bed during sleep 01/29/19   John Haste, MD   tamsulosin (Flomax) 0.4 mg capsule Take 0.4 mg by mouth daily.    Provider, Historical          Allergies   Allergen  Reactions   ??? Latex Other (comments)     redness           ROS - per HPI    Objective:   Physical Exam - deferred      Visit Vitals  BP 112/70   Pulse 77   Temp 97.5 ??F (36.4 ??C) (Temporal)   Resp 22   Ht 5\' 11"  (1.803 m)   Wt 155 lb (70.3 kg)   SpO2 98%   BMI 21.62 kg/m??         Disclaimer:  Advised him to call back or return to office if symptoms worsen/change/persist.  Discussed expected course/resolution/complications of diagnosis in detail with patient.    Medication risks/benefits/costs/interactions/alternatives discussed with patient.  He was given an after visit summary which includes diagnoses, current medications, & vitals.  He expressed understanding with the diagnosis and plan.      Aspects of this note may have been generated using voice recognition software. Despite editing, there may be some syntax errors.       John Haste, MD

## 2019-05-14 NOTE — Progress Notes (Signed)
Results released to patient via MyChart.  TSH improved but still high.  Will increase dose to 2 tabs on Sunday.

## 2019-05-14 NOTE — Progress Notes (Signed)
Will need form completed to return to work.  Recently seen at [facility].

## 2019-05-14 NOTE — Progress Notes (Signed)
John Friedman is a 53 y.o. male who was seen in clinic today (05/14/2019).          Assessment & Plan:     Diagnoses and all orders for this visit.  I spent 15 minutes with the patient and >50% of the time was spent counseling him on different renal replacement options while being w/u for transplant and expectations to return to work.    1. ESRD (end stage renal disease) on dialysis [facility])- new dx, will change from ARF to ESRD.  Agree w/ his current plan.  Reviewed pros/cons from my standpoint for AVF/AVG vs PD.  Ultimately up to him.  He will d/w NP at HD regarding recommendations regarding nausea following HD.  Form will be completed.    2. Hypothyroidism due to acquired atrophy of thyroid- asymptomatic, dose recently increased, will check labs in 2-3 wks, no changes pending review of labs  -     TSH 3RD GENERATION      He will cancel f/u in 2 wks and reschedule for 3 month.    Follow-up and Dispositions    ?? Return in about 3 months (around 08/13/2019) for Regular follow up.       Subjective:   John Friedman was seen today for Form Completion      Since last visit: is s/p renal biopsy - he reports it was not sufficient and does not plan on redoing right now.  He did see [name] for initial evaluation.  He RTC to review paperwork to all him to RTW.    He wants to try going back to work.  He is still having some nausea.  It tends to be worse the morning after dialysis.  He is doing HD on M/W/F at 3:30-7:00.  He is also starting to think about transitioning to PD and/or getting a AVF/AVG to replace his Perm-a-cath.  Fatigue is stable.      Prior to Admission medications    Medication Sig Start Date End Date Taking? Authorizing Provider   levothyroxine (SYNTHROID) 75 mcg tablet TAKE 1 TABLET BY MOUTH EVERY DAY BEFORE BREAKFAST 04/26/19  Yes Antony Haste, MD   propranoloL (INDERAL) 20 mg tablet Take 1 Tab by mouth two (2) times a day. Indications: gad 02/12/19  Yes Epstein, Steffanie A, NP    FLUoxetine (PROzac) 20 mg capsule Take 1 Cap by mouth daily. Indications: repeated episodes of anxiety 02/12/19  Yes Epstein, Steffanie A, NP   clomiPRAMINE (ANAFRANIL) 50 mg capsule Take 150 mg by mouth nightly.   Yes Provider, Historical   clonazepam (KLONOPIN) 1 mg tablet Take 1 mg by mouth two (2) times a day.   Yes Provider, Historical   bethanechol chloride (URECHOLINE) 50 mg tablet Take 50 mg by mouth three (3) times daily. 04/03/19   Provider, Historical   MULTIVITAMIN PO Take 1 Tab by mouth. rarely    Provider, Historical   ondansetron hcl (ZOFRAN) 4 mg tablet Take 1 Tab by mouth every eight (8) hours as needed for Nausea or Vomiting. 03/04/19   Antony Haste, MD   pregabalin (LYRICA) 50 mg capsule Take 1 Cap by mouth nightly. Max Daily Amount: 50 mg. For "sleep disturbances" - pt has had injuries from falling out of bed during sleep 01/29/19   Antony Haste, MD   tamsulosin (Flomax) 0.4 mg capsule Take 0.4 mg by mouth daily.    Provider, Historical          Allergies   Allergen  Reactions   ??? Latex Other (comments)     redness           ROS - per HPI    Objective:   Physical Exam - deferred      Visit Vitals  BP 112/70   Pulse 77   Temp 97.5 ??F (36.4 ??C) (Temporal)   Resp 22   Ht 5\' 11"  (1.803 m)   Wt 155 lb (70.3 kg)   SpO2 98%   BMI 21.62 kg/m??         Disclaimer:  Advised him to call back or return to office if symptoms worsen/change/persist.  Discussed expected course/resolution/complications of diagnosis in detail with patient.    Medication risks/benefits/costs/interactions/alternatives discussed with patient.  He was given an after visit summary which includes diagnoses, current medications, & vitals.  He expressed understanding with the diagnosis and plan.      Aspects of this note may have been generated using voice recognition software. Despite editing, there may be some syntax errors.       Antony Haste, MD

## 2019-05-14 NOTE — Progress Notes (Signed)
Will need form completed to return to work.  Recently seen at [facility].

## 2019-05-16 NOTE — Telephone Encounter (Signed)
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  Patient states Dr. Amedeo Plenty has the information about where to send the note.

## 2019-05-20 NOTE — Telephone Encounter (Signed)
Forms completed.

## 2019-05-27 ENCOUNTER — Ambulatory Visit: Attending: Internal Medicine | Primary: Internal Medicine

## 2019-05-27 ENCOUNTER — Ambulatory Visit: Admit: 2019-05-27 | Payer: PRIVATE HEALTH INSURANCE | Attending: Internal Medicine | Primary: Internal Medicine

## 2019-05-27 DIAGNOSIS — E034 Atrophy of thyroid (acquired): Secondary | ICD-10-CM

## 2019-05-27 NOTE — Progress Notes (Signed)
John Friedman is a 52 y.o. male who was seen in clinic today (05/27/2019).          Assessment & Plan:   Diagnoses and all orders for this visit are below.  I spent 15 minutes with the patient and >50% of the time was spent counseling his on expectations with returning to work, that it is up to him and his supervisor to determine his exact schedule, and he will need to reassess based on fatigue and nausea.  He is due to repeat TSH, reviewed I'm not sure how much his sleep changes are due to thyroid replacement but will reassess after labs completed.     1. Hypothyroidism due to acquired atrophy of thyroid    2. ESRD (end stage renal disease) on dialysis [facility])      Follow-up and Dispositions    ?? Return if symptoms worsen or fail to improve.       Subjective:   John Friedman was seen today for Letter for School/Work and Hypothyroidism        Since last visit: he reports HR did not receive return to work form.  It was completed last week.    Patient is seen for followup of hypothyroidism.  He reports medication compliance: all the time and is taking separate from all other meds.  He reports the following concerns/problems/med side effects: he is noticing he is not sleeping as much on the weekends.  Previously was using sleep on the weekend to "recharge" his chronic fatigue.  Lab review: labs reviewed and discussed with patient.             Prior to Admission medications    Medication Sig Start Date End Date Taking? Authorizing Provider   levothyroxine (SYNTHROID) 75 mcg tablet TAKE 1 TABLET BY MOUTH EVERY DAY BEFORE BREAKFAST 04/26/19  Yes Antony Haste, MD   ondansetron hcl [facility]) 4 mg tablet Take 1 Tab by mouth every eight (8) hours as needed for Nausea or Vomiting. 03/04/19  Yes Antony Haste, MD   propranoloL (INDERAL) 20 mg tablet Take 1 Tab by mouth two (2) times a day. Indications: gad 02/12/19  Yes Epstein, Steffanie A, NP   FLUoxetine (PROzac) 20 mg capsule Take 1 Cap by mouth daily. Indications:  repeated episodes of anxiety 02/12/19  Yes Epstein, Steffanie A, NP   pregabalin (LYRICA) 50 mg capsule Take 1 Cap by mouth nightly. Max Daily Amount: 50 mg. For "sleep disturbances" - pt has had injuries from falling out of bed during sleep 01/29/19  Yes Antony Haste, MD   clomiPRAMINE (ANAFRANIL) 50 mg capsule Take 150 mg by mouth nightly.   Yes Provider, Historical   clonazepam (KLONOPIN) 1 mg tablet Take 1 mg by mouth two (2) times a day.   Yes Provider, Historical   bethanechol chloride (URECHOLINE) 50 mg tablet Take 50 mg by mouth three (3) times daily. 04/03/19   Provider, Historical   MULTIVITAMIN PO Take 1 Tab by mouth. rarely    Provider, Historical   tamsulosin (Flomax) 0.4 mg capsule Take 0.4 mg by mouth daily.    Provider, Historical          Allergies   Allergen Reactions   ??? Latex Other (comments)     redness           Review of Systems   Constitutional: Positive for malaise/fatigue.   Gastrointestinal: Positive for nausea.   Psychiatric/Behavioral: Negative for depression. The patient is not nervous/anxious and  does not have insomnia.         Objective:   Physical Exam - deferred       Visit Vitals  BP 112/80   Pulse 72   Temp 98 ??F (36.7 ??C) (Temporal)   Resp 16   Ht 5\' 11"  (1.803 m)   Wt 156 lb (70.8 kg)   SpO2 96%   BMI 21.76 kg/m??         Disclaimer:  Advised him to call back or return to office if symptoms worsen/change/persist.  Discussed expected course/resolution/complications of diagnosis in detail with patient.    Medication risks/benefits/costs/interactions/alternatives discussed with patient.  He was given an after visit summary which includes diagnoses, current medications, & vitals.  He expressed understanding with the diagnosis and plan.      Aspects of this note may have been generated using voice recognition software. Despite editing, there may be some syntax errors.       Antony Haste, MD

## 2019-05-27 NOTE — Progress Notes (Signed)
Asking about letter.  Follow up thyroid

## 2019-05-27 NOTE — Progress Notes (Signed)
John Friedman is a 53 y.o. male who was seen in clinic today (05/27/2019).          Assessment & Plan:   Diagnoses and all orders for this visit are below.  I spent 15 minutes with the patient and >50% of the time was spent counseling his on expectations with returning to work, that it is up to him and his supervisor to determine his exact schedule, and he will need to reassess based on fatigue and nausea.  He is due to repeat TSH, reviewed I'm not sure how much his sleep changes are due to thyroid replacement but will reassess after labs completed.     1. Hypothyroidism due to acquired atrophy of thyroid    2. ESRD (end stage renal disease) on dialysis [facility])      Follow-up and Dispositions    ?? Return if symptoms worsen or fail to improve.       Subjective:   John Friedman was seen today for Letter for School/Work and Hypothyroidism        Since last visit: he reports HR did not receive return to work form.  It was completed last week.    Patient is seen for followup of hypothyroidism.  He reports medication compliance: all the time and is taking separate from all other meds.  He reports the following concerns/problems/med side effects: he is noticing he is not sleeping as much on the weekends.  Previously was using sleep on the weekend to "recharge" his chronic fatigue.  Lab review: labs reviewed and discussed with patient.             Prior to Admission medications    Medication Sig Start Date End Date Taking? Authorizing Provider   levothyroxine (SYNTHROID) 75 mcg tablet TAKE 1 TABLET BY MOUTH EVERY DAY BEFORE BREAKFAST 04/26/19  Yes John Haste, MD   ondansetron hcl [facility]) 4 mg tablet Take 1 Tab by mouth every eight (8) hours as needed for Nausea or Vomiting. 03/04/19  Yes John Haste, MD   propranoloL (INDERAL) 20 mg tablet Take 1 Tab by mouth two (2) times a day. Indications: gad 02/12/19  Yes Epstein, Steffanie A, NP   FLUoxetine (PROzac) 20 mg capsule Take 1 Cap by mouth daily. Indications:  repeated episodes of anxiety 02/12/19  Yes Epstein, Steffanie A, NP   pregabalin (LYRICA) 50 mg capsule Take 1 Cap by mouth nightly. Max Daily Amount: 50 mg. For "sleep disturbances" - pt has had injuries from falling out of bed during sleep 01/29/19  Yes John Haste, MD   clomiPRAMINE (ANAFRANIL) 50 mg capsule Take 150 mg by mouth nightly.   Yes Provider, Historical   clonazepam (KLONOPIN) 1 mg tablet Take 1 mg by mouth two (2) times a day.   Yes Provider, Historical   bethanechol chloride (URECHOLINE) 50 mg tablet Take 50 mg by mouth three (3) times daily. 04/03/19   Provider, Historical   MULTIVITAMIN PO Take 1 Tab by mouth. rarely    Provider, Historical   tamsulosin (Flomax) 0.4 mg capsule Take 0.4 mg by mouth daily.    Provider, Historical          Allergies   Allergen Reactions   ??? Latex Other (comments)     redness           Review of Systems   Constitutional: Positive for malaise/fatigue.   Gastrointestinal: Positive for nausea.   Psychiatric/Behavioral: Negative for depression. The patient is not nervous/anxious and  does not have insomnia.         Objective:   Physical Exam - deferred       Visit Vitals  BP 112/80   Pulse 72   Temp 98 ??F (36.7 ??C) (Temporal)   Resp 16   Ht 5\' 11"  (1.803 m)   Wt 156 lb (70.8 kg)   SpO2 96%   BMI 21.76 kg/m??         Disclaimer:  Advised him to call back or return to office if symptoms worsen/change/persist.  Discussed expected course/resolution/complications of diagnosis in detail with patient.    Medication risks/benefits/costs/interactions/alternatives discussed with patient.  He was given an after visit summary which includes diagnoses, current medications, & vitals.  He expressed understanding with the diagnosis and plan.      Aspects of this note may have been generated using voice recognition software. Despite editing, there may be some syntax errors.       John Haste, MD

## 2019-05-28 LAB — TSH 3RD GENERATION
TSH: 6.92 u[IU]/mL — ABNORMAL HIGH (ref 0.450–4.500)
TSH: 6.92 u[IU]/mL — ABNORMAL HIGH (ref 0.450–4.500)

## 2019-05-30 NOTE — Telephone Encounter (Signed)
John Friedman, John Friedman (Self) [phone] (H)     Pt wants to be sure that dr Amedeo Plenty reads his my chart mess sent this morning and tries to take care of it today.

## 2019-05-30 NOTE — Telephone Encounter (Signed)
See MC message

## 2019-05-31 NOTE — Telephone Encounter (Signed)
Letter done.  MyChart message sent.  Please fax.

## 2019-05-31 NOTE — Telephone Encounter (Signed)
Letter faxed, confirmation received, patient notified.

## 2019-05-31 NOTE — Telephone Encounter (Signed)
RC to patient, identified with 2 identifiers.  He is asking for a letter as indicated from [employer] of 05/30/19.  Told him Dr. Amedeo Plenty was looking at it and would be done as soon as possible.

## 2019-05-31 NOTE — Telephone Encounter (Signed)
[phone]    The patient would like to discuss his  disability paperwork it has been filled out incorrectly

## 2019-08-20 ENCOUNTER — Encounter

## 2019-08-20 MED ORDER — LEVOTHYROXINE 75 MCG TAB
75 mcg | ORAL_TABLET | ORAL | 0 refills | Status: DC
Start: 2019-08-20 — End: 2019-11-14

## 2019-11-14 ENCOUNTER — Encounter

## 2019-11-15 MED ORDER — LEVOTHYROXINE 75 MCG TAB
75 mcg | ORAL_TABLET | ORAL | 0 refills | Status: DC
Start: 2019-11-15 — End: 2020-02-06

## 2020-02-06 ENCOUNTER — Encounter

## 2020-02-06 MED ORDER — LEVOTHYROXINE 75 MCG TAB
75 mcg | ORAL_TABLET | ORAL | 0 refills | Status: DC
Start: 2020-02-06 — End: 2020-04-13

## 2020-02-06 NOTE — Telephone Encounter (Signed)
See MyChart message, needs labs

## 2020-02-13 NOTE — Telephone Encounter (Signed)
Pt never read MyChart message below, please send letter

## 2020-03-25 NOTE — Progress Notes (Signed)
Please call patient.  TSH only slightly improved.  Not sure if he is still taking as should have run out of medication last month, need to verify.  If taking medication then need to increase to 42mcg daily, #90, RF 0.  Overdue for regular follow up.  Needs to schedule f/u for Sept/Oct.

## 2020-03-25 NOTE — Progress Notes (Signed)
Call to patient, identified with 2 identifiers.  Advised of Dr. Amedeo Plenty' note and recommendations.  Patient verbalized understanding. His appointment is actually 05/05/20.

## 2020-03-25 NOTE — Progress Notes (Signed)
Notified patient per provider result note. Patient taking 1 tab daily. He did not start the 1.5 tabs on Sunday. Will confirm with MD if should continue 1 tab daily except on Sunday take 1.5 tabs.

## 2020-03-25 NOTE — Progress Notes (Signed)
I'm not sure extra 1/2 tab will do the trick, but will go slowly.  Will increase by 1/2 tab per week and then will repeat labs at this f/u with me, should be in Oct. Still nothing scheduled.

## 2020-03-26 LAB — TSH 3RD GENERATION
TSH: 5.29 u[IU]/mL — ABNORMAL HIGH (ref 0.450–4.500)
TSH: 5.29 u[IU]/mL — ABNORMAL HIGH (ref 0.450–4.500)

## 2020-04-13 ENCOUNTER — Encounter

## 2020-04-13 NOTE — Telephone Encounter (Signed)
Overdue for labs & f/u.    Future Appointments   Date Time Provider [place]   05/05/2020  7:30 AM Antony Haste, MD [facility] BS AMB

## 2020-04-14 MED ORDER — LEVOTHYROXINE 75 MCG TAB
75 mcg | ORAL_TABLET | ORAL | 0 refills | Status: DC
Start: 2020-04-14 — End: 2020-04-15

## 2020-04-15 ENCOUNTER — Encounter

## 2020-04-15 MED ORDER — LEVOTHYROXINE 75 MCG TAB
75 mcg | ORAL_TABLET | ORAL | 0 refills | Status: DC
Start: 2020-04-15 — End: 2020-05-09

## 2020-04-15 NOTE — Telephone Encounter (Signed)
Patient states he needs another refill of his Levothyroxine sent to his pharmacy to last until his appt on 9/21.  The 10-day script that was sent in will not last until then.  He states he got his labs done.

## 2020-04-15 NOTE — Telephone Encounter (Signed)
Future Appointments   Date Time Provider [place]   05/05/2020  7:30 AM Antony Haste, MD [facility] BS AMB

## 2020-05-05 ENCOUNTER — Encounter: Attending: Internal Medicine | Primary: Internal Medicine

## 2020-05-05 ENCOUNTER — Ambulatory Visit: Attending: Internal Medicine | Primary: Internal Medicine

## 2020-05-08 ENCOUNTER — Encounter

## 2020-05-09 MED ORDER — LEVOTHYROXINE 75 MCG TAB
75 mcg | ORAL_TABLET | ORAL | 0 refills | Status: DC
Start: 2020-05-09 — End: 2020-07-06

## 2020-05-09 NOTE — Telephone Encounter (Signed)
No show to last appt.  30 days sent in.  Needs to keep f/u.    Future Appointments   Date Time Provider [place]   06/15/2020  7:45 AM Antony Haste, MD [facility] BS AMB

## 2020-05-11 ENCOUNTER — Encounter

## 2020-05-20 NOTE — Telephone Encounter (Signed)
Call to patient, identified with 2 identifiers.  Advised of Dr. Amedeo Plenty' note and recommendations.  Patient reports that the swelling is not at the graft site, that it is at his elbow.  He stated he showed the dialysis people the swelling at his elbow and was advised to see PCP.  He is scheduled with Dr. Kellie Simmering tomorrow and will keep that appointment for evaluation as well as his appointment with Dr. Amedeo Plenty on 06/02/20

## 2020-05-20 NOTE — Telephone Encounter (Signed)
dk

## 2020-05-20 NOTE — Telephone Encounter (Signed)
Not appropriate to see [place].  Needs to see renal.

## 2020-05-20 NOTE — Telephone Encounter (Signed)
Reason for call:  Patient states his dialysis arm has been swelling for over a week and is getting worse.  He originally scheduled with Dr. Madilyn Fireman for first available on the 19th but called this morning to see if he could get in earlier since it is continuing to swell.  Scheduled with Dr. Jilda Roche am, but told him I would send a message to Dr. Madilyn Fireman in case he wants him to do anything different.    Is this a new problem: yes     Date of last appointment:  05/27/2019     Can we respond via MyChart: no    Best call back number: [phone]

## 2020-05-21 ENCOUNTER — Ambulatory Visit: Attending: Internal Medicine | Primary: Internal Medicine

## 2020-05-21 ENCOUNTER — Ambulatory Visit
Admit: 2020-05-21 | Discharge: 2020-05-21 | Payer: PRIVATE HEALTH INSURANCE | Attending: Internal Medicine | Primary: Internal Medicine

## 2020-05-21 DIAGNOSIS — M7022 Olecranon bursitis, left elbow: Secondary | ICD-10-CM

## 2020-05-21 MED ORDER — PREDNISONE 5 MG TABLETS IN A DOSE PACK
5 mg | ORAL_TABLET | ORAL | 0 refills | Status: DC
Start: 2020-05-21 — End: 2020-06-02

## 2020-05-21 NOTE — Progress Notes (Signed)
Verified name and birth date for privacy precautions.   Chart reviewed in preparation for today's visit.     Chief Complaint   Patient presents with   . Elbow Swelling     left elbow swollen          Health Maintenance Due   Topic   . Shingrix Vaccine Age 54> (1 of 2)   . Flu Vaccine (1)         Wt Readings from Last 3 Encounters:   05/21/20 164 lb (74.4 kg)   05/27/19 156 lb (70.8 kg)   05/14/19 155 lb (70.3 kg)     Temp Readings from Last 3 Encounters:   05/21/20 97.3 F (36.3 C) (Temporal)   05/27/19 98 F (36.7 C) (Temporal)   05/14/19 97.5 F (36.4 C) (Temporal)     BP Readings from Last 3 Encounters:   05/21/20 101/61   05/27/19 112/80   05/14/19 112/70     Pulse Readings from Last 3 Encounters:   05/21/20 78   05/27/19 72   05/14/19 77         Learning Assessment:  :     Learning Assessment 04/09/2014 12/04/2012   PRIMARY LEARNER Patient Patient   HIGHEST LEVEL OF EDUCATION - PRIMARY LEARNER  - 4 YEARS OF COLLEGE   BARRIERS PRIMARY LEARNER - PHYSICAL   CO-LEARNER CAREGIVER - No   PRIMARY LANGUAGE ENGLISH ENGLISH   LEARNER PREFERENCE PRIMARY READING READING   ANSWERED BY patient Patient   RELATIONSHIP - SELF       Depression Screening:  :     3 most recent PHQ Screens 05/21/2020   Little interest or pleasure in doing things Not at all   Feeling down, depressed, irritable, or hopeless Not at all   Total Score PHQ 2 0       Fall Risk Assessment:  :     Fall Risk Assessment, last 12 mths 04/03/2018   Able to walk? Yes   Fall in past 12 months? No       Abuse Screening:  :     Abuse Screening Questionnaire 05/21/2020 04/03/2018   Do you ever feel afraid of your partner? N N   Are you in a relationship with someone who physically or mentally threatens you? N N   Is it safe for you to go home? Mal[place] Johns

## 2020-05-21 NOTE — Progress Notes (Signed)
Progress Notes by Thomes Lolling, MD at 05/21/20 0815                Author: Thomes Lolling, MD  Service: --  Author Type: Physician       Filed: 05/21/20 0916  Encounter Date: 05/21/2020  Status: Signed          Editor: Thomes Lolling, MD (Physician)                     Acute Care Note      John Friedman is 54 y.o.  male. he presents for evaluation of  Elbow Swelling (left elbow swollen)      The patient reports a swollen left elbow that has been present for the past week.  He experienced soreness at that area along with some localized swelling.  He denies trauma to the area but he does undergo dialysis for ESRD and reports that he is often  bearing weight on that elbow while he is sitting in the dialysis chair.  He has had no fevers or chills.  He has no pain or swelling at his dialysis graft.  He has been dialyzing well.                Prior to Admission medications             Medication  Sig  Start Date  End Date  Taking?  Authorizing Provider            sevelamer (RENAGEL) 400 mg tablet  Take  by mouth three (3) times daily (with meals).      Yes  Provider, Historical     levothyroxine (SYNTHROID) 75 mcg tablet  TAKE 1 TABLET BY MOUTH DAILY EXCEPT 1.5 TABS ON SUNDAY.  05/09/20    Yes  Antony Haste, MD     MULTIVITAMIN PO  Take 1 Tab by mouth. rarely      Yes  Provider, Historical     propranoloL (INDERAL) 20 mg tablet  Take 1 Tab by mouth two (2) times a day. Indications: gad  02/12/19    Yes  Epstein, Steffanie A, NP     FLUoxetine (PROzac) 20 mg capsule  Take 1 Cap by mouth daily. Indications: repeated episodes of anxiety  02/12/19    Yes  Epstein, Steffanie A, NP     clomiPRAMINE (ANAFRANIL) 50 mg capsule  Take 150 mg by mouth nightly.      Yes  Provider, Historical     clonazepam (KLONOPIN) 1 mg tablet  Take 1 mg by mouth two (2) times a day.      Yes  Provider, Historical     bethanechol chloride (URECHOLINE) 50 mg tablet  Take 50 mg by mouth three (3) times daily.  04/03/19       Provider, Historical     ondansetron hcl (ZOFRAN) 4 mg tablet  Take 1 Tab by mouth every eight (8) hours as needed for Nausea or Vomiting.   Patient not taking: Reported on 05/21/2020  03/04/19  05/21/20    Antony Haste, MD     pregabalin (LYRICA) 50 mg capsule  Take 1 Cap by mouth nightly. Max Daily Amount: 50 mg. For "sleep disturbances" - pt has had injuries from falling out of bed during sleep   Patient not taking: Reported on 05/21/2020  01/29/19  05/21/20    Antony Haste, MD  tamsulosin (Flomax) 0.4 mg capsule  Take 0.4 mg by mouth daily.   Patient not taking: Reported on 05/21/2020    05/21/20    Provider, Historical                Patient Active Problem List        Diagnosis  Code         ?  Hypothyroidism due to acquired atrophy of thyroid  E03.4     ?  H/O testicular cancer  Z85.47     ?  Sleep disturbances  G47.9     ?  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  Hyponatremia  E87.1     ?  Thrombocytopenia ([place])  D69.6     ?  Anemia  D64.9     ?  Pleural effusion, bilateral  J90     ?  History of diastolic dysfunction  Z61.09     ?  ESRD (end stage renal disease) on dialysis ([place])  N18.6, Z99.2         ?  SOB (shortness of breath)  R06.02              Review of Systems    Constitutional: Negative.     Respiratory: Negative.     Cardiovascular: Negative.     Gastrointestinal: Negative.     Musculoskeletal: Positive for joint pain.             Visit Vitals      BP  101/61 (BP 1 Location: Right arm, BP Patient Position: Sitting, BP Cuff Size: Adult)     Pulse  78     Temp  97.3 ??F (36.3 ??C) (Temporal)     Resp  16     Ht  5\' 11"  (1.803 m)     Wt  164 lb (74.4 kg)     SpO2  98%        BMI  22.87 kg/m??           Physical Exam    Musculoskeletal:          General: Swelling and tenderness  present.      Left elbow: Swelling and effusion  present.        Arms:         Comments: Present at the left elbow.                     ASSESSMENT/PLAN   Diagnoses and all orders for this visit:       1. Olecranon bursitis of left elbow - Due to dialysis and a contraindication for NSAIDs, we will begin prednisone for control of the pain/swelling.  Also given his relative immune compromise and  the duration of symptoms I advised he have an evaluation with orthopedics as a precaution.  Appointment obtained with Dr. Austin Miles on 05/25/20.  Information given to the patient.    -     predniSONE (STERAPRED) 5 mg dose pack; See administration instruction per 5mg  dose pack   -     REFERRAL TO ORTHOPEDIC SURGERY           Advised the patient to call back or return to office if symptoms worsen/change/persist.   Discussed expected course/resolution/complications of diagnosis in detail  with patient.     Medication risks/benefits/costs/interactions/alternatives discussed with patient.   The patient was given an after visit summary which includes diagnoses, current medications, & vitals.  They expressed understanding with  the diagnosis and plan.

## 2020-05-25 ENCOUNTER — Encounter

## 2020-05-25 NOTE — Telephone Encounter (Signed)
Requested Prescriptions     Pending Prescriptions Disp Refills   . levothyroxine (SYNTHROID) 75 mcg tablet 34 Tablet 0     Sig: TAKE 1 TABLET BY MOUTH DAILY EXCEPT 1.5 TABS ON SUNDAY.           Requested Quantity : 102.0      [id number] - [place], [place] - [address] [phone]

## 2020-06-02 ENCOUNTER — Ambulatory Visit: Attending: Internal Medicine | Primary: Internal Medicine

## 2020-06-02 ENCOUNTER — Ambulatory Visit
Admit: 2020-06-02 | Discharge: 2020-06-02 | Payer: PRIVATE HEALTH INSURANCE | Attending: Internal Medicine | Primary: Internal Medicine

## 2020-06-02 DIAGNOSIS — E034 Atrophy of thyroid (acquired): Secondary | ICD-10-CM

## 2020-06-02 NOTE — Assessment & Plan Note (Signed)
monitored by specialist. No acute findings meriting change in the plan

## 2020-06-02 NOTE — Progress Notes (Signed)
Follow up.  Going to [facility] next week to see urologist.

## 2020-06-02 NOTE — Assessment & Plan Note (Signed)
Asymptomatic, TSH is improved but still above goal, due for labs since dose increase.  Reviewed expectations if still above goal

## 2020-06-02 NOTE — Assessment & Plan Note (Signed)
Asymptomatic, stable

## 2020-06-02 NOTE — Assessment & Plan Note (Signed)
monitored by specialist. No acute findings meriting change in the plan, he is being worked up for renal transplant but needs urology evaluation to assess for bladder function first

## 2020-06-02 NOTE — Progress Notes (Signed)
Results released to patient via MyChart.  TSH is improved.  Still slightly higher then I would like, goal 2-3, but will defer any changes and repeat in 6 months.

## 2020-06-02 NOTE — Progress Notes (Signed)
John Friedman is Friedman 54 y.o. male who was seen in clinic today (06/02/2020).          Assessment & Plan:   Below is the assessment and plan developed based on review of pertinent history, physical exam, labs, studies, and medications.    1. Hypothyroidism due to acquired atrophy of thyroid  Assessment & Plan:  Asymptomatic, TSH is improved but still above goal, due for labs since dose increase.  Reviewed expectations if still above goal   Orders:  -     TSH 3RD GENERATION  -     T4, FREE  2. Olecranon bursitis of left elbow  Comments:  recurrent, would not aspirate again today, reviewed ice, rest, stretching, and using Friedman cushion. Red flags reviewed. Discussed when to see ortho or to aspirate  3. ESRD (end stage renal disease) on dialysis [name])  Assessment & Plan:   monitored by specialist. No acute findings meriting change in the plan, he is being worked up for renal transplant but needs urology evaluation to assess for bladder function first  4. Thrombocytopenia ([facility])  Assessment & Plan:  Asymptomatic, stable   5. Chronic fatigue  Assessment & Plan:   monitored by specialist. No acute findings meriting change in the plan    Follow-up and Dispositions    ?? Return if symptoms worsen or fail to improve.       Subjective/Objective:   John Friedman was seen today for Hypothyroidism and Elbow Swelling    Since last visit: being worked up for transplant, needs to see urology to assess bladder function.  He also saw Dr John Friedman earlier this month for L elbow bursitis.  He reports it improved but returned Friedman few days later.  No pain.  No trauma. He is leaning on it during driving & HD.      Endocrine Review  Patient is seen for followup of hypothyroidism.  He reports medication compliance: all the time and is taking separate from all other meds.  He reports the following concerns/problems/med side effects: none.           Prior to Admission medications    Medication Sig Start Date End Date Taking? Authorizing Provider    armodafiniL 200 mg tab Take 1 Tablet by mouth daily as needed. 03/24/20  Yes Provider, Historical   sevelamer (RENAGEL) 400 mg tablet Take  by mouth three (3) times daily (with meals).   Yes Provider, Historical   levothyroxine (SYNTHROID) 75 mcg tablet TAKE 1 TABLET BY MOUTH DAILY EXCEPT 1.5 TABS ON SUNDAY. 05/09/20  Yes John Haste, MD   propranoloL (INDERAL) 20 mg tablet Take 1 Tab by mouth two (2) times Friedman day. Indications: gad 02/12/19  Yes John Friedman, John A, NP   FLUoxetine (PROzac) 20 mg capsule Take 1 Cap by mouth daily. Indications: repeated episodes of anxiety 02/12/19  Yes John Friedman, John A, NP   clomiPRAMINE (ANAFRANIL) 50 mg capsule Take 150 mg by mouth nightly.   Yes Provider, Historical   clonazepam (KLONOPIN) 1 mg tablet Take 1 mg by mouth two (2) times Friedman day.   Yes Provider, Historical   predniSONE (STERAPRED) 5 mg dose pack See administration instruction per 5mg  dose pack  Patient not taking: Reported on 06/02/2020 05/21/20 06/02/20  Thomes Lolling, MD   bethanechol chloride (URECHOLINE) 50 mg tablet Take 50 mg by mouth three (3) times daily.  Patient not taking: Reported on 06/02/2020 04/03/19   Provider, Historical   MULTIVITAMIN PO Take 1  Tab by mouth. rarely  Patient not taking: Reported on 06/02/2020    Provider, Historical          Physical Exam  Neck:      Thyroid: No thyromegaly or thyroid tenderness.   Musculoskeletal:      Left elbow: Deformity (enlarged bursa, not tender, soft) present. No effusion. Normal range of motion.   Neurological:      Comments: Shaking/tremor of bilateral upper arms       Visit Vitals  BP 102/60   Pulse 78   Temp 98 ??F (36.7 ??C) (Temporal)   Resp 16   Ht 5\' 11"  (1.803 m)   Wt 164 lb (74.4 kg)   SpO2 98%   BMI 22.87 kg/m??       John Haste, MD

## 2020-06-03 LAB — TSH 3RD GENERATION
TSH: 4.18 u[IU]/mL (ref 0.450–4.500)
TSH: 4.18 u[IU]/mL (ref 0.450–4.500)

## 2020-06-03 LAB — T4, FREE
T4 Free: 0.94 ng/dL (ref 0.82–1.77)
T4, Free: 0.94 ng/dL (ref 0.82–1.77)

## 2020-06-15 ENCOUNTER — Encounter: Payer: PRIVATE HEALTH INSURANCE | Attending: Internal Medicine | Primary: Internal Medicine

## 2020-07-04 ENCOUNTER — Encounter

## 2020-07-06 MED ORDER — LEVOTHYROXINE 75 MCG TAB
75 mcg | ORAL_TABLET | ORAL | 1 refills | Status: DC
Start: 2020-07-06 — End: 2021-01-14

## 2020-11-01 ENCOUNTER — Inpatient Hospital Stay
Admit: 2020-11-01 | Discharge: 2020-11-01 | Disposition: A | Discharge status: Home / Self Care | Payer: PRIVATE HEALTH INSURANCE | Attending: Emergency Medicine

## 2020-11-01 DIAGNOSIS — Z466 Encounter for fitting and adjustment of urinary device: Secondary | ICD-10-CM

## 2020-11-01 NOTE — ED Notes (Signed)
Triage: pt went down to [place] for a TURP and had a Foley catheter placed on Wednesday. Pt here for removal of catheter. Pt stated he sees Dr. Garnette Czech here in [place]. +hematuria. Denies fever, abd/back pain, N/V/D.

## 2020-11-01 NOTE — ED Notes (Signed)
 Pt ambulatory out of ED with discharge instructions in hand given by Dr. Dudley Major; pt verbalized understanding of discharge paperwork and time allotted for questions. VSS. Pt alert and oriented.

## 2020-11-01 NOTE — ED Notes (Signed)
Foley removed.

## 2020-11-01 NOTE — ED Provider Notes (Signed)
The history is provided by the patient.   Urinary Catheter Problem   This is a new problem. The current episode started more than 2 days ago. The problem has not changed since onset.There has been no fever. Associated symptoms include hematuria. Pertinent negatives include no chills, no sweats, no nausea, no vomiting, no discharge, no frequency, no hesitancy, no urgency, no flank pain, no penile discharge, no abdominal pain and no back pain. He has tried nothing for the symptoms. The treatment provided no relief. His past medical history is significant for urinary catheter problem.        Past Medical History:   Diagnosis Date   ??? Anemia 01/21/2019   ??? ARF (acute renal failure) (HCC)     on HD   ??? BPH (benign prostatic hyperplasia)    ??? Cancer ([place])     left testicle- stage III, nonseminomatous germ cell tumor, s/p orchiectomy & BEP x 3 cycles   ??? Chronic fatigue January 1987   ??? Chronic kidney disease     renal failure, HD   ??? Essential tremor    ??? GERD (gastroesophageal reflux disease)    ??? 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 Hyponatremia 01/21/2019   ??? Hypothyroid    ??? 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 Lumbar radiculopathy 08/10/2012    S/p steroid injection by Dr Laverta Erath 08/01/12    ??? Normal cardiac stress test 09/05/11   ??? Pleural effusion, bilateral 01/21/2019   ??? Pulmonary edema 01/21/2019   ??? Sleep disturbances     acting out in his sleep   ??? Thrombocytopenia ([place]) 01/21/2019       Past Surgical History:   Procedure Laterality Date   ??? HX ARTERIOVENOUS FISTULA Left 2021    left AVF   ??? HX CHOLECYSTECTOMY  12/05/2011    abnormal HIDA scan (EF < 2%)   ??? HX COLONOSCOPY  07/12/10    hyperplastic polyp, repeat 10 yrs   ??? HX COLONOSCOPY  02/23/15    normal   ??? HX CYST REMOVAL Right 01/06/2016    3rd toe ganglion cyst removal - podiatry (Dr Raliegh Ip)   ??? HX GI  09/09/11    EGD- normal   ??? HX HEENT Left 06/2012    tonsilar bx: benign tissue (Dr Buddy Duty)   ??? HX ORCHIECTOMY Left 2002   ??? 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 HX SHOULDER ARTHROSCOPY Right 08/13/2018   ??? HX TURP           Family History:   Problem Relation Age of Onset   ??? No Known Problems Mother    ??? Parkinsonism Father    ??? OSTEOARTHRITIS Father         scoliosis   ??? Dementia Father    ??? No Known Problems Brother    ??? No Known Problems Brother    ??? No Known Problems Sister    ??? Cancer Maternal Grandfather         prostate   ??? Cancer Paternal Grandfather         prostate       Social History     Socioeconomic History   ??? Marital status: SINGLE     Spouse name: Not on file   ??? Number of children: Not on file   ??? Years of education: Not on file   ??? Highest education  level: Not on file   Occupational History   ??? Not on file   Tobacco Use   ??? Smoking status: Never Smoker   ??? Smokeless tobacco: Never Used   Vaping Use   ??? Vaping Use: Never used   Substance and Sexual Activity   ??? Alcohol use: Yes     Comment: rarely   ??? Drug use: No   ??? Sexual activity: Not Currently     Partners: Female   Other Topics Concern   ??? Not on file   Social History Narrative   ??? Not on file     Social Determinants of Health     Financial Resource Strain:    ??? Difficulty of Paying Living Expenses: Not on file   Food Insecurity:    ??? Worried About Running Out of Food in the Last Year: Not on file   ??? Ran Out of Food in the Last Year: Not on file   Transportation Needs:    ??? Lack of Transportation (Medical): Not on file   ??? Lack of Transportation (Non-Medical): Not on file   Physical Activity:    ??? Days of Exercise per Week: Not on file   ??? Minutes of Exercise per Session: Not on file   Stress:    ??? Feeling of Stress : Not on file   Social Connections:    ??? Frequency of Communication with Friends and Family: Not on file   ??? Frequency of Social Gatherings with Friends and Family: Not on file   ??? Attends Religious Services: Not on file   ??? Active Member of Clubs or Organizations: Not on file   ??? Attends Archivist Meetings: Not on file   ??? Marital  Status: Not on file   Intimate Partner Violence:    ??? Fear of Current or Ex-Partner: Not on file   ??? Emotionally Abused: Not on file   ??? Physically Abused: Not on file   ??? Sexually Abused: Not on file   Housing Stability:    ??? Unable to Pay for Housing in the Last Year: Not on file   ??? Number of Places Lived in the Last Year: Not on file   ??? Unstable Housing in the Last Year: Not on file         ALLERGIES: Latex    Review of Systems   Constitutional: Negative for activity change, chills and fever.   HENT: Negative for nosebleeds, sore throat, trouble swallowing and voice change.    Eyes: Negative for visual disturbance.   Respiratory: Negative for shortness of breath.    Cardiovascular: Negative for chest pain and palpitations.   Gastrointestinal: Negative for abdominal pain, constipation, diarrhea, nausea and vomiting.   Genitourinary: Positive for hematuria. Negative for difficulty urinating, dysuria, flank pain, frequency, hesitancy, penile discharge and urgency.   Musculoskeletal: Negative for back pain, neck pain and neck stiffness.   Skin: Negative for color change.   Allergic/Immunologic: Negative for immunocompromised state.   Neurological: Negative for dizziness, seizures, syncope, weakness, light-headedness, numbness and headaches.   Psychiatric/Behavioral: Negative for behavioral problems, confusion, hallucinations, self-injury and suicidal ideas.       Vitals:    11/01/20 1039   BP: (!) 123/90   Pulse: 74   Resp: 18   Temp: 98.7 ??F (37.1 ??C)   SpO2: 100%   Weight: 76.8 kg (169 lb 5 oz)   Height: 5\' 11"  (1.803 m)  Physical Exam  Vitals and nursing note reviewed.   Constitutional:       General: He is not in acute distress.     Appearance: He is well-developed. He is not diaphoretic.   HENT:      Head: Atraumatic.   Neck:      Trachea: No tracheal deviation.   Cardiovascular:      Comments: Warm and well perfused  Pulmonary:      Effort: Pulmonary effort is normal. No respiratory distress.    Musculoskeletal:         General: Normal range of motion.   Skin:     General: Skin is warm and dry.   Neurological:      Mental Status: He is alert.      Coordination: Coordination normal.   Psychiatric:         Behavior: Behavior normal.         Thought Content: Thought content normal.         Judgment: Judgment normal.          MDM     This is a 55 year old male with past medical history, review of systems, physical exam as above, presenting with complaints of Foley catheter removal.  Patient states he had a revised TURP performed 4 days ago by his urologist in [place].  He was instructed to present "somewhere" for Foley removal today.  Patient states he does not follow with a urologist locally.  He went to urgent care was referred to the emergency department.  He endorses ongoing hematuria, denies other complaints.  I discussed with the patient removal of Foley catheter in the emergency department especially given his report of ongoing hematuria.  Patient is insistent.  Will instruct nursing to remove his Foley catheter and discharge him to follow with his urologist in [facility] as well as his primary care physician locally.  Return precautions given.    Procedures

## 2020-12-07 NOTE — Other (Signed)
[facility] Team [facility]  [phone]    Vitals   Pre   Post   Assessment   Pre   Post     Temp  98  97.8 LOC  Alert and oriented x4 Alert and oriented x4   HR   Pulse (Heart Rate): 82 (01/21/19 1945) 80 Lungs   coarse with 3lnc   coarse on 3lnc   B/P   BP: 122/82 (01/21/19 1945) 137/69 Cardiac   B/p wnl,   NSR  b/p wnl   Resp   Resp Rate: 14 (01/21/19 1945) 20 Skin   intact  intact   Pain level  0 0 Edema  Trace in all ext's     Trace in all ext's   Orders:    Duration:   Start:    1955 End:    2255 Total:   3hrs   Dialyzer:   Dialyzer/Set Up Inspection: Revaclear (01/21/19 1945)   K Bath:   Dialysate K (mEq/L): 3 (01/21/19 1945)   Ca Bath:   Dialysate CA (mEq/L): 2.5 (01/21/19 1945)   Na/Bicarb:   Dialysate NA (mEq/L): 140 (01/21/19 1945)   Target Fluid Removal:   Goal/Amount of Fluid to Remove (mL): 3000 mL (01/21/19 1945)   Access     Type & Location:   Rt subclavian perm-cath. Dressing changed, no s/s of infection noted. Each catheter limb disinfected for 60 seconds per limb with alcohol swabs. Caps removed, dialysis CVC hub scrubbed with Prevantics for 5 seconds, followed by a 5 second dry time per Hospital P&P. Blood aspirated and lines flushed without any issues.       Labs     Obtained/Reviewed   Critical Results Called   Date when labs were drawn-  Hgb-    HGB   Date Value Ref Range Status   01/21/2019 7.6 (L) 12.1 - 17.0 g/dL Final     K-    Potassium   Date Value Ref Range Status   01/21/2019 3.9 3.5 - 5.1 mmol/L Final     Ca-   Calcium   Date Value Ref Range Status   01/21/2019 7.5 (L) 8.5 - 10.1 MG/DL Final     Bun-   BUN   Date Value Ref Range Status   01/21/2019 48 (H) 6 - 20 MG/DL Final     Creat-   Creatinine   Date Value Ref Range Status   01/21/2019 9.46 (H) 0.70 - 1.30 MG/DL Final        Medications/ Blood Products Given     Name   Dose   Route and Time     heparin 3600units 1800 units in each port   Retacrit 20000units Given at 2225        Blood Volume Processed (BVP):     64.8 Net Fluid   Removed:  3kg   Comments   Time Out Done: 6967  Primary Nurse Rpt ELF:YBOF Kaur Rn  Primary Nurse Rpt Post: Doran Clay RN  Pt Ray Plan:ESRD  Tx Summary:  1956 dialysis started  2252 Dialysis completed and blood rinsed back. Heparin lock placed in each port with new caps. Pt stable  Admiting Diagnosis:PNA  Pt's previous clinic-[facility]  Consent signed - Informed Consent Verified: Yes (01/21/19 1945)  [facility] Consent - obtained  Hepatitis Status- NEG AG 01/12/19  Machine #- Machine Number: [id number] (01/21/19 1945)  Telemetry status-NSR  Pre-dialysis wt.-

## 2021-01-14 ENCOUNTER — Encounter

## 2021-01-14 NOTE — Telephone Encounter (Signed)
No future appointments.

## 2021-01-15 MED ORDER — LEVOTHYROXINE 75 MCG TAB
75 mcg | ORAL_TABLET | ORAL | 0 refills | Status: DC
Start: 2021-01-15 — End: 2021-07-04

## 2021-02-23 NOTE — Telephone Encounter (Signed)
Pt. Called through triage line complains of hypotension after dialysis standing BP 70/40, has to wait awhile to come up to 100/80, states he feels weaker, gets dizzy, has appts. With both [facility] and [name] to eval. If he qualifies for kidney transplant. Instructed to call his renal Dr Iona Beard with his concerns as Dr Amedeo Plenty does not adjust dialysis orders. I'll also ask when he needs to be seen here or if ok to wait until after his work up appts.

## 2021-02-23 NOTE — Telephone Encounter (Signed)
Last seen in Oct '21.  Due for f/u in Oct '22, nothing scheduled.  He needs to address this with his nephrologist.

## 2021-02-23 NOTE — Telephone Encounter (Signed)
Left detailed note to discuss BP concerns with his nephrologist and to schedule his appt. In Oct. With Dr Amedeo Plenty.

## 2021-07-03 ENCOUNTER — Encounter

## 2021-07-04 NOTE — Telephone Encounter (Signed)
Overdue for f/u and labs.  Not sure if I am even still his PCP.  Appointment required prior to any further refills

## 2021-07-05 MED ORDER — LEVOTHYROXINE 75 MCG TAB
75 mcg | ORAL_TABLET | ORAL | 0 refills | Status: DC
Start: 2021-07-05 — End: 2021-10-18

## 2021-07-05 NOTE — Telephone Encounter (Signed)
Scheduled appt for 07/30/21

## 2021-07-30 ENCOUNTER — Ambulatory Visit
Admit: 2021-07-30 | Discharge: 2021-07-30 | Payer: PRIVATE HEALTH INSURANCE | Attending: Internal Medicine | Primary: Internal Medicine

## 2021-07-30 DIAGNOSIS — Z0000 Encounter for general adult medical examination without abnormal findings: Secondary | ICD-10-CM

## 2021-07-30 LAB — LIPID PANEL
CHOL/HDL Ratio: 2.8 (ref 0.0–5.0)
Chol/HDL Ratio: 2.8 (ref 0.0–5.0)
Cholesterol, Total: 181 MG/DL (ref <200)
Cholesterol, total: 181 MG/DL (ref <200)
HDL Cholesterol: 65 MG/DL
HDL: 65 MG/DL
LDL Calculated: 89.4 MG/DL (ref 0–100)
LDL, calculated: 89.4 MG/DL (ref 0–100)
Triglyceride: 133 MG/DL (ref <150)
Triglycerides: 133 MG/DL (ref <150)
VLDL Cholesterol Calculated: 26.6 MG/DL
VLDL, calculated: 26.6 MG/DL

## 2021-07-30 LAB — TSH 3RD GENERATION
TSH: 2.84 u[IU]/mL (ref 0.36–3.74)
TSH: 2.84 u[IU]/mL (ref 0.36–3.74)

## 2021-07-30 NOTE — Progress Notes (Signed)
John Friedman  is a 55 y.o.  male  who present for routine immunizations. Prior to vaccine administration: Consent was obtained. Risks and adverse reactions were discussed. The patient was provided the VIS and they were given an opportunity to ask questions; all questions were addressed. He  denies any symptoms, reactions or allergies that would exclude them from being immunized today.   There were no adverse reactions observed post vaccination.  Patient was advised to seek medical or call the office with any questions or concerns post vaccination. Patient verbalized understanding.  Janice Coffin

## 2021-07-30 NOTE — Progress Notes (Signed)
Results released to patient via MyChart.  All labs are stable or at goal for him.  The 10-year ASCVD risk score (Arnett DK, et al., 2019) is: 3.4%

## 2021-07-30 NOTE — Assessment & Plan Note (Signed)
Stable, monitored by specialist. No acute findings meriting change in the plan

## 2021-07-30 NOTE — ACP (Advance Care Planning) (Signed)
Advance Care Planning  Advance Care Planning (ACP) Physician/NP/PA (Provider) Conversation    Date of ACP Conversation: 07/30/21  Persons included in Conversation:  patient  Length of ACP Conversation in minutes: <16 minutes (Non-Billable)    Authorized Media planner (if patient is incapable of making informed decisions):   Next of Kin by law (only applies in absence of a Water engineer Guardian)      Primary Decision Maker: Zada Girt - Parent - [phone]    Secondary Decision Maker: Kandace Parkins (biological mother) - Other Non-relative - [phone]    Supplemental (Other) Decision Maker: [name], Eckard - Sister [phone]    He has NO advanced directive - recommended completing forms.  Reviewed DNR/DNI and patient is not interested- elects Full Code (attempt resuscitation).       Antony Haste, MD

## 2021-07-30 NOTE — Progress Notes (Signed)
John Friedman is a 55 y.o. male who was seen in clinic today (07/30/2021) for a full physical.       Assessment & Plan:   Below is the assessment and plan developed based on review of pertinent history, physical exam, labs, studies, and medications.    1. Routine physical examination  Comments:  available Care Everywhere labs reviewed  Orders:  -     TSH 3RD GENERATION; Future  -     LIPID PANEL; Future  2. Advanced care planning/counseling discussion  3. Encounter for immunization  -     TDAP, BOOSTRIX, (AGE 18 YRS+), IM  4. Hypothyroidism due to acquired atrophy of thyroid  Assessment & Plan:  well controlled, continue current treatment pending review of labs    Orders:  -     TSH 3RD GENERATION; Future  5. ESRD (end stage renal disease) on dialysis [facility])  Assessment & Plan:   monitored by specialist. No acute findings meriting change in the plan.  Is s/p TURP and he reports cleared him for renal transplant.  On the list at [facility]  6. Essential tremor  Assessment & Plan:  Improved on the R side s/p procedure.  Still having issues on L side but f/u procedure is not  FDA approved at this time   7. Thrombocytopenia ([place])  Assessment & Plan:  Stable, asymptomatic, outside labs reviewed   8. Generalized anxiety disorder  Assessment & Plan:  Stable, monitored by specialist. No acute findings meriting change in the plan      Follow-up and Dispositions    Return in about 1 year (around 07/30/2022) for FULL PHYSICAL.         Subjective:   John Friedman is here today for a full physical.      Since last visit: s/p TURP in March and [place] focused thalamotomy for ET.    End of Life Planning  This was discussed with him today and he has NO advanced directive  - add't info provided.  Reviewed DNR/DNI and patient is not interested.    Health Maintenance  Immunizations:   Covid: up to date   Influenza: up to date   Tetanus: not UTD- will do today   Shingles: n/a - he reports never had chicken pox   Pneumonia: up to date   Cancer  screening:     Prostate: guidelines reviewed, UTD- monitored by urologist   Colon: guidelines reviewed, up to date      The following acute and/or chronic problems were addressed today:    Endocrine Review  Patient is seen for followup of hypothyroidism.  He reports medication compliance: all the time and is taking separate from all other meds.  He reports the following concerns/problems/med side effects: none.       The following sections were reviewed & updated as appropriate: Problem List, Allergies, PMH, PSH, FH, and SH.    Prior to Admission medications    Medication Sig Start Date End Date Taking? Authorizing Provider   levothyroxine (SYNTHROID) 75 mcg tablet TAKE 1 TABLET BY MOUTH DAILY EXCEPT TAKE 1 & 1/2 TABLETS ON SUNDAYS.  Appointment required prior to any further refills 07/04/21  Yes Antony Haste, MD   propranoloL (INDERAL) 20 mg tablet Take 1 Tab by mouth two (2) times a day. Indications: gad 02/12/19  Yes Epstein, Steffanie A, NP   FLUoxetine (PROzac) 20 mg capsule Take 1 Cap by mouth daily. Indications: repeated episodes of anxiety 02/12/19  Yes Thamas Jaegers  A, NP   clomiPRAMINE (ANAFRANIL) 50 mg capsule Take 150 mg by mouth nightly.   Yes Provider, Historical   clonazePAM (KlonoPIN) 1 mg tablet Take 1 mg by mouth two (2) times a day.   Yes Provider, Historical   armodafiniL 200 mg tab Take 1 Tablet by mouth daily as needed. 03/24/20   Provider, Historical   sevelamer (RENAGEL) 400 mg tablet Take  by mouth three (3) times daily (with meals).  Patient not taking: Reported on 07/30/2021    Provider, Historical   MULTIVITAMIN PO Take 1 Tablet by mouth. rarely  07/30/21  Provider, Historical          Review of Systems   Constitutional:  Negative for chills and fever.   Respiratory:  Negative for cough and shortness of breath.    Cardiovascular:  Negative for chest pain and palpitations.   Gastrointestinal:  Negative for abdominal pain, blood in stool, constipation, diarrhea, heartburn, nausea  and vomiting.   Musculoskeletal:  Negative for joint pain and myalgias.   Neurological:  Positive for tremors. Negative for tingling, focal weakness and headaches.   Endo/Heme/Allergies:  Does not bruise/bleed easily.   Psychiatric/Behavioral:  Negative for depression. The patient is not nervous/anxious and does not have insomnia.      Objective:   Physical Exam  Constitutional:       General: He is not in acute distress.     Appearance: He is well-developed.   HENT:      Right Ear: Tympanic membrane and ear canal normal.      Left Ear: Tympanic membrane and ear canal normal.   Eyes:      Conjunctiva/sclera: Conjunctivae normal.   Cardiovascular:      Rate and Rhythm: Regular rhythm.      Heart sounds: Normal heart sounds. No murmur heard.  Pulmonary:      Effort: Pulmonary effort is normal.      Breath sounds: Normal breath sounds.   Abdominal:      General: Bowel sounds are normal.      Palpations: Abdomen is soft. There is no hepatomegaly or splenomegaly.      Tenderness: There is no abdominal tenderness.   Musculoskeletal:      Right lower leg: No edema.      Left lower leg: No edema.   Lymphadenopathy:      Cervical: No cervical adenopathy.   Psychiatric:         Mood and Affect: Mood and affect normal.        Visit Vitals  BP 104/70   Pulse 87   Temp 97.9 ??F (36.6 ??C) (Temporal)   Resp 18   Ht 5\' 11"  (1.803 m)   Wt 172 lb (78 kg)   SpO2 98%   BMI 23.99 kg/m??         Antony Haste, MD

## 2021-07-30 NOTE — Assessment & Plan Note (Signed)
monitored by specialist. No acute findings meriting change in the plan.  Is s/p TURP and he reports cleared him for renal transplant.  On the list at [facility]

## 2021-07-30 NOTE — Assessment & Plan Note (Signed)
well controlled, continue current treatment pending review of labs

## 2021-07-30 NOTE — Assessment & Plan Note (Signed)
Improved on the R side s/p procedure.  Still having issues on L side but f/u procedure is not  FDA approved at this time

## 2021-07-30 NOTE — Assessment & Plan Note (Signed)
Stable, asymptomatic, outside labs reviewed

## 2021-10-18 ENCOUNTER — Encounter

## 2021-10-18 MED ORDER — LEVOTHYROXINE 75 MCG TAB
75 mcg | ORAL_TABLET | ORAL | 1 refills | Status: AC
Start: 2021-10-18 — End: ?

## 2022-02-04 ENCOUNTER — Inpatient Hospital Stay: Admit: 2022-02-04 | Primary: Internal Medicine

## 2022-02-04 LAB — HEPATITIS B SURFACE ANTIBODY
Hep B S Ab: 5.46 m[IU]/mL
Interpretation: NONREACTIVE

## 2022-02-05 LAB — HEPATITIS B SURFACE ANTIGEN
Hepatitis B Surface Ag: <0.1 Index
Interpretation: NEGATIVE

## 2022-03-02 LAB — CREATININE, EXTERNAL: Creatinine, External: 5.71

## 2022-03-08 LAB — CREATININE, EXTERNAL: Creatinine, External: 5.08

## 2022-04-15 ENCOUNTER — Ambulatory Visit: Admit: 2022-04-15 | Discharge: 2022-04-15 | Payer: MEDICARE | Attending: Internal Medicine | Primary: Internal Medicine

## 2022-04-15 ENCOUNTER — Encounter

## 2022-04-15 DIAGNOSIS — I953 Hypotension of hemodialysis: Secondary | ICD-10-CM

## 2022-04-15 DIAGNOSIS — G250 Essential tremor: Secondary | ICD-10-CM

## 2022-04-15 MED ORDER — LEVOTHYROXINE SODIUM 75 MCG PO TABS
75 MCG | ORAL_TABLET | ORAL | 0 refills | Status: DC
Start: 2022-04-15 — End: 2022-07-24

## 2022-04-15 NOTE — Assessment & Plan Note (Signed)
Worsening, multi-factorial (decreasing meds vs being taken off transplant list).  Monitored by specialist- no acute findings meriting change in the plan.

## 2022-04-15 NOTE — Assessment & Plan Note (Signed)
Off transplant list currently, working to get back on this. Monitored by specialist- no acute findings meriting change in the plan

## 2022-04-15 NOTE — Progress Notes (Signed)
John Friedman is a 56 y.o. male who was seen in clinic today (04/15/2022).  Patient was seen with Dr Virgel Bouquet Helmich (R2 at [facility]).     Assessment & Plan:   Below is the assessment and plan developed based on review of pertinent history, physical exam, labs, studies, and medications.    1. Hemodialysis-associated hypotension  Assessment & Plan:  New dx, differential reviewed, at this time nothing to suspect anything else or more serious.  Agree w/ life style changes but needs to be careful about orthostatic symptoms.  Encouraged him to talk to his nephrologist about options other then the Midodrine   2. Essential tremor  Assessment & Plan:  He reports worsening since stopping propranolol, will defer to neurology   Orders:  -     External Referral To Neurology  3. Generalized anxiety disorder  Assessment & Plan:  Worsening, multi-factorial (decreasing meds vs being taken off transplant list).  Monitored by specialist- no acute findings meriting change in the plan.   4. ESRD (end stage renal disease) on dialysis [facility])  Assessment & Plan:  Off transplant list currently, working to get back on this. Monitored by specialist- no acute findings meriting change in the plan     Return if symptoms worsen or fail to improve.   Subjective/Objective:   John Friedman was seen today for Blood Pressure Check     Since last visit: weight has increased.  Last seen in Dec '22.  He reports his BP has been low and [name] has inactivated him from the kidney transplant list.  He reports renal started him on Midodrine about 9 months ago for low BP after dialysis only.    He has spoken to his psychiatrist and is planning to start tapering Anafranil.  He is not planning on changing his klonopin.  He stopped propranolol 2 days ago.    He is very anxious to get back on the transplant      Prior to Admission medications    Medication Sig Start Date End Date Taking? Authorizing Provider   midodrine (PROAMATINE) 5 MG tablet Take 2 tablets by mouth Every  Monday, Wednesday, and Friday 08/09/21  Yes Historical Provider, MD   clomiPRAMINE (ANAFRANIL) 50 MG capsule Take 3 capsules by mouth   Yes Ar Automatic Reconciliation   clonazePAM (KLONOPIN) 1 MG tablet Take 1 tablet by mouth 2 times daily.   Yes Ar Automatic Reconciliation   FLUoxetine (PROZAC) 20 MG capsule Take 1 capsule by mouth daily 02/12/19  Yes Ar Automatic Reconciliation   levothyroxine (SYNTHROID) 75 MCG tablet TAKE 1 TABLET BY MOUTH DAILY EXCEPT TAKE 1 & 1/2 TABLETS ON SUNDAYS. 10/18/21  Yes Ar Automatic Reconciliation   propranolol (INDERAL) 20 MG tablet Take 1 tablet by mouth 2 times daily 02/12/19  Yes Ar Automatic Reconciliation        Review of Systems   Constitutional:  Negative for fatigue.   Respiratory:  Negative for shortness of breath.    Cardiovascular:  Negative for chest pain and leg swelling.   Neurological:  Positive for light-headedness (intermittent, has been present since starting HD, with standing). Negative for dizziness and headaches.   Psychiatric/Behavioral:  The patient is nervous/anxious.         Physical Exam  Cardiovascular:      Rate and Rhythm: Regular rhythm.      Pulses: Normal pulses.      Heart sounds: Normal heart sounds.   Psychiatric:      Comments: anxiety  Vitals:    04/15/22 0934   BP: 100/70   Pulse: 97   Resp: 16   Temp: 97.1 F (36.2 C)   TempSrc: Temporal   SpO2: 100%   Weight: 173 lb 12.8 oz (78.8 kg)   Height: '5\' 11"'$  (1.803 m)      Body mass index is 24.24 kg/m.     Renaee Munda, MD

## 2022-04-15 NOTE — Telephone Encounter (Signed)
Future Appointments   Date Time Provider [place]   06/08/2022 10:00 AM Antony Haste, MD [facility] BS AMB   11/29/2022  8:00 AM Antony Haste, MD [facility] BS AMB

## 2022-04-15 NOTE — Assessment & Plan Note (Signed)
He reports worsening since stopping propranolol, will defer to neurology

## 2022-04-15 NOTE — Assessment & Plan Note (Signed)
New dx, differential reviewed, at this time nothing to suspect anything else or more serious.  Agree w/ life style changes but needs to be careful about orthostatic symptoms.  Encouraged him to talk to his nephrologist about options other then the Midodrine

## 2022-04-21 NOTE — Telephone Encounter (Signed)
Pt called in angry and yelling stating we were suppose to fax his Neurology referral to Dr. Uvaldo Rising.  Asked pt not to yell at me so that I could help him.  Pt continued screaming at me, so call was disconnected.    Neurology referral and last OV notes faxed to Dr. Uvaldo Rising at [phone].

## 2022-04-22 NOTE — Telephone Encounter (Signed)
Reason for call:  TC from pt. Pt id verified. Pt would like a referral to Dr. Cassie Freer, [address], [place], [place] [address], Phone # [phone].     Is this a new problem: No    Date of last appointment:  04/15/2022     Can we respond via MyChart: No    Best call back number: [phone]

## 2022-04-22 NOTE — Telephone Encounter (Signed)
Spoke with patient after verified with 2 identifiers. Informed referral was faxed to Dr Cassie Freer office

## 2022-05-03 NOTE — Progress Notes (Signed)
Formatting of this note might be different from the original.    The patient is a 56 year old right-handed man with essential tremor who presents to discuss treatment on his contralateral left hand.  He had a successful left focused ultrasound thalamotomy in April 2022.  Initially the tremor control was 90% in his estimation, and now there has been some recurrence and he rates his improvement at 70-75%.  His tremor certainly worsened when he stopped propranolol for other medical reasons.  His chief problem now is really bothersome severe tremor in the left hand.      His medical history is significant for renal disease and he is awaiting a renal transplant.  He gets hemodialysis 3 times per week in [place].  He was on the transplant list at [facility], but they inactivated him because of hypotension.  That led to him stopping midodrine and propranolol.  He lives in [place].    We fully rated his tremor today with the clinical rating scale.  This certainly been some recurrence of his tremor on the right although he was able to draw a spiral and line drawings and handwriting, minimally sloppy.  He could not put the pen to the paper on the left hand.  His tremor amplitude exceeds 4 cm on the left in postural testing.    We discuss treatment of his left hand.  I think he would be a good candidate for right stereotactic focused ultrasound thalamotomy as he really had no problems with the first procedure.  His skull density ratio is 0.45, and we will get a preoperative MRI with inversion recovery sequences for planning.  From a logistical standpoint, we could treat him on Thursday so he could continue hemodialysis on Wednesday and Friday in [place].  I do not think he needs postoperative MRI imaging the next day so long as we can get an image after the procedure.  Electronically signed by Jerry Caras, MD at 05/03/2022 12:55 PM EDT

## 2022-06-08 ENCOUNTER — Telehealth
Admit: 2022-06-08 | Discharge: 2022-06-08 | Payer: BLUE CROSS/BLUE SHIELD | Attending: Internal Medicine | Primary: Internal Medicine

## 2022-06-08 DIAGNOSIS — G250 Essential tremor: Secondary | ICD-10-CM

## 2022-06-08 MED ORDER — PRIMIDONE 50 MG PO TABS
50 MG | ORAL_TABLET | ORAL | 0 refills | Status: DC
Start: 2022-06-08 — End: 2022-11-29

## 2022-06-08 NOTE — Assessment & Plan Note (Signed)
Asymptomatic, tolerating HD per his report off midodrine. Will defer to specialists.

## 2022-06-08 NOTE — Progress Notes (Signed)
John Friedman is a 56 y.o. male who was seen by synchronous (real-time) audio-video technology on 06/08/2022.      Assessment & Plan:   Below is the assessment and plan developed based on review of pertinent history, physical exam (if applicable), labs, studies, and medications.    1. Essential tremor  Assessment & Plan:  Worse w/ stopping propranolol (off due to hypotension).  He was asking about midodrine.  He reports was on it in the past for a short period of time w/o side effects. Reviewed dosing and expectations w/ dialysis.  Will start on low dose '25mg'$ .  Did review we can increase by '25mg'$  every 7 days.  He will update me if he thinks he needs to increase dose.  He has f/u with neurology in the spring.  Agree w/ seeing [facility] surgeon in December.  Orders:  -     primidone (MYSOLINE) 50 MG tablet; 1/2 tab PO three times/week (take it Monday, Wednesday, and Friday AFTER dialysis), Disp-30 tablet, R-0Normal  2. ESRD (end stage renal disease) on dialysis [facility])  Assessment & Plan:  Asymptomatic, tolerating HD per his report off midodrine. Will defer to specialists.      Return in about 6 months (around 12/08/2022) for FULL PHYSICAL.     Subjective:   John Friedman was seen for Follow-up (Pt states that he was made inactive from [facility] for kidney transplant. Pt has many questions and concerns. He can get in to see nephrologist Sep 07, 2022. John Friedman CMA ) and Tremors    He was last seen on 04/15/22.  Since last visit he wanted to update me on what has been going on.    He did get flu and COVID booster last week.    He has f/u with [name] transplant next week.  He has a stress test and echo scheduled.  He is off midodrine.  He reports BP is doing okay and tolerating dialysis w/o meds.  He is anxious to get back on the transplant list.     07/29/23 he is seeing [facility] neurosurgery to get focal treatment for his L arm tremor.  He has f/u with neurology on 09/07/22.  He is asking about Primidone to replace the  propranolol.  He reports being on this in the past and doesn't remember any side effects. He reports having trouble writing since stopping propranolol.       Prior to Admission medications    Medication Sig Start Date End Date Taking? Authorizing Provider   levothyroxine (SYNTHROID) 75 MCG tablet TAKE 1 TABLET BY MOUTH DAILY EXCEPT TAKE 1 & 1/2 TABLETS ON SUNDAYS. 04/15/22  Yes Antony Haste, MD   clomiPRAMINE (ANAFRANIL) 50 MG capsule Take 3 capsules by mouth   Yes Automatic Reconciliation, Ar   clonazePAM (KLONOPIN) 1 MG tablet Take 1 tablet by mouth 2 times daily.   Yes Automatic Reconciliation, Ar   FLUoxetine (PROZAC) 20 MG capsule Take 1 capsule by mouth daily 02/12/19  Yes Automatic Reconciliation, Ar       Objective:   No data recorded   General: alert, cooperative, and no distress   Mental  status: normal mood, behavior, speech, dress, motor activity, and thought processes, able to follow commands   Eyes:    Mouth:    Neck:    Resp: normal effort and no respiratory distress   Neuro: Tremor, visible moving of head and body   Musculoskeletal:    Skin:    Psychiatric: normal  affect     John Friedman, was evaluated through a synchronous (real-time) audio-video encounter. The patient (or guardian if applicable) is aware that this is a billable service, which includes applicable co-pays. This Virtual Visit was conducted with patient's (and/or legal guardian's) consent. Patient identification was verified, and a caregiver was present when appropriate.     The patient was located at Home:   [address]  [place] [address]    Provider was located at [facility] (Appt Dept):   [address] [place] [address]       Renaee Munda, MD

## 2022-06-08 NOTE — Assessment & Plan Note (Signed)
Worse w/ stopping propranolol (off due to hypotension).  He was asking about midodrine.  He reports was on it in the past for a short period of time w/o side effects. Reviewed dosing and expectations w/ dialysis.  Will start on low dose '25mg'$ .  Did review we can increase by '25mg'$  every 7 days.  He will update me if he thinks he needs to increase dose.  He has f/u with neurology in the spring.  Agree w/ seeing [facility] surgeon in December.

## 2022-07-24 ENCOUNTER — Encounter

## 2022-07-24 NOTE — Telephone Encounter (Signed)
Labs reviewed via CE, August '24.    Future Appointments   Date Time Provider Department Center   11/29/2022  8:00 AM Bud Face, MD [facility] BS AMB

## 2022-07-25 MED ORDER — LEVOTHYROXINE SODIUM 75 MCG PO TABS
75 MCG | ORAL_TABLET | ORAL | 1 refills | Status: AC
Start: 2022-07-25 — End: 2023-01-31

## 2022-09-02 ENCOUNTER — Encounter

## 2022-09-02 NOTE — Telephone Encounter (Signed)
Please call patient.  Medication was sent in back in Oct.  Why just requesting a refill now?  He should have run out of meds a long time ago.  How is he taking it?

## 2022-09-05 NOTE — Telephone Encounter (Signed)
Called pt regarding refill request. Pt reported he did not request a refill, and is no longer taking med. Pt reported he has appt with Neurology this week.

## 2022-10-04 NOTE — Progress Notes (Signed)
Formatting of this note is different from the original.  Images from the original note were not included.    NEUROSURGERY CLINIC NOTE  10/04/2022    HPI: Mr.John Friedman was just seen in the Neurosurgery Clinic at [facility].  This is a 57 y.o.RH male with essential tremor who underwent contralateral right FUS thalamotomy on 09/01/22 (left FUS 11/2020).  His procedure was uncomplicated and he tolerated well. Today he reports about 80-90% tremor suppression in the left hand.  He had post-procedural lip and tongue numbness which as resolved. He continues to have some balance instability with intermittent left foot dragging and slight speech slurring.  He recently stopped propranolol due to hypotension.  Since then, he has noticed slight increase in his right hand tremor currently 75% tremor suppression.  He can still eat, drink and write without difficulty.  His local neurologist prescribed primidone but this made his speech worse.  He is considering a trial of Topamax.     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 He has never used smokeless tobacco. He reports current alcohol use.  No family history on file.   MEDICATIONS:    Current Outpatient Medications   Medication Sig    CLOMIPRAMINE HCL PO Take 150 mg by mouth nightly.    clonazePAM (KLONOPIN) 2 MG tablet Take 0.5 tablets by mouth 2 times daily.    FLUoxetine (PROZAC) 20 MG capsule Take 1 capsule by mouth daily.    levothyroxine (SYNTHROID) 75 MCG tablet Take 1 tablet by mouth daily.  Take 30 minutes to 1 hour before breakfast     No current facility-administered medications for this visit.     ALLERGIES:    Allergies   Allergen Reactions     Latex Dermatitis, Hives, Itching, Rash and Other (See Comments)     redness  redness  CAUSES SKIN REDNESS  redness  CAUSES SKIN REDNESS  Other reaction(s): Other (comments)  redness  CAUSES SKIN REDNESS  redness  CAUSES SKIN REDNESS  Other reaction(s): Other (comments)  redness  CAUSES SKIN REDNESS  redness  redness  CAUSES SKIN REDNESS  redness  Other reaction(s): Other (comments)  redness  CAUSES SKIN REDNESS  redness  redness  CAUSES SKIN REDNESS  redness      ROS: Pertinent ROS noted in the HPI    Physical Exam  Constitutional:       General: He is not in acute distress.     Appearance: Normal appearance.   HENT:      Head: Normocephalic and atraumatic.   Pulmonary:      Effort: Pulmonary effort is normal.   Skin:     General: Skin is warm and dry.   Neurological:      Mental Status: He is alert and oriented to person, place, and time.      Comments: Slight speech slurring. Gait is steady and slow. Intermittent left foot dragging.   Psychiatric:  Mood and Affect: Mood normal.         Behavior: Behavior normal.         Thought Content: Thought content normal.         Judgment: Judgment normal.     Tremor Exam:  Today I re-rated his tremor using the Clinical Rating Scale for Tremor.  He has mild bilateral postural tremor in the winged position. Moderate tremor with spiral and line drawing but significantly provided from pre-op. His handwriting is fairly normal.    Post right FUS thalamotomy        Pre right FUS thalamotomy      ASSESSMENT/PLAN:    This is a pleasant 57 y.o. 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  He is overall doing well. His tremor has significantly improved.  He continues to have some balance instability and speech slurring which should continue to improve over the next few months. He should follow up as needed. He knows to call with any concerns or questions.    I spent 20  minutes today preparation to see the patient with medical record and test result review, obtaining and/or  reviewing separately obtained history, performing a medically appropriate examination and/or evaluation, counseling and educating the patient/family/caregiver, ordering medications, tests, or procedures, documenting clinical information in the electronic or other health record, independently interpreting results and communicating results to the patient/family/caregiver, and care coordination.      George Ina, MSN, AGACNP-BC, CNRN  Nurse Practitioner  Department of Neurosurgery  [facility]  PIC# [phone]'  Electronically signed by George Ina, NP at 10/04/2022  1:30 PM EST

## 2022-11-29 ENCOUNTER — Ambulatory Visit: Admit: 2022-11-29 | Discharge: 2022-11-29 | Payer: MEDICARE | Attending: Internal Medicine | Primary: Internal Medicine

## 2022-11-29 ENCOUNTER — Encounter

## 2022-11-29 NOTE — Assessment & Plan Note (Signed)
Chronic issue, slightly worse. Likely due to HD an also going back to the office 2 days/wk when previously all virtual.  Will monitor

## 2022-11-29 NOTE — ACP (Advance Care Planning) (Signed)
Advance Care Planning   Advance Care Planning (ACP) Physician/NP/PA (Provider) Conversation    Date of ACP Conversation: 11/29/22  Persons included in Conversation:  patient  Length of ACP Conversation in minutes: <16 minutes (Non-Billable)    We discussed the patient's choices for care and treatment preferences in case of a health event that adversely affects decision-making abilities or is life-limiting. We discusses the differences between FULL CODE and DNR and when to consider a change in code status.  The limitations with CPR were reviewed.    Has NO ACP documents-Information provided.  He elects Full Code (Attempt Resuscitation)    The patient has appointed the following active healthcare agents:    Primary Decision Maker: Fransico Meadow - Parent - [phone]    Secondary Decision Maker: Maxamillian, Tienda - Other [phone]     The Patient has the following current code status:    Code Status: Full Code    Julieta Bellini, MD

## 2022-11-29 NOTE — Assessment & Plan Note (Signed)
previously at goal but in the upper limits of normal, continue current treatment pending review of labs but we did review increasing dose by 1/2 tab per week

## 2022-11-29 NOTE — Assessment & Plan Note (Signed)
Improved.  Will remain off medications.  Encouraged to follow up with specialist if worsening.  Monitored by specialist- no acute findings meriting change in the plan

## 2022-11-29 NOTE — Addendum Note (Signed)
Addended by: Bud Face on: 11/29/2022 08:57 AM     Modules accepted: Orders

## 2022-11-29 NOTE — Patient Instructions (Addendum)
Patient Education        Preventing Falls: Care Instructions  Injuries and health problems such as trouble walking or poor eyesight can increase your risk of falling. So can some medicines. But there are things you can do to help prevent falls. You can exercise to get stronger. You can also arrange your home to make it safer.    Talk to your doctor about the medicines you take. Ask if any of them increase the risk of falls and whether they can be changed or stopped.   Try to exercise regularly. It can help improve your strength and balance. This can help lower your risk of falling.     Practice fall safety and prevention.    Wear low-heeled shoes that fit well and give your feet good support. Talk to your doctor if you have foot problems that make this hard.  Carry a cellphone or wear a medical alert device that you can use to call for help.  Use stepladders instead of chairs to reach high objects. Don't climb if you're at risk for falls. Ask for help, if needed.  Wear the correct eyeglasses, if you need them.    Make your home safer.    Remove rugs, cords, clutter, and furniture from walkways.  Keep your house well lit. Use night-lights in hallways and bathrooms.  Install and use sturdy handrails on stairways.  Wear nonskid footwear, even inside. Don't walk barefoot or in socks without shoes.    Be safe outside.    Use handrails, curb cuts, and ramps whenever possible.  Keep your hands free by using a shoulder bag or backpack.  Try to walk in well-lit areas. Watch out for uneven ground, changes in pavement, and debris.  Be careful in the winter. Walk on the grass or gravel when sidewalks are slippery. Use de-icer on steps and walkways. Add non-slip devices to shoes.    Put grab bars and nonskid mats in your shower or tub and near the toilet. Try to use a shower chair or bath bench when bathing.   Get into a tub or shower by putting in your weaker leg first. Get out with your strong side first. Have a phone or  medical alert device in the bathroom with you.   Where can you learn more?  Go to [employer] and enter G117 to learn more about "Preventing Falls: Care Instructions."  Current as of: February 28, 2022               Content Version: 14.0   2006-2024 [employer].   Care instructions adapted under license by [facility]. If you have questions about a medical condition or this instruction, always ask your healthcare professional. Healthwise, Incorporated disclaims any warranty or liability for your use of this information.            Fatigue: Care Instructions  Overview     Fatigue is a feeling of tiredness, exhaustion, or lack of energy. You may feel fatigue because of too much or not enough activity. It can also come from stress, lack of sleep, boredom, and poor diet. Many medical problems, such as viral infections, can cause fatigue. Emotional problems, especially depression, are often the cause of fatigue.  Fatigue is most often a symptom of another problem. Treatment for fatigue depends on the cause. For example, if you have fatigue because you have a certain health problem, treating this problem also treats your fatigue. If depression or anxiety is the cause,  treatment may help.  Follow-up care is a key part of your treatment and safety. Be sure to make and go to all appointments, and call your doctor if you are having problems. It's also a good idea to know your test results and keep a list of the medicines you take.  How can you care for yourself at home?  Get regular exercise. But try not to overdo it. It may help to go back and forth between rest and exercise.  Get plenty of rest.  Eat a variety of healthy foods. Try not to skip any meals.  Avoid or try to cut back on your use of caffeine, tobacco, and alcohol. Caffeine is most often found in coffee, tea, cola drinks, and energy drinks.  Limit medicines that can cause fatigue. These include medicines such as cold and  allergy medicines.  When should you call for help?  Watch closely for changes in your health, and be sure to contact your doctor if:    You have new symptoms such as fever or a rash.     Your fatigue gets worse.     You have been feeling down, depressed, or hopeless. Or you may have lost interest in things that you usually enjoy.     You are not getting better as expected.   Where can you learn more?  Go to [employer] and enter W864 to learn more about "Fatigue: Care Instructions."  Current as of: February 05, 2022               Content Version: 14.0   2006-2024 [employer]wise, Inc[employer].   Care instructions adapted under license by [employer] If you have questions about a medical condition or this instruction, always ask your healthcare professional. Healthwise, Incorporated disclaims any warranty or liability for your use of this information.           Advance Directives: Care Instructions  Overview  An advance directive is a legal way to state your wishes at the end of your life. It tells your family and your doctor what to do if you can't say what you want.  There are two main types of advance directives. You can change them any time your wishes change.  Living will.  This form tells your family and your doctor your wishes about life support and other treatment. The form is also called a declaration.  Medical power of attorney.  This form lets you name a person to make treatment decisions for you when you can't speak for yourself. This person is called a health care agent (health care proxy, health care surrogate). The form is also called a durable power of attorney for health care.  If you do not have an advance directive, decisions about your medical care may be made by a family member, or by a doctor or a judge who doesn't know you.  It may help to think of an advance directive as a gift to the people who care for you. If you have one, they won't have to make tough decisions by  themselves.  For more information, including forms for your state, see the [employer] website ([employer].com.cy).  Follow-up care is a key part of your treatment and safety. Be sure to make and go to all appointments, and call your doctor if you are having problems. It's also a good idea to know your test results and keep a list of the medicines you take.  What should you include in an advance directive?  Many states have a unique advance directive form. (It may ask you to address specific issues.) Or you might use a universal form that's approved by many states.  If your form doesn't tell you what to address, it may be hard to know what to include in your advance directive. Use the questions below to help you get started.  Who do you want to make decisions about your medical care if you are not able to?  What life-support measures do you want if you have a serious illness that gets worse over time or can't be cured?  What are you most afraid of that might happen? (Maybe you're afraid of having pain, losing your independence, or being kept alive by machines.)  Where would you prefer to die? (Your home? A hospital? A nursing home?)  Do you want to donate your organs when you die?  Do you want certain religious practices performed before you die?  When should you call for help?  Be sure to contact your doctor if you have any questions.  Where can you learn more?  Go to [employer] and enter R264 to learn more about "Advance Directives: Care Instructions."  Current as of: June 30, 2022               Content Version: 14.0   2006-2024 Healthwise, Incorporated.   Care instructions adapted under license by [facility]. If you have questions about a medical condition or this instruction, always ask your healthcare professional. Healthwise, Incorporated disclaims any warranty or liability for your use of this information.      Personalized Preventive Plan for  John Friedman - 11/29/2022  Medicare offers a range of preventive health benefits. Some of the tests and screenings are paid in full while other may be subject to a deductible, co-insurance, and/or copay.    Some of these benefits include a comprehensive review of your medical history including lifestyle, illnesses that may run in your family, and various assessments and screenings as appropriate.    After reviewing your medical record and screening and assessments performed today your provider may have ordered immunizations, labs, imaging, and/or referrals for you.  A list of these orders (if applicable) as well as your Preventive Care list are included within your After Visit Summary for your review.    Other Preventive Recommendations:    A preventive eye exam performed by an eye specialist is recommended every 1-2 years to screen for glaucoma; cataracts, macular degeneration, and other eye disorders.  A preventive dental visit is recommended every 6 months.  Try to get at least 150 minutes of exercise per week or 10,000 steps per day on a pedometer .  Order or download the FREE "Exercise & Physical Activity: Your Everyday Guide" from [employer]. Call 724-678-8526 or search [employer] online.  You need 1200-1500 mg of calcium and 1000-2000 IU of vitamin D per day. It is possible to meet your calcium requirement with diet alone, but a vitamin D supplement is usually necessary to meet this goal.  When exposed to the sun, use a sunscreen that protects against both UVA and UVB radiation with an SPF of 30 or greater. Reapply every 2 to 3 hours or after sweating, drying off with a towel, or swimming.  Always wear a seat belt when traveling in a car. Always wear a helmet when riding a bicycle or motorcycle.

## 2022-11-29 NOTE — Progress Notes (Signed)
Medicare Annual Wellness Visit    John Friedman is here for Medicare AWV    Assessment & Plan   Medicare annual wellness visit, subsequent  Advanced care planning/counseling discussion  -     FULL CODE  Prostate cancer screening  -     PSA Screening; Future  Hypothyroidism due to acquired atrophy of thyroid  Assessment & Plan:  previously at goal but in the upper limits of normal, continue current treatment pending review of labs but we did review increasing dose by 1/2 tab per week  Orders:  -     TSH; Future  ESRD (end stage renal disease) on dialysis [facility])  Assessment & Plan:  He reports back on transplant list at [facility]. Monitored by specialist- no acute findings meriting change in the plan  Chronic fatigue  Assessment & Plan:  Chronic issue, slightly worse. Likely due to HD an also going back to the office 2 days/wk when previously all virtual.  Will monitor   Essential tremor  Assessment & Plan:  Improved.  Will remain off medications.  Encouraged to follow up with specialist if worsening.  Monitored by specialist- no acute findings meriting change in the plan     Recommendations for Preventive Services Due: see orders and patient instructions/AVS.  Recommended screening schedule for the next 5-10 years is provided to the patient in written form: see Patient Instructions/AVS.     Return in 1 year (on 11/29/2023).       Subjective   Since last visit: weight is stable.    See ACP Note for Advanced Care Directive Discussion    Health Maintenance  Immunizations:   COVID: up to date  Influenza: up to date  RSV: n/a   Tetanus: up to date    Shingles:  not up to date - he reports never had chicken pox    Pneumonia: up to date    Cancer screening:     Colon: reviewed guidelines, up to date.    Prostate: reviewed guidelines, order placed       AAA Screening: n/a - never smoked       The following acute and/or chronic problems were addressed today:    Endocrine Review  Patient is seen for followup of hypothyroidism.   He reports medication compliance: all the time and is taking separate from all other meds.  He reports the following concerns/problems/med side effects: none.  He is asking if he needs/should increase this.       Patient's complete Health Risk Assessment and screening values have been reviewed and are found in Flowsheets. The following problems were reviewed today and where indicated follow up appointments were made and/or referrals ordered.    Positive Risk Factor Screenings with Interventions:             General HRA Questions:  Select all that apply: (!) New or Increased Fatigue  Fatigue Interventions:  See A/P - likely multifactorial    Activity, Diet, and Weight:  On average, how many days per week do you engage in moderate to strenuous exercise (like a brisk walk)?: 0 days  On average, how many minutes do you engage in exercise at this level?: 0 min    Do you eat balanced/healthy meals regularly?: Yes    Body mass index is 23.21 kg/m.      Inactivity Interventions:  Patient comments: due to chronic fatigue      Vision Screen:  Do you have difficulty driving, watching TV, or  doing any of your daily activities because of your eyesight?: No  Have you had an eye exam within the past year?: (!) No  No results found.  Interventions:   Patient encouraged to make appointment with their eye specialist    Safety:  Do you have any tripping hazards - loose or unsecured carpets or rugs?: (!) Yes  Interventions:  See AVS for additional education material     Advanced Directives:  Do you have a Living Will?: (!) No  Intervention:  see ACP note           Objective     Review of Systems   Constitutional:  Positive for fatigue. Negative for chills and fever.   Respiratory:  Negative for cough and shortness of breath.    Cardiovascular:  Negative for chest pain and palpitations.   Gastrointestinal:  Negative for abdominal pain, blood in stool, constipation, diarrhea, nausea and vomiting.   Genitourinary:  Positive for  difficulty urinating (slower stream). Negative for frequency and urgency.   Musculoskeletal:  Negative for arthralgias and myalgias.   Neurological:  Positive for tremors (Had procedure for L arm in Jan and it is improved.  He didn't tolerate Midodrine due to speech slurring). Negative for dizziness, numbness and headaches.   Hematological:  Does not bruise/bleed easily.   Psychiatric/Behavioral:  Negative for dysphoric mood and sleep disturbance. The patient is not nervous/anxious.        Vitals:    11/29/22 0758   BP: 116/77   Pulse: 90   Resp: 16   Temp: 97.3 F (36.3 C)   TempSrc: Temporal   SpO2: 100%   Weight: 76 kg (167 lb 9.6 oz)   Height: 1.81 m (5' 11.26")      Body mass index is 23.21 kg/m.      Physical Exam  Constitutional:       General: He is not in acute distress.  Neck:      Thyroid: No thyromegaly.   Cardiovascular:      Rate and Rhythm: Regular rhythm.      Heart sounds: No murmur heard.  Pulmonary:      Effort: Pulmonary effort is normal.      Breath sounds: Normal breath sounds. No wheezing.   Abdominal:      General: Bowel sounds are normal. There is no distension.      Palpations: Abdomen is soft.      Tenderness: There is no abdominal tenderness.   Musculoskeletal:      Right lower leg: No edema.      Left lower leg: No edema.      Comments: Bandage on the L upper arm fistula that is c/d/i   Lymphadenopathy:      Cervical: No cervical adenopathy.   Psychiatric:         Mood and Affect: Mood normal.         Behavior: Behavior normal.            Allergies   Allergen Reactions    Latex Other (See Comments)     redness     Prior to Visit Medications    Medication Sig Taking? Authorizing Provider   levothyroxine (SYNTHROID) 75 MCG tablet TAKE 1 TABLET BY MOUTH DAILY EXCEPT TAKE 1 & 1/2 TABLETS ON SUNDAYS. Yes Bud Face, MD   clomiPRAMINE (ANAFRANIL) 50 MG capsule Take 3 capsules by mouth Yes Automatic Reconciliation, Ar   clonazePAM (KLONOPIN) 1 MG tablet Take 1 tablet by mouth  2  times daily. Yes Automatic Reconciliation, Ar   FLUoxetine (PROZAC) 20 MG capsule Take 1 capsule by mouth daily Yes Automatic Reconciliation, Ar   primidone (MYSOLINE) 50 MG tablet 1/2 tab PO three times/week (take it Monday, Wednesday, and Friday AFTER dialysis)  Patient not taking: Reported on 11/29/2022  Bud Face, MD       CareTeam (Including outside providers/suppliers regularly involved in providing care):   Patient Care Team:  Bud Face, MD as PCP - General  Madilyn Fireman Earl Lites, MD as PCP - Empaneled Provider  Bokinsky, Leighton Roach, MD (Urology)  Alvester Chou, APRN - NP as Nurse Practitioner (Nephrology)  Dutch Gray, MD (Neurology)  Orrin Brigham, MD (Gastroenterology)     Reviewed and updated this visit:  Tobacco  Allergies  Meds  Problems  Med Hx  Surg Hx  Soc Hx  Fam Hx

## 2022-11-29 NOTE — Assessment & Plan Note (Signed)
He reports back on transplant list at [facility]. Monitored by specialist- no acute findings meriting change in the plan

## 2022-11-30 NOTE — Other (Signed)
Results released to patient via MyChart.  All labs are stable or at goal for him, except for high TSH.  Will increase by 1/2 tab on Sunday.

## 2022-12-06 NOTE — Telephone Encounter (Signed)
Please call re unread [facility]    Jonerik, all your labs are stable or at goal for you, except for your TSH (measurement of your thyroid function).  Lets increase the dose like we talked about.  I would recommend 1 tab per day except for 2 tabs on Sunday (or whatever day works best for you).  You should be taking 1 tab per day and 1 and 1/2 tabs on Sunday currently.  We can repeat this in 3 months to see if the change has helped.   Written by Bud Face, MD on 11/30/2022 12:13 PM EDT

## 2022-12-06 NOTE — Telephone Encounter (Signed)
Called pt, ID x 2, reviewed unread mcm. Pt verbalized understanding, will increase med as recommended.

## 2023-01-31 ENCOUNTER — Encounter

## 2023-01-31 MED ORDER — LEVOTHYROXINE SODIUM 75 MCG PO TABS
75 | ORAL_TABLET | ORAL | 1 refills | Status: AC
Start: 2023-01-31 — End: ?

## 2023-01-31 NOTE — Telephone Encounter (Signed)
Chief Complaint   Patient presents with    Medication Refill     Last Appointment with Dr. Madilyn Fireman:  11/29/2022   Future Appointments   Date Time Provider Department Center   12/04/2023  8:00 AM Bud Face, MD [facility] BS AMB

## 2023-02-08 ENCOUNTER — Telehealth

## 2023-02-08 NOTE — Telephone Encounter (Signed)
Reason for call:  TC from pt. Pt id verified by two identifiers. Pt states he is tired during the day, sleepy during the day, and it's interfering with his work. Pt would like to get a referral to see a pulmonologist to see if he has sleep apnea.     Is this a new problem: Yes    Date of last appointment:  11/29/2022     Can we respond via MyChart: No    Best call back number: [phone]

## 2023-02-08 NOTE — Telephone Encounter (Signed)
Returned call to patient, ID x 2. Pt requested referral to pulmonology to see if he has sleep apnea d/t increase fatigue, tiredness over the past 2 weeks. Pt stated he has chronic fatigue, but it has increased. Pt stated he is sleeping well at night and all night, stated he doesn't understand why he is so sleepy during the day. Pt stated he never feels good d/t dialysis, and is lightheaded occasionally, " I also don't drink enough water, and don't produce much urine." Will forward to MD for review.

## 2023-02-08 NOTE — Telephone Encounter (Signed)
Referral to [facility] (Dr Karie Mainland).  There are several sleep specialists in the [facility] system, Dr Evelene Croon or any other partners are also very good.    With everything going on I think ruling out OSA is a good idea, however I think it has more to do with dialysis.  Also his TSH was high on last check.  He was supposed to increase his dose of medication to: 1 tab per day except for 2 tabs on Sunday.  Please verify he did this.  I would also repeat this labs to make sure this has improved and the thyroid is not contributing.  He can use the lab on the 1st floor.  He does NOT need to fast.  Labs have been ordered so you can just show up.

## 2023-02-09 NOTE — Telephone Encounter (Signed)
Called pt, ID x 2. Advised per Dr.Hayes' note regarding Pulmonology referral and  his recommendations. Pt verbalized understanding, stated he forgot to increase Levothyroxine dose to 2 tabs on Sunday's. Pt stated he would start taking as recommended.

## 2023-05-02 ENCOUNTER — Encounter

## 2023-08-20 ENCOUNTER — Encounter: Admit: 2023-08-20 | Admitting: Internal Medicine

## 2023-08-20 DIAGNOSIS — E038 Other specified hypothyroidism: Secondary | ICD-10-CM

## 2023-08-21 NOTE — Telephone Encounter (Signed)
Please call patient.  Dose increased at his last visit.  Follow up labs ordered to verify dose change is appropriate. He never got this done. Needs to get labs done ASAP to verify correct dose.

## 2023-08-21 NOTE — Telephone Encounter (Signed)
 Verified patient identity with two identifiers. Spoke with patient by phone and reviewed MD message to have labs drawn ASAP and could come to building here under staircase and he stated he was almost out.  Advised to have labs drawn in order to get a refill. Patient verbalized understanding.

## 2023-08-22 ENCOUNTER — Encounter: Admit: 2023-08-22 | Admitting: Internal Medicine

## 2023-08-22 DIAGNOSIS — E038 Other specified hypothyroidism: Secondary | ICD-10-CM

## 2023-08-22 NOTE — Telephone Encounter (Signed)
Request sent to MD for review.

## 2023-08-22 NOTE — Telephone Encounter (Signed)
 Patient came to lab this AM and closed. Not able to get labs done until, latest next Tuesday. He need a refill to cover until results available.

## 2023-08-22 NOTE — Telephone Encounter (Signed)
 Patient would like to know if Dr. Dyane can send in a 30 day supply of Levothyroxine .  He will be out tomorrow.  Patient came to our office this morning, at 8:00 AM, to have labs done, but we were not open yet.  He then went to [facility] and they did not have an order.  Advised patient that he needs to use the lab downstairs.  Due to the weather and work, he does not know when he'll be able to have them done.    John Friedman - [phone]

## 2023-08-23 ENCOUNTER — Encounter

## 2023-08-23 LAB — TSH: TSH, 3rd Generation: 8.12 u[IU]/mL — ABNORMAL HIGH (ref 0.36–3.74)

## 2023-08-23 LAB — T4, FREE: T4 Free: 0.8 ng/dL (ref 0.8–1.5)

## 2023-08-23 MED ORDER — LEVOTHYROXINE SODIUM 75 MCG PO TABS
75 | ORAL_TABLET | Freq: Every day | ORAL | 0 refills | Status: DC
Start: 2023-08-23 — End: 2023-08-23

## 2023-08-23 MED ORDER — LEVOTHYROXINE SODIUM 100 MCG PO TABS
100 MCG | ORAL_TABLET | Freq: Every day | ORAL | 0 refills | Status: DC
Start: 2023-08-23 — End: 2023-10-13

## 2023-08-23 NOTE — Other (Signed)
Results released to patient via MyChart.  TSH dose increased in April '24 due to worsening TSH.  TSH level is even higher now.  Dose increased. Will need labs in 2 months.  Dose increased to , 1 tab daily.

## 2023-08-23 NOTE — Telephone Encounter (Signed)
Dose increased, needs labs in 2-3 months.    Future Appointments   Date Time Provider Department Center   12/04/2023  8:00 AM Bud Face, MD [facility] ECC DEP

## 2023-08-29 NOTE — Telephone Encounter (Signed)
Notified patient per MD result note. Patient verbalized understanding.

## 2023-08-29 NOTE — Telephone Encounter (Signed)
Reason for call:  TC from pt. Pt id verified by two identifiers. Pt states he called his pharmacy and was told they had not received a new script for his levothyroxine (SYNTHROID) 100 MCG tablet. PSR advised pt that Dr. Madilyn Fireman sent a new script to [id number] [place] - [address] on 08/23/23 and a receipt confirmation was received. On behalf of pt, PSR called pt's pharmacy, to confirm script had been received, and per pharmacist, they did receive script and medication was being filled and pt could pick it up at 10:30 today. PSR advised pt script was received and he could pick up medication this morning. Pt verbalized understanding. Pt stated he completed labs on 08/23/23 but has not received the results. PSR advised pt Dr. Madilyn Fireman sent him a MyChart message. Pt stated he does not use MyChart and would like a call from nurse to discuss lab results.     Is this a new problem: Yes    Date of last appointment:  11/29/2022     Can we respond via MyChart: No - pt does not use MyChart - pt has not used since 2023.    Best call back number: [phone]

## 2023-10-02 NOTE — Telephone Encounter (Signed)
Medication sent 08/2023. Requesting too soon

## 2023-10-02 NOTE — Telephone Encounter (Signed)
Medication Refill Request    John Friedman is requesting a refill of the following medication(s):   levothyroxine (SYNTHROID) 100 MCG tablet   Please send refill to:     [id number] [place], [place] - [address] - P [phone] - F [phone]  [address]  [place] [address]  Phone: [phone] Fax: [phone]

## 2023-10-09 ENCOUNTER — Telehealth

## 2023-10-09 NOTE — Addendum Note (Signed)
 Addended by: Bud Face on: 10/09/2023 04:17 PM     Modules accepted: Orders

## 2023-10-09 NOTE — Telephone Encounter (Signed)
 Please call patient.  In Jan '25 dose of levothyroxine increased due to worsening TSH.  Please verify dose and how he is taking it.  He is due to repeat labs.  Verify where he wants to get this done and I will place the order, he does not need to be fasting.      TSH, 3rd Generation   Date Value Ref Range Status   08/23/2023 8.12 (H) 0.36 - 3.74 uIU/mL Final   11/29/2022 5.48 (H) 0.36 - 3.74 uIU/mL Final     TSH   Date Value Ref Range Status   07/30/2021 2.84 0.36 - 3.74 uIU/mL Final   06/02/2020 4.180 0.450 - 4.500 uIU/mL Final   03/25/2020 5.290 (H) 0.450 - 4.500 uIU/mL Final

## 2023-10-12 ENCOUNTER — Encounter

## 2023-10-13 ENCOUNTER — Encounter

## 2023-10-13 LAB — TSH: TSH, 3rd Generation: 3.44 u[IU]/mL (ref 0.36–3.74)

## 2023-10-13 MED ORDER — LEVOTHYROXINE SODIUM 100 MCG PO TABS
100 | ORAL_TABLET | Freq: Every day | ORAL | 3 refills | Status: AC
Start: 2023-10-13 — End: ?

## 2023-10-13 NOTE — Telephone Encounter (Signed)
 Future Appointments   Date Time Provider Department Center   12/04/2023  8:00 AM Bud Face, MD [facility] ECC DEP

## 2023-10-13 NOTE — Other (Signed)
 Results released to patient via MyChart.  TSH is improved, control is acceptable.

## 2023-10-25 NOTE — Telephone Encounter (Signed)
 MyChart deactivated per his request.

## 2023-10-25 NOTE — Telephone Encounter (Signed)
 Verified patient identity with two identifiers. Spoke with patient by phone and reviewed MD message as patient states he does not use [facility] as he has too many already.  Patient verbalized understanding.

## 2023-10-25 NOTE — Telephone Encounter (Signed)
 Patient requesting a callback with his thyroid test results -- patient states he does not use mychart.

## 2023-11-10 NOTE — Telephone Encounter (Signed)
 na

## 2023-11-30 NOTE — Telephone Encounter (Signed)
 Contacted patient regarding upcoming Medicare wellness appointment and completion of HRA questionnaire. Questionnaire completed via phone.

## 2023-11-30 NOTE — Progress Notes (Signed)
 Received [employer] STD, placed on MD desk for review.

## 2023-12-04 ENCOUNTER — Ambulatory Visit: Admit: 2023-12-04 | Discharge: 2023-12-04 | Payer: MEDICARE | Attending: Internal Medicine | Primary: Internal Medicine

## 2023-12-04 ENCOUNTER — Encounter

## 2023-12-04 VITALS — BP 100/64 | HR 87 | Temp 97.30000°F | Resp 16 | Ht 69.96 in | Wt 165.6 lb

## 2023-12-04 DIAGNOSIS — Z0000 Encounter for general adult medical examination without abnormal findings: Secondary | ICD-10-CM

## 2023-12-04 LAB — PSA SCREENING: PSA: 1.1 ng/mL (ref 0.01–4.0)

## 2023-12-04 LAB — TSH: TSH, 3rd Generation: 1.72 u[IU]/mL (ref 0.36–3.74)

## 2023-12-04 LAB — T4, FREE: T4 Free: 0.9 ng/dL (ref 0.8–1.5)

## 2023-12-04 NOTE — Assessment & Plan Note (Signed)
 New diagnosis, on oral appliance. Monitored by specialist- no acute findings meriting change in the plan

## 2023-12-04 NOTE — Assessment & Plan Note (Signed)
 Stable and he reports well controlled.  Monitored by specialist- no acute findings meriting change in the plan.

## 2023-12-04 NOTE — Progress Notes (Signed)
 Medicare Annual Wellness Visit    John Friedman is here for Medicare AWV    Assessment & Plan   Medicare annual wellness visit, subsequent  Advanced care planning/counseling discussion  Prostate cancer screening  -     PSA Screening; Future  Hypothyroidism due to acquired atrophy of thyroid  Assessment & Plan:  Levels have been fluctuating, at goal at last check, will repeat to verify stability.   Orders:  -     TSH; Future  -     T4, Free; Future  OSA (obstructive sleep apnea)  Assessment & Plan:  New diagnosis, on oral appliance. Monitored by specialist- no acute findings meriting change in the plan  ESRD (end stage renal disease) on dialysis [facility])  Assessment & Plan:  He reports back on transplant list at [facility]. Monitored by specialist- no acute findings meriting change in the plan  Generalized anxiety disorder  Assessment & Plan:  Stable and he reports well controlled.  Monitored by specialist- no acute findings meriting change in the plan.      Recommendations for Preventive Services Due: see orders and patient instructions/AVS.  Recommended screening schedule for the next 5-10 years is provided to the patient in written form: see Patient Instructions/AVS.     Return in 1 year (on 12/03/2024) for Medicare AWV.       Subjective   Since last visit: weight is stable.    See ACP Note for Advanced Care Directive Discussion    Health Maintenance  Immunizations:   COVID: up to date  Influenza: up to date  Tetanus: up to date    Shingles: declined - reports never had chicken pox  Pneumonia: up to date    Cancer screening:     Colon: reviewed guidelines, up to date.    Prostate: reviewed guidelines, order placed.      AAA Screening: n/a - never smoked       The following acute and/or chronic problems were addressed today:     Endocrine Review  Patient is seen for followup of hypothyroidism.  He reports medication compliance: all the time and is taking separate from all other meds.  He reports the following  concerns/problems/med side effects: none.  He is asking if he needs/should increase this.      Review of Systems   Constitutional:  Negative for chills, fatigue and fever.   Respiratory:  Negative for cough and shortness of breath.    Cardiovascular:  Negative for chest pain and palpitations.   Gastrointestinal:  Negative for abdominal pain, blood in stool, constipation, diarrhea, nausea and vomiting.   Musculoskeletal:  Negative for arthralgias and myalgias.   Neurological:  Positive for tremors (minimal). Negative for dizziness, numbness and headaches.   Hematological:  Does not bruise/bleed easily.   Psychiatric/Behavioral:  Negative for dysphoric mood and sleep disturbance. The patient is not nervous/anxious.        Patient's complete Health Risk Assessment and screening values have been reviewed and are found in Flowsheets. The following problems were reviewed today and where indicated follow up appointments were made and/or referrals ordered.    Positive Risk Factor Screenings with Interventions:              Inactivity:  On average, how many days per week do you engage in moderate to strenuous exercise (like a brisk walk)?: (Proxy-Rptd) 0 days (!) Abnormal  On average, how many minutes do you engage in exercise at this level?: (Proxy-Rptd) 0 min  Interventions:  See AVS for additional education material        Vision Screen:  Do you have difficulty driving, watching TV, or doing any of your daily activities because of your eyesight?: (Proxy-Rptd) No  Have you had an eye exam within the past year?: (!) (Proxy-Rptd) No  Interventions:   Patient encouraged to make appointment with their eye specialist    Safety:  Do you have any tripping hazards - loose or unsecured carpets or rugs?: (!) (Proxy-Rptd) Yes  Interventions:  See AVS for additional education material     Advanced Directives:  Do you have a Living Will?: (!) (Proxy-Rptd) No  Intervention:  see ACP note           Objective   Vitals:    12/04/23 0756    BP: 100/64   Pulse: 87   Resp: 16   Temp: 97.3 F (36.3 C)   TempSrc: Temporal   SpO2: 99%   Weight: 75.1 kg (165 lb 9.6 oz)   Height: 1.777 m (5' 9.96")      Body mass index is 23.79 kg/m.          Physical Exam  Constitutional:       General: He is not in acute distress.  Neck:      Thyroid: No thyromegaly or thyroid tenderness.   Cardiovascular:      Rate and Rhythm: Regular rhythm.      Heart sounds: No murmur heard.  Pulmonary:      Effort: Pulmonary effort is normal.      Breath sounds: Normal breath sounds. No wheezing.   Abdominal:      General: Bowel sounds are normal. There is no distension.      Palpations: Abdomen is soft.      Tenderness: There is no abdominal tenderness.   Musculoskeletal:      Right lower leg: No edema.      Left lower leg: No edema.   Lymphadenopathy:      Cervical: No cervical adenopathy.   Psychiatric:         Mood and Affect: Mood normal.         Behavior: Behavior normal.                 Allergies   Allergen Reactions    Latex Other (See Comments)     redness     Prior to Visit Medications    Medication Sig Taking? Authorizing Provider   levothyroxine  (SYNTHROID ) 100 MCG tablet Take 1 tablet by mouth Daily Yes Oza Blumenthal, MD   clomiPRAMINE (ANAFRANIL) 50 MG capsule Take 3 capsules by mouth Yes Automatic Reconciliation, Ar   clonazePAM (KLONOPIN) 1 MG tablet Take 1 tablet by mouth 2 times daily. Yes Automatic Reconciliation, Ar   FLUoxetine (PROZAC) 20 MG capsule Take 1 capsule by mouth daily Yes Automatic Reconciliation, Ar       CareTeam (Including outside providers/suppliers regularly involved in providing care):   Patient Care Team:  Oza Blumenthal, MD as PCP - General  Sabra Cramp Willis Harter, MD as PCP - Empaneled Provider  Bokinsky, Maurine Sovereign, MD (Urology)  Phillips, Loretta Janel, APRN - NP as Nurse Practitioner (Nephrology)  Jonni Nettle, MD (Neurology)  Jimmie Moulder, MD (Gastroenterology)      Reviewed and updated this visit:  Tobacco  Allergies   Meds  Problems  Med Hx  Surg Hx  Fam Hx  Sexual   Hx

## 2023-12-04 NOTE — Assessment & Plan Note (Signed)
 Levels have been fluctuating, at goal at last check, will repeat to verify stability.

## 2023-12-04 NOTE — ACP (Advance Care Planning) (Signed)
 Advance Care Planning   Advance Care Planning (ACP) Physician/NP/PA (Provider) Conversation    Date of ACP Conversation: 12/04/23  Persons included in Conversation:  patient  Length of ACP Conversation in minutes: <16 minutes (Non-Billable)    We discussed the patient's choices for care and treatment preferences in case of a health event that adversely affects decision-making abilities or is life-limiting. We discusses the differences between FULL CODE and DNR and when to consider a change in code status.  The limitations with CPR were reviewed.    Has NO ACP documents-Information provided.  He elects Full Code (Attempt Resuscitation)    The patient has appointed the following active healthcare agents:    Primary Decision Maker: John Friedman - Parent - [phone]    Secondary Decision Maker: John Friedman, John Friedman - Other - [phone]     Nidia Barrows, MD

## 2023-12-04 NOTE — Patient Instructions (Signed)
 Home Safety Alarms: Care Instructions  Overview  Home safety alarms save lives. For example, a smoke alarm can detect small amounts of smoke. This can give you time to escape from a fire. And a carbon monoxide alarm can let you know about this deadly gas before it starts to make you sick. It's important to have both kinds of alarms near all the sleeping areas and on each level of your home.  You can buy alarms with different features. For example, if you have a smoke alarm that is set off by steam or cooking smoke, you can buy one with a hush alarm. This lets you push a button that turns off the alarm and makes it less sensitive for a short time.  If you put in new alarms, look for long-life alarms with lithium batteries. You may also want to look for ones that can detect both smoke and carbon monoxide.  In a newer home, alarms are wired in by an Personnel officer. This type of alarm is electric, with a backup battery.  Your local fire department can give you more information on how to prevent fires and carbon monoxide poisoning. They can also help you make a fire escape plan, use fire safety devices, and provide first-aid.  Follow-up care is a key part of your treatment and safety. Be sure to make and go to all appointments, and call your doctor if you are having problems. It's also a good idea to know your test results and keep a list of the medicines you take.  How can you care for yourself at home?  Install smoke alarms and carbon monoxide alarms in your house.  Put smoke alarms:  On each level of your home, in the hallway outside sleeping areas, and inside each bedroom.  On the ceiling or high up on a wall. This is where smoke goes first. Avoid places near stoves, doors, windows, or air ducts.  Put a carbon monoxide alarm in the hallway outside of the bedrooms in each sleeping area of the house. The alarm should be placed high on the wall. Make sure that the alarm can't be covered up by furniture or  drapes.  Make sure your safety alarms are working at all times. You can test them every month by pressing the test button.  If an alarm makes a chirping sound, replace the battery right away.  Replace non-lithium batteries twice a year. Put this on your calendar ahead of time. Some people change alarm batteries when they reset their clocks in the spring and fall.  Replace smoke alarms every 10 years.  Plan and practice fire escape routes. Make sure you have at least two for each area of your home. This includes upper stories and the basement.  When should you call for help?   Call 911 anytime you think you may need emergency care. For example, call if:    A smoke or carbon monoxide alarm sounds. Tell everyone to get out of the building. Stand outside until firefighters arrive.   Watch closely for changes in your health, and be sure to contact your doctor if you have questions about how to use a home safety alarm.  Current as of: December 13, 2022  Content Version: 14.4   2024-2025 [place], [place].   Care instructions adapted under license by [facility]. If you have questions about a medical condition or this instruction, always ask your healthcare professional. Laren Player, [facility], disclaims any warranty or liability for your use  of this information.       Advance Directives: Care Instructions  Overview  An advance directive is a legal way to state your wishes at the end of your life. It tells your family and your doctor what to do if you can't say what you want.  There are two main types of advance directives. You can change them any time your wishes change.  Living will.  This form tells your family and your doctor your wishes about life support and other treatment. The form is also called a declaration.  Medical power of attorney.  This form lets you name a person to make treatment decisions for you when you can't speak for yourself. This person is called a health care agent (health care proxy, health  care surrogate). The form is also called a durable power of attorney for health care.  If you do not have an advance directive, decisions about your medical care may be made by a family member, or by a doctor or a judge who doesn't know you.  It may help to think of an advance directive as a gift to the people who care for you. If you have one, they won't have to make tough decisions by themselves.  For more information, including forms for your state, see the [employer] website ([employer]cy).  Follow-up care is a key part of your treatment and safety. Be sure to make and go to all appointments, and call your doctor if you are having problems. It's also a good idea to know your test results and keep a list of the medicines you take.  What should you include in an advance directive?  Many states have a unique advance directive form. (It may ask you to address specific issues.) Or you might use a universal form that's approved by many states.  If your form doesn't tell you what to address, it may be hard to know what to include in your advance directive. Use the questions below to help you get started.  Who do you want to make decisions about your medical care if you are not able to?  What life-support measures do you want if you have a serious illness that gets worse over time or can't be cured?  What are you most afraid of that might happen? (Maybe you're afraid of having pain, losing your independence, or being kept alive by machines.)  Where would you prefer to die? (Your home? A hospital? A nursing home?)  Do you want to donate your organs when you die?  Do you want certain religious practices performed before you die?  When should you call for help?  Be sure to contact your doctor if you have any questions.  Where can you learn more?  Go to [employer] and enter R264 to learn more about "Advance Directives: Care Instructions."  Current as of: October 14, 2022  Content Version: 14.4   2024-2025 [place].   Care instructions adapted under license by [facility]. If you have questions about a medical condition or this instruction, always ask your healthcare professional. Laren Player, [facility], disclaims any warranty or liability for your use of this information.    Personalized Preventive Plan for SHERRILL MCKAMIE - 12/04/2023  Medicare offers a range of preventive health benefits. Some of the tests and screenings are paid in full while other may be subject to a deductible, co-insurance, and/or copay.  Some of these benefits include a comprehensive review of your medical history  including lifestyle, illnesses that may run in your family, and various assessments and screenings as appropriate.  After reviewing your medical record and screening and assessments performed today your provider may have ordered immunizations, labs, imaging, and/or referrals for you.  A list of these orders (if applicable) as well as your Preventive Care list are included within your After Visit Summary for your review.

## 2023-12-04 NOTE — Assessment & Plan Note (Signed)
He reports back on transplant list at [facility]. Monitored by specialist- no acute findings meriting change in the plan

## 2023-12-05 NOTE — Other (Signed)
 Results released to patient via MyChart.  All labs are stable or at goal for him, except for increase in PSA.  It was 0.4 last year.  At [facility] in August was 3.08.  Now down to 1.1. Will recommend having it repeated at [facility] in the Fall.  Due to fluctuation most likely inflammation.

## 2024-01-24 DIAGNOSIS — K047 Periapical abscess without sinus: Secondary | ICD-10-CM

## 2024-01-24 NOTE — ED Triage Notes (Signed)
 [facility] Nursing Note        Patient Name: John Friedman      DOB: July 10, 1966             MRN: [id number]      Chief Complaint:  Dental Pain      Admit Diagnosis: No admission diagnoses are documented for this encounter.      Admitting Provider: No admitting provider for patient encounter.      Surgery: * No surgery found *           Patient arrived to the ER ambulatory from home with complaints of Front Upper Tooth Ache that started 10 days ago.         Lines:        Signed by: Merita Staples, RN, MBA, BSN, [facility]                                              01/24/2024 at 10:32 PM

## 2024-01-24 NOTE — ED Provider Notes (Signed)
 [facility]  EMERGENCY DEPARTMENT ENCOUNTER      Pt Name: John Friedman  MRN: [id number]  Birthdate [birth year]  Date of evaluation: 01/24/2024  Provider: Casper Clement, DO      HISTORY OF PRESENT ILLNESS      HPI    58 year old male with history as below, presents to the emergency department complaining of dental pain in his front upper tooth that started 10 days ago.  He states that he has an appointment with his dentist on Monday but is concerned that if he does not start on antibiotics he will get meningitis.  No fever or other systemic symptoms notes pain in his upper tooth and gums.  He states that he just got dialysis prior to arrival.    Nursing Notes were reviewed.    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    Lumbar radiculopathy 08/10/2012    S/p steroid injection by Dr Dolan Freiberg 08/01/12     Normal cardiac stress test 09/05/11    Pleural effusion, bilateral 01/21/2019    Pulmonary edema 01/21/2019    Sleep disturbances     acting out in his sleep    Thrombocytopenia 01/21/2019         SURGICAL HISTORY       Past Surgical History:   Procedure Laterality Date    AV FISTULA CREATION Left 07/24/2020    left AVF    CHOLECYSTECTOMY  12/05/2011    abnormal HIDA scan (EF < 2%)    COLONOSCOPY  07/12/2010    hyperplastic polyp, repeat 10 yrs    COLONOSCOPY  02/23/2015    normal    CYST REMOVAL Right 01/06/2016    3rd toe ganglion  cyst removal - podiatry (Dr Linnell Richardson)    HEENT Left 06/2012    tonsilar bx: benign tissue (Dr Kathyanne Parkers)    ORTHOPEDIC SURGERY      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20    OTHER SURGICAL HISTORY  11/26/2020    L stereotactic focused US  thalamotomy for ET    OTHER SURGICAL HISTORY  08/2022    R stereotactic focused US  thalamotomy for ET    SHOULDER ARTHROSCOPY Right 08/13/2018    TESTICLE REMOVAL Left 2002    TURP  10/28/2020    @ [name]    UPPER GASTROINTESTINAL ENDOSCOPY  09/09/2011    EGD- normal         CURRENT MEDICATIONS       Discharge Medication List as of 01/24/2024 11:23 PM        CONTINUE these medications which have NOT CHANGED    Details   levothyroxine  (SYNTHROID ) 100 MCG tablet  Take 1 tablet by mouth Daily, Disp-90 tablet, R-3Normal      clomiPRAMINE (ANAFRANIL) 50 MG capsule Take 3 capsules by mouthHistorical Med      clonazePAM (KLONOPIN) 1 MG tablet Take 1 tablet by mouth 2 times daily.Historical Med      FLUoxetine (PROZAC) 20 MG capsule Take 1 capsule by mouth dailyHistorical Med             ALLERGIES     Latex    FAMILY HISTORY       Family History   Problem Relation Age of Onset    No Known Problems Mother     Parkinsonism Father     Osteoarthritis Father         scoliosis    Dementia Father     Cancer Paternal Grandfather         prostate    No Known Problems Brother     No Known Problems Brother     Cancer Maternal Grandfather         prostate    No Known Problems Sister           SOCIAL HISTORY       Social History     Socioeconomic History    Marital status: Single   Tobacco Use    Smoking status: Never    Smokeless tobacco: Never   Substance and Sexual Activity    Alcohol use: Yes    Drug use: No    Sexual activity: Not Currently     Social Drivers of Health     Financial Resource Strain: Low Risk  (11/29/2022)    Overall Financial Resource Strain (CARDIA)     Difficulty of Paying Living Expenses: Not hard at all   Food Insecurity: No Food Insecurity (12/04/2023)    Hunger Vital Sign     Worried About Running  Out of Food in the Last Year: Never true     Ran Out of Food in the Last Year: Never true   Transportation Needs: No Transportation Needs (12/04/2023)    PRAPARE - Therapist, art (Medical): No     Lack of Transportation (Non-Medical): No   Physical Activity: Inactive (11/30/2023)    Exercise Vital Sign     Days of Exercise per Week: 0 days     Minutes of Exercise per Session: 0 min   Housing Stability: Low Risk  (12/04/2023)    Housing Stability Vital Sign     Unable to Pay for Housing in the Last Year: No     Number of Times Moved in the Last Year: 0     Homeless in the Last Year: No         PHYSICAL EXAM       ED Triage Vitals [01/24/24 2230]   BP Systolic BP Percentile Diastolic BP Percentile Temp Temp Source Pulse Respirations SpO2   (!) 89/67 -- -- 97.9 F (36.6 C) Oral 98 18 100 %      Height Weight - Scale         1.791 m (5' 10.5) 75 kg (165 lb 5.5 oz)             Body mass index is 23.39 kg/m.    Physical Exam  Vitals and nursing note reviewed.   Constitutional:       General: He is not in acute distress.     Appearance: Normal appearance. He is not ill-appearing.   HENT:  Head: Normocephalic and atraumatic.      Nose: Nose normal.      Mouth/Throat:      Mouth: Mucous membranes are moist.     Eyes:      Extraocular Movements: Extraocular movements intact.      Conjunctiva/sclera: Conjunctivae normal.      Pupils: Pupils are equal, round, and reactive to light.   Cardiovascular:      Rate and Rhythm: Normal rate and regular rhythm.      Heart sounds: Normal heart sounds.   Pulmonary:      Effort: Pulmonary effort is normal.      Breath sounds: Normal breath sounds.   Abdominal:      General: There is no distension.      Palpations: Abdomen is soft.      Tenderness: There is no abdominal tenderness.   Musculoskeletal:         General: No tenderness.      Cervical back: Normal range of motion and neck supple.   Skin:     General: Skin is warm and dry.   Neurological:       General: No focal deficit present.      Mental Status: He is alert and oriented to person, place, and time.             EMERGENCY DEPARTMENT COURSE and DIFFERENTIAL DIAGNOSIS/MDM:   Vitals:    Vitals:    01/24/24 2230 01/24/24 2315 01/24/24 2330   BP: (!) 89/67 104/73 102/71   Pulse: 98     Resp: 18     Temp: 97.9 F (36.6 C)     TempSrc: Oral     SpO2: 100% 100% 100%   Weight: 75 kg (165 lb 5.5 oz)     Height: 1.791 m (5' 10.5)           Medical Decision Making  Risk  Prescription drug management.      58 year old male presents with dental pain and concern for dental infection.  Gingival erythema and tenderness and swelling but no dental abscess amenable to ED drainage.  No evidence of Ludwig's angina.  Given dose of Augmentin in the ED and Rx course of same and treated with dental balls for pain.  Recommended dentistry follow-up for further evaluation as scheduled.  Return precautions were given for worsening or concerns.      REASSESSMENT          CONSULTS:  None    PROCEDURES:     Procedures            (Please note that portions of this note were completed with a voice recognition program.  Efforts were made to edit the dictations but occasionally words are mis-transcribed.)    Casper Clement, DO (electronically signed)  Emergency Attending Physician              Melany Spiro, DO  01/25/24 0214

## 2024-01-25 ENCOUNTER — Inpatient Hospital Stay
Admit: 2024-01-25 | Discharge: 2024-01-25 | Disposition: A | Discharge status: Home / Self Care | Payer: MEDICARE | Attending: Emergency Medicine

## 2024-01-25 MED ORDER — AMOXICILLIN-POT CLAVULANATE 875-125 MG PO TABS
875-125 | ORAL | Status: AC
Start: 2024-01-25 — End: 2024-01-24
  Administered 2024-01-25: 04:00:00 1 via ORAL

## 2024-01-25 MED ORDER — DIPHENHYDRAMINE HCL 12.5 MG/5ML PO LIQD
12.5 MG/5ML | Freq: Once | ORAL | Status: AC
Start: 2024-01-25 — End: 2024-01-24
  Administered 2024-01-25: 04:00:00

## 2024-01-25 MED ORDER — AMOXICILLIN-POT CLAVULANATE 875-125 MG PO TABS
875-125 | ORAL_TABLET | Freq: Two times a day (BID) | ORAL | 0 refills | 7.00000 days | Status: AC
Start: 2024-01-25 — End: 2024-01-31

## 2024-01-25 MED FILL — AMOXICILLIN-POT CLAVULANATE 875-125 MG PO TABS: 875-125 MG | ORAL | Qty: 1 | Fill #0

## 2024-01-25 MED FILL — LIDOCAINE VISCOUS HCL 2 % MT SOLN: 2 % | OROMUCOSAL | Qty: 15 | Fill #0

## 2024-04-10 NOTE — Telephone Encounter (Signed)
 Reason for call:  Disability requests office notes for the patient from the last year. Also, they request documentation for the Levothyroxine  medication that he takes.     *Please call pt when the papers are ready and he will personally pick them up in office.     Is this a new problem: No    Date of last appointment:  12/04/2023     Can we respond via MyChart: No    Best call back number:   [phone] [facility])

## 2024-04-11 NOTE — Telephone Encounter (Signed)
 Left vm for patient to contact the office regarding the paperwork he needs completed.

## 2024-05-30 NOTE — Progress Notes (Signed)
 "[name]   Pre-Transplant ANNUAL    PRIMARY CARE PROVIDER  Dyane Lonni BRAVO, MD    REFERRING PROVIDER  Alm Payment Celinda Dove    SITE OF VISIT  [facility]     CHIEF COMPLAINT  Kidney Transplant Annual Visit    HISTORY OF PRESENT ILLNESS  John Friedman is a 58 y.o. man? 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 John Friedman is currently ACTIVE on the transplant list with 36 months of wait time.  John Friedman is blood type O.     Interval History:   John Friedman is a 58 y.o. 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 John Friedman is currently ACTIVE on the transplant list with 36 months of wait time. John Friedman is blood type O positive.   .  The patient denies recent hospitalizations or significant illnesses. Specifically denies f/c/s, n/v, abd pain, diarrhea, and changes in bowel movements. No urinary sx.     Denies CP, SOB, and DOE. Can walk 4+ blocks on a flat surface and up one flight of stairs without dyspnea or other anginal symptoms. No claudication. Denies known personal history of MI, CHF, stroke, DM2, DVT/PE.    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      In last year discovered he had moderate OSA that he is successfully treated with a mouth guard.     Initiated HD 01/11/2019. Dialyzes [facility] [place], [place]. Nephrologist: Dr. Kristy. Sessions are MWF 3.5 hrs via a LUE AVG. UOP: maybe a cup a day.     Not using midodrine anymore. BP before dialysis are 115/70. No orthostasis on non-dialysis days    Accompanied by his sister today    Living donor: he  has a donor undergoing evaluation at [facility], connected through [email or web address]    Multi-listing: none    PMH:  Past Medical History:   Diagnosis Date    BPH (benign prostatic hyperplasia)     ESRD (end stage renal disease) on dialysis (CMS/HHS-HCC)     H/O testicular cancer     Hypothyroidism     Urinary obstruction        Sensitization history: No known h/o blood transfusions. No h/o SOT.       PAST SURGICAL HISTORY  Past Surgical History:   Procedure Laterality Date    CHOLECYSTECTOMY      Orchectomy         SOCIAL HISTORY  Social History     Tobacco Use    Smoking status: Never     Passive exposure: Never    Smokeless tobacco: Never   Substance Use Topics    Alcohol use: Never    Drug use: Never       FAMILY HISTORY  FH: family history reviewed and non-contributory      CURRENT MEDICATIONS  Outpatient Encounter Medications as of 05/30/2024   Medication Sig Dispense Refill    cholecalciferol (VITAMIN D3) 2,000 unit tablet Take 2,000 Units by mouth once daily      clomiPRAMINE (ANAFRANIL) 50 MG capsule 3 cap by mouth at bedtime      clonazePAM (KLONOPIN) 1 MG tablet 1 mg 2 (two) times  daily      doxercalciferol (HECTOROL IV) 4 mcg by IV Push route      FLUoxetine (PROZAC) 20 MG capsule Take 20 mg by mouth every morning      levothyroxine  (SYNTHROID ) 100 MCG tablet Take 100 mcg by mouth once daily      methoxy peg-epoetin beta (MIRCERA INJ) 30 mcg by IV Push route      b complex multivitamin (RENAL-VITE) 0.8 mg Take 1 tablet by mouth once daily (Patient not taking: Reported on 05/30/2024)      dietary supplement (PHOSPHATIDYL CHOLINE MIXTURE ORAL) Take by mouth Pt takes 2 tablespoons in the morning daily      levothyroxine  (SYNTHROID ) 75 MCG tablet Take 75 mcg by mouth once daily Take on an empty stomach with a glass of water at least 30-60 minutes before breakfast. (Patient not taking: Reported on 05/30/2024)      midodrine (PROAMATINE) 5 MG tablet Take 5 mg by mouth every Monday,  Wednesday, and Friday Takes at dialysis      propranoloL (INDERAL) 20 MG tablet Take 20 mg by mouth once daily      sevelamer carbonate (RENVELA) 800 mg tablet  (Patient not taking: Reported on 05/30/2024)       No facility-administered encounter medications on file as of 05/30/2024.       ALLERGIES  Allergies   Allergen Reactions    Latex Hives and Dermatitis     redness  CAUSES SKIN REDNESS       PHYSICAL EXAM          Constitutional                         General Appearance: Thin, chronically ill appearing male who appears older than his stated age.  NAD.                          Vitals                                BP 111/74 (BP Location: Right upper arm, Patient Position: Sitting, BP Cuff Size: Adult)   Pulse 85   Temp 36.3 C (97.4 F) (Oral)   Resp 16   Ht 178.5 cm (5' 10.28)   Wt 74.1 kg (163 lb 5.8 oz)   SpO2 100%   BMI 23.26 kg/m             Neurological                       Examination: Face symmetric, speech not dysarthric, moves all four extremities            Psychiatric                         Examination: The patient is awake, alert, and interactive                         Mood and Affect: The mood and affect are appropriate            Head and Face                         Examination: Normocephalic and atraumatic            Eyes  Examination: Sclera anicteric and conjunctiva clear            Ears, Nose, Mouth, and Throat                         Examination: Oropharynx clear, moist mucous membranes, teeth in good repair, no oral ulcers or thrush            Neck                        General Examination: The neck is supple, no cervical lymphadenopathy                        Examination of Jugular Veins: No JVD present            Respiratory                       Respiratory Effort: Normal work of breathing with no respiratory effort                       Auscultation of Lungs: Full and clear breath sounds bilaterally               Cardiovascular                         Auscultation of Heart: Regular rhythm, no murmur, rub or gallop                         Pulses: Normal distal pulses              Gastrointestinal                        Examination of Abdomen: Soft, non-tender, non-distended, with normoactive bowel sounds             Lymphatic                        Palpation: No cervical adenopathy             Musculoskeletal                        Examination: Normal bulk and tone                        Gait: The patient is able to stand easily. No assistance was needed on or off the exam table.              Extremities                       Examination: No pretibial, flank, or sacral edema             Skin                       Inspection: Warm, well-perfused for areas of exposed skin             Dialysis Access                       Examination: Left AUG + thrill and bruit      CONSULTATION AND COLLABORATION  I have reviewed information from the  referral, admission & discharge notes, last clinic notes, & other labs/studies.?      I have reviewed smoking history, BMI, age appropriate cancer screening, & immunization history.?     I have spoken with my transplant coordinator to review the work up.    I have discussed the patient details with my child psychotherapist, automotive engineer.        LABORATORY REVIEW  No visits with results within 1 Month(s) from this visit.   Latest known visit with results is:   Lab Requisition on 04/04/2023   Component Date Value Ref Range Status    PERCENT RELATIVE AB CLASS I 04/04/2023 0   Final    PERCENT RELATIVE AB CLASS II 04/04/2023 0   Final    DILUTION 04/04/2023 1:1   Final    METHOD AB 04/04/2023 Flow Cytometry   Final    TEST DATE  AB 04/04/2023 [id number]   Final    SAMPLE DATE 04/04/2023 [id number]   Final    Disclaimer #1 04/04/2023    Final                    Value:THE RESULTS REPORTED ARE BASED ON TESTING WITH A POOL OF BEADS COATED WITH CLASS I OR CLASS II MOLECULES FROM  DONORS REPRESENTING THE MOST COMMON HLA ANTIGENS COMMERCIALLY AVAILABLE. ANTIBODIES MAY BE PRESENT AT A LEVEL OF DETECTION BELOW THE SENSITIVITY   OF THE SOLID PHASE METHODOLOGY AND/OR AGAINST ANTIGENS NOT REPRESENTED BY THESE SCREENING BEADS. FDA DISCLAIMER: THE TEST PERFORMED HAS NOT BEEN CLEARED OR APPROVED BY THE U.S. FDA. IT WAS VALIDATED AND ITS PERFORMANCE CHARACTERISTICS WERE DETERMINED BY   THE CLINICAL TRANSPLANTATION IMMUNOLOGY LABORATORY AT [facility]. THIS LABORATORY IS CERTIFIED UNDER THE CLINICAL LABORATORY IMPROVEMENT AMENDMENTS OF 1988 (CLIA) AS QUALIFIED TO PERFORM HIGH COMPLEXITY CLINICAL TESTING.    CPRA CLASS I 04/04/2023 0   Final    SPEC CI AB 04/04/2023 Negative   Final    DILUTION 04/04/2023 1:1   Final    METHOD AB 04/04/2023 LUMINEX   Final    TEST DATE  AB 04/04/2023 [id number]   Final    SAMPLE DATE 04/04/2023 [id number]   Final    HLA COMMENTS 04/04/2023 Luminex SA Class I: SERUM EDTA TREATED.    SPECIFICITY TEST WAS PERFORMED TO CONFIRM ANTIBODY SCREENING TEST (PRA). NO ANTIBODY SPECIFICITY ABOVE CUTOFF WAS DETERMINED.   Final    Disclaimer #2 04/04/2023    Final                    Value:LUMINEX SINGLE ANTIGEN BEAD ASSAY (LSAB) WAS PERFORMED ON THIS SERUM SAMPLE TO DETERMINE THE PRESENCE OF HLA ANTIBODIES AND THEIR SPECIFICITIES. IT IS AS COMPLETE AND DEFINITIVE AS CURRENT TECHNOLOGY PROVIDES AND TESTS FOR ALL HLA ANTIGENS FOR WHICH   REAGENTS ARE COMMERCIALLY AVAILABLE. THE CURRENT STANDARD CUTOFF IS MFI=1,000. THE ASSAY IS NOT A QUANTITATIVE TEST. THE DETECTED ANTIBODY BINDING INTENSITY (MFI VALUE) MAY BE INFLUENCED BY TECHNICAL VARIATIONS.  FDA DISCLAIMER: THE TEST PERFORMED HAS   NOT BEEN CLEARED OR APPROVED BY THE U.S. FDA. IT WAS VALIDATED AND ITS PERFORMANCE CHARACTERISTICS WERE DETERMINED BY THE CLINICAL TRANSPLANTATION IMMUNOLOGY LABORATORY AT [facility]. THIS LABORATORY IS CERTIFIED UNDER THE CLINICAL   LABORATORY IMPROVEMENT  AMENDMENTS OF 1988 (CLIA) AS QUALIFIED TO PERFORM HIGH COMPLEXITY CLINICAL TESTING.    CPRA CLASS II 04/04/2023 0   Final    SPEC CII AB 04/04/2023 Negative  Final    DILUTION 04/04/2023 1:1   Final    METHOD AB 04/04/2023 LUMINEX   Final    TEST DATE  AB 04/04/2023 [id number]   Final    SAMPLE DATE 04/04/2023 [id number]   Final    HLA COMMENTS 04/04/2023 Luminex SA Class II: SERUM EDTA TREATED.   Final    Disclaimer #2 04/04/2023    Final                    Value:LUMINEX SINGLE ANTIGEN BEAD ASSAY (LSAB) WAS PERFORMED ON THIS SERUM SAMPLE TO DETERMINE THE PRESENCE OF HLA ANTIBODIES AND THEIR SPECIFICITIES. IT IS AS COMPLETE AND DEFINITIVE AS CURRENT TECHNOLOGY PROVIDES AND TESTS FOR ALL HLA ANTIGENS FOR WHICH   REAGENTS ARE COMMERCIALLY AVAILABLE. THE CURRENT STANDARD CUTOFF IS MFI=1,000. THE ASSAY IS NOT A QUANTITATIVE TEST. THE DETECTED ANTIBODY BINDING INTENSITY (MFI VALUE) MAY BE INFLUENCED BY TECHNICAL VARIATIONS.  FDA DISCLAIMER: THE TEST PERFORMED HAS   NOT BEEN CLEARED OR APPROVED BY THE U.S. FDA. IT WAS VALIDATED AND ITS PERFORMANCE CHARACTERISTICS WERE DETERMINED BY THE CLINICAL TRANSPLANTATION IMMUNOLOGY LABORATORY AT [facility]. THIS LABORATORY IS CERTIFIED UNDER THE CLINICAL   LABORATORY IMPROVEMENT AMENDMENTS OF 1988 (CLIA) AS QUALIFIED TO PERFORM HIGH COMPLEXITY CLINICAL TESTING.        ASSESSMENT AND PLAN  John Friedman is a 58 y.o. 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 He remains eligible for active listing.     1. Cardiovascular disease screening: good exercise capacity   - will need echo and stress testing q 2 years and f/up studies from today   - Imaging per Transplant surgery  2. Age appropriate cancer screening:    - will need repeat colonoscopy by  summer 2026   - will need PSA annually  3. Recommend immunization for VZV and updated COVID vaccine  4. Dental: adequate dentition, will not require dental clearance  5. IS: pt wishes to be considered for rapa based IS regimen due to his research suggesting less risk of chronic fatigue syndrome, suspect he would be eligible for rapa/bela and advised that implementing this would be facilitated if LD identified.   6. Will obtain routine viral serologies, blood and HLA typing, and PRA  7. The is blood type O and discussed usual wait times of 6-8 years for deceased donor transplant for patients in our area. Counseled patient on benefits of living donation including decreased wait time and overall improved patient and allograft outcomes. Discussed option of paired donor exchange should a medically appropriate donor be identified. Discussed living donation at length. He stopped looking for donors after someone told him they were undergoing evaluation at [facility] after meeting via [email or web address]. Discussed reputable organizations for finding living donors including [employer] and associated [employer]. Information provided and pt will resume looking for living donors though he remains hopeful the current donor in evaluation will be successful.        The benefits and risks of transplantation were discussed in detail with the patient including the potential for peri-operative complications of, but not limited to, cardiac events, infection, poor wound healing, need for return to the operating room or re-hospitalization, and a small risk of death. The risks of immunosuppressive medications including potential high cost and increased risk of infection and malignancy were discussed. The patient  was advised that more than 100,000 people are currently awaiting kidney transplant in the [place]  and receiving a transplant should be considered a true gift. The patient expressed understanding and all questions were answered.        Sharman Rather, MD  Transplant Nephrology  [facility]         I spent a total of 42 minutes in both face-to-face and non-face-to-face activities, excluding procedures performed, for this visit on the date of this encounter.          Future Appointments  Future Appointments       Date/Time Provider Department Center Visit Type    05/30/2024 11:00 AM (Arrive by 10:30 AM) Leonce Delon Helling, LCSW [name] TRANSPLANT SOCIAL WORK    05/30/2024 11:30 AM (Arrive by 11:00 AM) KIDNEY TXP FCC 2 [name] TRANSPLANT [facility]    05/30/2024 12:00 PM (Arrive by 11:30 AM) LAB DRAW, 2B2C [name] 2C Lab [name]    05/30/2024 1:00 PM (Arrive by 12:30 PM) [name] ECHO RM 4 [name] ECHO COMPLETE    05/30/2024 3:00 PM (Arrive by 2:40 PM) CC [facility]  CT Cancer C[facility] CT [facility] DUAL ABD PEL WWO              Attestation Statement:     I personally performed the service. (TP)    SHARMAN EARNIE RATHER, MD    "

## 2024-06-20 ENCOUNTER — Emergency Department: Admit: 2024-06-20 | Payer: MEDICARE | Primary: Internal Medicine

## 2024-06-20 ENCOUNTER — Inpatient Hospital Stay
Admit: 2024-06-20 | Discharge: 2024-06-20 | Disposition: A | Discharge status: Home / Self Care | Payer: MEDICARE | Arrived: VH | Attending: Student in an Organized Health Care Education/Training Program

## 2024-06-20 DIAGNOSIS — S62115A Nondisplaced fracture of triquetrum [cuneiform] bone, left wrist, initial encounter for closed fracture: Principal | ICD-10-CM

## 2024-06-20 DIAGNOSIS — S62112A Displaced fracture of triquetrum [cuneiform] bone, left wrist, initial encounter for closed fracture: Secondary | ICD-10-CM

## 2024-06-20 MED ORDER — ACETAMINOPHEN 500 MG PO TABS
500 | ORAL_TABLET | Freq: Four times a day (QID) | ORAL | 0 refills | Status: AC | PRN
Start: 2024-06-20 — End: 2024-06-30

## 2024-06-20 MED ORDER — OXYCODONE HCL 5 MG PO TABS
5 | ORAL_TABLET | Freq: Three times a day (TID) | ORAL | 0 refills | Status: AC | PRN
Start: 2024-06-20 — End: 2024-06-25

## 2024-06-20 NOTE — ED Notes (Signed)
"  I have reviewed discharge instructions with the patient. The patient was educated to not drive after taking prescribed oxycodone  due to side effect of drowsiness. The patient verbalized understanding.    "

## 2024-06-20 NOTE — ED Triage Notes (Signed)
"  Patient arrives ambulatory. States that yesterday he stood on a swivel chair and fell onto his left wrist. Swelling noted to left wrist, pain with movement and rest. States he hit the right side of his chest when he fell. Reports discomfort to area, no bruising or SOB. Denies head injury or thinners.   "

## 2024-06-20 NOTE — Discharge Instructions (Addendum)
"  You presented to the ED with left wrist pain after a fall.  X-ray shows a avulsion fracture of the triquetrum.  You have good sensation in your fingers and hand.  Splint was applied.  Recommend taking Tylenol  and NSAIDs for pain.  Follow-up with hand specialist.  Information and discharge paperwork.  "

## 2024-06-20 NOTE — ED Provider Notes (Signed)
 "     EMERGENCY DEPARTMENT PHYSICIAN NOTE     Patient: John Friedman     Time of Service: 06/20/2024  8:25 AM     Chief complaint:   Chief Complaint   Patient presents with    Wrist Pain        HISTORY:  Patient is a 58 y.o. male who presents to the emergency department with complaints of fall with FOOSH type injury to left wrist.  Also some mild trauma to his right chest.  Will x-ray left wrist.  However patient does not feel x-ray of the chest is necessary.  Hand neurovascular intact.  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    Lumbar radiculopathy 08/10/2012    S/p steroid injection by Dr Darra 08/01/12     Normal cardiac stress test 09/05/11    Pleural effusion, bilateral 01/21/2019    Pulmonary edema 01/21/2019    Sleep disturbances     acting out in his sleep    Thrombocytopenia 01/21/2019        Past Surgical History:   Procedure Laterality Date    AV FISTULA CREATION Left 07/24/2020    left AVF    CHOLECYSTECTOMY  12/05/2011    abnormal HIDA scan (EF < 2%)    COLONOSCOPY  07/12/2010    hyperplastic polyp, repeat 10 yrs    COLONOSCOPY  02/23/2015    normal    CYST REMOVAL Right 01/06/2016    3rd toe ganglion cyst removal - podiatry (Dr MARLA)    HEENT Left 06/2012    tonsilar bx: benign tissue (Dr Faythe)    ORTHOPEDIC SURGERY      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2    OTHER SURGICAL HISTORY  11/26/2020    L stereotactic focused US  thalamotomy for ET    OTHER SURGICAL HISTORY  08/2022    R stereotactic  focused US  thalamotomy for ET    SHOULDER ARTHROSCOPY Right 08/13/2018    TESTICLE REMOVAL Left 2002    TURP  10/28/2020    @ [name]    UPPER GASTROINTESTINAL ENDOSCOPY  09/09/2011    EGD- normal        Family History   Problem Relation Age of Onset    No Known Problems Mother     Parkinsonism Father     Osteoarthritis Father         scoliosis    Dementia Father     Cancer Paternal Grandfather         prostate    No Known Problems Brother     No Known Problems Brother     Cancer Maternal Grandfather         prostate    No Known Problems Sister  Social History     Socioeconomic History    Marital status: Single     Spouse name: None    Number of children: None    Years of education: None    Highest education level: None   Tobacco Use    Smoking status: Never    Smokeless tobacco: Never   Substance and Sexual Activity    Alcohol use: Not Currently    Drug use: No    Sexual activity: Not Currently     Social Drivers of Health     Financial Resource Strain: Low Risk  (11/29/2022)    Overall Financial Resource Strain (CARDIA)     Difficulty of Paying Living Expenses: Not hard at all   Food Insecurity: No Food Insecurity (12/04/2023)    Hunger Vital Sign     Worried About Running Out of Food in the Last Year: Never true     Ran Out of Food in the Last Year: Never true   Transportation Needs: No Transportation Needs (12/04/2023)    PRAPARE - Therapist, Art (Medical): No     Lack of Transportation (Non-Medical): No   Physical Activity: Inactive (11/30/2023)    Exercise Vital Sign     Days of Exercise per Week: 0 days     Minutes of Exercise per Session: 0 min   Housing Stability: Low Risk  (12/04/2023)    Housing Stability Vital Sign     Unable to Pay for Housing in the Last Year: No     Number of Times Moved in the Last Year: 0     Homeless in the Last Year: No        Current Medications: Reviewed in chart.    Allergies:   Allergies   Allergen Reactions    Latex Other (See Comments)     redness           REVIEW OF SYSTEMS: See HPI for pertinent positives and negatives.      PHYSICAL EXAM:  BP 119/84   Pulse 76   Temp 98.2 F (36.8 C) (Oral)   Resp 16   Wt 76.9 kg (169 lb 8.5 oz)   SpO2 98%   BMI 23.98 kg/m    Physical Exam  Constitutional:       Appearance: Normal appearance.   HENT:      Head: Normocephalic and atraumatic.      Right Ear: External ear normal.      Left Ear: External ear normal.      Nose: Nose normal.   Eyes:      Conjunctiva/sclera: Conjunctivae normal.   Cardiovascular:      Rate and Rhythm: Normal rate.   Pulmonary:      Effort: Pulmonary effort is normal.   Musculoskeletal:         General: Swelling, tenderness and signs of injury present. Normal range of motion.   Skin:     Coloration: Skin is not jaundiced.   Neurological:      General: No focal deficit present.      Mental Status: He is alert and oriented to person, place, and time.   Psychiatric:         Mood and Affect: Mood normal.         Behavior: Behavior normal.           ED Course:    ED Course as of 06/21/24 1714   Thu Jun 20, 2024   0854  XR WRIST LEFT (MIN 3 VIEWS)  No displaced fractures on my review of imaging.  Formal read pending [AL]   1001 Splint on left wrist assessed.  Hand neurovascularly intact.  Some pain still present.  Short course of narcotics prescribed.  Tylenol  recommended as well. [AL]      ED Course User Index  [AL] Lindsay Cornet, MD         Imaging Results:  XR WRIST LEFT (MIN 3 VIEWS)   Final Result   Acute triquetral avulsion fracture with associated dorsal soft   tissue swelling. 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      Electronically signed by Norleen JONETTA Smalls        ED physician interpretation of imaging: Documented in ED course    Medications Given:  Medications - No data to display    Differential Diagnosis included but not limited to: Wrist fracture, hand fracture    Medical Decision Making  Patient is a 58 year old  male presented the ED with bruise type injury to the left wrist.  Has 2 Accretropin fracture.  Volar splint placed.  Oral narcotic medication prescribed for breakthrough pain.  Patient discharged home with recommendations for outpatient follow-up with hand surgery.    Of note patient has fistula left arm but splint is well below fistula.    Amount and/or Complexity of Data Reviewed  Radiology: ordered. Decision-making details documented in ED Course.    Risk  OTC drugs.  Prescription drug management.          Procedures       DISPOSITION: Decision To Discharge 06/20/2024 10:44:08 AM    CLINICAL IMPRESSION:   1. Closed nondisplaced fracture of triquetrum of left wrist, initial encounter         Further personalized recommendations for outpatient care as below.    Key discharge instructions and summary of care provided in AVS: You presented to the ED with left wrist pain after a fall.  X-ray shows a avulsion fracture of the triquetrum.  You have good sensation in your fingers and hand.  Splint was applied.  Recommend taking Tylenol  and NSAIDs for pain.  Follow-up with hand specialist.  Information and discharge paperwork.    The patient has been re-evaluated and feeling better. Patient is stable for discharge.  All available radiology and laboratory results have been reviewed with patient and/or available family.  Patient and/or family verbally conveyed their understanding and agreement of the patient's signs, symptoms, diagnosis, treatment and prognosis and additionally agree to follow-up as recommended in the discharge instructions or to return to the Emergency Department should their condition change or worsen prior to their follow-up appointment.  All questions have been answered and patient and/or available family express understanding.      Prescriptions provided to the patient:     Discharge Medication List as of 06/20/2024 10:00 AM        START taking these medications    Details   acetaminophen  (TYLENOL ) 500  MG tablet Take 2 tablets by mouth every 6 hours as needed for Pain, Disp-80 tablet, R-0Normal      oxyCODONE  (ROXICODONE ) 5 MG immediate release tablet Take 1 tablet by mouth every 8 hours as needed for Pain for up to 5 days. Intended supply: 5 days. Take lowest dose possible to manage pain Max Daily Amount: 15 mg, Disp-10 tablet, R-0Normal  Juliene Gasmen, MD   Emergency Medicine Attending Physician     Gasmen Juliene, MD  06/21/24 [phone]    "

## 2024-06-20 NOTE — ED Notes (Signed)
"  Left wrist, hand, and forearm socked and padded; vulnar splint applied.  PMS intact prior to and after application.  "

## 2024-06-21 ENCOUNTER — Inpatient Hospital Stay
Admit: 2024-06-21 | Discharge: 2024-06-22 | Disposition: A | Discharge status: Home / Self Care | Payer: MEDICARE | Arrived: WI | Attending: Student in an Organized Health Care Education/Training Program

## 2024-06-21 ENCOUNTER — Emergency Department: Admit: 2024-06-21 | Payer: MEDICARE | Primary: Internal Medicine

## 2024-06-21 DIAGNOSIS — T829XXA Unspecified complication of cardiac and vascular prosthetic device, implant and graft, initial encounter: Secondary | ICD-10-CM

## 2024-06-21 DIAGNOSIS — T82898A Other specified complication of vascular prosthetic devices, implants and grafts, initial encounter: Principal | ICD-10-CM

## 2024-06-21 LAB — CBC WITH AUTO DIFFERENTIAL
Basophils %: 0.6 % (ref 0.0–1.0)
Basophils Absolute: 0.04 K/UL (ref 0.00–0.10)
Eosinophils %: 2.6 % (ref 0.0–7.0)
Eosinophils Absolute: 0.16 K/UL (ref 0.00–0.40)
Hematocrit: 33.8 % — ABNORMAL LOW (ref 36.6–50.3)
Hemoglobin: 11.3 g/dL — ABNORMAL LOW (ref 12.1–17.0)
Immature Granulocytes %: 0.6 % — ABNORMAL HIGH (ref 0.0–0.5)
Immature Granulocytes Absolute: 0.04 K/UL (ref 0.00–0.04)
Lymphocytes %: 15.2 % (ref 12.0–49.0)
Lymphocytes Absolute: 0.94 K/UL (ref 0.80–3.50)
MCH: 32.7 pg (ref 26.0–34.0)
MCHC: 33.4 g/dL (ref 30.0–36.5)
MCV: 97.7 FL (ref 80.0–99.0)
MPV: 10.5 FL (ref 8.9–12.9)
Monocytes %: 9.6 % (ref 5.0–13.0)
Monocytes Absolute: 0.59 K/UL (ref 0.00–1.00)
Neutrophils %: 71.4 % (ref 32.0–75.0)
Neutrophils Absolute: 4.4 K/UL (ref 1.80–8.00)
Nucleated RBCs: 0 /100{WBCs}
Platelets: 176 K/uL (ref 150–400)
RBC: 3.46 M/uL — ABNORMAL LOW (ref 4.10–5.70)
RDW: 13.7 % (ref 11.5–14.5)
WBC: 6.2 K/uL (ref 4.1–11.1)
nRBC: 0 K/uL (ref 0.00–0.01)

## 2024-06-21 LAB — BASIC METABOLIC PANEL
Anion Gap: 15 mmol/L — ABNORMAL HIGH (ref 2–14)
BUN/Creatinine Ratio: 9 — ABNORMAL LOW (ref 12–20)
BUN: 51 mg/dL — ABNORMAL HIGH (ref 8–23)
CO2: 25 mmol/L (ref 20–29)
Calcium: 9.5 mg/dL (ref 8.8–10.2)
Chloride: 94 mmol/L — ABNORMAL LOW (ref 98–107)
Creatinine: 5.91 mg/dL — ABNORMAL HIGH (ref 0.70–1.20)
Est, Glom Filt Rate: 10 ml/min/1.73m2 — ABNORMAL LOW (ref >59)
Glucose: 71 mg/dL (ref 65–100)
Potassium: 4.2 mmol/L (ref 3.5–5.1)
Sodium: 135 mmol/L — ABNORMAL LOW (ref 136–145)

## 2024-06-21 LAB — EXTRA TUBES HOLD

## 2024-06-21 NOTE — Consults (Signed)
"  Consult received for dialysis  Discussed w/ ED provider.  Patient ESRD MWF. Dr. Kristy, last dialyzed w/ [facility] Monday  Presents to ED to evaluate AV fistula malfunction  Labs/electrolytes stable, CXR pending.  Vascular consulted, consider temp quinton if emergent HD needed. If volume status ok will plan for routine eval tomorrow/HD tomorrow.    Full consult to follow in AM.    Rexene Castor, NP  [facility]  "

## 2024-06-21 NOTE — ED Provider Notes (Signed)
 "    EMERGENCY DEPARTMENT PHYSICIAN NOTE     Patient: KEYAN FOLSON     Time of Service: 06/21/2024  5:34 PM     Chief complaint:   Chief Complaint   Patient presents with    Vascular Access Problem        HISTORY:  Patient is a 58 y.o. male who presents to the emergency department with complaints of nonfunctional left upper extremity fistula for dialysis.  ED received call from dialysis center stating patient had splint placed last night at [facility].  I saw this patient last had [facility] and he had a triquetrum fracture requiring volar splint placement.  At that time it was well below the fistula.  As the vascular surgery clinic is closed patient was sent to the ED here for further evaluation.      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    Lumbar radiculopathy 08/10/2012    S/p steroid injection by Dr Darra 08/01/12     Normal cardiac stress test 09/05/11    Pleural effusion, bilateral 01/21/2019    Pulmonary edema 01/21/2019    Sleep disturbances     acting out in his sleep    Thrombocytopenia 01/21/2019        Past Surgical History:   Procedure Laterality Date    AV FISTULA CREATION Left 07/24/2020    left AVF    CHOLECYSTECTOMY  12/05/2011    abnormal HIDA scan (EF < 2%)    COLONOSCOPY  07/12/2010    hyperplastic polyp, repeat 10 yrs    COLONOSCOPY  02/23/2015    normal    CYST REMOVAL Right 01/06/2016    3rd toe ganglion cyst removal - podiatry (Dr MARLA)    HEENT Left 06/2012    tonsilar bx: benign tissue (Dr Faythe)     ORTHOPEDIC SURGERY      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29    OTHER SURGICAL HISTORY  11/26/2020    L stereotactic focused US  thalamotomy for ET    OTHER SURGICAL HISTORY  08/2022    R stereotactic focused US  thalamotomy for ET    SHOULDER ARTHROSCOPY Right 08/13/2018    TESTICLE REMOVAL Left 2002    TURP  10/28/2020    @ [name]    UPPER GASTROINTESTINAL ENDOSCOPY  09/09/2011    EGD- normal        Family History   Problem Relation Age of Onset    No Known Problems Mother     Parkinsonism Father     Osteoarthritis Father         scoliosis    Dementia Father     Cancer Paternal Grandfather         prostate    No Known Problems Brother     No Known Problems  Brother     Cancer Maternal Grandfather         prostate    No Known Problems Sister         Social History     Socioeconomic History    Marital status: Single   Tobacco Use    Smoking status: Never    Smokeless tobacco: Never   Substance and Sexual Activity    Alcohol use: Not Currently    Drug use: No    Sexual activity: Not Currently     Social Drivers of Health     Financial Resource Strain: Low Risk  (11/29/2022)    Overall Financial Resource Strain (CARDIA)     Difficulty of Paying Living Expenses: Not hard at all   Food Insecurity: No Food Insecurity (12/04/2023)    Hunger Vital Sign     Worried About Running Out of Food in the Last Year: Never true     Ran Out of Food in the Last Year: Never true   Transportation Needs: No Transportation Needs (12/04/2023)    PRAPARE - Therapist, Art (Medical): No     Lack of Transportation (Non-Medical): No   Physical Activity: Inactive (11/30/2023)    Exercise Vital Sign     Days of Exercise per Week: 0 days     Minutes of Exercise per Session: 0 min   Housing Stability: Low Risk  (12/04/2023)    Housing Stability Vital Sign     Unable to Pay for Housing in the Last Year: No     Number of Times Moved in the Last Year: 0     Homeless in the Last Year: No        Current Medications: Reviewed in  chart.    Allergies:   Allergies   Allergen Reactions    Latex Other (See Comments)     redness          REVIEW OF SYSTEMS: See HPI for pertinent positives and negatives.      PHYSICAL EXAM:  BP (!) 141/84   Pulse 88   Temp 97.8 F (36.6 C) (Temporal)   Resp 18   Ht 1.791 m (5' 10.5)   Wt 78 kg (171 lb 15.3 oz)   SpO2 95%   BMI 24.32 kg/m    Physical Exam  Constitutional:       Appearance: Normal appearance.   HENT:      Head: Normocephalic and atraumatic.      Right Ear: External ear normal.      Left Ear: External ear normal.      Nose: Nose normal.   Eyes:      Conjunctiva/sclera: Conjunctivae normal.   Cardiovascular:      Rate and Rhythm: Normal rate.      Comments: Left AV fistula appears to not have flow on palpation  Pulmonary:      Effort: Pulmonary effort is normal.   Musculoskeletal:         General: Normal range of motion.   Skin:     Coloration: Skin is not jaundiced.   Neurological:      General: No focal deficit present.      Mental Status: He is alert and oriented to person, place, and time.   Psychiatric:         Mood and Affect: Mood normal.         Behavior: Behavior normal.  ED Course:    ED Course as of 06/21/24 2357   Fri Jun 21, 2024   1804 WBC: 6.2 [AL]   1805 Hemoglobin Quant(!): 11.3 [AL]   1805 Platelet Count: 176 [AL]   1820 Potassium: 4.2 [AL]   1820 Creatinine(!): 5.91  ESRD on hemodialysis [AL]   1845 XR CHEST (2 VW)  clear lungs, no overt pulmonary edema or pneumothorax.  Formal read pending [AL]      ED Course User Index  [AL] Lindsay Cornet, MD         Laboratory Results:  Labs Reviewed   CBC WITH AUTO DIFFERENTIAL - Abnormal; Notable for the following components:       Result Value    RBC 3.46 (*)     Hemoglobin 11.3 (*)     Hematocrit 33.8 (*)     Immature Granulocytes % 0.6 (*)     All other components within normal limits   BASIC METABOLIC PANEL - Abnormal; Notable for the following components:    Sodium 135 (*)     Chloride 94 (*)     Anion Gap 15 (*)     BUN  51 (*)     Creatinine 5.91 (*)     BUN/Creatinine Ratio 9 (*)     Est, Glom Filt Rate 10 (*)     All other components within normal limits   EXTRA TUBES HOLD     ED physician interpretation of laboratory results: Documented in ED course    Imaging Results:  XR CHEST (2 VW)   Final Result      No evidence for pulm edema. Chronic medial segment right middle lobe   atelectasis.         Electronically signed by Alm Edman, MD        ED physician interpretation of imaging: Documented in ED course    Medications Given:  Medications - No data to display    Differential Diagnosis included but not limited to: AV fistula clot, volume overload, electrolyte abnormality from missed dialysis    Medical Decision Making  Patient is a 58 year old male presenting to the ED with hemodialysis fistula site complication.  No thrill or flow per dialysis center.  Sent to ED for evaluation.  Contacted vascular surgery team as well as nephrology team.  As patient is stable with no signs of volume overload, vital signs abnormalities, electrolyte abnormality patient is stable for discharge home.  Vascular surgery can see him in their center on [address] at 7 AM on Monday he can walk and get treated.  He can then go to his dialysis in the evening on Monday.  Patient's nephrology team agrees with this plan.  Patient to return precautions if he is feeling worse or if feel he is having trouble breathing.  Patient stable at this time for discharge.    Formal volar splint was removed and removable thumb spica was placed.  Patient has follow-up for his triquetral fracture on Wednesday.    Amount and/or Complexity of Data Reviewed  Labs: ordered. Decision-making details documented in ED Course.  Radiology: ordered. Decision-making details documented in ED Course.          Procedures       DISPOSITION: Decision To Discharge 06/21/2024 07:55:19 PM    CLINICAL IMPRESSION:   1. AV fistula occlusion, initial encounter    2. Closed fracture of  left wrist, initial encounter         Further personalized recommendations for  outpatient care as below.    Key discharge instructions and summary of care provided in AVS: You presented to the ED with problems with her vascular access.  This may or may not have been due to splint placement.  Splint was taken down and thumb spica was placed.  Follow-up with orthopedic surgery as planned on Wednesday.    For your vascular access go to the [facility] of vascular center at [address] at 7 AM on Monday.  Walk writing and they will get you taken care of.  Then go to dialysis as planned.    If symptoms change or worsen you have shortness of your shortness of breath or feel you are getting sicker and need immediate dialysis return to ED.    The patient has been re-evaluated and feeling better. Patient is stable for discharge.  All available radiology and laboratory results have been reviewed with patient and/or available family.  Patient and/or family verbally conveyed their understanding and agreement of the patient's signs, symptoms, diagnosis, treatment and prognosis and additionally agree to follow-up as recommended in the discharge instructions or to return to the Emergency Department should their condition change or worsen prior to their follow-up appointment.  All questions have been answered and patient and/or available family express understanding.      Prescriptions provided to the patient:     Discharge Medication List as of 06/21/2024  8:00 PM           Juliene Gasmen, MD   Emergency Medicine Attending Physician           Gasmen Juliene, MD  06/21/24 2359    "

## 2024-06-21 NOTE — ED Triage Notes (Signed)
"  Pt has splint from wrist fx from to left wrist placed yesterday. Pt went to dialysis today and they were unable to use graft. Pt c/o increased weight (3 kg)    Denies CP/denies SOB  "

## 2024-06-21 NOTE — Discharge Instructions (Addendum)
"  You presented to the ED with problems with her vascular access.  This may or may not have been due to splint placement.  Splint was taken down and thumb spica was placed.  Follow-up with orthopedic surgery as planned on Wednesday.    For your vascular access go to the [facility] of vascular center at [address] at 7 AM on Monday.  Walk writing and they will get you taken care of.  Then go to dialysis as planned.    If symptoms change or worsen you have shortness of your shortness of breath or feel you are getting sicker and need immediate dialysis return to ED.  "

## 2024-06-25 ENCOUNTER — Ambulatory Visit: Admit: 2024-06-25 | Discharge: 2024-06-25 | Payer: MEDICARE | Attending: Medical | Primary: Internal Medicine

## 2024-06-25 VITALS — Ht 70.5 in | Wt 165.3 lb

## 2024-06-25 DIAGNOSIS — S62112A Displaced fracture of triquetrum [cuneiform] bone, left wrist, initial encounter for closed fracture: Principal | ICD-10-CM

## 2024-06-25 NOTE — Progress Notes (Signed)
 "HPI: John Friedman (DOB: February 24, 1966) is a 58 y.o. male, patient, here for evaluation of the following chief complaint(s): Patient presents with complaint of left wrist pain.  On 06/19/2024, he was standing on a swivel chair at home dealing with his smoke detector when he fell landing on his left outstretched hand onto some wood flooring.  He presented to the ED where he underwent x-rays of the left wrist which revealed a triquetral chip fracture. He was immobilized in a thumb spica Velcro strap wrist brace.  Patient undergoes dialysis and after the injury, the left upper extremity fistula for dialysis treatment was nonfunctional.  He presented to the ED on 06/21/2024 with this issue.  An appointment was made for 06/24/24 to see the vascular surgeon for repair which he did and is doing well with that.  He denies hand paresthesias.  No other complaints or concerns.  X-rays of the left wrist available for review in PACS.    Wrist Pain (Fall to left wrist on 06/19/24. Standing on swivel chair to change smoke detector. Went to [place] ED where x-rays were taken and splint applied. On dialysis, port on left arm. Denies numbness, tingling. Right hand dominant. )       Vitals:  Ht 1.791 m (5' 10.5)   Wt 75 kg (165 lb 5.5 oz)   BMI 23.39 kg/m    Body mass index is 23.39 kg/m.    Allergies   Allergen Reactions    Latex Rash     redness       Current Outpatient Medications   Medication Sig Dispense Refill    Cholecalciferol (VITAMIN D-3 PO) Take by mouth      acetaminophen  (TYLENOL ) 500 MG tablet Take 2 tablets by mouth every 6 hours as needed for Pain 80 tablet 0    levothyroxine  (SYNTHROID ) 100 MCG tablet Take 1 tablet by mouth Daily 90 tablet 3    clomiPRAMINE (ANAFRANIL) 50 MG capsule Take 3 capsules by mouth      clonazePAM (KLONOPIN) 1 MG tablet Take 1 tablet by mouth 2 times daily.      FLUoxetine (PROZAC) 20 MG capsule Take 1 capsule by mouth daily       No current facility-administered medications for this  visit.       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    Lumbar radiculopathy 08/10/2012    S/p steroid injection by Dr Darra 08/01/12     Normal cardiac stress test 09/05/11    Pleural effusion, bilateral 01/21/2019    Pulmonary edema 01/21/2019    Sleep disturbances     acting out in his sleep    Thrombocytopenia 01/21/2019        Past Surgical History:   Procedure Laterality Date    AV FISTULA CREATION Left 07/24/2020  left AVF    CHOLECYSTECTOMY  12/05/2011    abnormal HIDA scan (EF < 2%)    COLONOSCOPY  07/12/2010    hyperplastic polyp, repeat 10 yrs    COLONOSCOPY  02/23/2015    normal    CYST REMOVAL Right 01/06/2016    3rd toe ganglion cyst removal - podiatry (Dr MARLA)    HEENT Left 06/2012    tonsilar bx: benign tissue (Dr Faythe)    ORTHOPEDIC SURGERY      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21    OTHER SURGICAL HISTORY  11/26/2020    L stereotactic focused US  thalamotomy for ET    OTHER SURGICAL HISTORY  08/2022    R stereotactic focused US  thalamotomy for ET    SHOULDER ARTHROSCOPY Right 08/13/2018    TESTICLE REMOVAL Left 2002    TURP  10/28/2020    @ [name]    UPPER GASTROINTESTINAL ENDOSCOPY  09/09/2011    EGD- normal       Family History   Problem Relation Age of Onset    No Known Problems Mother     Parkinsonism Father     Osteoarthritis Father         scoliosis    Dementia Father     Cancer Paternal Grandfather         prostate    No Known Problems Brother     No Known Problems Brother     Cancer Maternal Grandfather          prostate    No Known Problems Sister         Social History     Tobacco Use    Smoking status: Never    Smokeless tobacco: Never   Substance Use Topics    Alcohol use: Not Currently    Drug use: No        Review of Systems    Constitutional: No fevers, chills, night sweats, excessive fatigue or weight loss.  Musculoskeletal: + Left wrist injury  Neurologic: No headache, blurred vision, and no areas of focal weakness or numbness. Normal gait. No sensory problems.  Respiratory: No dyspnea on exertion, orthopnea, chest pain, cough or hemoptysis.  Cardiovascular: No anginal chest pain, irregular heart beat, tachycardia, palpitations or orthopnea  Integumentary: No chronic rashes, inflammation, ulcerations, pruritus, petechiae, purpura, ecchymoses, or skin changes    In the  Physical Exam    General: Alert, cooperative, no distress  Musculosketal: Left hand - Full range of motion.  Normal sensation.  No erythema, edema or warmth to the joints.  No cysts.  Light ecchymosis at the volar aspect.  Skin intact.  Left wrist - Full range of motion.  Normal sensation.  No erythema, edema or warmth to the joints.  No palpable cysts.  Tenderness to palpation at the fourth dorsal compartment.  Mild swelling.  Skin intact.  Neurologic:  CNII-XII intact, Normal strength, sensation, and reflexes throughout    XR WRIST LEFT (MIN 3 VIEWS)     INDICATION: wrist injury.     COMPARISON: None.     FINDINGS: Three views of the left wrist demonstrate triquetral avulsion fracture  with overlying soft tissue swelling. There is extensive subchondral cystic  change of the distal pole of the scaphoid with associated osteoarthritic change.  No other identified fracture or other acute osseous or articular abnormality.  The soft tissues are within normal limits.     IMPRESSION:  Acute triquetral avulsion fracture with associated dorsal soft  tissue swelling.  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     Electronically signed by Norleen JONETTA Smalls    ASSESSMENT/PLAN:  Below is the assessment and plan developed based on review of pertinent history, physical exam, labs, studies, and medications.    Left wrist pain.  On 06/19/2024, he was standing on a swivel chair at home dealing with his smoke detector when he fell landing on his left outstretched hand onto some wood flooring.  He presented to the ED where he underwent x-rays of the left wrist which revealed a triquetral chip fracture. He was immobilized in a thumb spica Velcro strap wrist brace.  Patient undergoes dialysis and after the injury, the left upper extremity fistula for dialysis treatment was nonfunctional.  He presented to the ED on 06/21/2024 with this issue.  An appointment was made for 06/24/24 to see the vascular surgeon for repair which he did and is doing well with that.left wrist pain.  On 06/19/2024, he was standing on a swivel chair at home dealing with his smoke detector when he fell landing on his left outstretched hand onto some wood flooring.  He presented to the ED where he underwent x-rays of the left wrist which revealed a triquetral chip fracture. He was immobilized in a thumb spica Velcro strap wrist brace.  Patient undergoes dialysis and after the injury, the left upper extremity fistula for dialysis treatment was nonfunctional.  He presented to the ED on 06/21/2024 with this issue.  An appointment was made for 06/24/24 to see the vascular surgeon for repair which he did and is doing well with that.  Outside images and notes independently reviewed and interpreted by myself.  Clinical exam reveals a left wrist triquetral chip fracture.  He was transitioned to a Velcro strap wrist brace without the thumb for ease of ADLs.  He will follow-up in the office in 3 weeks to review his progress and repeat x-rays.  Return sooner as needed.      1. Triquetral chip fracture, left, closed, initial encounter  -     Ambulatory  Referral to DME  -     [facility] Comfort Form Wrist Brace      Return in about 3 weeks (around 07/16/2024).     Dr. Matt was available for immediate consult during this encounter.  An electronic signature was used to authenticate this note.  -- Corean Merck, PA-C   "

## 2024-07-16 ENCOUNTER — Ambulatory Visit: Admit: 2024-07-16 | Discharge: 2024-07-16 | Payer: MEDICARE | Attending: Medical | Primary: Internal Medicine

## 2024-07-16 ENCOUNTER — Ambulatory Visit: Admit: 2024-07-16 | Payer: MEDICARE | Primary: Internal Medicine

## 2024-07-16 VITALS — Ht 70.5 in | Wt 165.0 lb

## 2024-07-16 DIAGNOSIS — S62112A Displaced fracture of triquetrum [cuneiform] bone, left wrist, initial encounter for closed fracture: Principal | ICD-10-CM

## 2024-07-16 NOTE — Progress Notes (Signed)
 "HPI: John Friedman (DOB: 1966/03/19) is a 58 y.o. male, patient, here for evaluation of the following chief complaint(s):Patient presents with complaint of left wrist pain.  On 06/19/2024, he was standing on a swivel chair at home dealing with his smoke detector when he fell landing on his left outstretched hand onto some wood flooring.  He presented to the ED where he underwent x-rays of the left wrist which revealed a triquetral chip fracture. He was immobilized in a thumb spica Velcro strap wrist brace.  Patient undergoes dialysis and after the injury, the left upper extremity fistula for dialysis treatment was nonfunctional.  He presented to the ED on 06/21/2024 with this issue.  An appointment was made for 06/24/24 to see the vascular surgeon for repair which he did and is doing well with that.  He denies hand paresthesias.  No other complaints or concerns.  X-rays of the left wrist available for review in PACS.     Patient presents in follow-up.  He was last seen in the office on 06/25/2024.  He was immobilized in a Velcro strap wrist brace and has been doing well.  He has noticed improvement in his discomfort, however with certain movements has diffuse discomfort at the wrist.  No new complaints or concerns.  X-rays of the left wrist obtained today.    Follow-up (Velcro strap wrist brace given on 06/25/24. Left wrist is getting better. No numbness. Has pain if he overworks his wrist.)       Vitals:  Ht 1.791 m (5' 10.5)   Wt 74.8 kg (165 lb)   BMI 23.34 kg/m    Body mass index is 23.34 kg/m.    Allergies   Allergen Reactions    Latex Rash     redness       Current Outpatient Medications   Medication Sig Dispense Refill    Cholecalciferol (VITAMIN D-3 PO) Take by mouth      levothyroxine  (SYNTHROID ) 100 MCG tablet Take 1 tablet by mouth Daily 90 tablet 3    clomiPRAMINE (ANAFRANIL) 50 MG capsule Take 3 capsules by mouth      clonazePAM (KLONOPIN) 1 MG tablet Take 1 tablet by mouth 2 times daily.       FLUoxetine (PROZAC) 20 MG capsule Take 1 capsule by mouth daily      acetaminophen  (TYLENOL ) 500 MG tablet Take 2 tablets by mouth every 6 hours as needed for Pain (Patient not taking: Reported on 07/16/2024) 80 tablet 0     No current facility-administered medications for this visit.       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    Lumbar radiculopathy 08/10/2012    S/p steroid injection by Dr Darra 08/01/12     Normal cardiac stress test 09/05/11    Pleural effusion, bilateral 01/21/2019  Pulmonary edema 01/21/2019    Sleep disturbances     acting out in his sleep    Thrombocytopenia 01/21/2019        Past Surgical History:   Procedure Laterality Date    AV FISTULA CREATION Left 07/24/2020    left AVF    CHOLECYSTECTOMY  12/05/2011    abnormal HIDA scan (EF < 2%)    COLONOSCOPY  07/12/2010    hyperplastic polyp, repeat 10 yrs    COLONOSCOPY  02/23/2015    normal    CYST REMOVAL Right 01/06/2016    3rd toe ganglion cyst removal - podiatry (Dr MARLA)    HEENT Left 06/2012    tonsilar bx: benign tissue (Dr Faythe)    ORTHOPEDIC SURGERY      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58    OTHER SURGICAL HISTORY  11/26/2020    L stereotactic focused US  thalamotomy for ET    OTHER SURGICAL HISTORY  08/2022    R stereotactic focused US  thalamotomy for ET    SHOULDER ARTHROSCOPY Right 08/13/2018    TESTICLE REMOVAL Left 2002    TURP  10/28/2020    @ [name]    UPPER GASTROINTESTINAL ENDOSCOPY  09/09/2011    EGD- normal       Family History   Problem  Relation Age of Onset    No Known Problems Mother     Parkinsonism Father     Osteoarthritis Father         scoliosis    Dementia Father     Cancer Paternal Grandfather         prostate    No Known Problems Brother     No Known Problems Brother     Cancer Maternal Grandfather         prostate    No Known Problems Sister         Social History     Tobacco Use    Smoking status: Never    Smokeless tobacco: Never   Substance Use Topics    Alcohol use: Not Currently    Drug use: No        Review of Systems    Constitutional: No fevers, chills, night sweats, excessive fatigue or weight loss.  Musculoskeletal: + Left wrist injury   Neurologic: No headache, blurred vision, and no areas of focal weakness or numbness. Normal gait. No sensory problems.  Respiratory: No dyspnea on exertion, orthopnea, chest pain, cough or hemoptysis.  Cardiovascular: No anginal chest pain, irregular heart beat, tachycardia, palpitations or orthopnea  Integumentary: No chronic rashes, inflammation, ulcerations, pruritus, petechiae, purpura, ecchymoses, or skin changes      Physical Exam    General: Alert, cooperative, no distress  Musculosketal:  Left hand - Full range of motion.  Normal sensation.  No erythema, edema or warmth to the joints.  No cysts.  Light ecchymosis at the volar aspect.  Skin intact.  Left wrist - Full range of motion.  Normal sensation.  No erythema, edema or warmth to the joints.  No palpable cysts. Mild tenderness to palpation at the fourth dorsal compartment. No swelling. Skin intact.  Neurologic:  CNII-XII intact, Normal strength, sensation, and reflexes throughout    Xray Result (most recent):  XR WRIST LEFT (MIN 3 VIEWS) 07/16/2024    Narrative  AP, lateral and oblique views of the left wrist reveal routine healing of the triquetral chip fracture and what is noted today as compared to previous x-ray from the emergency department  is a die punch fracture of the distal radius with routine healing. Good alignment on  lateral view.       ASSESSMENT/PLAN:  Below is the assessment and plan developed based on review of pertinent history, physical exam, labs, studies, and medications.    Left wrist pain.  On 06/19/2024, he was standing on a swivel chair at home dealing with his smoke detector when he fell landing on his left outstretched hand onto some wood flooring.  He presented to the ED where he underwent x-rays of the left wrist which revealed a triquetral chip fracture. He was immobilized in a thumb spica Velcro strap wrist brace.  Patient undergoes dialysis and after the injury, the left upper extremity fistula for dialysis treatment was nonfunctional.  He presented to the ED on 06/21/2024 with this issue.  An appointment was made for 06/24/24 to see the vascular surgeon for repair which he did and is doing well with that.left wrist pain.  On 06/19/2024, he was standing on a swivel chair at home dealing with his smoke detector when he fell landing on his left outstretched hand onto some wood flooring.  He presented to the ED where he underwent x-rays of the left wrist which revealed a triquetral chip fracture. He was immobilized in a thumb spica Velcro strap wrist brace.  Patient undergoes dialysis and after the injury, the left upper extremity fistula for dialysis treatment was nonfunctional.  He presented to the ED on 06/21/2024 with this issue.  An appointment was made for 06/24/24 to see the vascular surgeon for repair which he did and is doing well with that.  Outside images and notes independently reviewed and interpreted by myself.  Clinical exam reveals a left wrist triquetral chip fracture.  He was transitioned to a Velcro strap wrist brace without the thumb for ease of ADLs.  He will follow-up in the office in 3 weeks to review his progress and repeat x-rays.  Return sooner as needed.      Patient presents in follow-up.  He was last seen in the office on 06/25/2024.  He was immobilized in a Velcro strap wrist brace and  has been doing well.  He has noticed improvement in his discomfort, however with certain movements has diffuse discomfort at the wrist.  Clinical exam and images reveal routine healing of the left wrist triquetral chip fracture and the previously unknown distal radius die punch fracture.  He will continue with the brace as needed, but will start to wean and resume normal activities as tolerated.  He was provided with home exercises to engage in a couple of times daily.  He will follow-up in the office in 3 to 4 weeks to review his progress.  Return sooner as needed.    1. Triquetral chip fracture, left, closed, initial encounter  -     XR WRIST LEFT (MIN 3 VIEWS); Future  2. Closed die punch fracture distal radius, left, with routine healing, subsequent encounter      Return in about 3 weeks (around 08/06/2024).     Dr. Matt was available for immediate consult during this encounter.  An electronic signature was used to authenticate this note.  -- Corean Merck, PA-C   "

## 2024-09-11 NOTE — Telephone Encounter (Signed)
 Patient would like a recommendation for a dermatologist.  He has a scab on his arm from a mole that was removed and he would like it to be checked out.    Kevis Qu - [phone]

## 2024-09-11 NOTE — Telephone Encounter (Addendum)
 Called patient and provided dermatology offices in local area.    [facility]  [address], [place], [place] [address]   Phone: [phone]    [facility] [place], [place]  [address] [place], [place] [address]   Phone: [phone]
